# Patient Record
Sex: Female | Born: 1960
Health system: Southern US, Community
[De-identification: ages and names within clinical notes are randomized; demographics above are authoritative.]

## PROBLEM LIST (undated history)

## (undated) DIAGNOSIS — I4891 Unspecified atrial fibrillation: Secondary | ICD-10-CM

## (undated) DIAGNOSIS — I428 Other cardiomyopathies: Secondary | ICD-10-CM

## (undated) DIAGNOSIS — R0600 Dyspnea, unspecified: Secondary | ICD-10-CM

## (undated) DIAGNOSIS — I10 Essential (primary) hypertension: Secondary | ICD-10-CM

## (undated) DIAGNOSIS — E049 Nontoxic goiter, unspecified: Secondary | ICD-10-CM

## (undated) DIAGNOSIS — I5042 Chronic combined systolic (congestive) and diastolic (congestive) heart failure: Secondary | ICD-10-CM

## (undated) HISTORY — PX: BACK SURGERY: SHX140

## (undated) HISTORY — PX: APPENDECTOMY: SHX54

---

## 1997-05-08 ENCOUNTER — Emergency Department (HOSPITAL_COMMUNITY): Admission: EM | Admit: 1997-05-08 | Discharge: 1997-05-08 | Payer: Self-pay | Admitting: Emergency Medicine

## 1997-05-25 ENCOUNTER — Other Ambulatory Visit: Admission: RE | Admit: 1997-05-25 | Discharge: 1997-05-25 | Payer: Self-pay | Admitting: Family Medicine

## 1997-06-21 ENCOUNTER — Encounter: Admission: RE | Admit: 1997-06-21 | Discharge: 1997-06-21 | Payer: Self-pay | Admitting: Family Medicine

## 1997-06-23 ENCOUNTER — Encounter: Admission: RE | Admit: 1997-06-23 | Discharge: 1997-06-23 | Payer: Self-pay | Admitting: Family Medicine

## 1997-06-30 ENCOUNTER — Encounter: Admission: RE | Admit: 1997-06-30 | Discharge: 1997-06-30 | Payer: Self-pay | Admitting: Family Medicine

## 1997-07-19 ENCOUNTER — Encounter: Admission: RE | Admit: 1997-07-19 | Discharge: 1997-07-19 | Payer: Self-pay | Admitting: Family Medicine

## 1997-08-11 ENCOUNTER — Encounter: Admission: RE | Admit: 1997-08-11 | Discharge: 1997-08-11 | Payer: Self-pay | Admitting: Family Medicine

## 1997-10-01 ENCOUNTER — Encounter: Admission: RE | Admit: 1997-10-01 | Discharge: 1997-10-01 | Payer: Self-pay | Admitting: Family Medicine

## 1998-02-21 ENCOUNTER — Ambulatory Visit (HOSPITAL_BASED_OUTPATIENT_CLINIC_OR_DEPARTMENT_OTHER): Admission: RE | Admit: 1998-02-21 | Discharge: 1998-02-21 | Payer: Self-pay | Admitting: Otolaryngology

## 1999-04-08 ENCOUNTER — Emergency Department (HOSPITAL_COMMUNITY): Admission: EM | Admit: 1999-04-08 | Discharge: 1999-04-08 | Payer: Self-pay | Admitting: Emergency Medicine

## 1999-08-23 ENCOUNTER — Emergency Department (HOSPITAL_COMMUNITY): Admission: EM | Admit: 1999-08-23 | Discharge: 1999-08-23 | Payer: Self-pay | Admitting: Emergency Medicine

## 1999-09-13 ENCOUNTER — Ambulatory Visit (HOSPITAL_BASED_OUTPATIENT_CLINIC_OR_DEPARTMENT_OTHER): Admission: RE | Admit: 1999-09-13 | Discharge: 1999-09-13 | Payer: Self-pay

## 2000-08-26 ENCOUNTER — Emergency Department (HOSPITAL_COMMUNITY): Admission: EM | Admit: 2000-08-26 | Discharge: 2000-08-26 | Payer: Self-pay | Admitting: Emergency Medicine

## 2001-03-13 ENCOUNTER — Emergency Department (HOSPITAL_COMMUNITY): Admission: EM | Admit: 2001-03-13 | Discharge: 2001-03-13 | Payer: Self-pay | Admitting: Emergency Medicine

## 2001-03-13 ENCOUNTER — Encounter: Payer: Self-pay | Admitting: Emergency Medicine

## 2002-02-11 ENCOUNTER — Emergency Department (HOSPITAL_COMMUNITY): Admission: EM | Admit: 2002-02-11 | Discharge: 2002-02-11 | Payer: Self-pay | Admitting: Emergency Medicine

## 2003-04-04 ENCOUNTER — Emergency Department (HOSPITAL_COMMUNITY): Admission: EM | Admit: 2003-04-04 | Discharge: 2003-04-04 | Payer: Self-pay | Admitting: Emergency Medicine

## 2005-08-29 ENCOUNTER — Ambulatory Visit: Payer: Self-pay | Admitting: Internal Medicine

## 2005-08-29 ENCOUNTER — Inpatient Hospital Stay (HOSPITAL_COMMUNITY): Admission: EM | Admit: 2005-08-29 | Discharge: 2005-09-01 | Payer: Self-pay | Admitting: Emergency Medicine

## 2005-08-29 ENCOUNTER — Ambulatory Visit: Payer: Self-pay | Admitting: Pulmonary Disease

## 2005-08-29 ENCOUNTER — Encounter: Payer: Self-pay | Admitting: Cardiovascular Disease

## 2005-08-31 ENCOUNTER — Encounter: Payer: Self-pay | Admitting: Pulmonary Disease

## 2005-09-12 ENCOUNTER — Ambulatory Visit: Payer: Self-pay | Admitting: Cardiology

## 2005-10-04 ENCOUNTER — Ambulatory Visit: Payer: Self-pay | Admitting: Internal Medicine

## 2005-10-29 ENCOUNTER — Ambulatory Visit: Payer: Self-pay | Admitting: Internal Medicine

## 2005-12-28 ENCOUNTER — Ambulatory Visit: Payer: Self-pay | Admitting: Internal Medicine

## 2006-02-27 ENCOUNTER — Ambulatory Visit: Payer: Self-pay | Admitting: Internal Medicine

## 2007-06-06 ENCOUNTER — Ambulatory Visit: Payer: Self-pay | Admitting: Internal Medicine

## 2007-09-15 ENCOUNTER — Ambulatory Visit: Payer: Self-pay | Admitting: Internal Medicine

## 2007-09-15 ENCOUNTER — Ambulatory Visit (HOSPITAL_COMMUNITY): Admission: RE | Admit: 2007-09-15 | Discharge: 2007-09-15 | Payer: Self-pay | Admitting: Internal Medicine

## 2007-09-15 ENCOUNTER — Ambulatory Visit: Payer: Self-pay

## 2007-09-15 ENCOUNTER — Encounter: Payer: Self-pay | Admitting: Internal Medicine

## 2007-09-15 LAB — CONVERTED CEMR LAB
BUN: 11 mg/dL (ref 6–23)
Bilirubin, Direct: 0.1 mg/dL (ref 0.0–0.3)
CO2: 29 meq/L (ref 19–32)
Calcium: 9.2 mg/dL (ref 8.4–10.5)
Chloride: 112 meq/L (ref 96–112)
Free T4: 0.7 ng/dL (ref 0.6–1.6)
Glucose, Bld: 107 mg/dL — ABNORMAL HIGH (ref 70–99)
HDL: 41.6 mg/dL (ref >39.0)
LDL Cholesterol: 112 mg/dL — ABNORMAL HIGH (ref 0–99)
Pro B Natriuretic peptide (BNP): 76 pg/mL (ref 0.0–100.0)
Sodium: 144 meq/L (ref 135–145)
TSH: 0.62 microintl units/mL (ref 0.35–5.50)
Total CHOL/HDL Ratio: 4
VLDL: 12 mg/dL (ref 0–40)

## 2008-02-17 ENCOUNTER — Ambulatory Visit: Payer: Self-pay | Admitting: Internal Medicine

## 2008-07-22 ENCOUNTER — Encounter (INDEPENDENT_AMBULATORY_CARE_PROVIDER_SITE_OTHER): Payer: Self-pay | Admitting: *Deleted

## 2008-10-15 ENCOUNTER — Encounter (INDEPENDENT_AMBULATORY_CARE_PROVIDER_SITE_OTHER): Payer: Self-pay | Admitting: *Deleted

## 2010-06-20 NOTE — Assessment & Plan Note (Signed)
Joanna [name]                            CARDIOLOGY OFFICE NOTE   Joanna [name], Joanna [name]                      MRN:          [id number]  DATE:06/06/2007                            DOB:          12-25-1960    PRIMARY CARE PHYSICIAN:  None.   INTERVAL HISTORY:  Joanna [name] is a 50 year old woman with a history of  nonischemic cardiomyopathy with an EF of 30-35% with normal coronaries  on catheterization.  She also has a history of obesity, hypertension and  ongoing tobacco use.   She presents to the clinic today after not being seen here for over a  year-and-a-half due to multiple missed appointments.  We have previously  also scheduled her for an echocardiogram which, unfortunately, she  cancelled that appointment.   She is working as a Engineer, site taking some CNA classes.  She  denies any chest pain.  She says she can walk around at work with no  significant difficulty but if she tries to go too fast or up any hills  she does get short of breath.  She has not any orthopnea or PND.  She  has been having recently a little bit of lower extremity edema because  she ran out of her Lasix.  No chest pain or palpitations.  She is  smoking half a pack of cigarettes a day.   CURRENT MEDICATIONS:  1. Bisoprolol/hydrochlorothiazide 10/6.25.  2. Potassium 20 b.i.d.  3. Lisinopril 40 a day.  4. Lasix 40 a day - been out for 2 days.  5. Multivitamin.   PHYSICAL EXAMINATION:  She is in no acute distress, ambulates around the  clinic without any respiratory difficulty.  Blood pressure is 142/96,  heart rate is 82, weight is 264 which is up 27 pounds from when we last  saw her.  HEENT:  Normal.  NECK:  Supple.  There is no JVD.  Carotids are 2+ bilaterally without  any bruits.  There is no lymphadenopathy.  She appears to have bilateral  thyroid fullness consistent with a goiter.  CARDIAC:  PMI is nondisplaced.  Regular rate and rhythm.  No murmurs,  rubs or  gallops.  LUNGS:  Clear.  ABDOMEN:  Obese, nontender, nondistended.  No hepatosplenomegaly, no  bruits, no mass.  EXTREMITIES:  Warm with no cyanosis or clubbing, 1+ edema.  She does  have healed excoriations from scratching.  No open sores.  Good distal  pulses.  NEURO:  Alert and x3.  Cranial nerves II-XII are intact.  Moves all four  extremities without difficulty.  Affect is pleasant.   EKG shows sinus rhythm at a rate 82 with occasional PVCs.  No ST-T wave  abnormalities.   ASSESSMENT:  1. History of congestive heart failure secondary to nonischemic      cardiomyopathy.  Last assessment of ejection fraction was a couple      of years ago.  I suggested that we repeat her echocardiogram but      she is unsure of her insurance status and does not want to proceed      with this as of  yet.  Clinically, she is NYHA class II with decent      volume status.  We will switch her bisoprolol over to Coreg 12.5      b.i.d. to see if this helps with her blood pressure, continue her      ACE inhibitor and diuretic.  2. Hypertension.  This is suboptimally controlled.  We are      \switching her bisoprolol over to Coreg.  3. Goiter.  I have suggested we get a thyroid ultrasound and a thyroid      panel, but once again she wants to confirm her insurance status.  4. Tobacco use.  I warned her of the problems of smoking and suggested      she quit.   DISPOSITION:  I am quite concerned about [name].  She is not seeking  any primary care follow-up and she has not been consistent with her  follow-up here.  We have suggested that she have some blood work and get  an ultrasound of both at her heart and her thyroid.  She will get back  to [place], she said, when she is ready to do this.     Joanna [name]. Bensimhon, MD  Electronically Signed    DRB/MedQ  DD: 06/06/2007  DT: 06/06/2007  Job #: [id number]

## 2010-06-20 NOTE — Assessment & Plan Note (Signed)
[facility]                            CARDIOLOGY OFFICE NOTE   Joanna [name], Joanna [name]                      MRN:          [id number]  DATE:02/17/2008                            DOB:          May 21, 1960    INTERVAL HISTORY:  Joanna [name] is a 50 year old woman with history of  congestive heart failure secondary to nonischemic cardiomyopathy.  Initial ejection fraction was 30-35% with normal coronaries on  catheterization.  Repeat echocardiogram done in August 2009 showed an EF  of 45% with mildly dilated ventricle.  She also has history of obesity,  hypertension, and ongoing tobacco use.  She has missed multiple  appointments.   The last time we saw her was in May 2009.  She returns today for routine  followup.   Overall, she is doing okay.  She is able to ambulate without significant  difficulty.  She does get short of breath if she does goes too fast.  She has not had any chest pain.  She has lost some weight.  No  orthopnea.  No PND.  No lower extremity edema.  She is smoking half a  pack of cigarettes a day.   On review of systems, she denies any palpitations.  No syncope or  presyncope.  No focal neurologic problems.  Remainder of review of  systems is negative except for HPI and problem list.   CURRENT MEDICATIONS:  1. Lisinopril 40 b.i.d.  2. Coreg 12.55 b.i.d.  3. Multivitamin.   PHYSICAL EXAMINATION:  GENERAL:  She is no acute distress.  She  ambulates around the clinic without any respiratory difficulty.  VITAL SIGNS:  Blood pressure is 160/100, heart rate 75, weight is 244  which is down 20 pounds.  HEENT:  Normal.  NECK:  Supple.  No JVD.  Carotids are 2+ bilaterally without any bruits.  There is no lymphadenopathy.  Question of mild thyroid fullness.  CARDIAC:  PMI is nondisplaced.  Regular rate and rhythm.  No murmurs,  rubs, or gallops.  LUNGS:  Clear.  ABDOMEN:  Obese, nontender, nondistended.  No hepatosplenomegaly.  No  bruits.   No mass.  EXTREMITIES:  Warm with no cyanosis, clubbing, or edema.  Good distal  pulses.  NEUROLOGIC:  Alert and oriented x3.  Cranial nerves II through XII are  intact.  Moves all 4 extremities without difficulty.  Affect is  pleasant.  EKG shows sinus rhythm at a rate of 75 with occasional PVCs.   ASSESSMENT:  1. Congestive heart failure secondary to nonischemic cardiomyopathy.      She is New York Heart Association class II with good volume status.      We will increase her Coreg to 25 b.i.d.  2. Hypertension.  This is suboptimally controlled with doubling her      Coreg.  I think she will probably need spironolactone in the      future.  3. Tobacco use.  I counseled her on the need to stop smoking.  4. Possible goiter.  I discussed this with her previously that she      should get a primary care  physician and consider a thyroid workup.   DISPOSITION:  Return to clinic in 3 months.     [name], MD  Electronically Signed    DRB/MedQ  DD: 02/17/2008  DT: 02/17/2008  Job #: [id number]

## 2010-06-23 NOTE — Assessment & Plan Note (Signed)
[facility]                            CARDIOLOGY OFFICE NOTE   Joanna [name], Joanna [name]                      MRN:          [id number]  DATE:02/27/2006                            DOB:          10/25/1960    PATIENT IDENTIFICATION:  Joanna [name] is a very pleasant 50 year old  woman with non-ischemic cardiomyopathy, an EF of 30-35%, with normal  coronaries, who presents for routine followup.  She also has a history  of hypertension.   Joanna [name] returns today for routine followup.  She says she is doing quite  well.  She is ambulating without an difficulty, denies any chest pain,  shortness of breath.  No orthopnea, no PND, no lower extremity edema.  She says she has been taking all her medications as scheduled, except  every once in a while, she misses her afternoon Lasix dose.   PAST MEDICAL HISTORY:   MEDICATIONS:  1. Lasix 40 b.i.d.  2. Multivitamin.  3. Bisoprolol/HCTZ 10/6.25.  4. Potassium 20 b.i.d.  5. Lisinopril 40 once a day.   PHYSICAL EXAMINATION:  She is well-appearing, ambulates around the  clinic without any respiratory difficulty.  Blood pressure is 116/79,  heart rate is 64, weight is 237.  HEENT:  Sclerae are anicteric.  EOMI.  There are no xanthelasmas.  Mucous membranes are moist.  NECK:  Supple, no JVD.  Carotids are 2+ bilaterally, without any bruits.  There is no lymphadenopathy.  Question of mild thyroid fullness.  CARDIAC:  Regular rate and rhythm, no murmurs, rubs or gallops.  LUNGS:  Clear.  ABDOMEN:  Obese, nontender, nondistended.  No obvious  hepatosplenomegaly, no bruits, no masses.  EXTREMITIES:  Warm with no cyanosis, clubbing or edema.  Good distal  pulses, no rashes.  NEUROLOGIC:  She is alert and oriented times three.  Cranial nerves II  through XII are intact.  Moves all four extremities without difficulty.   The EKG shows normal sinus rhythm with nonspecific STT-waves inferiorly,  which is nonspecific, but is  new from previous.   ASSESSMENT AND PLAN:  Non-ischemic cardiomyopathy.  She is euvolemic,  despite the fact that her weight is up.  She is on good medicines and  her functional capacity is excellent.  I suspect her EF may have  recovered.  I have asked her to decrease her Lasix to 40 mg once a day  and also decrease her potassium.  She can take an extra dose as needed.  We will repeat her echocardiogram to look for LV function recovery.   DISPOSITION:  We will see her back in several months for routine  followup.     Joanna [name]. Bensimhon, MD  Electronically Signed    DRB/MedQ  DD: 02/27/2006  DT: 02/27/2006  Job #: [id number]

## 2010-06-23 NOTE — Discharge Summary (Signed)
NAME:  Joanna [name], Joanna [name] NO.:  [id number]   MEDICAL RECORD NO.:  [id number]          PATIENT TYPE:  INP   LOCATION:  3315                         FACILITY:  [facility]   PHYSICIAN:  Willa Rough, M.D.     DATE OF BIRTH:  09-09-1960   DATE OF ADMISSION:  08/30/2005  DATE OF DISCHARGE:  09/01/2005                                 DISCHARGE SUMMARY   PRIMARY CARDIOLOGIST:  Dr. Arvilla Meres.   PRIMARY CARE PHYSICIAN:  The patient does not have a primary care physician.   PRINCIPAL DIAGNOSIS:  Acute systolic congestive heart failure.   SECONDARY DIAGNOSES:  1.  Nonischemic cardiomyopathy.  2.  Hypertension.  3.  Obesity.   ALLERGIES:  No known drug allergies.   PROCEDURE:  Right and left heart cardiac catheterization and 2-D  echocardiogram.   HISTORY OF PRESENT ILLNESS:  A 50 year old African-American female with no  prior history of coronary artery disease who presented to [facility] on August 29, 2005 with complaints of a three-week history of  worsening dyspnea on exertion and orthopnea.  In the [facility] ED, she was  treated with IV Lasix with improvement and admitted to the ICU by the  primary care service.  Cardiology was subsequently asked to consult after 2-  D echocardiogram revealed a new diagnosis of LV systolic dysfunction with an  EF 30-35%.  Decision was made to transfer to [facility] for further  evaluation.   HOSPITAL COURSE:  We took over the care of Joanna [name] following arrival to  [facility] and she continued to diurese with IV Lasix.  Lasix was switched  over to an oral regimen on August 30, 2005 and she underwent left heart  cardiac catheterization on August 31, 2005 revealing normal coronary arteries  with an EF of 25-30%.  It was noted that her LV was dilated.  Right heart  catheterization revealed moderately elevated right heart pressures with a PA  33/16 with a mean of 24 and a wedge of 25.  Cardiac output was 9.4 with an  index  of 4.2.  It was felt that she likely suffered from hypertensive  nonischemic cardiomyopathy in the absence of coronary disease and she has  been medically managed with beta blocker, ACE inhibitor and Lasix therapy.  Through medications, we have been able to control her blood pressure and  improve her symptomatically.  She has been counseled on the importance of  sodium restriction as well as medical adherence and daily weights.  She is  being discharged home today in satisfactory condition.   DISCHARGE LABS:  Hemoglobin 12.1, hematocrit 36.6, WBC 6.8, platelets 158.  Sodium 142, potassium 3.3 (replaced prior to discharge), chloride 108, CO2  26, BUN 15, creatinine 0.8, glucose 115, calcium 9.1, hemoglobin A1c 5.1.  BNP admission was 335, total cholesterol 122, triglycerides 57, HDL 45, LDL  66.   DISPOSITION:  The patient is being discharged home today in good condition.   FOLLOW-UP APPOINTMENTS:  She has a follow-up appointment with Dr.  Prescott Gum P.A. on September 12, 2005 at 2:15 p.m.  She has  a follow-up in  [facility] cardiology CHF clinic on October 04, 2005 at 2:30 p.m.   DISCHARGE MEDICATIONS:  1.  Lasix 40 mg b.i.d.  2.  Lisinopril 40 mg daily.  3.  Potassium chloride 20 mEq b.i.d.  4.  Bisoprolol 2.5 mg daily.   Outstanding labs are none.  Duration of discharge encounter is 40 minutes  including physician time.      Ok Anis, NP    ______________________________  Willa Rough, M.D.    CRB/MEDQ  D:  09/01/2005  T:  09/01/2005  Job:  [id number]

## 2010-06-23 NOTE — Assessment & Plan Note (Signed)
[facility]                              CARDIOLOGY OFFICE NOTE   Joanna, [name]                      MRN:          [id number]  DATE:10/29/2005                            DOB:          1960/05/01    PATIENT IDENTIFICATION:  Joanna [name] is a very pleasant 50 year old woman  who returns for routine followup of her heart failure.   PAST MEDICAL HISTORY:  1. Congestive heart failure secondary to nonischemic cardiomyopathy,      presumed hypertensive.  Cardiac catheterization showed an EF of 30-35%      with normal coronary arteries.  2. Hypertension.  3. Obesity.  4. Tobacco use, four cigarettes per day.  5. Status post back surgery.   CURRENT MEDICATIONS:  1. Lasix 40 b.i.d.  2. Bisoprolol 10/hydrochlorothiazide 6.25 a day.  3. Potassium 20 mEq b.i.d.  4. Lisinopril 40 a day.  5. Benazepril 40 a day.  6. Multivitamin.   ALLERGIES:  No known drug allergies.   INTERVAL HISTORY:  Joanna [name] returns today for routine followup.  She says  she is doing great.  She is back to work.  She denies any chest pain or  shortness of breath.  She has not had any heart failure symptoms at all.  There is no orthopnea, PND, or lower extremity edema.  She denies any  palpitations, syncope, or presyncope.   PHYSICAL EXAMINATION:  GENERAL:  She is well-appearing, ambulates around the  clinic without any difficulty.  VITAL SIGNS:  Blood pressure is 116/84, heart rate 65, weight is 228.  HEENT:  Sclerae anicteric, EOMI.  There is no xanthelasmas.  Mucous  membranes are moist.  NECK:  Supple.  There is no JVD.  Carotids are 2+ bilaterally without any  bruits.  There is no lymphadenopathy or thyromegaly.  CARDIAC:  Regular rate and rhythm, no S3.  LUNGS:  Clear.  ABDOMEN:  Obese, nontender, nondistended.  No obvious hepatosplenomegaly.  No bruits, no masses.  EXTREMITIES:  Warm with no cyanosis, clubbing or edema.  Strong distal  pulses.  NEUROLOGIC:   Alert and oriented x3.  Cranial nerves II-XII are grossly  intact.  Moves all four extremities without difficulty.   ASSESSMENT AND PLAN:  Congestive heart failure secondary to nonischemic  cardiomyopathy.  She is currently NYHA class I.  She is doing very well.  She is euvolemic.  Unfortunately, she is now taking two ACE inhibitors so I  have asked her to stop the benazepril.  I suspect we may be able to come  down on her diuretics a bit in the near future.  Will see her back in 3  weeks for further evaluation.  At that time we will check electrolytes as  well as a BNP.  She will also need a followup echocardiogram in several  months to look for recovery of her LV function.  If is has not recovered we  may need to consider a defibrillator placement.  [name], MD    DRB/MedQ  DD:  10/29/2005  DT:  10/31/2005  Job #:  [id number]

## 2010-06-23 NOTE — Assessment & Plan Note (Signed)
[facility]                   COUMADIN / CHRONIC HEART FAILURE CLINIC NOTE   LORAL, CAMPI                      MRN:          [id number]  DATE:10/04/2005                            DOB:          [birth year]    Ms. Bourn is a 50 year old African-American female new to the heart  failure clinic.  She is here following a recent hospitalization for new  diagnosis of congestive heart failure secondary to non-ischemic  cardiomyopathy in setting of uncontrolled hypertension.  Ms. Luepke  underwent a cardiac catheterization (right and left heart cath) by Dr. Nicholes Mango during that hospitalization, showing normal coronary arteries with  moderate-to-severe left ventricular dysfunction, suspected hypertensive  cardiomyopathy.  Patient also had a 2.D echocardiogram done at that time  showing ejection fraction of 30 to 35% with moderate diffuse left  ventricular hypokinesis.  Ms. Motz was discharged home on 40 of Lasix  b.i.d., 40 of lisinopril, and 2.5 of metoprolol, along with potassium.  She  was Dr. Tereso Newcomer for initial post hospitalization visit on September 12, 2005.  Patient was found to be in stable condition at that time.  There was  concern about her returning to work.  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  She has not been back to work since that time.  She complains of ongoing  generalized fatigue and dyspnea on exertion.  No resting shortness of  breath; however, she continues to smoke approximately she states less than a  pack per day, which has been doing so for many years.  She states she also  has been told in the past that she had high blood pressure, but has never  gotten her prescription filled.  Ms. Yaden states compliance with her  medications since she was discharged home.  Her only complaint at this time  is the generalized fatigue and  weakness.  Denies any orthopnea, paroxysmal  nocturnal dyspnea, peripheral edema, or chest discomfort.   PAST MEDICAL HISTORY:  Includes:  1. Congestive heart failure new onset secondary to non-ischemic      cardiomyopathy with an EF of 30 to 35% in the setting of uncontrolled      hypertension.  2. Normal coronary arteries by catheterization.  3. Hypertension.  4. Obesity.  5. Ongoing tobacco abuse.  6. Appendectomy  7. Status post back surgery.   REVIEW OF SYMPTOMS:  As stated above in history of present illness.  Otherwise negative.   CURRENT MEDICATIONS:  Include:  1. Furosemide 40 mg p.o. b.i.d.  2. Kay Ciel 20 mEq b.i.d.  3. Lisinopril 40 mg daily.  4. Metoprolol 5 mg daily.   PHYSICAL EXAMINATION:  VITAL SIGNS:  Weight 230 pound, blood pressure 164/98  with a pulse of 76.  GENERAL:  Ms, Klas is a pleasant 50 year old African-American female in  no acute distress.  HEENT:  She is normocephalic, atraumatic.  Pupils equal, round, reactive to  light.  Sclerae is clear.  Neck supple without lymphadenopathy.  Negative  bruits.  Jugular venous distention  approximately 5 to 6 centimeters.  CARDIOVASCULAR:  S1, S2, soft S4.  LUNGS:  Distant breath sounds bilateral bases, otherwise clear to  auscultation.  ABDOMEN:  Soft, nontender.  Positive bowel sounds.  LOWER EXTREMITIES:  Without clubbing, cyanosis, or edema.  NEUROLOGICALLY:  Patient alert and oriented times 3.  Cranial nerves II  through XII grossly intact.   IMPRESSION:  Stable class II to early class III heart failure.  Patient  hypertensive today.  I am going to have her increase her metoprolol to 10 mg  daily.  I have also written her a new prescription for this.  She is  concerned about her financial ability to obtain her medicines. I have given  her new prescriptions so that she can get her medications filled at [facility].  Unfortunately the metoprolol only comes with the HCTZ so we will be  adding 6.25 mg of  the hydrochlorothiazide to her metoprolol.  However, I  think patient will tolerate this adjustment.  I am going to have her return  in 4 weeks for further evaluation.  Will not check blood work at this time,  as she just had lab work at the beginning of this month, and blood urea  nitrogen, creatinine, potassium were stable at that time.  I have given  patient a congestive heart failure information packet regarding, and we have  discussed at length diet, fluid restriction, daily weights, the importance  of medication and when to call our office.  Patient verbalizes  understanding, although I am not sure that she mentally is capable of  grasping the importance of all of this information at this time.  Hopefully,  she will review some of the written literature I have given her.                                 Dorian Pod, ACNP    MB/MedQ  DD:  10/04/2005  DT:  10/05/2005  Job #:  [id number]

## 2010-06-23 NOTE — Assessment & Plan Note (Signed)
[facility]                              CARDIOLOGY OFFICE NOTE   MAEBY, VANKLEECK                      MRN:          [id number]  DATE:09/12/2005                            DOB:          1960/05/08    Joanna [name] is a very pleasant 50 year old female patient with no  previously known coronary artery disease who presented to the hospital on  August 30, 2005 with congestive heart failure.  She had an EF of 30-35% by  echocardiogram.  She underwent cardiac catheterization which revealed normal  coronary arteries.  She was diagnosed with nonischemic cardiomyopathy.  She  had her medications adjusted, and she was discharged home on September 01, 2005.  The patient returns to the office today for follow-up.  She is doing well  without shortness of breath with exertion.  She denies any orthopnea or  paroxysmal nocturnal dyspnea.  She denies any syncope, pre syncope, or chest  pain.  She does note that she is quite fatigued.  She is somewhat concerned  about going back to work.   CURRENT MEDICATIONS:  1.  Furosemide 40 mg b.i.d.  2.  Potassium 20 mEq b.i.d.  3.  Lisinopril 40 mg daily.  4.  Bisoprolol 5 mg half tablet daily.  5.  Multivitamin daily.   ALLERGIES:  No known drug allergies.   PHYSICAL EXAMINATION:  GENERAL:  She is a well-nourished, well-developed  female in no acute distress.  VITAL SIGNS:  Blood pressure is 142/79.  Pulse is 70.  Weight is 228 pounds.  HEENT:  Unremarkable.  NECK:  Without JVD.  CARDIAC:  S1 and S2.  Regular rate and rhythm without murmurs, clicks, rubs,  or gallops.  LUNGS:  Clear to auscultation bilaterally without wheezing, rhonchi, or  rales.  ABDOMEN:  Soft and nontender with normoactive bowel sounds.  No  organomegaly.  EXTREMITIES:  Without edema.  Calves are soft and nontender.  Right groin  without hematomas or bruits.   Electrocardiogram reveals sinus rhythm with a heart rate of 70.  Normal  axis.   Nonspecific ST/T-wave changes.  Occasional PVC.   IMPRESSION:  1.  Chronic systolic congestive heart failure.  2.  Nonischemic cardiomyopathy with an ejection fraction of 30-35%.  3.  Normal coronary arteries by catheterization.  4.  Hypertension.  5.  Obesity.  6.  Tobacco abuse.   PLAN:  The patient did well from a cardiovascular standpoint.  She continues  to be euvolemic on exam and is tolerating her medications well.  She has  been walking quite a bit.  She actually walks to a convenience store several  times a week, and this is a 30-minute walk one way.  She does fine with  this, aside from the fact of being fatigued.  She is still somewhat  concerned about going back to work.  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  I think it is reasonable to allow  her to stay out of work until she follows up with the CHF clinic later this  month.  We will continue to try to titrate her medications and see if we can  improve her symptomatology.  If it is improved by the time of her follow-up,  we could certainly release her to go back to work.  I have increased her  bisoprolol today to 5 mg a day.  We will check a BMET today to follow up on  her renal function and potassium.  She will follow up with Dr. Teena Dunk later  this month as scheduled.                                  Tereso Newcomer, PA-C                            Jesse Sans. Daleen Squibb, MD, [facility]   SW/MedQ  DD:  09/12/2005  DT:  09/12/2005  Job #:  [id number]

## 2010-06-23 NOTE — Op Note (Signed)
[place]. [facility]  Patient:    Joanna [name], Joanna [name]                      MRN: [id number] Proc. Date: 09/13/99 Adm. Date:  [id number] Attending:  Meredith Leeds                           Operative Report  PREOPERATIVE DIAGNOSIS:  Anal fistula.  POSTOPERATIVE DIAGNOSIS:  Anal fistula.  OPERATION:  Anal fistulectomy.  SURGEON:  Zigmund Daniel, M.D.  ANESTHESIA:  General.  PROCEDURE:  After the patient was adequately anesthetized, draped, and positioned, I thoroughly examined the anal area.  There was an opening in the external skin about 2 cm from the anal verge.  I placed a probe into that and it went easily into a crypt in the anus.  I completely opened the fistula using the cautery, and cut away overhanging skin edges to provide good open fistula for healing.  I cauterized the chronic granulation tissue which was present, and I probed for other ramifications of the fistula and did not find any.  I examined the distal rectal mucosa, and the remainder of the anus and found no other abnormalities.  I anesthetized the area thoroughly with long-acting local anesthetic and applied a bandage.  She tolerated the operation well. DD:  09/13/99 TD:  09/13/99 Job: [id number]

## 2010-06-23 NOTE — Cardiovascular Report (Signed)
NAME:  BIRTHA, HATLER NO.:  [id number]   MEDICAL RECORD NO.:  [id number]          PATIENT TYPE:  INP   LOCATION:  3315                         FACILITY:  [facility]   PHYSICIAN:  Arvilla Meres, M.D. LHCDATE OF BIRTH:  February 26, 1960   DATE OF PROCEDURE:  08/31/2005  DATE OF DISCHARGE:                              CARDIAC CATHETERIZATION   IDENTIFICATION:  Ms. Petron is a 50 year old woman with a history of  hypertension who was admitted with respiratory distress.  Echocardiogram  revealed an ejection fraction of approximately 30% with moderate mitral  regurgitation.  She is now referred for diagnostic catheterization to assess  her pressures and also rule out underlying coronary disease.   PROCEDURES PERFORMED:  1.  Right heart catheterization.  2.  Left heart catheterization.  3.  Left left ventriculogram.  4.  Selective coronary angiography.  5.  Angio-Seal closure device.   DESCRIPTION OF PROCEDURE:  Risks and benefits of catheterization were  explained.  Consent was signed and placed in the chart.  Using 1% local  lidocaine, the right groin area was anesthetized.  A 6-French arterial  sheath was placed in the right femoral artery using a modified Seldinger  technique.  Standard catheters including preformed Judkins JL-4, JR-4 and  angled pigtail used for catheterization.  All catheter exchanges were made  over wire.  There were no apparent complications.  A 7-French venous sheath  was placed in the right femoral vein using modified Seldinger technique.  Standard Swan-Ganz catheter was used for the procedure.  At the end the  procedure, the patient had her right femoral arteriotomy site closed with  Angio-Seal closure device, and there was good hemostasis.   HEMODYNAMIC RESULTS:  Right atrial pressure mean of 10, RV pressure 46/9, PA  pressure 33/16 with a mean of 24, pulmonary capillary wedge pressure mean of  25, V-wave equals 35, central aortic pressure  140/102 with a mean of 119.  LV pressure is 141/16 with an EDP of 25.  Fick cardiac output was 9.4 liters  per minute.  Fick cardiac index was 4.2 liters per minute per meter squared.  Pulmonary arterial saturations on room air were 63% and 59%.  Aortic  saturation 89% and 86% on room air.  IVC sat was 60%.   Left main was normal.   LAD was a long vessel wrapping the apex, gave off two moderate-sized  diagonals, and was angiographically normal.  Left circumflex was a large  system and had 2 moderate size OMs and a moderate-sized posterolateral.  They were angiographically normal.  Right coronary was a large dominant  vessel that gave off a RV branch and acute marginal, a moderate size PDA and  a moderate-sized posterolateral angiographically normal.   The left ventriculogram done in the RAO position showed a dilated ventricle  with an EF of 25-30%.  There is apparent 1+ mitral regurgitation.   ASSESSMENT:  1.  Normal coronary arteries.  2.  Moderate to severe left ventricular dysfunction, suspect hypertensive      cardiomyopathy.  3.  Resting hypoxemia after a mild amount of sedative.  4.  Elevated cardiac output without any evidence of shunt by oximetry, mild      mitral regurgitation.   PLAN/DISCUSSION:  1.  I suspect this is a hypertensive cardiomyopathy.  Wedge pressure is      still mildly elevated.  We will focus on titrating her ACE inhibitors,      beta blockers and diuretics.  2.  She will need an ambulatory ABG to evaluate for possible need for home      O2.  3.  She will need aggressive control of her blood pressure.  4.  Probable home in the morning, and she will need close follow-up in CHF      clinic.      Arvilla Meres, M.D. [facility]  Electronically Signed     DB/MEDQ  D:  08/31/2005  T:  08/31/2005  Job:  [id number]

## 2010-06-23 NOTE — H&P (Signed)
NAME:  Joanna [name], Joanna [name] NO.:  [id number]   MEDICAL RECORD NO.:  [id number]          PATIENT TYPE:  EMS   LOCATION:  ED                           FACILITY:  [facility]   PHYSICIAN:  Gailen Shelter, MD  DATE OF BIRTH:  27-Aug-1960   DATE OF ADMISSION:  08/29/2005  DATE OF DISCHARGE:                                HISTORY & PHYSICAL   CHIEF COMPLAINT:  Shortness of breath with associated respiratory distress.   HISTORY OF PRESENT ILLNESS:  Ms. Laatsch is a 50 year old African-American  female who presented to [facility] Emergency Room via EMS after complaining  of dyspnea while at work.  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  She apparently is a smoker.  Currently, on my evaluation, the patient is on  BiPAP and is not able to speak clearly due to the BiPAP.  The patient  apparently called EMS today after noticing acute dyspnea, which was noted  during the course of her work today.  EMS was notified and the patient  apparently was in an apprehensive state with oxygen saturations at 100% on  initial evaluation.  Initially, her chest sounded clear per EMS.  On arrival  to the emergency room at [facility], the patient was noted, however, to  have a blood pressure of 140/106.  She was clearly working hard to breathe  and her oxygen saturation was 86%.  At this point, she was initiated on Solu-  Medrol and continuous albuterol nebulizers, none which helped.  Her blood  gases at that time were 7.14, pCO2 of 61 and a pAO2 of 57.  I was notified  by the emergency room physician that the patient was in almost need of  intubation.  After discussion with the ED physician and discussion of the  findings on chest x-ray, I advised that Lasix be given and that BiPAP be  initiated.  I then presented to the patient's bedside.  The patient is a  somewhat vague historian and again, she is wearing BiPAP, quite diaphoretic,  though appears more comfortable on BiPAP, according to the  ED physician.  No  prodrome of cough, sputum production, chest pain or any other symptomatology  could be elicited from the patient.  This was clearly an acute event.   The patient apparently did give to the emergency room personnel a history of  having been to a physician approximately 2 weeks ago; this was in [place], [place] she resides.  She cannot remember the name of the physician.  She also cannot remember the names of the medications that were given to  her.  Apparently, she received prescriptions for 4 different medications.  She was not sure if one of the medications included an inhaler.   PAST MEDICAL HISTORY:  Essentially unremarkable.  The patient has had an  anal fissure in the past which was surgically repaired, but other than that,  an essentially unremarkable history.   ALLERGIES:  None noted.   CURRENT MEDICATIONS:  The patient is unsure of her medications, but these  have been as recent as 2  weeks previously and the patient has not been  compliant.   SOCIAL HISTORY:  The patient works as a Runner, broadcasting/film/video in a Nurse, adult home.  She is reportedly a smoker, though is evasive about quantitation of the  same.  She denies illicit drug use.   FAMILY HISTORY AND REVIEW OF SYSTEMS:  Cannot be obtained nor elucidated  further.   PHYSICAL EXAM:  GENERAL:  This reveals an obese [place] American female who  is quite diaphoretic, though currently with no increased work of breathing  with assistance of BiPAP.  VITAL SIGNS:  Blood pressure is 140/106, pulse is 120, respiratory rate of  20, oxygen saturation is 96% on BiPAP.  Her current FIO2 is at 50%.  HEENT:  Examination is grossly unremarkable.  NECK:  Supple.  T he patient has full neck veins, but no frank JVD.  LUNGS:  The patient has some end-expiratory wheezes in the upper lobes, but  most remarkably, she has crackles bibasilarly at the bases.  CARDIAC:  Tachycardiac.  She had an S4 gallop, no murmurs noted.  ABDOMEN:   Obese.  No hepatosplenomegaly noted.  EXTREMITIES:  The patient has 1 to 2+ pitting edema.  NEUROLOGIC:  Examination is grossly nonfocal.  SKIN:  Cool, clammy and the patient is profusely diaphoretic.   LABORATORY DATA:  Again, reportedly, arterial blood gas was 7.14, pCO2 of  61, pAO2 of 57.   Chest x-ray revealed interstitial edema with cardiomegaly, globular  silhouette, query pericardial effusion.   CBC revealed a white count of 10.4, H&H of 13.9 and 43.8, platelet count of  230,000; the patient does not have a left shift.  Coags are normal.  BMET  reveals a sodium of 144, potassium of 4.5, chloride of 114, carbon dioxide  of 22, glucose of 223, BUN of 14, creatinine of 0.7.  She has mild  transaminase elevations.  Her GOT is 90, SGPT 52.   EKG reveals a sinus tachycardia with left atrial enlargement, nonspecific ST  wave abnormality.   IMPRESSION:  1. Hypercarbic respiratory failure with acute respiratory distress, sudden      in onset in a patient with pulmonary edema, possibly secondary to      diastolic dysfunction.  2. Diastolic hypertension with likely diastolic dysfunction.  3. Hyperglycemia.  4. Tobacco abuse.   PLAN:  1. Plan will be to admit the patient to the intensive care unit.  We will      place the patient on IV nitroglycerin drip.  The patient will be      initiated on aspirin.  She has already received a dose of Lasix, which      clearly helped her along with BiPAP.  2. We will continue BiPAP and try to wean this off as the patient      tolerates.  3. The patient will have other laboratory data obtained to include cardiac      enzymes, urine drug screen, BNP, thyroid panel, magnesium level,      hemoglobin A1c and a sed rate and LDH.  4. A 2-D echo will be obtained to exclude pericardial effusion and      evaluate for potential left ventricular dysfunction.  5.  We will also     consider a CT scan of the chest if above studies are not helpful.  5. For  now we will hold off on the use of anticoagulants until a      pericardial effusion can be excluded.  6. Further plans  pending the patient's response to the above.   CRITICAL CARE TIME:  Please note that a total of 45 minutes of critical care  time were spent in evaluation and stabilization of this patient.      Gailen Shelter, MD  Electronically Signed    CLG/MEDQ  D:  08/29/2005  T:  08/29/2005  Job:  [phone]

## 2010-06-23 NOTE — Consult Note (Signed)
NAME:  Joanna [name], Joanna [name] NO.:  [id number]   MEDICAL RECORD NO.:  [id number]          PATIENT TYPE:  INP   LOCATION:  0165                         [facility]ACILITY:  [facility]   PHYSICIAN:  Arvilla Meres, M.D. [facility] OF BIRTH:  September 19, 1960   DATE OF CONSULTATION:  08/29/2005  DATE OF DISCHARGE:                                   CONSULTATION   CARDIOLOGY CONSULTATION:   PRIMARY CARE PHYSICIAN:  The patient does not have a primary care physician.   SUMMARY OF HISTORY:  Joanna [name] is a 50 year old African-American female  who was transported via EMS to [facility] Emergency Room secondary to  shortness of breath and was admitted by pulmonary.  Joanna [name] describes at  least a 3-week history of shortness of breath and it is particularly worse  with exertion and with lying down.  She denies any edema.  She is not sure  about weight gain and denies chest discomfort.  She stated that she was seen  approximately 2 weeks ago at [facility] ER and remained overnight.  She is  not sure of her discharge diagnoses or the prescription she was given at the  time of discharge.  She does state that she has not been taking all the  medications since she was discharged.  She states for the first 2 days after  that hospitalization she felt okay however then her symptoms returned and  progressively gotten worse.  Last night while working third shift she stated  that her shortness of breath got so bad to the point that she could not  stand or catch her breath her co-worker called EMS.  Upon arrival EMS found  her to be hypertensive and tachycardic and she was placed on oxygen and  transported to [facility] Emergency Room.  In [facility]  her blood gases showed a pH of 7.14, pCO2 of 61, and a pO2 of 57.  BiPAP and  Lasix were administered with gradual improvement.  She was admitted to the  ICU and we were asked to consult secondary to abnormal echocardiogram.   PAST MEDICAL  HISTORY:   ALLERGIES:  NO KNOWN DRUG ALLERGIES.   MEDICATIONS:  Medications prior to admission include occasional BC Powder.  The patient does not know her prescriptions from [facility] as she  did not fill some of them.   SURGICAL HISTORY:  Her surgical history is notable for appendectomy, back  surgery, anal fistula repair in August 2001 and surgery on her left ear.  She states that she was told she had hypertension at [facility].  She denies  any known history of CVA, myocardial infarction, diabetes, COPD, blood  dyscrasias, , thyroid disorder or cholesterol problems, however, she has not  seen a physician in many years.   SOCIAL HISTORY:  She resides in [place] in an apartment, occasionally her  boyfriend or nephew stays with her.  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  She has one son age 31 alive and well and one  grandchild.  She smokes less than a pack  per day and has been doing so for  20 years, drinks one or two beers per week, denies any drugs, herbal  medications, does not follow a diet nor does she exercise.   FAMILY HISTORY:  Family history is notable for the death of her mother at  the age of 18 with cancer, history of hypertension; her father died in his  79s with heart problems, history of hypertension.  She has three sisters,  four brothers, many have hypertension, one brother has probable heart  problems.   REVIEW OF SYSTEMS:  Review of systems notable for denying a recent viral  illness, positive for reading glasses, positive for coughing and wheezing  without production, nocturia, shoulder and back arthralgias, constipation,  last period was end of June/beginning of July.   PHYSICAL EXAMINATION:  GENERAL:  Well-nourished, well-developed obese  African-American female in no apparent distress.  VITAL SIGNS:  Temperature 98.8, blood pressure 152/90, weight 109.9 kg,  pulse 96, respirations 18, 99% on 2 L.  HEENT:  Unremarkable.   NECK:  Supple without adenopathy.  She does have approximately 8-9 cm of JVD  with a positive hepatojugular reflex.  There is also possible goiter.  HEART:  PMI is not displaced.  Rapid rate and rhythm.  Positive S3 and S4.  She has a very faint 1-2/6 systolic murmur at the apex.  LUNGS:  Symmetrical excursion, diminished breath sounds, do not appreciate  any wheezing or rales.  ABDOMEN:  Obese, bowel sounds present without organomegaly, masses, or  tenderness.  EXTREMITIES:  Negative cyanosis, clubbing, or edema.  Peripheral pulses are  symmetrical and intact.  MUSCULOSKELETAL:  Unremarkable.  NEURO:  Intact.  SKIN:  Skin integument is intact.  She does have two names on her left chest  and a tattoo on her upper back.   X-RAYS:  Chest x-ray showed cardiomegaly, moderate edema.   ELECTROCARDIOGRAM:  EKG on admission showed sinus tachycardia, left axis  deviation with a ventricular rate of 142, baseline artifact, left anterior  fascicular block, early R wave, normal intervals.   LABORATORIES:  H&H were 13.9 and 43.8, normal indices, platelets are 230,  WBC is 10.4.  Sodium 144, potassium 4.5, BUN 14, creatinine 0.7, glucose  223.  AST and ALT were elevated at 90 and 52.  BNP was 335.  ESR 2.  Magnesium 1.8.  CK-MBs were negative x2, troponins were 0.12 and 0.10.  PTT  35, PT 14.1, INR 1.0.  UA was positive for wbc's and protein.  TSH was 0.628  with a T4 of 0.78.  Urine drug screen was negative.  Repeat ABG showed a pH  of 7.35, pCO2 40.7, pO2 102, bicarb 21.7, 97.3% 2 liters.   IMPRESSION:  1.  Congestive heart failure with echocardiogram that shows an ejection      fraction of 30-35% with diffuse hypokinesis, moderate mitral      regurgitation.  2.  Hypertension.  3.  Tobacco use.  4.  Obesity.  5.  Hyperglycemia without history of diabetes however, she did receive Solu-      Medrol in the emergency room.  DISPOSITION:  Dr. Gala Romney reviewed the patient's history, spoke with  and  examined the patient.  I have faxed a waiver to [facility] to  receive information in regards to her recent admission.  We will begin her  on a baby aspirin every day, continue her Lovenox, IV nitroglycerin.  We  will begin Captopril 6.25 mg q.8, Lasix 40 mg p.o.  b.i.d. and a beta blocker  in 1-2 days.  We have set up a right and left heart catheterization for  Friday to further  evaluate her probable dilated cardiomyopathy.  We will also do a thyroid  ultrasound for possible goiter.  Dr. Gala Romney spoke with Dr. Jayme Cloud who  will transfer the patient to our service and we will try to get a step-down  bed at [facility] to facilitate her cardiac evaluation.      Joellyn Rued, P.A. [facility]      Arvilla Meres, M.D. [facility]  Electronically Signed    EW/MEDQ  D:  08/29/2005  T:  08/29/2005  Job:  [id number]

## 2011-06-18 ENCOUNTER — Encounter (HOSPITAL_COMMUNITY): Payer: Self-pay | Admitting: Emergency Medicine

## 2011-06-18 ENCOUNTER — Emergency Department (HOSPITAL_COMMUNITY)
Admission: EM | Admit: 2011-06-18 | Discharge: 2011-06-19 | Disposition: A | Discharge status: Home / Self Care | Payer: Self-pay | Attending: Emergency Medicine | Admitting: Emergency Medicine

## 2011-06-18 DIAGNOSIS — L509 Urticaria, unspecified: Secondary | ICD-10-CM | POA: Insufficient documentation

## 2011-06-18 DIAGNOSIS — L2989 Other pruritus: Secondary | ICD-10-CM | POA: Insufficient documentation

## 2011-06-18 DIAGNOSIS — I10 Essential (primary) hypertension: Secondary | ICD-10-CM | POA: Insufficient documentation

## 2011-06-18 DIAGNOSIS — L298 Other pruritus: Secondary | ICD-10-CM | POA: Insufficient documentation

## 2011-06-18 DIAGNOSIS — T7840XA Allergy, unspecified, initial encounter: Secondary | ICD-10-CM

## 2011-06-18 DIAGNOSIS — F17200 Nicotine dependence, unspecified, uncomplicated: Secondary | ICD-10-CM | POA: Insufficient documentation

## 2011-06-18 HISTORY — DX: Essential (primary) hypertension: I10

## 2011-06-18 NOTE — ED Notes (Signed)
PT. REPORTS GENERALIZED ITCHY RASHES/ HIVES ONSET LAST WEEK , RESPIRATIONS UNLABORED .

## 2011-06-19 MED ORDER — DIPHENHYDRAMINE HCL 25 MG PO TABS: 25.0000 mg | ORAL_TABLET | Freq: Four times a day (QID) | ORAL | Status: DC

## 2011-06-19 MED ORDER — DIPHENHYDRAMINE HCL 25 MG PO CAPS
50.0000 mg | ORAL_CAPSULE | Freq: Once | ORAL | Status: AC
  Administered 2011-06-19: 50 mg via ORAL
  Filled 2011-06-19: qty 2

## 2011-06-19 MED ORDER — HYDROCORTISONE 1 % EX CREA: TOPICAL_CREAM | CUTANEOUS | Status: DC

## 2011-06-19 NOTE — ED Notes (Signed)
Patient is AOx4 and comfortable with her discharge instructions. 

## 2011-06-19 NOTE — Discharge Instructions (Signed)

## 2011-06-19 NOTE — ED Provider Notes (Signed)
History     CSN: [id number]  Arrival date & time 06/18/11  2317   First MD Initiated Contact with Patient 06/19/11 0020      Chief Complaint  Patient presents with  . Urticaria     The history is provided by the patient.   patient reports probably one half weeks of "itchy bumps" all over her body but primarily on her upper extremities and her back.  She recently moved into a hotel about a week and half ago when the symptoms started.  She has no pets.  She is not aware of any insect exposures.  She has no history of asthma or eczema.  Her symptoms are moderate to severe this time.  She's not tried any medications.  She has no other complaints.  Past Medical History  Diagnosis Date  . Hypertension     History reviewed. No pertinent past surgical history.  No family history on file.  History  Substance Use Topics  . Smoking status: Current Everyday Smoker  . Smokeless tobacco: Not on file  . Alcohol Use: Yes    OB History    Grav Para Term Preterm Abortions TAB SAB Ect Mult Living                  Review of Systems  All other systems reviewed and are negative.    Allergies  Review of patient's allergies indicates no known allergies.  Home Medications   Current Outpatient Rx  Name Route Sig Dispense Refill  . DIPHENHYDRAMINE HCL 25 MG PO TABS Oral Take 1 tablet (25 mg total) by mouth every 6 (six) hours. 20 tablet 0  . HYDROCORTISONE 1 % EX CREA  Apply to affected area 2 times daily 15 g 0    BP 185/96  Pulse 73  Temp(Src) 98.4 F (36.9 C) (Oral)  Resp 19  SpO2 98%  LMP 06/10/2011  Physical Exam  Constitutional: She is oriented to person, place, and time. She appears well-developed and well-nourished.  HENT:  Head: Normocephalic.  Eyes: EOM are normal.  Neck: Normal range of motion.  Pulmonary/Chest: Effort normal.  Musculoskeletal: Normal range of motion.  Neurological: She is alert and oriented to person, place, and time.  Skin: Skin is warm and  dry.       Several areas of raised erythematous bumps on her bilateral upper extremities and her back.  Has evidence of healing wounds as well as excoriations.  There is no pustules noted.  There is no spreading erythema.  There is no drainage  Psychiatric: She has a normal mood and affect.    ED Course  Procedures (including critical care time)  Labs Reviewed - No data to display No results found.   1. Allergic reaction       MDM  I suspect this is an allergic reaction to some sort of insect/flea/bed bug bites.  I recommended hydrocortisone cream and Benadryl        Lyanne Co, MD 06/19/11 [phone]

## 2011-08-02 ENCOUNTER — Encounter (HOSPITAL_COMMUNITY): Payer: Self-pay | Admitting: Emergency Medicine

## 2011-08-02 ENCOUNTER — Emergency Department (HOSPITAL_COMMUNITY): Payer: Self-pay

## 2011-08-02 ENCOUNTER — Emergency Department (HOSPITAL_COMMUNITY)
Admission: EM | Admit: 2011-08-02 | Discharge: 2011-08-02 | Disposition: A | Discharge status: Home / Self Care | Payer: Self-pay | Attending: Emergency Medicine | Admitting: Emergency Medicine

## 2011-08-02 DIAGNOSIS — I10 Essential (primary) hypertension: Secondary | ICD-10-CM | POA: Insufficient documentation

## 2011-08-02 DIAGNOSIS — I252 Old myocardial infarction: Secondary | ICD-10-CM | POA: Insufficient documentation

## 2011-08-02 DIAGNOSIS — H6120 Impacted cerumen, unspecified ear: Secondary | ICD-10-CM | POA: Insufficient documentation

## 2011-08-02 DIAGNOSIS — F17200 Nicotine dependence, unspecified, uncomplicated: Secondary | ICD-10-CM | POA: Insufficient documentation

## 2011-08-02 DIAGNOSIS — R42 Dizziness and giddiness: Secondary | ICD-10-CM | POA: Insufficient documentation

## 2011-08-02 MED ORDER — LISINOPRIL 20 MG PO TABS: 20.0000 mg | ORAL_TABLET | Freq: Every day | ORAL | Status: DC

## 2011-08-02 MED ORDER — LISINOPRIL 20 MG PO TABS
20.0000 mg | ORAL_TABLET | Freq: Once | ORAL | Status: AC
  Administered 2011-08-02: 20 mg via ORAL
  Filled 2011-08-02: qty 1

## 2011-08-02 MED ORDER — MECLIZINE HCL 50 MG PO TABS: 50.0000 mg | ORAL_TABLET | Freq: Three times a day (TID) | ORAL | Status: AC | PRN

## 2011-08-02 NOTE — Discharge Instructions (Signed)
Arterial Hypertension Arterial hypertension (high blood pressure) is a condition of elevated pressure in your blood vessels. Hypertension over a long period of time is a risk factor for strokes, heart attacks, and heart failure. It is also the leading cause of kidney (renal) failure.  CAUSES   In Adults -- Over 90% of all hypertension has no known cause. This is called essential or primary hypertension. In the other 10% of people with hypertension, the increase in blood pressure is caused by another disorder. This is called secondary hypertension. Important causes of secondary hypertension are:   Heavy alcohol use.   Obstructive sleep apnea.   Hyperaldosterosim (Conn's syndrome).   Steroid use.   Chronic kidney failure.   Hyperparathyroidism.   Medications.   Renal artery stenosis.   Pheochromocytoma.   Cushing's disease.   Coarctation of the aorta.   Scleroderma renal crisis.   Licorice (in excessive amounts).   Drugs (cocaine, methamphetamine).  Your caregiver can explain any items above that apply to you.  In Children -- Secondary hypertension is more common and should always be considered.   Pregnancy -- Few women of childbearing age have high blood pressure. However, up to 10% of them develop hypertension of pregnancy. Generally, this will not harm the woman. It Even be a sign of 3 complications of pregnancy: preeclampsia, HELLP syndrome, and eclampsia. Follow up and control with medication is necessary.  SYMPTOMS   This condition normally does not produce any noticeable symptoms. It is usually found during a routine exam.   Malignant hypertension is a late problem of high blood pressure. It Monger have the following symptoms:   Headaches.   Blurred vision.   End-organ damage (this means your kidneys, heart, lungs, and other organs are being damaged).   Stressful situations can increase the blood pressure. If a person with normal blood pressure has their blood  pressure go up while being seen by their caregiver, this is often termed "white coat hypertension." Its importance is not known. It Alkire be related with eventually developing hypertension or complications of hypertension.   Hypertension is often confused with mental tension, stress, and anxiety.  DIAGNOSIS  The diagnosis is made by 3 separate blood pressure measurements. They are taken at least 1 week apart from each other. If there is organ damage from hypertension, the diagnosis Lemire be made without repeat measurements. Hypertension is usually identified by having blood pressure readings:  Above 140/90 mmHg measured in both arms, at 3 separate times, over a couple weeks.   Over 130/80 mmHg should be considered a risk factor and Grisanti require treatment in patients with diabetes.  Blood pressure readings over 120/80 mmHg are called "pre-hypertension" even in non-diabetic patients. To get a true blood pressure measurement, use the following guidelines. Be aware of the factors that can alter blood pressure readings.  Take measurements at least 1 hour after caffeine.   Take measurements 30 minutes after smoking and without any stress. This is another reason to quit smoking - it raises your blood pressure.   Use a proper cuff size. Ask your caregiver if you are not sure about your cuff size.   Most home blood pressure cuffs are automatic. They will measure systolic and diastolic pressures. The systolic pressure is the pressure reading at the start of sounds. Diastolic pressure is the pressure at which the sounds disappear. If you are elderly, measure pressures in multiple postures. Try sitting, lying or standing.   Sit at rest for a minimum of   5 minutes before taking measurements.   You should not be on any medications like decongestants. These are found in many cold medications.   Record your blood pressure readings and review them with your caregiver.  If you have hypertension:  Your caregiver  may do tests to be sure you do not have secondary hypertension (see "causes" above).   Your caregiver may also look for signs of metabolic syndrome. This is also called Syndrome X or Insulin Resistance Syndrome. You may have this syndrome if you have type 2 diabetes, abdominal obesity, and abnormal blood lipids in addition to hypertension.   Your caregiver will take your medical and family history and perform a physical exam.   Diagnostic tests may include blood tests (for glucose, cholesterol, potassium, and kidney function), a urinalysis, or an EKG. Other tests may also be necessary depending on your condition.  PREVENTION  There are important lifestyle issues that you can adopt to reduce your chance of developing hypertension:  Maintain a normal weight.   Limit the amount of salt (sodium) in your diet.   Exercise often.   Limit alcohol intake.   Get enough potassium in your diet. Discuss specific advice with your caregiver.   Follow a DASH diet (dietary approaches to stop hypertension). This diet is rich in fruits, vegetables, and low-fat dairy products, and avoids certain fats.  PROGNOSIS  Essential hypertension cannot be cured. Lifestyle changes and medical treatment can lower blood pressure and reduce complications. The prognosis of secondary hypertension depends on the underlying cause. Many people whose hypertension is controlled with medicine or lifestyle changes can live a normal, healthy life.  RISKS AND COMPLICATIONS  While high blood pressure alone is not an illness, it often requires treatment due to its short- and long-term effects on many organs. Hypertension increases your risk for:  CVAs or strokes (cerebrovascular accident).   Heart failure due to chronically high blood pressure (hypertensive cardiomyopathy).   Heart attack (myocardial infarction).   Damage to the retina (hypertensive retinopathy).   Kidney failure (hypertensive nephropathy).  Your caregiver can  explain list items above that apply to you. Treatment of hypertension can significantly reduce the risk of complications. TREATMENT   For overweight patients, weight loss and regular exercise are recommended. Physical fitness lowers blood pressure.   Mild hypertension is usually treated with diet and exercise. A diet rich in fruits and vegetables, fat-free dairy products, and foods low in fat and salt (sodium) can help lower blood pressure. Decreasing salt intake decreases blood pressure in a 1/3 of people.   Stop smoking if you are a smoker.  The steps above are highly effective in reducing blood pressure. While these actions are easy to suggest, they are difficult to achieve. Most patients with moderate or severe hypertension end up requiring medications to bring their blood pressure down to a normal level. There are several classes of medications for treatment. Blood pressure pills (antihypertensives) will lower blood pressure by their different actions. Lowering the blood pressure by 10 mmHg may decrease the risk of complications by as much as 25%. The goal of treatment is effective blood pressure control. This will reduce your risk for complications. Your caregiver will help you determine the best treatment for you according to your lifestyle. What is excellent treatment for one person, may not be for you. HOME CARE INSTRUCTIONS   Do not smoke.   Follow the lifestyle changes outlined in the "Prevention" section.   If you are on medications, follow the directions   carefully. Blood pressure medications must be taken as prescribed. Skipping doses reduces their benefit. It also puts you at risk for problems.   Follow up with your caregiver, as directed.   If you are asked to monitor your blood pressure at home, follow the guidelines in the "Diagnosis" section above.  SEEK MEDICAL CARE IF:   You think you are having medication side effects.   You have recurrent headaches or lightheadedness.     You have swelling in your ankles.   You have trouble with your vision.  SEEK IMMEDIATE MEDICAL CARE IF:   You have sudden onset of chest pain or pressure, difficulty breathing, or other symptoms of a heart attack.   You have a severe headache.   You have symptoms of a stroke (such as sudden weakness, difficulty speaking, difficulty walking).  MAKE SURE YOU:   Understand these instructions.   Will watch your condition.   Will get help right away if you are not doing well or get worse.  Document Released: 01/22/2005 Document Revised: 01/11/2011 Document Reviewed: 08/22/2006 [facility] Patient Information 2012 [place], [place].Dizziness Dizziness is a common problem. It is a feeling of unsteadiness or lightheadedness. You may feel like you are about to faint. Dizziness can lead to injury if you stumble or fall. A person of any age group can suffer from dizziness, but dizziness is more common in older adults. CAUSES  Dizziness can be caused by many different things, including:  Middle ear problems.   Standing for too long.   Infections.   An allergic reaction.   Aging.   An emotional response to something, such as the sight of blood.   Side effects of medicines.   Fatigue.   Problems with circulation or blood pressure.   Excess use of alcohol, medicines, or illegal drug use.   Breathing too fast (hyperventilation).   An arrhythmia or problems with your heart rhythm.   Low red blood cell count (anemia).   Pregnancy.   Vomiting, diarrhea, fever, or other illnesses that cause dehydration.   Diseases or conditions such as Parkinson's disease, high blood pressure (hypertension), diabetes, and thyroid problems.   Exposure to extreme heat.  DIAGNOSIS  To find the cause of your dizziness, your caregiver may do a physical exam, lab tests, radiologic imaging scans, or an electrocardiography test (ECG).  TREATMENT  Treatment of dizziness depends on the cause of your  symptoms and can vary greatly. HOME CARE INSTRUCTIONS   Drink enough fluids to keep your urine clear or pale yellow. This is especially important in very hot weather. In the elderly, it is also important in cold weather.   If your dizziness is caused by medicines, take them exactly as directed. When taking blood pressure medicines, it is especially important to get up slowly.   Rise slowly from chairs and steady yourself until you feel okay.   In the morning, first sit up on the side of the bed. When this seems okay, stand slowly while holding onto something until you know your balance is fine.   If you need to stand in one place for a long time, be sure to move your legs often. Tighten and relax the muscles in your legs while standing.   If dizziness continues to be a problem, have someone stay with you for a day or two. Do this until you feel you are well enough to stay alone. Have the person call your caregiver if he or she notices changes in you that  are concerning.   Do not drive or use heavy machinery if you feel dizzy.  SEEK IMMEDIATE MEDICAL CARE IF:   Your dizziness or lightheadedness gets worse.   You feel nauseous or vomit.   You develop problems with talking, walking, weakness, or using your arms, hands, or legs.   You are not thinking clearly or you have difficulty forming sentences. It may take a friend or family member to determine if your thinking is normal.   You develop chest pain, abdominal pain, shortness of breath, or sweating.   Your vision changes.   You notice any bleeding.   You have side effects from medicine that seems to be getting worse rather than better.  MAKE SURE YOU:   Understand these instructions.   Will watch your condition.   Will get help right away if you are not doing well or get worse.  Document Released: 07/18/2000 Document Revised: 01/11/2011 Document Reviewed: 08/11/2010 [facility] Patient Information 2012 [place],  [place].Hypertension Information As your heart beats, it forces blood through your arteries. This force is your blood pressure. If the pressure is too high, it is called hypertension (HTN) or high blood pressure. HTN is dangerous because you may have it and not know it. High blood pressure may mean that your heart has to work harder to pump blood. Your arteries may be narrow or stiff. The extra work puts you at risk for heart disease, stroke, and other problems.  Blood pressure consists of two numbers, a higher number over a lower, 110/72, for example. It is stated as "110 over 72." The ideal is below 120 for the top number (systolic) and under 80 for the bottom (diastolic).  You should pay close attention to your blood pressure if you have certain conditions such as:  Heart failure.   Prior heart attack.   Diabetes   Chronic kidney disease.   Prior stroke.   Multiple risk factors for heart disease.  To see if you have HTN, your blood pressure should be measured while you are seated with your arm held at the level of the heart. It should be measured at least twice. A one-time elevated blood pressure reading (especially in the Emergency Department) does not mean that you need treatment. There may be conditions in which the blood pressure is different between your right and left arms. It is important to see your caregiver soon for a recheck. Most people have essential hypertension which means that there is not a specific cause. This type of high blood pressure may be lowered by changing lifestyle factors such as:  Stress.   Smoking.   Lack of exercise.   Excessive weight.   Drug/tobacco/alcohol use.   Eating less salt.  Most people do not have symptoms from high blood pressure until it has caused damage to the body. Effective treatment can often prevent, delay or reduce that damage. TREATMENT  Treatment for high blood pressure, when a cause has been identified, is directed at the cause.  There are a large number of medications to treat HTN. These fall into several categories, and your caregiver will help you select the medicines that are best for you. Medications may have side effects. You should review side effects with your caregiver. If your blood pressure stays high after you have made lifestyle changes or started on medicines,   Your medication(s) may need to be changed.   Other problems may need to be addressed.   Be certain you understand your prescriptions, and know  how and when to take your medicine.   Be sure to follow up with your caregiver within the time frame advised (usually within two weeks) to have your blood pressure rechecked and to review your medications.   If you are taking more than one medicine to lower your blood pressure, make sure you know how and at what times they should be taken. Taking two medicines at the same time can result in blood pressure that is too low.  Document Released: 03/27/2005 Document Revised: 10/04/2010 Document Reviewed: 04/03/2007 [facility] Patient Information 2012 [place], [place].

## 2011-08-02 NOTE — ED Notes (Addendum)
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

## 2011-08-02 NOTE — ED Provider Notes (Signed)
Medical screening examination/treatment/procedure(s) were performed by non-physician practitioner and as supervising physician I was immediately available for consultation/collaboration.   Loren Racer, MD 08/02/11 2306

## 2011-08-02 NOTE — ED Provider Notes (Signed)
History     CSN: [id number]  Arrival date & time 08/02/11  1933   First MD Initiated Contact with Patient 08/02/11 2025      Chief Complaint  Patient presents with  . Ear Injury    (Consider location/radiation/quality/duration/timing/severity/associated sxs/prior treatment) HPI Comments: Patient here after being stuck in the forehead with the frame from a bed on this past Sunday - she states that initially she had a large lump on her forehead and some soreness - she reports no LOC but states has been dizzy and feels like her left ear is full since then - states she has an artificial bone in her left ear.  She reports dizziness especially with standing up from leaning over - she states that she has not taken her blood pressure medication for the past 4 years - she states that she had lost some weight and was feeling better and did not thinks that she would need it any more.  Patient is a 51 y.o. female presenting with plugged ear sensation. The history is provided by the patient. No language interpreter was used.  Ear Fullness This is a new problem. The current episode started in the past 7 days. The problem occurs constantly. The problem has been unchanged. Associated symptoms include fatigue and vertigo. Pertinent negatives include no abdominal pain, anorexia, arthralgias, change in bowel habit, chest pain, chills, congestion, coughing, diaphoresis, fever, headaches, joint swelling, myalgias, nausea, neck pain, numbness, rash, sore throat, swollen glands, urinary symptoms, visual change, vomiting or weakness. The symptoms are aggravated by bending. She has tried nothing for the symptoms. The treatment provided no relief.    Past Medical History  Diagnosis Date  . Hypertension   . MI (myocardial infarction)     Past Surgical History  Procedure Date  . Back surgery   . Appendectomy     History reviewed. No pertinent family history.  History  Substance Use Topics  . Smoking  status: Current Everyday Smoker -- 1.0 packs/day  . Smokeless tobacco: Not on file  . Alcohol Use: No     quit drinking year ago    OB History    Grav Para Term Preterm Abortions TAB SAB Ect Mult Living                  Review of Systems  Constitutional: Positive for fatigue. Negative for fever, chills and diaphoresis.  HENT: Negative for hearing loss, ear pain, congestion, sore throat, facial swelling and neck pain.   Respiratory: Negative for cough.   Cardiovascular: Negative for chest pain.  Gastrointestinal: Negative for nausea, vomiting, abdominal pain, anorexia and change in bowel habit.  Musculoskeletal: Negative for myalgias, joint swelling and arthralgias.  Skin: Negative for rash.  Neurological: Positive for dizziness and vertigo. Negative for syncope, weakness, numbness and headaches.  All other systems reviewed and are negative.    Allergies  Review of patient's allergies indicates no known allergies.  Home Medications  No current outpatient prescriptions on file.  BP 184/103  Pulse 100  Temp 98.3 F (36.8 C) (Oral)  Resp 18  SpO2 100%  Physical Exam  Nursing note and vitals reviewed. Constitutional: She is oriented to person, place, and time. She appears well-developed and well-nourished. No distress.  HENT:  Head: Normocephalic and atraumatic.  Right Ear: External ear normal.  Nose: Nose normal.  Mouth/Throat: Oropharynx is clear and moist. No oropharyngeal exudate.       No hematoma or ttp - cerumen impaction left ear  Eyes: Conjunctivae are normal. Pupils are equal, round, and reactive to light. No scleral icterus.  Neck: Normal range of motion. Neck supple.  Cardiovascular: Normal rate, regular rhythm and normal heart sounds.  Exam reveals no gallop and no friction rub.   No murmur heard. Pulmonary/Chest: Effort normal and breath sounds normal. No respiratory distress. She has no wheezes. She has no rales. She exhibits no tenderness.  Abdominal:  Soft. Bowel sounds are normal. She exhibits no distension. There is no tenderness.  Musculoskeletal: Normal range of motion. She exhibits no edema and no tenderness.  Lymphadenopathy:    She has no cervical adenopathy.  Neurological: She is alert and oriented to person, place, and time. No cranial nerve deficit. She exhibits normal muscle tone. Coordination normal.  Skin: Skin is warm and dry. No rash noted. No erythema. No pallor.  Psychiatric: She has a normal mood and affect. Her behavior is normal. Judgment and thought content normal.    ED Course  Procedures (including critical care time)  Labs Reviewed - No data to display Ct Head Wo Contrast  08/02/2011  *RADIOLOGY REPORT*  Clinical Data:  Fall, dizziness, struck face  CT HEAD WITHOUT CONTRAST CT MAXILLOFACIAL WITHOUT CONTRAST  Technique:  Multidetector CT imaging of the head and maxillofacial structures were performed using the standard protocol without intravenous contrast. Multiplanar CT image reconstructions of the maxillofacial structures were also generated.  Comparison:   None.  CT HEAD  Findings: No acute intracranial hemorrhage.  No focal mass lesion. No CT evidence of acute infarction.   No midline shift or mass effect.  No hydrocephalus.  Basilar cisterns are patent. There is mild mildly dilated.  There is periventricular hypodensities.  No skull base fracture.  Mastoid air cells are clear.  No fluid the paranasal sinuses.  Small amount of frothy material in the left anterior ethmoid air cells.  Orbits are normal  IMPRESSION: 1.  No intracranial trauma.  2.  Mild microvascular disease 3.  Mild ventriculomegaly.  CT MAXILLOFACIAL  Findings:   No orbital wall fracture.  Intraconal contents are normal.  No maxilla fracture or pterygoid plate fracture.  No fluid in the paranasal sinuses to suggest hemorrhage.  Nasal bones intact.  No evidence of mandibular fracture.  IMPRESSION: No evidence of facial bone fracture.  Original Report  Authenticated By: Genevive Bi, M.D.   Ct Maxillofacial Wo Cm  08/02/2011  *RADIOLOGY REPORT*  Clinical Data:  Fall, dizziness, struck face  CT HEAD WITHOUT CONTRAST CT MAXILLOFACIAL WITHOUT CONTRAST  Technique:  Multidetector CT imaging of the head and maxillofacial structures were performed using the standard protocol without intravenous contrast. Multiplanar CT image reconstructions of the maxillofacial structures were also generated.  Comparison:   None.  CT HEAD  Findings: No acute intracranial hemorrhage.  No focal mass lesion. No CT evidence of acute infarction.   No midline shift or mass effect.  No hydrocephalus.  Basilar cisterns are patent. There is mild mildly dilated.  There is periventricular hypodensities.  No skull base fracture.  Mastoid air cells are clear.  No fluid the paranasal sinuses.  Small amount of frothy material in the left anterior ethmoid air cells.  Orbits are normal  IMPRESSION: 1.  No intracranial trauma.  2.  Mild microvascular disease 3.  Mild ventriculomegaly.  CT MAXILLOFACIAL  Findings:   No orbital wall fracture.  Intraconal contents are normal.  No maxilla fracture or pterygoid plate fracture.  No fluid in the paranasal sinuses to suggest hemorrhage.  Nasal bones intact.  No evidence of mandibular fracture.  IMPRESSION: No evidence of facial bone fracture.  Original Report Authenticated By: Genevive Bi, M.D.     Dizziness Hypertension   MDM  Patient here with acute onset of dizziness after striking her head - this appears to be more related to her untreated HTN than anything - I will place her back on blood pressure medication and she will follow up with PCP        Scarlette Calico C. Ernest, [place] 08/02/11 2230

## 2015-04-01 ENCOUNTER — Emergency Department (HOSPITAL_COMMUNITY): Payer: Self-pay

## 2015-04-01 ENCOUNTER — Inpatient Hospital Stay (HOSPITAL_COMMUNITY)
Admission: EM | Admit: 2015-04-01 | Discharge: 2015-04-04 | DRG: 871 | Disposition: A | Discharge status: Home / Self Care | Payer: Self-pay | Attending: Internal Medicine | Admitting: Internal Medicine

## 2015-04-01 ENCOUNTER — Encounter (HOSPITAL_COMMUNITY): Payer: Self-pay | Admitting: Emergency Medicine

## 2015-04-01 DIAGNOSIS — E860 Dehydration: Secondary | ICD-10-CM | POA: Diagnosis present

## 2015-04-01 DIAGNOSIS — R0902 Hypoxemia: Secondary | ICD-10-CM

## 2015-04-01 DIAGNOSIS — I252 Old myocardial infarction: Secondary | ICD-10-CM

## 2015-04-01 DIAGNOSIS — R000 Tachycardia, unspecified: Secondary | ICD-10-CM

## 2015-04-01 DIAGNOSIS — N179 Acute kidney failure, unspecified: Secondary | ICD-10-CM | POA: Diagnosis present

## 2015-04-01 DIAGNOSIS — R112 Nausea with vomiting, unspecified: Secondary | ICD-10-CM

## 2015-04-01 DIAGNOSIS — D72829 Elevated white blood cell count, unspecified: Secondary | ICD-10-CM

## 2015-04-01 DIAGNOSIS — I110 Hypertensive heart disease with heart failure: Secondary | ICD-10-CM | POA: Diagnosis present

## 2015-04-01 DIAGNOSIS — J189 Pneumonia, unspecified organism: Secondary | ICD-10-CM

## 2015-04-01 DIAGNOSIS — J441 Chronic obstructive pulmonary disease with (acute) exacerbation: Secondary | ICD-10-CM

## 2015-04-01 DIAGNOSIS — I4581 Long QT syndrome: Secondary | ICD-10-CM | POA: Diagnosis present

## 2015-04-01 DIAGNOSIS — J449 Chronic obstructive pulmonary disease, unspecified: Secondary | ICD-10-CM | POA: Diagnosis present

## 2015-04-01 DIAGNOSIS — R197 Diarrhea, unspecified: Secondary | ICD-10-CM

## 2015-04-01 DIAGNOSIS — I5022 Chronic systolic (congestive) heart failure: Secondary | ICD-10-CM | POA: Diagnosis present

## 2015-04-01 DIAGNOSIS — R0602 Shortness of breath: Secondary | ICD-10-CM

## 2015-04-01 DIAGNOSIS — J9601 Acute respiratory failure with hypoxia: Secondary | ICD-10-CM

## 2015-04-01 DIAGNOSIS — R509 Fever, unspecified: Secondary | ICD-10-CM

## 2015-04-01 DIAGNOSIS — R059 Cough, unspecified: Secondary | ICD-10-CM

## 2015-04-01 DIAGNOSIS — F17200 Nicotine dependence, unspecified, uncomplicated: Secondary | ICD-10-CM | POA: Diagnosis present

## 2015-04-01 DIAGNOSIS — J440 Chronic obstructive pulmonary disease with acute lower respiratory infection: Secondary | ICD-10-CM | POA: Diagnosis present

## 2015-04-01 DIAGNOSIS — J3489 Other specified disorders of nose and nasal sinuses: Secondary | ICD-10-CM

## 2015-04-01 DIAGNOSIS — R05 Cough: Secondary | ICD-10-CM

## 2015-04-01 DIAGNOSIS — I429 Cardiomyopathy, unspecified: Secondary | ICD-10-CM | POA: Diagnosis present

## 2015-04-01 DIAGNOSIS — A419 Sepsis, unspecified organism: Principal | ICD-10-CM

## 2015-04-01 DIAGNOSIS — Z720 Tobacco use: Secondary | ICD-10-CM

## 2015-04-01 LAB — CBC
HCT: 41.7 % (ref 36.0–46.0)
Hemoglobin: 13.7 g/dL (ref 12.0–15.0)
MCH: 28.5 pg (ref 26.0–34.0)
MCHC: 32.9 g/dL (ref 30.0–36.0)
MCV: 86.9 fL (ref 78.0–100.0)
PLATELETS: 356 10*3/uL (ref 150–400)
RBC: 4.8 MIL/uL (ref 3.87–5.11)
RDW: 14 % (ref 11.5–15.5)
WBC: 27.7 10*3/uL — ABNORMAL HIGH (ref 4.0–10.5)

## 2015-04-01 LAB — DIFFERENTIAL
Band Neutrophils: 0 %
Basophils Relative: 0 %
Blasts: 0 %
EOS PCT: 0 %
LYMPHS PCT: 10 %
Lymphs Abs: 2 10*3/uL (ref 0.7–4.0)
METAMYELOCYTES PCT: 0 %
MONO ABS: 0.8 10*3/uL (ref 0.1–1.0)
MYELOCYTES: 0 %
Monocytes Relative: 4 %
NEUTROS ABS: 17.6 10*3/uL — AB (ref 1.7–7.7)
NEUTROS PCT: 86 %
Other: 0 %
PROMYELOCYTES ABS: 0 %
nRBC: 0 /100 WBC

## 2015-04-01 LAB — INFLUENZA PANEL BY PCR (TYPE A & B)
H1N1FLUPCR: NOT DETECTED
INFLBPCR: NEGATIVE
Influenza A By PCR: NEGATIVE

## 2015-04-01 LAB — COMPREHENSIVE METABOLIC PANEL
ALBUMIN: 3.4 g/dL — AB (ref 3.5–5.0)
ALT: 12 U/L — ABNORMAL LOW (ref 14–54)
ANION GAP: 15 (ref 5–15)
AST: 18 U/L (ref 15–41)
Alkaline Phosphatase: 82 U/L (ref 38–126)
BUN: 41 mg/dL — ABNORMAL HIGH (ref 6–20)
CALCIUM: 9.7 mg/dL (ref 8.9–10.3)
CHLORIDE: 106 mmol/L (ref 101–111)
CO2: 17 mmol/L — AB (ref 22–32)
Creatinine, Ser: 1.63 mg/dL — ABNORMAL HIGH (ref 0.44–1.00)
GFR calc non Af Amer: 34 mL/min — ABNORMAL LOW (ref >60)
GFR, EST AFRICAN AMERICAN: 40 mL/min — AB (ref >60)
GLUCOSE: 149 mg/dL — AB (ref 65–99)
POTASSIUM: 3.5 mmol/L (ref 3.5–5.1)
SODIUM: 138 mmol/L (ref 135–145)
Total Bilirubin: 0.5 mg/dL (ref 0.3–1.2)
Total Protein: 9.4 g/dL — ABNORMAL HIGH (ref 6.5–8.1)

## 2015-04-01 LAB — URINALYSIS, ROUTINE W REFLEX MICROSCOPIC
BILIRUBIN URINE: NEGATIVE
GLUCOSE, UA: NEGATIVE mg/dL
Ketones, ur: NEGATIVE mg/dL
NITRITE: POSITIVE — AB
PH: 6 (ref 5.0–8.0)
Protein, ur: 30 mg/dL — AB
SPECIFIC GRAVITY, URINE: 1.015 (ref 1.005–1.030)

## 2015-04-01 LAB — I-STAT ARTERIAL BLOOD GAS, ED
Acid-base deficit: 6 mmol/L — ABNORMAL HIGH (ref 0.0–2.0)
BICARBONATE: 18.2 meq/L — AB (ref 20.0–24.0)
O2 SAT: 90 %
TCO2: 19 mmol/L (ref 0–100)
pCO2 arterial: 31.3 mmHg — ABNORMAL LOW (ref 35.0–45.0)
pH, Arterial: 7.373 (ref 7.350–7.450)
pO2, Arterial: 60 mmHg — ABNORMAL LOW (ref 80.0–100.0)

## 2015-04-01 LAB — URINE MICROSCOPIC-ADD ON

## 2015-04-01 LAB — I-STAT CG4 LACTIC ACID, ED
Lactic Acid, Venous: 2.35 mmol/L (ref 0.5–2.0)
Lactic Acid, Venous: 1.46 mmol/L (ref 0.5–2.0)

## 2015-04-01 LAB — I-STAT TROPONIN, ED: TROPONIN I, POC: 0.03 ng/mL (ref 0.00–0.08)

## 2015-04-01 LAB — BRAIN NATRIURETIC PEPTIDE: B Natriuretic Peptide: 79.2 pg/mL (ref 0.0–100.0)

## 2015-04-01 MED ORDER — DEXTROSE 5 % IV SOLN
500.0000 mg | INTRAVENOUS | Status: DC
  Administered 2015-04-02: 500 mg via INTRAVENOUS
  Filled 2015-04-01 (×2): qty 500

## 2015-04-01 MED ORDER — SODIUM CHLORIDE 0.9 % IV SOLN
1000.0000 mL | INTRAVENOUS | Status: DC
  Administered 2015-04-01 – 2015-04-03 (×3): 1000 mL via INTRAVENOUS

## 2015-04-01 MED ORDER — ALBUTEROL SULFATE (2.5 MG/3ML) 0.083% IN NEBU
5.0000 mg | INHALATION_SOLUTION | Freq: Once | RESPIRATORY_TRACT | Status: AC
  Administered 2015-04-01: 5 mg via RESPIRATORY_TRACT
  Filled 2015-04-01: qty 6

## 2015-04-01 MED ORDER — ACETAMINOPHEN 325 MG PO TABS
650.0000 mg | ORAL_TABLET | Freq: Once | ORAL | Status: AC
  Administered 2015-04-01: 650 mg via ORAL
  Filled 2015-04-01: qty 2

## 2015-04-01 MED ORDER — PROMETHAZINE HCL 25 MG/ML IJ SOLN
25.0000 mg | Freq: Once | INTRAMUSCULAR | Status: AC
  Administered 2015-04-01: 25 mg via INTRAVENOUS
  Filled 2015-04-01: qty 1

## 2015-04-01 MED ORDER — IPRATROPIUM BROMIDE 0.02 % IN SOLN
0.5000 mg | Freq: Once | RESPIRATORY_TRACT | Status: AC
  Administered 2015-04-01: 0.5 mg via RESPIRATORY_TRACT
  Filled 2015-04-01: qty 2.5

## 2015-04-01 MED ORDER — DEXTROSE 5 % IV SOLN
1.0000 g | INTRAVENOUS | Status: DC
  Administered 2015-04-02 – 2015-04-03 (×2): 1 g via INTRAVENOUS
  Filled 2015-04-01 (×3): qty 10

## 2015-04-01 MED ORDER — DEXTROSE 5 % IV SOLN
500.0000 mg | Freq: Once | INTRAVENOUS | Status: AC
  Administered 2015-04-01: 500 mg via INTRAVENOUS
  Filled 2015-04-01: qty 500

## 2015-04-01 MED ORDER — SODIUM CHLORIDE 0.9 % IV BOLUS (SEPSIS)
1000.0000 mL | INTRAVENOUS | Status: AC
  Administered 2015-04-01 (×3): 1000 mL via INTRAVENOUS

## 2015-04-01 MED ORDER — DEXTROSE 5 % IV SOLN
1.0000 g | Freq: Once | INTRAVENOUS | Status: AC
  Administered 2015-04-01: 1 g via INTRAVENOUS
  Filled 2015-04-01: qty 10

## 2015-04-01 NOTE — Progress Notes (Signed)
Pharmacy Antibiotic Note Eliany Hempfling is a 55 y.o. female admitted on 04/01/2015 with pneumonia/CAP. Pharmacy has been consulted for ceftriaxone and azithromycin dosing.  Plan: 1. Ceftriaxone 1 gram IV q 24 hours 2. Azithromycin 500 mg IV q 24 hours  Weight: 216 lb 6.4 oz (98.158 kg)  Temp (24hrs), Avg:100.8 F (38.2 C), Min:99.1 F (37.3 C), Max:102.4 F (39.1 C)   Recent Labs Lab 04/01/15 1348 04/01/15 1400  WBC 27.7*  --   CREATININE 1.63*  --   LATICACIDVEN  --  2.35*    CrCl cannot be calculated (Unknown ideal weight.).    No Known Allergies  Antimicrobials this admission: 2/24 Ceftriaxone >>  2/24 Azithromycin >>   Dose adjustments this admission: n/a  Microbiology results: Px  Thank you for allowing pharmacy to be a part of this patient's care.  Sheron Nightingale 04/01/2015 2:44 PM

## 2015-04-01 NOTE — ED Notes (Signed)
Ordered pt's dinner 

## 2015-04-01 NOTE — ED Provider Notes (Signed)
CSN: [id number]     Arrival date & time 04/01/15  1323 History   First MD Initiated Contact with Patient 04/01/15 1409     Chief Complaint  Patient presents with  . Shortness of Breath     (Consider location/radiation/quality/duration/timing/severity/associated sxs/prior Treatment) HPI Comments: Joanna [name] is a 55 y.o. female with a PMHx of CHF, HTN, and ?remote MI, who presents to the ED with complaints of 2 weeks of gradually worsening shortness of breath and cough. Level V caveat due to patient condition history is somewhat limited. Patient states that she developed a cough approximately 2 weeks ago, with green sputum production, denies any frank hemoptysis. States that she gradually became more short of breath, worsening to the point that today she felt like she needed to come to the ER. She is not on home oxygen normally, has no formal diagnosis of COPD but is a smoker. Upon arrival she was hypoxic at 89% on room air, improved with 2 L via nasal cannula up to 92%. She reports she's been having subjective fevers/chills, rhinorrhea, and wheezing. She also reports that she has been diaphoretic and been "getting sweaty" recently. Additionally she reports nausea and vomiting this morning, she cannot recall how many times, as well as a approximately 3-4 episodes of diarrhea this morning. She cannot further describe her emesis or diarrhea. She has not tried anything for her symptoms prior to arrival. Positive sick contacts at home.  She denies any chest pain, leg swelling, recent travel/surgery/immobilization, family or personal history of DVT/PE, history of cancer, estrogen use, abdominal pain, constipation, melena, hematochezia, hematemesis, dysuria, hematuria, numbness, tingling, weakness, or lightheadedness. She denies any other symptoms at this time.  She also mentions that she has not followed up with cardiology (Dr. Gala Romney) in many years, chart review reveals she saw him in the hospital in  2007 when she was here for acute CHF exacerbation. She denies having a PCP, states "I'm not usually sick so I don't go to doctors."  Patient is a 55 y.o. female presenting with shortness of breath. The history is provided by the patient and medical records. The history is limited by the condition of the patient. No language interpreter was used.  Shortness of Breath Severity:  Severe Onset quality:  Gradual Duration:  2 weeks Timing:  Constant Progression:  Worsening Chronicity:  New Context: URI   Relieved by:  None tried Worsened by:  Nothing tried Ineffective treatments:  None tried Associated symptoms: cough, diaphoresis, fever (subjective), sputum production, vomiting and wheezing   Associated symptoms: no abdominal pain, no chest pain and no hemoptysis   Risk factors: tobacco use   Risk factors: no family hx of DVT, no hx of cancer, no hx of PE/DVT, no oral contraceptive use, no prolonged immobilization and no recent surgery     Past Medical History  Diagnosis Date  . Hypertension   . MI (myocardial infarction) [facility])    Past Surgical History  Procedure Laterality Date  . Back surgery    . Appendectomy     No family history on file. Social History  Substance Use Topics  . Smoking status: Current Every Day Smoker -- 1.00 packs/day  . Smokeless tobacco: None  . Alcohol Use: No     Comment: quit drinking year ago   OB History    No data available      Review of Systems  Unable to perform ROS: Acuity of condition  Constitutional: Positive for fever (subjective), chills and diaphoresis.  HENT: Positive for rhinorrhea.   Respiratory: Positive for cough, sputum production, shortness of breath and wheezing. Negative for hemoptysis.   Cardiovascular: Negative for chest pain and leg swelling.  Gastrointestinal: Positive for nausea, vomiting and diarrhea. Negative for abdominal pain, constipation and blood in stool.  Genitourinary: Negative for dysuria and hematuria.   Musculoskeletal: Negative for myalgias and arthralgias.  Skin: Negative for color change.  Allergic/Immunologic: Negative for immunocompromised state.  Neurological: Negative for weakness, light-headedness and numbness.  Psychiatric/Behavioral: Negative for confusion.   10 Systems reviewed and are negative for acute change except as noted in the HPI.    Allergies  Review of patient's allergies indicates no known allergies.  Home Medications   Prior to Admission medications   Medication Sig Start Date End Date Taking? Authorizing Provider  lisinopril (PRINIVIL,ZESTRIL) 20 MG tablet Take 1 tablet (20 mg total) by mouth daily. 08/02/11 08/01/12  Cherrie Distance, PA-C   Initial VS: BP 116/60 mmHg  Pulse 122  Temp(Src) 99.1 F (37.3 C) (Oral)  Resp 25  SpO2 92% Re-check VS: BP 116/60 mmHg  Pulse 122  Temp(Src) 102.4 F (39.1 C) (Rectal)  Resp 25  Wt 98.158 kg  SpO2 92%  Physical Exam  Constitutional: She is oriented to person, place, and time. She appears well-developed and well-nourished. She appears distressed (increased work of breathing).  Low-grade temp 99.1 initially which was revealed to be 102.4 on rectal temp, distressed appearing due to being diaphoretic/sweaty and working hard to breath  HENT:  Head: Normocephalic and atraumatic.  Nose: Rhinorrhea present.  Mouth/Throat: Oropharynx is clear and moist. Mucous membranes are dry.  Mild rhinorrhea. Exam limited due to pt condition. Very dry mucous membranes  Eyes: Conjunctivae and EOM are normal. Right eye exhibits no discharge. Left eye exhibits no discharge.  Neck: Normal range of motion. Neck supple.  Cardiovascular: Regular rhythm, normal heart sounds and intact distal pulses.  Tachycardia present.  Exam reveals no gallop and no friction rub.   No murmur heard. Tachycardic in the 120s, reg rhythm, nl s1/s2, no m/r/g audible although exam limited due to respiratory status, no pedal edema, distal pulses intact   Pulmonary/Chest: She is in respiratory distress (increased WOB). She has rhonchi. She has no rales.  Increased WOB, speaking in fragmented sentences, slightly tachypneic, with rhonchi in lower lung fields without audible wheezing but rhonchi obscures exam slightly. No rales heard. Hypoxic to 89% on RA, which improves to 92-94% on 2L via [place]  Abdominal: Soft. Normal appearance and bowel sounds are normal. She exhibits no distension. There is no tenderness. There is no rigidity, no rebound, no guarding, no CVA tenderness, no tenderness at McBurney's point and negative Murphy's sign.  Musculoskeletal: Normal range of motion.  MAE x4 Strength and sensation grossly intact Distal pulses intact No pedal edema, neg homan's bilaterally   Neurological: She is alert and oriented to person, place, and time. She has normal strength. No sensory deficit.  Skin: Skin is warm and intact. No rash noted. She is diaphoretic.  Diaphoretic/sweaty, skin feels hot  Psychiatric: She has a normal mood and affect.  Nursing note and vitals reviewed.   ED Course  Procedures (including critical care time)  CRITICAL CARE-sepsis with abnormal vitals, requiring fluids/etc Performed by: Ramond Marrow   Total critical care time: 60 minutes  Critical care time was exclusive of separately billable procedures and treating other patients.  Critical care was necessary to treat or prevent imminent or life-threatening deterioration.  Critical care was  time spent personally by me on the following activities: development of treatment plan with patient and/or surrogate as well as nursing, discussions with consultants, evaluation of patient's response to treatment, examination of patient, obtaining history from patient or surrogate, ordering and performing treatments and interventions, ordering and review of laboratory studies, ordering and review of radiographic studies, pulse oximetry and re-evaluation of  patient's condition.   Labs Review Labs Reviewed  CBC - Abnormal; Notable for the following:    WBC 27.7 (*)    All other components within normal limits  COMPREHENSIVE METABOLIC PANEL - Abnormal; Notable for the following:    CO2 17 (*)    Glucose, Bld 149 (*)    BUN 41 (*)    Creatinine, Ser 1.63 (*)    Total Protein 9.4 (*)    Albumin 3.4 (*)    ALT 12 (*)    GFR calc non Af Amer 34 (*)    GFR calc Af Amer 40 (*)    All other components within normal limits  I-STAT CG4 LACTIC ACID, ED - Abnormal; Notable for the following:    Lactic Acid, Venous 2.35 (*)    All other components within normal limits  I-STAT ARTERIAL BLOOD GAS, ED - Abnormal; Notable for the following:    pCO2 arterial 31.3 (*)    pO2, Arterial 60.0 (*)    Bicarbonate 18.2 (*)    Acid-base deficit 6.0 (*)    All other components within normal limits  CULTURE, BLOOD (ROUTINE X 2)  CULTURE, BLOOD (ROUTINE X 2)  URINE CULTURE  DIFFERENTIAL  URINALYSIS, ROUTINE W REFLEX MICROSCOPIC (NOT AT [facility])  BRAIN NATRIURETIC PEPTIDE  INFLUENZA PANEL BY PCR (TYPE A & B, H1N1)  I-STAT TROPOININ, ED  I-STAT CG4 LACTIC ACID, ED    Imaging Review Dg Chest 2 View  04/01/2015  CLINICAL DATA:  Diaphoresis, shortness of breath, cough, mid sternal chest tightness, vomiting and weakness for 2 weeks. EXAM: CHEST  2 VIEW COMPARISON:  09/01/2005 FINDINGS: Heart is mildly enlarged. There are bibasilar airspace opacities, most confluent at the right lung base. No effusions. No acute bony abnormality. Mild peribronchial thickening and interstitial prominence. IMPRESSION: Mild bronchitic changes and cardiomegaly. Bibasilar airspace opacities, right greater than left, atelectasis versus pneumonia. Electronically Signed   By: Charlett Nose M.D.   On: 04/01/2015 15:23     Echo 09/2007: SUMMARY - There is some hypokinesis of the posterior and lateral walls. The    left ventricle was mildly dilated. Left ventricular ejection     fraction was estimated to be 45 %. - There was trivial aortic valvular regurgitation. - There was mild to moderate mitral valvular regurgitation. The    effective orifice of mitral regurgitation by proximal    isovelocity surface area was 0.08 cm^2. The volume of mitral    regurgitation by proximal isovelocity surface area was 19 cc. I have personally reviewed and evaluated these images and lab results as part of my medical decision-making.   EKG Interpretation   Date/Time:  Friday April 01 2015 13:36:57 EST Ventricular Rate:  124 PR Interval:    QRS Duration: 124 QT Interval:  354 QTC Calculation: 508 R Axis:   56 Text Interpretation:  Sinus tachycardia Anterior infarct , age  undetermined Marked ST abnormality, possible inferolateral subendocardial  injury Abnormal ECG Interpretation limited secondary to artifact Confirmed  by GOLDSTON  MD, SCOTT ([id number]) on 04/01/2015 2:10:41 PM      MDM   Final diagnoses:  SOB (shortness  of breath)  Other specified fever  Sepsis, due to unspecified organism (HCC)  Cough  Tachycardia  Leukocytosis  Hypoxia  AKI (acute kidney injury) (HCC)  Community acquired pneumonia  Nausea vomiting and diarrhea  Rhinorrhea    55 y.o. female here with 2wks of cough and SOB, subjective fevers. Here she is requiring oxygen, SpO2 89% on RA, speaking in fragmented sentences. Very rhonchorous sounds in lower lung fields, no wheezing audible but rhonchi obscures ability to hear wheezing. Temp 99.1, will obtain rectal temp. CBC with leukocytosis of 27.7, lactic 2.35, tachycardic in the 120s, therefore meeting Sepsis criteria. Code sepsis activated, fluids ordered per weight-based calculations. Trop neg. EKG with sinus tachy, some ST changes but very poor quality EKG therefore difficult to assess. Pt denies CP. CMP pending. Will add on differential, U/A with culture, BCx, ABG, BNP, and give neb tx and empiric abx. Doubt PE as an etiology,  although with hypoxia and tachycardia this is a possible concern, but with the rhonchorous breath sounds and current presenting hx of illness, it seems more likely that this is sepsis due to PNA or pulmonary infect. CXR not yet obtained, will get this done ASAP. She has hx of CHF but hasn't followed up with Dr. Gala Romney in many years (saw him while in the hospital in 2007). +Smoker. Will monitor closely and admit once more labs/imaging are back.   2:39 PM Rectal temp 102.4. CMP with Cr 1.63, BUN 41, CO2 17 with normal anion gap which is a change from baseline BUN/Cr (at least based on labs from 71yrs ago which is the last lab listed in chart). Will give tylenol and phenergan (for nausea) and await CXR and ABG, then proceed with admission.   2:55 PM ABG with pH 7.37 with low CO2 and O2 and bicarb, which indicates mixed/compensated result. Having nursing staff call radiology to get pt's CXR done ASAP, so we can proceed with admission. Will also add on flu swab, since pt will be admitted and this will be helpful additional information. Of note, I discussed this pt with my attending Dr. Criss Alvine who agrees with current plan.   3:26 PM CXR with bibasilar opacities R>L which could represent PNA vs atelectasis. Given clinical picture, likely PNA. Will proceed with admission. Differential still not processed, BNP and U/A and UCx not yet obtained but nurse is getting these done now. Flu swab not yet done either, nurse currently getting this performed. Her tachycardia is improving with fluids, now down to 93-100.   3:57 PM Still haven't heard back from admission team, will have them repaged. Will await call back.  4:17 PM Called flow manager who states the page went out approx 20 minutes ago, should be to Dr. Waymon Amato. I paged him myself, and he called me back shortly after, states he will review chart then call me back.   4:24 PM Dr. Waymon Amato returning page, will admit to step down. Will come see pt  shortly. Holding orders placed. Please see his notes for further documentation of care. Pt stable at this time, tachycardia improving.  BP 110/86 mmHg  Pulse 102  Temp(Src) 102.4 F (39.1 C) (Rectal)  Resp 21  Wt 98.158 kg  SpO2 96%  Meds ordered this encounter  Medications  . 0.9 %  sodium chloride infusion    Sig:   . cefTRIAXone (ROCEPHIN) 1 g in dextrose 5 % 50 mL IVPB    Sig:     Order Specific Question:  Antibiotic Indication:  Answer:  CAP  . azithromycin (ZITHROMAX) 500 mg in dextrose 5 % 250 mL IVPB    Sig:     Order Specific Question:  Antibiotic Indication:    Answer:  CAP  . albuterol (PROVENTIL) (2.5 MG/3ML) 0.083% nebulizer solution 5 mg    Sig:   . ipratropium (ATROVENT) nebulizer solution 0.5 mg    Sig:   . sodium chloride 0.9 % bolus 1,000 mL    Sig:     Order Specific Question:  Weight basis for 30 mL/kg NS bolus    Answer:  98.158 kg  . acetaminophen (TYLENOL) tablet 650 mg    Sig:   . cefTRIAXone (ROCEPHIN) 1 g in dextrose 5 % 50 mL IVPB    Sig:     Order Specific Question:  Antibiotic Indication:    Answer:  CAP  . azithromycin (ZITHROMAX) 500 mg in dextrose 5 % 250 mL IVPB    Sig:     Order Specific Question:  Antibiotic Indication:    Answer:  CAP  . promethazine (PHENERGAN) injection 25 mg    Sig:      Ezme Duch Camprubi-Soms, PA-C 04/01/15 1626  Pricilla Loveless, MD 04/02/15 [phone]

## 2015-04-01 NOTE — ED Notes (Signed)
While pt was maintaining stable O2 levels, it appeared she was working hard to breath.  Placed pt on 2L of O2 [place].  Pt expressed relief.

## 2015-04-01 NOTE — ED Notes (Signed)
Pt placed on 2L [place]

## 2015-04-01 NOTE — ED Notes (Signed)
Will collect lactic when fluids finish.

## 2015-04-01 NOTE — ED Notes (Signed)
Pt states shes felt sweaty and SOB for the last week with a cough. Pt states shes been coughing a lot, as well as vomiting. Pt also feels weak. Pt saturations 89% on Room air, lung sounds rhochi in lower lobes.

## 2015-04-01 NOTE — ED Notes (Signed)
EDP at bedside  

## 2015-04-01 NOTE — ED Notes (Signed)
Pt. Put on 3L oxygen via [place] at this time to get her oxygen saturation up to 91%.

## 2015-04-01 NOTE — ED Notes (Signed)
Antibiotics started at this time due to time and lack of second IV access/blood draw at this time.

## 2015-04-01 NOTE — H&P (Signed)
History and Physical  Joanna [name] HAL:[id number] DOB: 1960-11-04 DOA: 04/01/2015  Referring physician: Coralee Rud, ED PA-C PCP: Pcp Not In System  Outpatient Specialists:  1. None  Chief Complaint: generalized weakness, productive cough, dyspnea, wheezing, nausea, vomiting, diarrhea and fevers.  HPI: Joanna [name] is a 55 y.o. female , single, lives with her brother, independent of activities of daily living, denies knowledge of any past medical history and does not see a physician on a regular basis, uses when necessary BC powder for aches and pains but not on any prescription medications, presented to [facility] ED on 04/01/15 with complaints of generalized weakness, productive cough, dyspnea, wheezing, nausea, vomiting, diarrhea and fevers. Upon chart review, cardiology discharge summary 7/35/3299: Acute systolic Congestive heart failure, hypertensive nonischemic cardiomyopathy, LVEF 30-35 percent with diffuse hypokinesis and moderate mitral regurgitation, HTN, tobacco abuse, obesity, hyperglycemia without history of diabetes. LHC 08/31/2005: Normal coronaries with EF 25-30 percent. As stated above, patient has not followed with any physicians. History obtained from patient and her family (brother and sister at bedside). She apparently was in her usual state of health until 2 weeks ago. 2 weeks ago, she brought a friend who was sick with complaints of cough and vomiting to the ED and states that since then she caught the illness. She gives 2 weeks history of generalized weakness, cough productive of intermittent yellow sputum, dyspnea, wheezing, fever of unknown severity, nausea, nonbloody emesis and diarrhea, decreased oral intake. She denies headache, earache, sore throat, chest pain, dysuria, urinary frequency or open skin wounds. She had been progressively worsening and despite insistence by her brother, refused to come to the hospital for evaluation and to today.  In the ED, patient  was hypoxic at 89% on room air, met sepsis criteria, chest x-ray suggestive of pneumonia. Lactate elevated. Acute kidney injury. Sepsis protocol was initiated and patient was aggressively hydrated with IV fluids and started on IV Rocephin and azithromycin. Patient starting to feel better. Reports no dyspnea at this time. Asking for ice cream to eat .Hospitalist admission was requested.   Review of Systems: All systems reviewed and apart from history of presenting illness, are negative.  Past Medical History  Diagnosis Date  . Hypertension   . MI (myocardial infarction) [facility])    Past Surgical History  Procedure Laterality Date  . Back surgery    . Appendectomy     Social History:  reports that she has been smoking.  She does not have any smokeless tobacco history on file. She reports that she does not drink alcohol or use illicit drugs. Rest as per history of presenting illness.  No Known Allergies  History reviewed. No pertinent family history. asked patient and unable to provide any significant family history.  Prior to Admission medications   Medication Sig Start Date End Date Taking? Authorizing Provider  lisinopril (PRINIVIL,ZESTRIL) 20 MG tablet Take 1 tablet (20 mg total) by mouth daily. 08/02/11 08/01/12  Ignacia Felling, PA-C   Physical Exam: Filed Vitals:   04/01/15 1425 04/01/15 1436 04/01/15 1500 04/01/15 1645  BP:   110/86 119/69  Pulse:   102 93  Temp:  102.4 F (39.1 C)    TempSrc:  Rectal    Resp:   21 21  Weight: 98.158 kg (216 lb 6.4 oz)     SpO2:   96% 94%     General exam: Moderately built and poorly nourished pleasant middle-aged female patient, lying comfortably supine on the gurney in no obvious distress. Appears  ill looking.  Head, eyes and ENT: Nontraumatic and normocephalic. Pupils equally reacting to light and accommodation. Oral mucosa dry. Lips coated white.  Neck: Supple. No JVD, carotid bruit or thyromegaly.  Lymphatics: No  lymphadenopathy.  Respiratory system: Reduced breath sounds bilaterally, especially at the bases with scattered few medium pitched expiratory rhonchi. No increased work of breathing.  Cardiovascular system: S1 and S2 heard, RRR. No JVD, murmurs, gallops, clicks or pedal edema. Telemetry: Sinus rhythm.  Gastrointestinal system: Abdomen is nondistended, soft and nontender. Normal bowel sounds heard. No organomegaly or masses appreciated.  Central nervous system: Alert and oriented. No focal neurological deficits.  Extremities: Symmetric 5 x 5 power. Peripheral pulses symmetrically felt.   Skin: No rashes or acute findings. Patient has multiple scars all over her limbs and states that these are from soccer injuries as a child.  Musculoskeletal system: Negative exam.  Psychiatry: Pleasant and cooperative.   Labs on Admission:  Basic Metabolic Panel:  Recent Labs Lab 04/01/15 1348  NA 138  K 3.5  CL 106  CO2 17*  GLUCOSE 149*  BUN 41*  CREATININE 1.63*  CALCIUM 9.7   Liver Function Tests:  Recent Labs Lab 04/01/15 1348  AST 18  ALT 12*  ALKPHOS 82  BILITOT 0.5  PROT 9.4*  ALBUMIN 3.4*   No results for input(s): LIPASE, AMYLASE in the last 168 hours. No results for input(s): AMMONIA in the last 168 hours. CBC:  Recent Labs Lab 04/01/15 1348 04/01/15 1546  WBC 27.7*  --   NEUTROABS  --  17.6*  HGB 13.7  --   HCT 41.7  --   MCV 86.9  --   PLT 356  --    Cardiac Enzymes: No results for input(s): CKTOTAL, CKMB, CKMBINDEX, TROPONINI in the last 168 hours.  BNP (last 3 results) No results for input(s): PROBNP in the last 8760 hours. CBG: No results for input(s): GLUCAP in the last 168 hours.  Radiological Exams on Admission: Dg Chest 2 View  04/01/2015  CLINICAL DATA:  Diaphoresis, shortness of breath, cough, mid sternal chest tightness, vomiting and weakness for 2 weeks. EXAM: CHEST  2 VIEW COMPARISON:  09/01/2005 FINDINGS: Heart is mildly enlarged. There  are bibasilar airspace opacities, most confluent at the right lung base. No effusions. No acute bony abnormality. Mild peribronchial thickening and interstitial prominence. IMPRESSION: Mild bronchitic changes and cardiomegaly. Bibasilar airspace opacities, right greater than left, atelectasis versus pneumonia. Electronically Signed   By: Rolm Baptise M.D.   On: 04/01/2015 15:23    EKG: Independently reviewed. Poor quality EKG. Sinus tachycardia at 124 bpm, no obvious acute changes and QTC 508 ms.  Assessment/Plan Principal Problem:   CAP (community acquired pneumonia) - Empirically started on IV Rocephin and azithromycin, continue - Follow flu panel PCR and continue droplet isolation for now. - Will need follow-up chest x-ray in 3-4 weeks to ensure resolution of pneumonia findings.  Active Problems:   Sepsis ([place]) -  Secondary to community-acquired pneumonia. Other differential diagnosis: Influenza, gastroenteritis. - Treated with aggressive IV fluids, IV Rocephin and azithromycin. - Trend lactate. Continue fluids and antibiotics.    Dehydration - Secondary to GI losses, poor oral intake and sepsis picture. IV fluids.    AKI (acute kidney injury) ([place]) - Secondary to dehydration +/- BC powder use.  - IV fluids and follow BMP     Acute respiratory failure with hypoxia (HCC) - Secondary to COPD exacerbation and a minute acquired pneumonia. - Treat underlying cause. Oxygen  supplementation and bronchodilators.    Tobacco abuse - Cessation counseled. Continue nicotine patch.     Nausea vomiting and diarrhea - DD: Constitutional symptoms from flulike illness, acute viral GE, rule out C. difficile.  - Check C. difficile panel PCR and GI pathogen panel PCR.  - Treat supportively. IV PPI for now.     COPD exacerbation ([place]) - Secondary to pneumonia and ongoing tobacco abuse. Continue oxygen, bronchodilators and antibiotics. No significant bronchospasm currently and hence will hold off on  steroids.  History of chronic systolic CHF and nonischemic cardiomyopathy - clinically dehydrated. Continue IV fluids while monitoring closely for fluid overload. Check repeat echo (last echo 2009 showed EF 45%)  Essential hypertension - Not on antihypertensives at home. Controlled. Monitor.  Prolonged QTC - Repeat EKG to confirm prior to further workup. Monitor on telemetry.     DVT Prophylaxis: Lovenox Code Status: Full  Family Communication: Discussed extensively with patient's family (brother and sister at bedside). Updated care and answered questions.  Disposition Plan:  DC home when medically stable.   Time spent: 70 minutes.  Vernell Leep, MD, FACP, FHM. [facility] Pager [phone]  If 7PM-7AM, please contact night-coverage [email or web address] Password [email or web address] 04/01/2015, 5:19 PM

## 2015-04-02 ENCOUNTER — Inpatient Hospital Stay (HOSPITAL_COMMUNITY): Payer: Self-pay

## 2015-04-02 ENCOUNTER — Other Ambulatory Visit (HOSPITAL_COMMUNITY): Payer: Self-pay

## 2015-04-02 DIAGNOSIS — I340 Nonrheumatic mitral (valve) insufficiency: Secondary | ICD-10-CM

## 2015-04-02 LAB — BASIC METABOLIC PANEL
ANION GAP: 9 (ref 5–15)
BUN: 28 mg/dL — ABNORMAL HIGH (ref 6–20)
CALCIUM: 9 mg/dL (ref 8.9–10.3)
CO2: 19 mmol/L — ABNORMAL LOW (ref 22–32)
CREATININE: 0.8 mg/dL (ref 0.44–1.00)
Chloride: 112 mmol/L — ABNORMAL HIGH (ref 101–111)
Glucose, Bld: 126 mg/dL — ABNORMAL HIGH (ref 65–99)
Potassium: 3.2 mmol/L — ABNORMAL LOW (ref 3.5–5.1)
SODIUM: 140 mmol/L (ref 135–145)

## 2015-04-02 LAB — CBC
HEMATOCRIT: 36.9 % (ref 36.0–46.0)
Hemoglobin: 11.6 g/dL — ABNORMAL LOW (ref 12.0–15.0)
MCH: 27.4 pg (ref 26.0–34.0)
MCHC: 31.4 g/dL (ref 30.0–36.0)
MCV: 87.2 fL (ref 78.0–100.0)
PLATELETS: 328 10*3/uL (ref 150–400)
RBC: 4.23 MIL/uL (ref 3.87–5.11)
RDW: 14.3 % (ref 11.5–15.5)
WBC: 16.4 10*3/uL — AB (ref 4.0–10.5)

## 2015-04-02 MED ORDER — GUAIFENESIN-DM 100-10 MG/5ML PO SYRP: 5.0000 mL | ORAL_SOLUTION | ORAL | Status: DC | PRN

## 2015-04-02 MED ORDER — ALUM & MAG HYDROXIDE-SIMETH 200-200-20 MG/5ML PO SUSP: 30.0000 mL | Freq: Four times a day (QID) | ORAL | Status: DC | PRN

## 2015-04-02 MED ORDER — ACETAMINOPHEN 325 MG PO TABS
650.0000 mg | ORAL_TABLET | Freq: Four times a day (QID) | ORAL | Status: DC | PRN
  Administered 2015-04-03: 650 mg via ORAL
  Filled 2015-04-02: qty 2

## 2015-04-02 MED ORDER — IPRATROPIUM-ALBUTEROL 0.5-2.5 (3) MG/3ML IN SOLN
3.0000 mL | Freq: Four times a day (QID) | RESPIRATORY_TRACT | Status: DC
  Administered 2015-04-02 – 2015-04-03 (×4): 3 mL via RESPIRATORY_TRACT
  Filled 2015-04-02 (×4): qty 3

## 2015-04-02 MED ORDER — NICOTINE 21 MG/24HR TD PT24
21.0000 mg | MEDICATED_PATCH | Freq: Every day | TRANSDERMAL | Status: DC
  Administered 2015-04-03 – 2015-04-04 (×2): 21 mg via TRANSDERMAL
  Filled 2015-04-02 (×3): qty 1

## 2015-04-02 MED ORDER — ENOXAPARIN SODIUM 40 MG/0.4ML ~~LOC~~ SOLN
40.0000 mg | SUBCUTANEOUS | Status: DC
  Administered 2015-04-02 – 2015-04-04 (×3): 40 mg via SUBCUTANEOUS
  Filled 2015-04-02 (×4): qty 0.4

## 2015-04-02 MED ORDER — PANTOPRAZOLE SODIUM 40 MG IV SOLR
40.0000 mg | INTRAVENOUS | Status: DC
  Administered 2015-04-02 – 2015-04-03 (×2): 40 mg via INTRAVENOUS
  Filled 2015-04-02 (×2): qty 40

## 2015-04-02 MED ORDER — ONDANSETRON HCL 4 MG/2ML IJ SOLN: 4.0000 mg | Freq: Four times a day (QID) | INTRAMUSCULAR | Status: DC | PRN

## 2015-04-02 MED ORDER — ACETAMINOPHEN 650 MG RE SUPP: 650.0000 mg | Freq: Four times a day (QID) | RECTAL | Status: DC | PRN

## 2015-04-02 MED ORDER — ONDANSETRON HCL 4 MG PO TABS: 4.0000 mg | ORAL_TABLET | Freq: Four times a day (QID) | ORAL | Status: DC | PRN

## 2015-04-02 MED ORDER — GUAIFENESIN ER 600 MG PO TB12
600.0000 mg | ORAL_TABLET | Freq: Two times a day (BID) | ORAL | Status: DC
  Administered 2015-04-02 – 2015-04-04 (×5): 600 mg via ORAL
  Filled 2015-04-02 (×6): qty 1

## 2015-04-02 MED ORDER — ALBUTEROL SULFATE (2.5 MG/3ML) 0.083% IN NEBU: 2.5000 mg | INHALATION_SOLUTION | RESPIRATORY_TRACT | Status: DC | PRN

## 2015-04-02 MED ORDER — SODIUM CHLORIDE 0.9% FLUSH
3.0000 mL | Freq: Two times a day (BID) | INTRAVENOUS | Status: DC
  Administered 2015-04-02 – 2015-04-04 (×4): 3 mL via INTRAVENOUS

## 2015-04-02 NOTE — Progress Notes (Signed)
  Echocardiogram 2D Echocardiogram has been performed.  Joanna [name] 04/02/2015, 4:11 PM

## 2015-04-02 NOTE — Progress Notes (Signed)
[facility]                                                                              Patient Demographics  Joanna [name], is a 55 y.o. female, DOB - 1960-05-28, NAT:[id number]  Admit date - 04/01/2015   Admitting Physician Modena Jansky, MD  Outpatient Primary MD for the patient is Pcp Not In System  LOS - 1   Chief Complaint  Patient presents with  . Shortness of Breath      HPI on 04/01/2015 by Dr. Verner Mould Joanna [name] is a 55 y.o. female , single, lives with her brother, independent of activities of daily living, denies knowledge of any past medical history and does not see a physician on a regular basis, uses when necessary BC powder for aches and pains but not on any prescription medications, presented to [facility] ED on 04/01/15 with complaints of generalized weakness, productive cough, dyspnea, wheezing, nausea, vomiting, diarrhea and fevers. Upon chart review, cardiology discharge summary 06/01/621: Acute systolic Congestive heart failure, hypertensive nonischemic cardiomyopathy, LVEF 30-35 percent with diffuse hypokinesis and moderate mitral regurgitation, HTN, tobacco abuse, obesity, hyperglycemia without history of diabetes. LHC 08/31/2005: Normal coronaries with EF 25-30 percent. As stated above, patient has not followed with any physicians. History obtained from patient and her family (brother and sister at bedside). She apparently was in her usual state of health until 2 weeks ago. 2 weeks ago, she brought a friend who was sick with complaints of cough and vomiting to the ED and states that since then she caught the illness. She gives 2 weeks history of generalized weakness, cough productive of intermittent yellow sputum, dyspnea, wheezing, fever of unknown severity, nausea, nonbloody emesis and diarrhea, decreased oral intake. She denies headache, earache, sore throat, chest pain, dysuria, urinary frequency or open skin wounds. She had been progressively worsening  and despite insistence by her brother, refused to come to the hospital for evaluation and to today.  In the ED, patient was hypoxic at 89% on room air, met sepsis criteria, chest x-ray suggestive of pneumonia. Lactate elevated. Acute kidney injury. Sepsis protocol was initiated and patient was aggressively hydrated with IV fluids and started on IV Rocephin and azithromycin. Patient starting to feel better. Reports no dyspnea at this time. Asking for ice cream to eat .Hospitalist admission was requested.  Assessment & Plan   Secondary to community-acquired pneumonia -Upon admission, tachycardic, febrile with leukocytosis -Chest x-ray: Mild bronchitic changes and cardiomegaly. Bibasilar airspace opacities, right greater than left -Continue azithromycin and Rocephin -Will need chest x-ray in 3-4 weeks to ensure resolution of pneumonia findings -Blood cultures pending -Influenza PCR negative  Acute respiratory failure with hypoxia/COPD exacerbation -Likely secondary to COPD exacerbation in the setting of pneumonia -Continue supplement oxygen to maintain saturations above 92% -Continue bronchodilators  Acute kidney injury  -Secondary to sepsis and dehydration along with NSAID use -Upon admission, creatinine was 1.63 -Creatinine currently 0.8 -Continue to monitor BMP  Tobacco abuse -Discussed smoking cessation -Continue nicotine patch  Nausea, vomiting, diarrhea -Appear to have resolved -C. difficile PCR and GI pathogen panel pending -Continue supportive treatment  Chronic systolic heart failure/nonischemic cardiomyopathy -Clinically dehydrated -Will continue to  monitor her fluid overload given her sepsis and IV fluid resuscitation -Continue to monitor intake and output, daily weight -Echocardiogram in 2009 showed EF of 45%  Essential hypertension -Currently on home medications, continue to monitor closely  Prolonged QTC -Mildly improved, continue to monitor  Code Status:  Full  Family Communication: None at bedside  Disposition Plan: Admitted  Time Spent in minutes   30 minutes  Procedures  None  Consults   none  DVT Prophylaxis  lovenox  Lab Results  Component Value Date   PLT 328 04/02/2015    Medications  Scheduled Meds: . azithromycin  500 mg Intravenous Q24H  . cefTRIAXone (ROCEPHIN)  IV  1 g Intravenous Q24H  . enoxaparin (LOVENOX) injection  40 mg Subcutaneous Q24H  . guaiFENesin  600 mg Oral BID  . ipratropium-albuterol  3 mL Nebulization Q6H  . nicotine  21 mg Transdermal Daily  . pantoprazole (PROTONIX) IV  40 mg Intravenous Q24H  . sodium chloride flush  3 mL Intravenous Q12H   Continuous Infusions: . sodium chloride Stopped (04/01/15 1757)   PRN Meds:.acetaminophen **OR** acetaminophen, albuterol, alum & mag hydroxide-simeth, guaiFENesin-dextromethorphan, ondansetron **OR** ondansetron (ZOFRAN) IV  Antibiotics    Anti-infectives    Start     Dose/Rate Route Frequency Ordered Stop   04/02/15 1500  cefTRIAXone (ROCEPHIN) 1 g in dextrose 5 % 50 mL IVPB     1 g 100 mL/hr over 30 Minutes Intravenous Every 24 hours 04/01/15 1443     04/02/15 1500  azithromycin (ZITHROMAX) 500 mg in dextrose 5 % 250 mL IVPB     500 mg 250 mL/hr over 60 Minutes Intravenous Every 24 hours 04/01/15 1443     04/01/15 1430  cefTRIAXone (ROCEPHIN) 1 g in dextrose 5 % 50 mL IVPB     1 g 100 mL/hr over 30 Minutes Intravenous  Once 04/01/15 1422 04/01/15 1533   04/01/15 1430  azithromycin (ZITHROMAX) 500 mg in dextrose 5 % 250 mL IVPB     500 mg 250 mL/hr over 60 Minutes Intravenous  Once 04/01/15 1422 04/01/15 1631      Subjective:   Joanna [name] seen and examined today.  Patient states she's feeling much better today. Denies any further nausea vomiting or diarrhea. States that her diarrhea started when she was coughing very hard.  Currently denies any chest pain, abdominal pain, dizziness or headache.  Objective:   Filed Vitals:    04/02/15 1130 04/02/15 1145 04/02/15 1304 04/02/15 1305  BP: 138/90 142/89 92/71   Pulse: 72 80 89   Temp:    98 F (36.7 C)  TempSrc:    Oral  Resp: 19 25 25    Height:    6' (1.829 m)  Weight:    103.874 kg (229 lb)  SpO2: 97% 93% 91% 95%    Wt Readings from Last 3 Encounters:  04/02/15 103.874 kg (229 lb)    No intake or output data in the 24 hours ending 04/02/15 1344  Exam  General: Well developed, well nourished, NAD, appears stated age  HEENT: NCAT,  mucous membranes dry   Cardiovascular: S1 S2 auscultated, no rubs, murmurs or gallops. Regular rate and rhythm.  Respiratory: Diminished breath sounds, expiratory wheezing  Abdomen: Soft, nontender, nondistended, + bowel sounds  Extremities: warm dry without cyanosis clubbing or edema  Neuro: AAOx3, nonfocal  Psych: Normal affect and demeanor   Data Review   Micro Results Recent Results (from the past 240 hour(s))  Blood Culture (routine x  2)     Status: None (Preliminary result)   Collection Time: 04/01/15  3:47 PM  Result Value Ref Range Status   Specimen Description BLOOD RIGHT ANTECUBITAL  Final   Special Requests BOTTLES DRAWN AEROBIC AND ANAEROBIC 5CSS  Final   Culture NO GROWTH < 24 HOURS  Final   Report Status PENDING  Incomplete    Radiology Reports Dg Chest 2 View  04/01/2015  CLINICAL DATA:  Diaphoresis, shortness of breath, cough, mid sternal chest tightness, vomiting and weakness for 2 weeks. EXAM: CHEST  2 VIEW COMPARISON:  09/01/2005 FINDINGS: Heart is mildly enlarged. There are bibasilar airspace opacities, most confluent at the right lung base. No effusions. No acute bony abnormality. Mild peribronchial thickening and interstitial prominence. IMPRESSION: Mild bronchitic changes and cardiomegaly. Bibasilar airspace opacities, right greater than left, atelectasis versus pneumonia. Electronically Signed   By: Rolm Baptise M.D.   On: 04/01/2015 15:23    CBC  Recent Labs Lab 04/01/15 1348  04/01/15 1546 04/02/15 0847  WBC 27.7*  --  16.4*  HGB 13.7  --  11.6*  HCT 41.7  --  36.9  PLT 356  --  328  MCV 86.9  --  87.2  MCH 28.5  --  27.4  MCHC 32.9  --  31.4  RDW 14.0  --  14.3  LYMPHSABS  --  2.0  --   MONOABS  --  0.8  --     Chemistries   Recent Labs Lab 04/01/15 1348 04/02/15 0847  NA 138 140  K 3.5 3.2*  CL 106 112*  CO2 17* 19*  GLUCOSE 149* 126*  BUN 41* 28*  CREATININE 1.63* 0.80  CALCIUM 9.7 9.0  AST 18  --   ALT 12*  --   ALKPHOS 82  --   BILITOT 0.5  --    ------------------------------------------------------------------------------------------------------------------ estimated creatinine clearance is 107.1 mL/min (by C-G formula based on Cr of 0.8). ------------------------------------------------------------------------------------------------------------------ No results for input(s): HGBA1C in the last 72 hours. ------------------------------------------------------------------------------------------------------------------ No results for input(s): CHOL, HDL, LDLCALC, TRIG, CHOLHDL, LDLDIRECT in the last 72 hours. ------------------------------------------------------------------------------------------------------------------ No results for input(s): TSH, T4TOTAL, T3FREE, THYROIDAB in the last 72 hours.  Invalid input(s): FREET3 ------------------------------------------------------------------------------------------------------------------ No results for input(s): VITAMINB12, FOLATE, FERRITIN, TIBC, IRON, RETICCTPCT in the last 72 hours.  Coagulation profile No results for input(s): INR, PROTIME in the last 168 hours.  No results for input(s): DDIMER in the last 72 hours.  Cardiac Enzymes No results for input(s): CKMB, TROPONINI, MYOGLOBIN in the last 168 hours.  Invalid input(s): CK ------------------------------------------------------------------------------------------------------------------ Invalid input(s):  POCBNP    Joanna [name] D.O. on 04/02/2015 at 1:44 PM  Between 7am to 7pm - Pager - [phone]  After 7pm go to [email or web address] - password [email or web address]  And look for the night coverage person covering for me after hours  [facility] Office  [phone]

## 2015-04-03 LAB — URINE CULTURE

## 2015-04-03 LAB — BASIC METABOLIC PANEL
ANION GAP: 9 (ref 5–15)
BUN: 17 mg/dL (ref 6–20)
CALCIUM: 8.6 mg/dL — AB (ref 8.9–10.3)
CO2: 20 mmol/L — ABNORMAL LOW (ref 22–32)
CREATININE: 0.6 mg/dL (ref 0.44–1.00)
Chloride: 115 mmol/L — ABNORMAL HIGH (ref 101–111)
GLUCOSE: 110 mg/dL — AB (ref 65–99)
Potassium: 3.2 mmol/L — ABNORMAL LOW (ref 3.5–5.1)
Sodium: 144 mmol/L (ref 135–145)

## 2015-04-03 LAB — MRSA PCR SCREENING: MRSA BY PCR: NEGATIVE

## 2015-04-03 LAB — CBC
HCT: 33.4 % — ABNORMAL LOW (ref 36.0–46.0)
HEMOGLOBIN: 10.7 g/dL — AB (ref 12.0–15.0)
MCH: 28.1 pg (ref 26.0–34.0)
MCHC: 32 g/dL (ref 30.0–36.0)
MCV: 87.7 fL (ref 78.0–100.0)
Platelets: 340 10*3/uL (ref 150–400)
RBC: 3.81 MIL/uL — ABNORMAL LOW (ref 3.87–5.11)
RDW: 14.3 % (ref 11.5–15.5)
WBC: 12 10*3/uL — ABNORMAL HIGH (ref 4.0–10.5)

## 2015-04-03 MED ORDER — PANTOPRAZOLE SODIUM 40 MG PO TBEC
40.0000 mg | DELAYED_RELEASE_TABLET | Freq: Every day | ORAL | Status: DC
  Administered 2015-04-04: 40 mg via ORAL
  Filled 2015-04-03: qty 1

## 2015-04-03 MED ORDER — AZITHROMYCIN 250 MG PO TABS
500.0000 mg | ORAL_TABLET | Freq: Every day | ORAL | Status: DC
  Administered 2015-04-03 – 2015-04-04 (×2): 500 mg via ORAL
  Filled 2015-04-03: qty 1
  Filled 2015-04-03: qty 2

## 2015-04-03 MED ORDER — LISINOPRIL 10 MG PO TABS
10.0000 mg | ORAL_TABLET | Freq: Every day | ORAL | Status: DC
  Administered 2015-04-03 – 2015-04-04 (×2): 10 mg via ORAL
  Filled 2015-04-03 (×2): qty 1

## 2015-04-03 MED ORDER — WHITE PETROLATUM GEL
Status: AC
  Administered 2015-04-03: 17:00:00
  Filled 2015-04-03: qty 1

## 2015-04-03 MED ORDER — IPRATROPIUM-ALBUTEROL 0.5-2.5 (3) MG/3ML IN SOLN
3.0000 mL | Freq: Three times a day (TID) | RESPIRATORY_TRACT | Status: DC
  Administered 2015-04-03 – 2015-04-04 (×2): 3 mL via RESPIRATORY_TRACT
  Filled 2015-04-03 (×3): qty 3

## 2015-04-03 MED ORDER — POTASSIUM CHLORIDE CRYS ER 20 MEQ PO TBCR
40.0000 meq | EXTENDED_RELEASE_TABLET | Freq: Once | ORAL | Status: AC
  Administered 2015-04-03: 40 meq via ORAL
  Filled 2015-04-03: qty 2

## 2015-04-03 NOTE — Evaluation (Signed)
Physical Therapy Evaluation Patient Details Name: Joanna [name] MRN: [id number] DOB: 1960-12-18 Today's Date: 04/03/2015   History of Present Illness  Pt is a 55 y/o F admitted w/ generalized weakness, productive cough, dyspnea, wheezing, fever, nausea, emesis, and diarrhea.  Pt meets sepsis criteria secondary to community acquired pneumonia.  She has acute kidney injury due to sepsis and dehydration along w/ NSAID use and is experiencing COPD exacerbation/acute respiratory failure w/ hypoxia.  Pt's PMH includes chronic systolic heart failure, MI.   Clinical Impression  Pt admitted with above diagnosis. Pt currently with functional limitations due to the deficits listed below (see PT Problem List). [name] received alert and oriented but somewhat confused.  She requires min guard assist for safe ambulation w/ SpO2 down as low as 87% on RA. She will have 24/7 assist available from her sisters at d/c if needed.  Pt will benefit from skilled PT to increase their independence and safety with mobility to allow discharge to the venue listed below.      Follow Up Recommendations No PT follow up;Supervision for mobility/OOB    Equipment Recommendations  None recommended by PT    Recommendations for Other Services       Precautions / Restrictions Precautions Precautions: Fall Restrictions Weight Bearing Restrictions: No      Mobility  Bed Mobility Overal bed mobility: Independent             General bed mobility comments: no cues or physical assist needed  Transfers Overall transfer level: Needs assistance Equipment used: None Transfers: Sit to/from Stand Sit to Stand: Supervision         General transfer comment: Very min instability noted, supervision provided for safety.  Ambulation/Gait Ambulation/Gait assistance: Min guard Ambulation Distance (Feet): 70 Feet Assistive device: None Gait Pattern/deviations: Step-through pattern;Decreased stride length   Gait  velocity interpretation: Below normal speed for age/gender General Gait Details: Dec stride length and pt begins taking smaller steps to avoid obstacles in hallway.  She requries 4 standing rest breaks which she says is so she can look around.  SpO2 as low as 87% on RA but quickly bounces up to 90% within 10 seconds of pursed lip breathing (demonstration provided).  HR up to 126.  Stairs            Wheelchair Mobility    Modified Rankin (Stroke Patients Only)       Balance Overall balance assessment: Needs assistance (denies h/o falls in the past 6 months) Sitting-balance support: No upper extremity supported;Feet supported Sitting balance-Leahy Scale: Good     Standing balance support: No upper extremity supported;During functional activity Standing balance-Leahy Scale: Good                               Pertinent Vitals/Pain Pain Assessment: No/denies pain    Home Living Family/patient expects to be discharged to:: Private residence Living Arrangements: Other relatives (brother) Available Help at Discharge: Family;Available 24 hours/day Type of Home: House Home Access: Level entry     Home Layout: One level Home Equipment: None Additional Comments: Lives w/ her brother who also works but her sisters do not work and are availalbe to assist pt 24/7 if necessary.    Prior Function Level of Independence: Independent         Comments: Pt reports that she is working but unclear her job title and how many days each week.  Initially says 7 days/wk but  then 3x/wk and sometimes the weekend.  Pt is a poor historian.     Hand Dominance        Extremity/Trunk Assessment   Upper Extremity Assessment: Overall WFL for tasks assessed           Lower Extremity Assessment: Overall WFL for tasks assessed         Communication   Communication: HOH  Cognition Arousal/Alertness: Awake/alert Behavior During Therapy: WFL for tasks assessed/performed (very  pleasant) Overall Cognitive Status: No family/caregiver present to determine baseline cognitive functioning Area of Impairment: Problem solving             Problem Solving: Slow processing General Comments: Pt requires increased time to report the date and often does not answer the question asked (may partially be contributed to her apparent [facility]).  Pt able to direct therapist back to her room w/o cues.    General Comments      Exercises        Assessment/Plan    PT Assessment Patient needs continued PT services  PT Diagnosis Difficulty walking   PT Problem List Decreased activity tolerance;Decreased balance  PT Treatment Interventions DME instruction;Gait training;Functional mobility training;Therapeutic activities;Therapeutic exercise;Balance training;Patient/family education;Cognitive remediation   PT Goals (Current goals can be found in the Care Plan section) Acute Rehab PT Goals Patient Stated Goal: to go home PT Goal Formulation: With patient Time For Goal Achievement: 04/17/15 Potential to Achieve Goals: Good    Frequency Min 3X/week   Barriers to discharge        Co-evaluation               End of Session Equipment Utilized During Treatment: Gait belt Activity Tolerance: Patient tolerated treatment well;Patient limited by fatigue Patient left: in chair;with chair alarm set;with call bell/phone within reach;Other (comment) (w/ respiratory therapist in room w/ pt) Nurse Communication: Mobility status;Other (comment) (SpO2, HR)         Time: 1610-9604 PT Time Calculation (min) (ACUTE ONLY): 25 min   Charges:   PT Evaluation $PT Eval Low Complexity: 1 Procedure PT Treatments $Gait Training: 8-22 mins   PT G Codes:       Michail Jewels PT, DPT [phone] Pager: [phone] 04/03/2015, 2:35 PM

## 2015-04-03 NOTE — Progress Notes (Signed)
Called report to [employer], will transfer pt in wheelchair. Pt on room air and off telemetry. Marisue Ivan RN

## 2015-04-03 NOTE — Progress Notes (Signed)
[facility]                                                                              Patient Demographics  Joanna [name], is a 55 y.o. female, DOB - [birth year], BUL:[id number]  Admit date - 04/01/2015   Admitting Physician Modena Jansky, MD  Outpatient Primary MD for the patient is Pcp Not In System  LOS - 2   Chief Complaint  Patient presents with  . Shortness of Breath      HPI on 04/01/2015 by Dr. Verner Mould Joanna [name] is a 55 y.o. female , single, lives with her brother, independent of activities of daily living, denies knowledge of any past medical history and does not see a physician on a regular basis, uses when necessary BC powder for aches and pains but not on any prescription medications, presented to [facility] ED on 04/01/15 with complaints of generalized weakness, productive cough, dyspnea, wheezing, nausea, vomiting, diarrhea and fevers. Upon chart review, cardiology discharge summary 04/26/2246: Acute systolic Congestive heart failure, hypertensive nonischemic cardiomyopathy, LVEF 30-35 percent with diffuse hypokinesis and moderate mitral regurgitation, HTN, tobacco abuse, obesity, hyperglycemia without history of diabetes. LHC 08/31/2005: Normal coronaries with EF 25-30 percent. As stated above, patient has not followed with any physicians. History obtained from patient and her family (brother and sister at bedside). She apparently was in her usual state of health until 2 weeks ago. 2 weeks ago, she brought a friend who was sick with complaints of cough and vomiting to the ED and states that since then she caught the illness. She gives 2 weeks history of generalized weakness, cough productive of intermittent yellow sputum, dyspnea, wheezing, fever of unknown severity, nausea, nonbloody emesis and diarrhea, decreased oral intake. She denies headache, earache, sore throat, chest pain, dysuria, urinary frequency or open skin wounds. She had been progressively worsening  and despite insistence by her brother, refused to come to the hospital for evaluation and to today.  In the ED, patient was hypoxic at 89% on room air, met sepsis criteria, chest x-ray suggestive of pneumonia. Lactate elevated. Acute kidney injury. Sepsis protocol was initiated and patient was aggressively hydrated with IV fluids and started on IV Rocephin and azithromycin. Patient starting to feel better. Reports no dyspnea at this time. Asking for ice cream to eat .Hospitalist admission was requested.  Assessment & Plan   Secondary to community-acquired pneumonia -Upon admission, tachycardic, febrile with leukocytosis -Leukocytosis improving, currently afebrile -Chest x-ray: Mild bronchitic changes and cardiomegaly. Bibasilar airspace opacities, right greater than left -Continue azithromycin and Rocephin -Will need chest x-ray in 3-4 weeks to ensure resolution of pneumonia findings -Blood cultures pending -Influenza PCR negative  Acute respiratory failure with hypoxia/COPD exacerbation -Likely secondary to COPD exacerbation in the setting of pneumonia -Continue supplement oxygen to maintain saturations above 92% -Continue bronchodilators  Acute kidney injury  -Secondary to sepsis and dehydration along with NSAID use -Upon admission, creatinine was 1.63 -Creatinine currently 0.6 -Continue to monitor BMP  Tobacco abuse -Discussed smoking cessation -Continue nicotine patch  Nausea, vomiting, diarrhea -Appear to have resolved -C. difficile PCR and GI pathogen panel pending -Continue supportive treatment  Chronic systolic heart failure/nonischemic cardiomyopathy -Clinically  dehydrated -Will continue to monitor her fluid overload given her sepsis and IV fluid resuscitation -Continue to monitor intake and output, daily weight -Echocardiogram in 2009 showed EF of 45% -Echocardiogram: EF 27-74%, grade 1 diastolic dysfunction  Essential hypertension -Currently on home  medications -Will start patient on lisinopril   Prolonged QTC -Mildly improved, continue to monitor  Code Status: Full  Family Communication: None at bedside  Disposition Plan: Admitted. Will transfer out of step down.    Time Spent in minutes   30 minutes  Procedures  Echocardiogram  Consults   none  DVT Prophylaxis  lovenox  Lab Results  Component Value Date   PLT 340 04/03/2015    Medications  Scheduled Meds: . azithromycin  500 mg Intravenous Q24H  . cefTRIAXone (ROCEPHIN)  IV  1 g Intravenous Q24H  . enoxaparin (LOVENOX) injection  40 mg Subcutaneous Q24H  . guaiFENesin  600 mg Oral BID  . ipratropium-albuterol  3 mL Nebulization Q6H  . nicotine  21 mg Transdermal Daily  . pantoprazole (PROTONIX) IV  40 mg Intravenous Q24H  . sodium chloride flush  3 mL Intravenous Q12H   Continuous Infusions: . sodium chloride 1,000 mL (04/03/15 0945)   PRN Meds:.acetaminophen **OR** acetaminophen, albuterol, alum & mag hydroxide-simeth, guaiFENesin-dextromethorphan, ondansetron **OR** ondansetron (ZOFRAN) IV  Antibiotics    Anti-infectives    Start     Dose/Rate Route Frequency Ordered Stop   04/02/15 1500  cefTRIAXone (ROCEPHIN) 1 g in dextrose 5 % 50 mL IVPB     1 g 100 mL/hr over 30 Minutes Intravenous Every 24 hours 04/01/15 1443     04/02/15 1500  azithromycin (ZITHROMAX) 500 mg in dextrose 5 % 250 mL IVPB     500 mg 250 mL/hr over 60 Minutes Intravenous Every 24 hours 04/01/15 1443     04/01/15 1430  cefTRIAXone (ROCEPHIN) 1 g in dextrose 5 % 50 mL IVPB     1 g 100 mL/hr over 30 Minutes Intravenous  Once 04/01/15 1422 04/01/15 1533   04/01/15 1430  azithromycin (ZITHROMAX) 500 mg in dextrose 5 % 250 mL IVPB     500 mg 250 mL/hr over 60 Minutes Intravenous  Once 04/01/15 1422 04/01/15 1631      Subjective:   Nishi Neiswonger seen and examined today.  Patient is feeling better today. Continues to have cough.  Denies nausea or vomiting. Has some stool  incontinence with cough.   Currently denies chest pain, abdominal pain, dizziness or headache.  Objective:   Filed Vitals:   04/03/15 0221 04/03/15 0327 04/03/15 0751 04/03/15 0910  BP:  132/84 144/92   Pulse: 88 81 85   Temp:  98.2 F (36.8 C) 97.5 F (36.4 C)   TempSrc:  Oral Oral   Resp: 13 20 18    Height:      Weight:  103.012 kg (227 lb 1.6 oz)    SpO2: 99% 96% 94% 100%    Wt Readings from Last 3 Encounters:  04/03/15 103.012 kg (227 lb 1.6 oz)     Intake/Output Summary (Last 24 hours) at 04/03/15 1119 Last data filed at 04/03/15 0945  Gross per 24 hour  Intake 3022.08 ml  Output      0 ml  Net 3022.08 ml    Exam  General: Well developed, well nourished, NAD  HEENT: NCAT,  mucous membranes dry   Cardiovascular: S1 S2 auscultated, RRR, no murmurs  Respiratory: Diminished breath sounds, expiratory wheezing  Abdomen: Soft, nontender, nondistended, + bowel  sounds  Extremities: warm dry without cyanosis clubbing or edema  Neuro: AAOx3, nonfocal  Psych: Normal affect and demeanor, pleasant  Data Review   Micro Results Recent Results (from the past 240 hour(s))  Blood Culture (routine x 2)     Status: None (Preliminary result)   Collection Time: 04/01/15  3:47 PM  Result Value Ref Range Status   Specimen Description BLOOD RIGHT ANTECUBITAL  Final   Special Requests BOTTLES DRAWN AEROBIC AND ANAEROBIC 5CSS  Final   Culture NO GROWTH 1 DAY  Final   Report Status PENDING  Incomplete  MRSA PCR Screening     Status: None   Collection Time: 04/03/15  7:23 AM  Result Value Ref Range Status   MRSA by PCR NEGATIVE NEGATIVE Final    Comment:        The GeneXpert MRSA Assay (FDA approved for NASAL specimens only), is one component of a comprehensive MRSA colonization surveillance program. It is not intended to diagnose MRSA infection nor to guide or monitor treatment for MRSA infections.     Radiology Reports Dg Chest 2 View  04/01/2015  CLINICAL  DATA:  Diaphoresis, shortness of breath, cough, mid sternal chest tightness, vomiting and weakness for 2 weeks. EXAM: CHEST  2 VIEW COMPARISON:  09/01/2005 FINDINGS: Heart is mildly enlarged. There are bibasilar airspace opacities, most confluent at the right lung base. No effusions. No acute bony abnormality. Mild peribronchial thickening and interstitial prominence. IMPRESSION: Mild bronchitic changes and cardiomegaly. Bibasilar airspace opacities, right greater than left, atelectasis versus pneumonia. Electronically Signed   By: Rolm Baptise M.D.   On: 04/01/2015 15:23    CBC  Recent Labs Lab 04/01/15 1348 04/01/15 1546 04/02/15 0847 04/03/15 0500  WBC 27.7*  --  16.4* 12.0*  HGB 13.7  --  11.6* 10.7*  HCT 41.7  --  36.9 33.4*  PLT 356  --  328 340  MCV 86.9  --  87.2 87.7  MCH 28.5  --  27.4 28.1  MCHC 32.9  --  31.4 32.0  RDW 14.0  --  14.3 14.3  LYMPHSABS  --  2.0  --   --   MONOABS  --  0.8  --   --     Chemistries   Recent Labs Lab 04/01/15 1348 04/02/15 0847 04/03/15 0500  NA 138 140 144  K 3.5 3.2* 3.2*  CL 106 112* 115*  CO2 17* 19* 20*  GLUCOSE 149* 126* 110*  BUN 41* 28* 17  CREATININE 1.63* 0.80 0.60  CALCIUM 9.7 9.0 8.6*  AST 18  --   --   ALT 12*  --   --   ALKPHOS 82  --   --   BILITOT 0.5  --   --    ------------------------------------------------------------------------------------------------------------------ estimated creatinine clearance is 106.7 mL/min (by C-G formula based on Cr of 0.6). ------------------------------------------------------------------------------------------------------------------ No results for input(s): HGBA1C in the last 72 hours. ------------------------------------------------------------------------------------------------------------------ No results for input(s): CHOL, HDL, LDLCALC, TRIG, CHOLHDL, LDLDIRECT in the last 72  hours. ------------------------------------------------------------------------------------------------------------------ No results for input(s): TSH, T4TOTAL, T3FREE, THYROIDAB in the last 72 hours.  Invalid input(s): FREET3 ------------------------------------------------------------------------------------------------------------------ No results for input(s): VITAMINB12, FOLATE, FERRITIN, TIBC, IRON, RETICCTPCT in the last 72 hours.  Coagulation profile No results for input(s): INR, PROTIME in the last 168 hours.  No results for input(s): DDIMER in the last 72 hours.  Cardiac Enzymes No results for input(s): CKMB, TROPONINI, MYOGLOBIN in the last 168 hours.  Invalid input(s): CK ------------------------------------------------------------------------------------------------------------------ Invalid input(s):  POCBNP    Alyannah Sanks D.O. on 04/03/2015 at 11:19 AM  Between 7am to 7pm - Pager - [phone]  After 7pm go to [email or web address] - password [email or web address]  And look for the night coverage person covering for me after hours  [facility] Office  [phone]

## 2015-04-04 LAB — C DIFFICILE QUICK SCREEN W PCR REFLEX
C Diff antigen: NEGATIVE
C Diff interpretation: NEGATIVE
C Diff toxin: NEGATIVE

## 2015-04-04 LAB — CBC
HCT: 34.6 % — ABNORMAL LOW (ref 36.0–46.0)
Hemoglobin: 10.6 g/dL — ABNORMAL LOW (ref 12.0–15.0)
MCH: 26.8 pg (ref 26.0–34.0)
MCHC: 30.6 g/dL (ref 30.0–36.0)
MCV: 87.4 fL (ref 78.0–100.0)
PLATELETS: 502 10*3/uL — AB (ref 150–400)
RBC: 3.96 MIL/uL (ref 3.87–5.11)
RDW: 14.2 % (ref 11.5–15.5)
WBC: 10.9 10*3/uL — AB (ref 4.0–10.5)

## 2015-04-04 LAB — GASTROINTESTINAL PANEL BY PCR, STOOL (REPLACES STOOL CULTURE)

## 2015-04-04 LAB — BASIC METABOLIC PANEL
ANION GAP: 10 (ref 5–15)
BUN: 6 mg/dL (ref 6–20)
CALCIUM: 8.8 mg/dL — AB (ref 8.9–10.3)
CO2: 23 mmol/L (ref 22–32)
CREATININE: 0.55 mg/dL (ref 0.44–1.00)
Chloride: 114 mmol/L — ABNORMAL HIGH (ref 101–111)
GFR calc Af Amer: >60 mL/min (ref >60)
GLUCOSE: 120 mg/dL — AB (ref 65–99)
Potassium: 3.4 mmol/L — ABNORMAL LOW (ref 3.5–5.1)
Sodium: 147 mmol/L — ABNORMAL HIGH (ref 135–145)

## 2015-04-04 MED ORDER — AZITHROMYCIN 250 MG PO TABS: 250.0000 mg | ORAL_TABLET | Freq: Every day | ORAL | Status: DC

## 2015-04-04 MED ORDER — NICOTINE 21 MG/24HR TD PT24: 21.0000 mg | MEDICATED_PATCH | Freq: Every day | TRANSDERMAL | Status: DC

## 2015-04-04 MED ORDER — CEFUROXIME AXETIL 500 MG PO TABS: 500.0000 mg | ORAL_TABLET | Freq: Two times a day (BID) | ORAL | Status: DC

## 2015-04-04 MED ORDER — LISINOPRIL 10 MG PO TABS: 10.0000 mg | ORAL_TABLET | Freq: Every day | ORAL | Status: DC

## 2015-04-04 MED ORDER — GUAIFENESIN-DM 100-10 MG/5ML PO SYRP: 5.0000 mL | ORAL_SOLUTION | ORAL | Status: DC | PRN

## 2015-04-04 MED ORDER — GUAIFENESIN ER 600 MG PO TB12: 600.0000 mg | ORAL_TABLET | Freq: Two times a day (BID) | ORAL | Status: DC

## 2015-04-04 MED ORDER — ALBUTEROL SULFATE HFA 108 (90 BASE) MCG/ACT IN AERS: 2.0000 | INHALATION_SPRAY | Freq: Four times a day (QID) | RESPIRATORY_TRACT | Status: DC | PRN

## 2015-04-04 NOTE — Progress Notes (Signed)
IV removed. AVS given to patient. Understanding demonstrated.  Belongings packed. Transportation arranged by patient.  

## 2015-04-04 NOTE — Discharge Instructions (Signed)
Acute Respiratory Distress Syndrome Acute respiratory distress syndrome is a life-threatening condition in which fluid collects in the lungs. This condition can cause severe shortness of breath and low oxygen levels in the blood. It can also cause the lungs and other vital organs to fail. The condition usually develops within 24 to 48 hours of an infection, illness, surgery, or injury. CAUSES This condition may be caused by:  An infection, such as sepsis or pneumonia.  A serious injury to the head or chest.  A major surgery.  A drug overdose.  Breathing in harmful chemicals or smoke.  Blood transfusions.  A blood clot in the lungs. SYMPTOMS Symptoms of this condition include:  Fever.  Difficulty breathing.  Bluish skin (cyanosis).  A fast or irregular heartbeat.  Low blood pressure (hypotension).  Agitation.  Confusion.  Lack of energy (lethargy).  Sweating. DIAGNOSIS This condition may be diagnosed by using tests to rule out other diseases and conditions that cause similar symptoms. You may have:  A chest X-ray or CT scan.  Blood tests.  A sputum culture. In this test, you will be asked to spit so that a sample of lung fluid can be taken for testing.  Bronchoscopy. During this test a thin, flexible tool is passed into the mouth or nose, down the windpipe, and into the lungs. TREATMENT This condition may be treated with:  Oxygen. A breathing machine (ventilator) is often used to provide oxygen and help with breathing.  Medicine to help you relax (sedative).  Fluids and nutrients given through an IV tube.  Blood pressure medicine.  Steroid medicine to help decrease inflammation in the lungs.  Diuretic medicine to get rid of extra fluid in the body. Additional treatment may be needed, depending on the cause of the condition. It can take up to 12 months to recover from this condition. Some people recover fully, but others may continue to  have:  Weakness.  Shortness of breath.  Memory problems.  Depression. HOME CARE INSTRUCTIONS Until you recover from this condition:  Do not smoke.  Limit alcohol intake to no more than 1 drink per day for nonpregnant women and 2 drinks per day for men. One drink equals 12 oz of beer, 5 oz of wine, or 1 oz of hard liquor.  Ask friends and family to help you if daily activities make you tired. SEEK MEDICAL CARE IF:  You become short of breath with activity or while resting.  You develop a cough that does not go away.  You have a fever. SEEK IMMEDIATE MEDICAL CARE IF:  You have sudden shortness of breath.  You develop chest pain that does not go away.  You develop swelling or pain in one of your legs.  You cough up blood.   This information is not intended to replace advice given to you by your health care provider. Make sure you discuss any questions you have with your health care provider.   Document Released: 01/22/2005 Document Revised: 06/08/2014 Document Reviewed: 01/18/2014 Elsevier Interactive Patient Education 2016 [employer] Community-Acquired Pneumonia, Adult Pneumonia is an infection of the lungs. There are different types of pneumonia. One type can develop while a person is in a hospital. A different type, called community-acquired pneumonia, develops in people who are not, or have not recently been, in the hospital or other health care facility.  CAUSES Pneumonia may be caused by bacteria, viruses, or funguses. Community-acquired pneumonia is often caused by Streptococcus pneumonia bacteria. These bacteria are often passed  from one person to another by breathing in droplets from the cough or sneeze of an infected person. RISK FACTORS The condition is more likely to develop in:  People who havechronic diseases, such as chronic obstructive pulmonary disease (COPD), asthma, congestive heart failure, cystic fibrosis, diabetes, or kidney disease.  People who  haveearly-stage or late-stage HIV.  People who havesickle cell disease.  People who havehad their spleen removed (splenectomy).  People who havepoor Administrator.  People who havemedical conditions that increase the risk of breathing in (aspirating) secretions their own mouth and nose.   People who havea weakened immune system (immunocompromised).  People who smoke.  People whotravel to areas where pneumonia-causing germs commonly exist.  People whoare around animal habitats or animals that have pneumonia-causing germs, including birds, bats, rabbits, cats, and farm animals. SYMPTOMS Symptoms of this condition include:  Adry cough.  A wet (productive) cough.  Fever.  Sweating.  Chest pain, especially when breathing deeply or coughing.  Rapid breathing or difficulty breathing.  Shortness of breath.  Shaking chills.  Fatigue.  Muscle aches. DIAGNOSIS Your health care provider will take a medical history and perform a physical exam. You may also have other tests, including:  Imaging studies of your chest, including X-rays.  Tests to check your blood oxygen level and other blood gases.  Other tests on blood, mucus (sputum), fluid around your lungs (pleural fluid), and urine. If your pneumonia is severe, other tests may be done to identify the specific cause of your illness. TREATMENT The type of treatment that you receive depends on many factors, such as the cause of your pneumonia, the medicines you take, and other medical conditions that you have. For most adults, treatment and recovery from pneumonia may occur at home. In some cases, treatment must happen in a hospital. Treatment may include:  Antibiotic medicines, if the pneumonia was caused by bacteria.  Antiviral medicines, if the pneumonia was caused by a virus.  Medicines that are given by mouth or through an IV tube.  Oxygen.  Respiratory therapy. Although rare, treating severe pneumonia  may include:  Mechanical ventilation. This is done if you are not breathing well on your own and you cannot maintain a safe blood oxygen level.  Thoracentesis. This procedureremoves fluid around one lung or both lungs to help you breathe better. HOME CARE INSTRUCTIONS  Take over-the-counter and prescription medicines only as told by your health care provider.  Only takecough medicine if you are losing sleep. Understand that cough medicine can prevent your body's natural ability to remove mucus from your lungs.  If you were prescribed an antibiotic medicine, take it as told by your health care provider. Do not stop taking the antibiotic even if you start to feel better.  Sleep in a semi-upright position at night. Try sleeping in a reclining chair, or place a few pillows under your head.  Do not use tobacco products, including cigarettes, chewing tobacco, and e-cigarettes. If you need help quitting, ask your health care provider.  Drink enough water to keep your urine clear or pale yellow. This will help to thin out mucus secretions in your lungs. PREVENTION There are ways that you can decrease your risk of developing community-acquired pneumonia. Consider getting a pneumococcal vaccine if:  You are older than 56 years of age.  You are older than 56 years of age and are undergoing cancer treatment, have chronic lung disease, or have other medical conditions that affect your immune system. Ask your health care  provider if this applies to you. There are different types and schedules of pneumococcal vaccines. Ask your health care provider which vaccination option is best for you. You may also prevent community-acquired pneumonia if you take these actions:  Get an influenza vaccine every year. Ask your health care provider which type of influenza vaccine is best for you.  Go to the dentist on a regular basis.  Wash your hands often. Use hand sanitizer if soap and water are not  available. SEEK MEDICAL CARE IF:  You have a fever.  You are losing sleep because you cannot control your cough with cough medicine. SEEK IMMEDIATE MEDICAL CARE IF:  You have worsening shortness of breath.  You have increased chest pain.  Your sickness becomes worse, especially if you are an older adult or have a weakened immune system.  You cough up blood.   This information is not intended to replace advice given to you by your health care provider. Make sure you discuss any questions you have with your health care provider.   Document Released: 01/22/2005 Document Revised: 10/13/2014 Document Reviewed: 05/19/2014 Elsevier Interactive Patient Education [employer]

## 2015-04-04 NOTE — Discharge Summary (Signed)
Physician Discharge Summary  Joanna [name] AUQ:[id number] DOB: 1960-03-21 DOA: 04/01/2015  PCP: Pcp Not In System  Admit date: 04/01/2015 Discharge date: 04/04/2015  Time spent: 45 minutes  Recommendations for Outpatient Follow-up:  Patient will be discharged to home.  Patient will need to follow up with primary care provider within one week of discharge.  Patient should continue medications as prescribed.  Patient should follow a heart healthy diet.   Discharge Diagnoses:  Sepsis secondary to community acquired pneumonia Acute respiratory failure with hypoxia/COPD exacerbation Acute kidney injury  tobacco abuse Nausea, vomiting, diarrhea Chronic systolic heart failure/nonischemic cardiomyopathy Essential hypertension  prolonged QTC  Discharge Condition: Stable  Diet recommendation: Heat healthy  Filed Weights   04/01/15 1425 04/02/15 1305 04/03/15 0327  Weight: 98.158 kg (216 lb 6.4 oz) 103.874 kg (229 lb) 103.012 kg (227 lb 1.6 oz)    History of present illness:  on 04/01/2015 by Dr. Verner Mould Joanna [name] is a 55 y.o. female , single, lives with her brother, independent of activities of daily living, denies knowledge of any past medical history and does not see a physician on a regular basis, uses when necessary BC powder for aches and pains but not on any prescription medications, presented to [facility] ED on 04/01/15 with complaints of generalized weakness, productive cough, dyspnea, wheezing, nausea, vomiting, diarrhea and fevers. Upon chart review, cardiology discharge summary 6/38/9373: Acute systolic Congestive heart failure, hypertensive nonischemic cardiomyopathy, LVEF 30-35 percent with diffuse hypokinesis and moderate mitral regurgitation, HTN, tobacco abuse, obesity, hyperglycemia without history of diabetes. LHC 08/31/2005: Normal coronaries with EF 25-30 percent. As stated above, patient has not followed with any physicians. History obtained from patient and her family  (brother and sister at bedside). She apparently was in her usual state of health until 2 weeks ago. 2 weeks ago, she brought a friend who was sick with complaints of cough and vomiting to the ED and states that since then she caught the illness. She gives 2 weeks history of generalized weakness, cough productive of intermittent yellow sputum, dyspnea, wheezing, fever of unknown severity, nausea, nonbloody emesis and diarrhea, decreased oral intake. She denies headache, earache, sore throat, chest pain, dysuria, urinary frequency or open skin wounds. She had been progressively worsening and despite insistence by her brother, refused to come to the hospital for evaluation and to today.  In the ED, patient was hypoxic at 89% on room air, met sepsis criteria, chest x-ray suggestive of pneumonia. Lactate elevated. Acute kidney injury. Sepsis protocol was initiated and patient was aggressively hydrated with IV fluids and started on IV Rocephin and azithromycin. Patient starting to feel better. Reports no dyspnea at this time. Asking for ice cream to eat .Hospitalist admission was requested.  Hospital Course:  Secondary to community-acquired pneumonia -Upon admission, tachycardic, febrile with leukocytosis -Leukocytosis improving, currently afebrile -Chest x-ray: Mild bronchitic changes and cardiomegaly. Bibasilar airspace opacities, right greater than left -Initially placed onazithromycin and Rocephin, will discharge with azithromycin and ceftin -Will need chest x-ray in 3-4 weeks to ensure resolution of pneumonia findings -Blood cultures show no growth to date -Influenza PCR negative  Acute respiratory failure with hypoxia/COPD exacerbation -Likely secondary to COPD exacerbation in the setting of pneumonia -Continue supplement oxygen to maintain saturations above 92% -Continue bronchodilators  Acute kidney injury  -Secondary to sepsis and dehydration along with NSAID use -Upon admission, creatinine  was 1.63 -Creatinine currently 0.5 -Continue to monitor BMP  Tobacco abuse -Discussed smoking cessation -Continue nicotine patch  Nausea, vomiting, diarrhea -  Appear to have resolved -C. difficile PCR nonreactive and GI pathogen panel pending -Continue supportive treatment -Patient finally admitted today that she had taken castor oil before coming the hospital  Chronic systolic heart failure/nonischemic cardiomyopathy -Clinically dehydrated -Will continue to monitor her fluid overload given her sepsis and IV fluid resuscitation -Continue to monitor intake and output, daily weight -Echocardiogram in 2009 showed EF of 45% -Echocardiogram: EF 69-67%, grade 1 diastolic dysfunction  Essential hypertension -Currently on home medications -Continue lisinopril   Prolonged QTC -Mildly improved, continue to monitor  Procedures  Echocardiogram  Consults  none  Discharge Exam: Filed Vitals:   04/03/15 2003 04/04/15 0450  BP: 160/91 164/89  Pulse: 76 79  Temp: 98.6 F (37 C) 99 F (37.2 C)  Resp: 19 19   Exam  General: Well developed, well nourished, NAD  HEENT: NCAT, mucous membranes moist   Cardiovascular: S1 S2 auscultated, RRR, no murmurs  Respiratory: Diminished but clear  Abdomen: Soft, nontender, nondistended, + bowel sounds  Extremities: warm dry without cyanosis clubbing or edema  Neuro: AAOx3, nonfocal  Psych: Normal affect and demeanor, pleasant  Discharge Instructions      Discharge Instructions    Discharge instructions    Complete by:  As directed   Patient will be discharged to home.  Patient will need to follow up with primary care provider within one week of discharge.  Patient should continue medications as prescribed.  Patient should follow a heart healthy diet.            Medication List    TAKE these medications        albuterol 108 (90 Base) MCG/ACT inhaler  Commonly known as:  PROVENTIL HFA;VENTOLIN HFA  Inhale 2 puffs into  the lungs every 6 (six) hours as needed for wheezing or shortness of breath.     azithromycin 250 MG tablet  Commonly known as:  ZITHROMAX  Take 1 tablet (250 mg total) by mouth daily.     BC FAST PAIN RELIEF 650-195-33.3 MG Pack  Generic drug:  Aspirin-Salicylamide-Caffeine  Take 1 Package by mouth daily as needed (for headaches).     cefUROXime 500 MG tablet  Commonly known as:  CEFTIN  Take 1 tablet (500 mg total) by mouth 2 (two) times daily with a meal.     guaiFENesin 600 MG 12 hr tablet  Commonly known as:  MUCINEX  Take 1 tablet (600 mg total) by mouth 2 (two) times daily.     guaiFENesin-dextromethorphan 100-10 MG/5ML syrup  Commonly known as:  ROBITUSSIN DM  Take 5 mLs by mouth every 4 (four) hours as needed for cough.     lisinopril 10 MG tablet  Commonly known as:  PRINIVIL,ZESTRIL  Take 1 tablet (10 mg total) by mouth daily.     nicotine 21 mg/24hr patch  Commonly known as:  NICODERM CQ - dosed in mg/24 hours  Place 1 patch (21 mg total) onto the skin daily.       No Known Allergies    The results of significant diagnostics from this hospitalization (including imaging, microbiology, ancillary and laboratory) are listed below for reference.    Significant Diagnostic Studies: Dg Chest 2 View  04/01/2015  CLINICAL DATA:  Diaphoresis, shortness of breath, cough, mid sternal chest tightness, vomiting and weakness for 2 weeks. EXAM: CHEST  2 VIEW COMPARISON:  09/01/2005 FINDINGS: Heart is mildly enlarged. There are bibasilar airspace opacities, most confluent at the right lung base. No effusions. No acute bony abnormality.  Mild peribronchial thickening and interstitial prominence. IMPRESSION: Mild bronchitic changes and cardiomegaly. Bibasilar airspace opacities, right greater than left, atelectasis versus pneumonia. Electronically Signed   By: Rolm Baptise M.D.   On: 04/01/2015 15:23    Microbiology: Recent Results (from the past 240 hour(s))  Blood Culture  (routine x 2)     Status: None (Preliminary result)   Collection Time: 04/01/15  2:27 PM  Result Value Ref Range Status   Specimen Description BLOOD RIGHT ANTECUBITAL  Final   Special Requests BOTTLES DRAWN AEROBIC AND ANAEROBIC 5CC  Final   Culture NO GROWTH 1 DAY  Final   Report Status PENDING  Incomplete  Blood Culture (routine x 2)     Status: None (Preliminary result)   Collection Time: 04/01/15  3:47 PM  Result Value Ref Range Status   Specimen Description BLOOD RIGHT ANTECUBITAL  Final   Special Requests BOTTLES DRAWN AEROBIC AND ANAEROBIC 5CSS  Final   Culture NO GROWTH 2 DAYS  Final   Report Status PENDING  Incomplete  Urine culture     Status: None   Collection Time: 04/01/15  9:18 PM  Result Value Ref Range Status   Specimen Description URINE, CLEAN CATCH  Final   Special Requests NONE  Final   Culture MULTIPLE SPECIES PRESENT, SUGGEST RECOLLECTION  Final   Report Status 04/03/2015 FINAL  Final  MRSA PCR Screening     Status: None   Collection Time: 04/03/15  7:23 AM  Result Value Ref Range Status   MRSA by PCR NEGATIVE NEGATIVE Final    Comment:        The GeneXpert MRSA Assay (FDA approved for NASAL specimens only), is one component of a comprehensive MRSA colonization surveillance program. It is not intended to diagnose MRSA infection nor to guide or monitor treatment for MRSA infections.   C difficile quick scan w PCR reflex     Status: None   Collection Time: 04/04/15  5:28 AM  Result Value Ref Range Status   C Diff antigen NEGATIVE NEGATIVE Final   C Diff toxin NEGATIVE NEGATIVE Final   C Diff interpretation Negative for toxigenic C. difficile  Final     Labs: Basic Metabolic Panel:  Recent Labs Lab 04/01/15 1348 04/02/15 0847 04/03/15 0500 04/04/15 0547  NA 138 140 144 147*  K 3.5 3.2* 3.2* 3.4*  CL 106 112* 115* 114*  CO2 17* 19* 20* 23  GLUCOSE 149* 126* 110* 120*  BUN 41* 28* 17 6  CREATININE 1.63* 0.80 0.60 0.55  CALCIUM 9.7 9.0 8.6*  8.8*   Liver Function Tests:  Recent Labs Lab 04/01/15 1348  AST 18  ALT 12*  ALKPHOS 82  BILITOT 0.5  PROT 9.4*  ALBUMIN 3.4*   No results for input(s): LIPASE, AMYLASE in the last 168 hours. No results for input(s): AMMONIA in the last 168 hours. CBC:  Recent Labs Lab 04/01/15 1348 04/01/15 1546 04/02/15 0847 04/03/15 0500 04/04/15 0547  WBC 27.7*  --  16.4* 12.0* 10.9*  NEUTROABS  --  17.6*  --   --   --   HGB 13.7  --  11.6* 10.7* 10.6*  HCT 41.7  --  36.9 33.4* 34.6*  MCV 86.9  --  87.2 87.7 87.4  PLT 356  --  328 340 502*   Cardiac Enzymes: No results for input(s): CKTOTAL, CKMB, CKMBINDEX, TROPONINI in the last 168 hours. BNP: BNP (last 3 results)  Recent Labs  04/01/15 1546  BNP 79.2  ProBNP (last 3 results) No results for input(s): PROBNP in the last 8760 hours.  CBG: No results for input(s): GLUCAP in the last 168 hours.     SignedCristal Ford  [facility] 04/04/2015, 11:43 AM

## 2015-04-06 LAB — CULTURE, BLOOD (ROUTINE X 2): CULTURE: NO GROWTH

## 2015-04-07 LAB — CULTURE, BLOOD (ROUTINE X 2): CULTURE: NO GROWTH

## 2015-04-11 ENCOUNTER — Encounter: Payer: Self-pay | Admitting: Internal Medicine

## 2015-04-11 ENCOUNTER — Ambulatory Visit: Payer: Medicaid Other | Attending: Internal Medicine | Admitting: Internal Medicine

## 2015-04-11 VITALS — BP 181/115 | HR 91 | Temp 97.8°F | Resp 15 | Ht 72.0 in | Wt 229.6 lb

## 2015-04-11 DIAGNOSIS — I160 Hypertensive urgency: Secondary | ICD-10-CM | POA: Diagnosis not present

## 2015-04-11 DIAGNOSIS — Z720 Tobacco use: Secondary | ICD-10-CM

## 2015-04-11 DIAGNOSIS — I252 Old myocardial infarction: Secondary | ICD-10-CM | POA: Diagnosis not present

## 2015-04-11 DIAGNOSIS — F17210 Nicotine dependence, cigarettes, uncomplicated: Secondary | ICD-10-CM | POA: Diagnosis not present

## 2015-04-11 DIAGNOSIS — I10 Essential (primary) hypertension: Secondary | ICD-10-CM | POA: Diagnosis not present

## 2015-04-11 DIAGNOSIS — J189 Pneumonia, unspecified organism: Secondary | ICD-10-CM

## 2015-04-11 DIAGNOSIS — J9601 Acute respiratory failure with hypoxia: Secondary | ICD-10-CM | POA: Diagnosis not present

## 2015-04-11 DIAGNOSIS — Z0000 Encounter for general adult medical examination without abnormal findings: Secondary | ICD-10-CM

## 2015-04-11 DIAGNOSIS — Z79899 Other long term (current) drug therapy: Secondary | ICD-10-CM | POA: Diagnosis not present

## 2015-04-11 DIAGNOSIS — J441 Chronic obstructive pulmonary disease with (acute) exacerbation: Secondary | ICD-10-CM

## 2015-04-11 DIAGNOSIS — IMO0001 Reserved for inherently not codable concepts without codable children: Secondary | ICD-10-CM

## 2015-04-11 DIAGNOSIS — Z7982 Long term (current) use of aspirin: Secondary | ICD-10-CM | POA: Diagnosis not present

## 2015-04-11 DIAGNOSIS — R030 Elevated blood-pressure reading, without diagnosis of hypertension: Secondary | ICD-10-CM

## 2015-04-11 LAB — POCT GLYCOSYLATED HEMOGLOBIN (HGB A1C): Hemoglobin A1C: 5.2

## 2015-04-11 MED ORDER — ASPIRIN 81 MG PO TABS: 81.0000 mg | ORAL_TABLET | Freq: Every day | ORAL | Status: DC

## 2015-04-11 MED ORDER — CLONIDINE HCL 0.2 MG PO TABS
0.2000 mg | ORAL_TABLET | Freq: Once | ORAL | Status: AC
  Administered 2015-04-11: 0.2 mg via ORAL

## 2015-04-11 MED ORDER — LISINOPRIL-HYDROCHLOROTHIAZIDE 10-12.5 MG PO TABS: 1.0000 | ORAL_TABLET | Freq: Every day | ORAL | Status: DC

## 2015-04-11 MED ORDER — METOPROLOL TARTRATE 50 MG PO TABS: 50.0000 mg | ORAL_TABLET | Freq: Two times a day (BID) | ORAL | Status: DC

## 2015-04-11 MED ORDER — ALBUTEROL SULFATE HFA 108 (90 BASE) MCG/ACT IN AERS: 2.0000 | INHALATION_SPRAY | Freq: Four times a day (QID) | RESPIRATORY_TRACT | Status: DC | PRN

## 2015-04-11 MED ORDER — METOPROLOL TARTRATE 12.5 MG HALF TABLET
50.0000 mg | ORAL_TABLET | ORAL | Status: AC
  Administered 2015-04-11: 50 mg via ORAL

## 2015-04-11 MED FILL — LISINOPRIL-HCTZ 10-12.5 MG: 10-12.5 | 30 days supply | Qty: 30 | Fill #0

## 2015-04-11 NOTE — Patient Instructions (Addendum)
- fu w/ Stacey - pharm clinitian for bp chk up, 1 wk - fasting lipid panel next wk as well  - fu w/ me 1-2 months. - cxr end on March at [facility] - ordered - pick up meds at [facility].  Hypertension Hypertension, commonly called high blood pressure, is when the force of blood pumping through your arteries is too strong. Your arteries are the blood vessels that carry blood from your heart throughout your body. A blood pressure reading consists of a higher number over a lower number, such as 110/72. The higher number (systolic) is the pressure inside your arteries when your heart pumps. The lower number (diastolic) is the pressure inside your arteries when your heart relaxes. Ideally you want your blood pressure below 120/80. Hypertension forces your heart to work harder to pump blood. Your arteries may become narrow or stiff. Having untreated or uncontrolled hypertension can cause heart attack, stroke, kidney disease, and other problems. RISK FACTORS Some risk factors for high blood pressure are controllable. Others are not.  Risk factors you cannot control include:   Race. You may be at higher risk if you are African American.  Age. Risk increases with age.  Gender. Men are at higher risk than women before age 50 years. After age 109, women are at higher risk than men. Risk factors you can control include:  Not getting enough exercise or physical activity.  Being overweight.  Getting too much fat, sugar, calories, or salt in your diet.  Drinking too much alcohol. SIGNS AND SYMPTOMS Hypertension does not usually cause signs or symptoms. Extremely high blood pressure (hypertensive crisis) may cause headache, anxiety, shortness of breath, and nosebleed. DIAGNOSIS To check if you have hypertension, your health care provider will measure your blood pressure while you are seated, with your arm held at the level of your heart. It should be measured at least twice using the same arm. Certain  conditions can cause a difference in blood pressure between your right and left arms. A blood pressure reading that is higher than normal on one occasion does not mean that you need treatment. If it is not clear whether you have high blood pressure, you may be asked to return on a different day to have your blood pressure checked again. Or, you may be asked to monitor your blood pressure at home for 1 or more weeks. TREATMENT Treating high blood pressure includes making lifestyle changes and possibly taking medicine. Living a healthy lifestyle can help lower high blood pressure. You may need to change some of your habits. Lifestyle changes may include:  Following the DASH diet. This diet is high in fruits, vegetables, and whole grains. It is low in salt, red meat, and added sugars.  Keep your sodium intake below 2,300 mg per day.  Getting at least 30-45 minutes of aerobic exercise at least 4 times per week.  Losing weight if necessary.  Not smoking.  Limiting alcoholic beverages.  Learning ways to reduce stress. Your health care provider may prescribe medicine if lifestyle changes are not enough to get your blood pressure under control, and if one of the following is true:  You are 60-5 years of age and your systolic blood pressure is above 140.  You are 71 years of age or older, and your systolic blood pressure is above 150.  Your diastolic blood pressure is above 90.  You have diabetes, and your systolic blood pressure is over 140 or your diastolic blood pressure is over  90.  You have kidney disease and your blood pressure is above 140/90.  You have heart disease and your blood pressure is above 140/90. Your personal target blood pressure may vary depending on your medical conditions, your age, and other factors. HOME CARE INSTRUCTIONS  Have your blood pressure rechecked as directed by your health care provider.   Take medicines only as directed by your health care provider.  Follow the directions carefully. Blood pressure medicines must be taken as prescribed. The medicine does not work as well when you skip doses. Skipping doses also puts you at risk for problems.  Do not smoke.   Monitor your blood pressure at home as directed by your health care provider. SEEK MEDICAL CARE IF:   You think you are having a reaction to medicines taken.  You have recurrent headaches or feel dizzy.  You have swelling in your ankles.  You have trouble with your vision. SEEK IMMEDIATE MEDICAL CARE IF:  You develop a severe headache or confusion.  You have unusual weakness, numbness, or feel faint.  You have severe chest or abdominal pain.  You vomit repeatedly.  You have trouble breathing. MAKE SURE YOU:   Understand these instructions.  Will watch your condition.  Will get help right away if you are not doing well or get worse.   This information is not intended to replace advice given to you by your health care provider. Make sure you discuss any questions you have with your health care provider.   Document Released: 01/22/2005 Document Revised: 06/08/2014 Document Reviewed: 11/14/2012 Elsevier Interactive Patient Education [employer].

## 2015-04-11 NOTE — Progress Notes (Signed)
Patient here to establish care after recent admission for PNA She finished abx yesterday and is currently only taking Lisinopril She states she could not afford her inhaler so does not have it

## 2015-04-11 NOTE — Progress Notes (Signed)
Joanna [name], is a 55 y.o. female  ZOX:[id number]  [id number]:[id number]  DOB - [birth year]  CC: fu for hospitalization 2/24-27/2017 for community acquired PNA     HPI: Joanna [name] is a 55 y.o. female here today to establish medical care. Recently hospitalization 2/24-27 for CAP.  Feels better overall. No complaints today. Says her bp labile at times. Stopped many meds in past due to cost. Has quit smoking since 2/23, use to smoke 2ppd x 30 years.  Denies feeling nervous. Taking mucinex, but not robitussin dm.  Patient has No headache, No chest pain, No abdominal pain - No Nausea, No new weakness tingling or numbness, No Cough - SOB.  No Known Allergies Past Medical History  Diagnosis Date  . Hypertension   . MI (myocardial infarction) [facility])    Current Outpatient Prescriptions on File Prior to Visit  Medication Sig Dispense Refill  . albuterol (PROVENTIL HFA;VENTOLIN HFA) 108 (90 Base) MCG/ACT inhaler Inhale 2 puffs into the lungs every 6 (six) hours as needed for wheezing or shortness of breath. 1 Inhaler 2  . Aspirin-Salicylamide-Caffeine (BC FAST PAIN RELIEF) 650-195-33.3 MG PACK Take 1 Package by mouth daily as needed (for headaches).    Marland Kitchen azithromycin (ZITHROMAX) 250 MG tablet Take 1 tablet (250 mg total) by mouth daily. 6 each 0  . cefUROXime (CEFTIN) 500 MG tablet Take 1 tablet (500 mg total) by mouth 2 (two) times daily with a meal. 6 tablet 0  . guaiFENesin (MUCINEX) 600 MG 12 hr tablet Take 1 tablet (600 mg total) by mouth 2 (two) times daily.    Marland Kitchen guaiFENesin-dextromethorphan (ROBITUSSIN DM) 100-10 MG/5ML syrup Take 5 mLs by mouth every 4 (four) hours as needed for cough. 118 mL 0  . lisinopril (PRINIVIL,ZESTRIL) 10 MG tablet Take 1 tablet (10 mg total) by mouth daily. 30 tablet 0  . nicotine (NICODERM CQ - DOSED IN MG/24 HOURS) 21 mg/24hr patch Place 1 patch (21 mg total) onto the skin daily. 28 patch 0   No current facility-administered medications on file prior to visit.     No family history on file. Social History   Social History  . Marital Status: Single    Spouse Name: N/A  . Number of Children: N/A  . Years of Education: N/A   Occupational History  . Not on file.   Social History Main Topics  . Smoking status: Current Every Day Smoker -- 2.00 packs/day  . Smokeless tobacco: Not on file  . Alcohol Use: No     Comment: quit drinking year ago  . Drug Use: No  . Sexual Activity: Not on file   Other Topics Concern  . Not on file   Social History Narrative    Review of Systems: Constitutional: Negative for fever, chills, diaphoresis, activity change, appetite change and fatigue. HENT: Negative for ear pain, nosebleeds, congestion, facial swelling, rhinorrhea, neck pain, neck stiffness and ear discharge.  Eyes: Negative for pain, discharge, redness, itching and visual disturbance. Respiratory: Negative for cough, choking, chest tightness, shortness of breath, wheezing and stridor.  Cardiovascular: Negative for chest pain, palpitations and leg swelling. Gastrointestinal: Negative for abdominal distention. Genitourinary: Negative for dysuria, urgency, frequency, hematuria, flank pain, decreased urine volume, difficulty urinating and dyspareunia.  Musculoskeletal: Negative for back pain, joint swelling, arthralgia and gait problem. Neurological: Negative for dizziness, tremors, seizures, syncope, facial asymmetry, speech difficulty, weakness, light-headedness, numbness and headaches.  Hematological: Negative for adenopathy. Does not bruise/bleed easily. Psychiatric/Behavioral: Negative for hallucinations, behavioral problems, confusion, dysphoric  mood, decreased concentration and agitation.    Objective:  181/115, repeat 172/110 p 91 rr 15 97.8 95% ra  Physical Exam: Constitutional: Patient appears well-developed and well-nourished. No distress. Aa0x3,. Hard of hearing. HENT: Normocephalic, atraumatic, External right and left ear normal.  Oropharynx is clear and moist.  Eyes: Conjunctivae and EOM are normal. PERRL, no scleral icterus. Neck: Normal ROM. Neck supple. No JVD. No tracheal deviation. No thyromegaly. CVS: RRR, S1/S2 +, no murmurs, no gallops, no carotid bruit.  Pulmonary: Effort and breath sounds normal, no stridor, rhonchi, wheezes, rales.  Abdominal: Soft. BS +, no distension, tenderness, rebound or guarding.  Musculoskeletal: Normal range of motion. No edema and no tenderness.  Lymphadenopathy: No lymphadenopathy noted, cervical, inguinal or axillary Neuro: Alert. Normal reflexes, muscle tone coordination. No cranial nerve deficit. Skin: Skin is warm and dry. No rash noted. Not diaphoretic. No erythema. No pallor. Psychiatric: Normal mood and affect. Behavior, judgment, thought content normal.  Lab Results  Component Value Date   WBC 10.9* 04/04/2015   HGB 10.6* 04/04/2015   HCT 34.6* 04/04/2015   MCV 87.4 04/04/2015   PLT 502* 04/04/2015   Lab Results  Component Value Date   CREATININE 0.55 04/04/2015   BUN 6 04/04/2015   NA 147* 04/04/2015   K 3.4* 04/04/2015   CL 114* 04/04/2015   CO2 23 04/04/2015    Lab Results  Component Value Date   HGBA1C 5.9 09/15/2007   Lipid Panel     Component Value Date/Time   CHOL 166 09/15/2007 1147   TRIG 62 09/15/2007 1147   HDL 41.6 09/15/2007 1147   CHOLHDL 4.0 CALC 09/15/2007 1147   VLDL 12 09/15/2007 1147   LDLCALC 112* 09/15/2007 1147    cxr 2/24 bibasilar opacities, r>L,   Echo/ ef 40-45%  06/20/10 echo acute systolic chf, htn, nonischemic cm ef 30-35%   Assessment and plan:   1. Hypertensive urgency, not taking albuterol or robitussin DM recently to account for it.  Suspect due to tobacco cessation/withdrawal. Use to smoke 2ppd x 30 years at least, quit on 2/23.  - repeat bp 181/115 manually per rn.  Given clonidine 0.2 mg x1, metoprolol tartrate 50 x 1 given after rn reck bp, my recheck 172/110.  - recommend aggressive bp control. Was only  taking lisinopril 10 daily.  Was on Coreg 25bid years ago per notes.  - added metoprolol tartrate 50bid,   - changed lisinopril 10qd to lisinopril 10/hctz 12.5 qday.  - asa 81 added daily  -fu for bp chk w/ Misty Stanley /clininal pharm 1 wk.   -have fasting lipid panel done than as well.  2.  Htn, uncontrolled, w/ hx of nonischemic cardiomyopathy (ef 30-35% 06/20/10)  - recent echo repeat 2/17 40-45%  - chk lipid panel asap 3. CAP (community acquired pneumonia) - was discharged w/ azithromycin 250 x 6 days and ceftin - repeat cxr for end of March placed in orders  4. COPD exacerbation (HCC) - appears resolved - represcribed albuterol mdi prn to our pharmacy, $10/each here.  5. Tobacco abuse - per pt, quit since 2/23  6. Acute respiratory failure with hypoxia (HCC) - resolved, albuterol prn prescribed.  7. Health care maintenance - HgB A1c 5.2 - fasting lipid panel next week.  ldh 112 (2009), chk fasting next week   Fu w/ me 1-2 months.  The patient was given clear instructions to go to ER or return to medical center if symptoms don't improve, worsen or new problems develop. The  patient verbalized understanding. The patient was told to call to get lab results if they haven't heard anything in the next week.       Pete Glatter, MD, MBA/MHA [facility] [place], [place] [phone]   04/11/2015, 10:45 AM

## 2015-04-18 ENCOUNTER — Ambulatory Visit: Payer: Self-pay | Admitting: Pharmacist

## 2015-04-26 ENCOUNTER — Encounter: Payer: Self-pay | Admitting: Pharmacist

## 2015-07-14 MED FILL — LISINOPRIL-HCTZ 10-12.5 MG: 10-12.5 | 30 days supply | Qty: 30 | Fill #1

## 2018-02-07 DIAGNOSIS — R0602 Shortness of breath: Secondary | ICD-10-CM | POA: Diagnosis not present

## 2018-02-07 DIAGNOSIS — R05 Cough: Secondary | ICD-10-CM | POA: Diagnosis not present

## 2018-02-07 DIAGNOSIS — J9601 Acute respiratory failure with hypoxia: Secondary | ICD-10-CM | POA: Diagnosis not present

## 2018-02-07 DIAGNOSIS — R000 Tachycardia, unspecified: Secondary | ICD-10-CM | POA: Diagnosis not present

## 2018-02-08 ENCOUNTER — Inpatient Hospital Stay (HOSPITAL_COMMUNITY)
Admission: EM | Admit: 2018-02-08 | Discharge: 2018-02-10 | DRG: 291 | Disposition: A | Discharge status: Home / Self Care | Payer: Medicare Other | Attending: Family Medicine | Admitting: Family Medicine

## 2018-02-08 ENCOUNTER — Emergency Department (HOSPITAL_COMMUNITY): Payer: Medicare Other

## 2018-02-08 ENCOUNTER — Encounter (HOSPITAL_COMMUNITY): Payer: Self-pay | Admitting: Emergency Medicine

## 2018-02-08 DIAGNOSIS — F17200 Nicotine dependence, unspecified, uncomplicated: Secondary | ICD-10-CM | POA: Diagnosis present

## 2018-02-08 DIAGNOSIS — I252 Old myocardial infarction: Secondary | ICD-10-CM | POA: Diagnosis not present

## 2018-02-08 DIAGNOSIS — J9602 Acute respiratory failure with hypercapnia: Secondary | ICD-10-CM

## 2018-02-08 DIAGNOSIS — I428 Other cardiomyopathies: Secondary | ICD-10-CM | POA: Diagnosis present

## 2018-02-08 DIAGNOSIS — R402363 Coma scale, best motor response, obeys commands, at hospital admission: Secondary | ICD-10-CM | POA: Diagnosis present

## 2018-02-08 DIAGNOSIS — Z8679 Personal history of other diseases of the circulatory system: Secondary | ICD-10-CM | POA: Diagnosis not present

## 2018-02-08 DIAGNOSIS — R402253 Coma scale, best verbal response, oriented, at hospital admission: Secondary | ICD-10-CM | POA: Diagnosis present

## 2018-02-08 DIAGNOSIS — R402143 Coma scale, eyes open, spontaneous, at hospital admission: Secondary | ICD-10-CM | POA: Diagnosis present

## 2018-02-08 DIAGNOSIS — Z23 Encounter for immunization: Secondary | ICD-10-CM

## 2018-02-08 DIAGNOSIS — Z7982 Long term (current) use of aspirin: Secondary | ICD-10-CM | POA: Diagnosis not present

## 2018-02-08 DIAGNOSIS — I110 Hypertensive heart disease with heart failure: Secondary | ICD-10-CM | POA: Diagnosis present

## 2018-02-08 DIAGNOSIS — D72829 Elevated white blood cell count, unspecified: Secondary | ICD-10-CM | POA: Diagnosis present

## 2018-02-08 DIAGNOSIS — I160 Hypertensive urgency: Secondary | ICD-10-CM | POA: Diagnosis not present

## 2018-02-08 DIAGNOSIS — Z91018 Allergy to other foods: Secondary | ICD-10-CM | POA: Diagnosis not present

## 2018-02-08 DIAGNOSIS — J9601 Acute respiratory failure with hypoxia: Secondary | ICD-10-CM | POA: Diagnosis not present

## 2018-02-08 DIAGNOSIS — E876 Hypokalemia: Secondary | ICD-10-CM | POA: Diagnosis present

## 2018-02-08 DIAGNOSIS — I161 Hypertensive emergency: Secondary | ICD-10-CM | POA: Diagnosis present

## 2018-02-08 DIAGNOSIS — Z79899 Other long term (current) drug therapy: Secondary | ICD-10-CM | POA: Diagnosis not present

## 2018-02-08 DIAGNOSIS — R0602 Shortness of breath: Secondary | ICD-10-CM | POA: Diagnosis not present

## 2018-02-08 DIAGNOSIS — R918 Other nonspecific abnormal finding of lung field: Secondary | ICD-10-CM | POA: Diagnosis not present

## 2018-02-08 DIAGNOSIS — R0689 Other abnormalities of breathing: Secondary | ICD-10-CM | POA: Diagnosis not present

## 2018-02-08 DIAGNOSIS — I5023 Acute on chronic systolic (congestive) heart failure: Secondary | ICD-10-CM | POA: Diagnosis present

## 2018-02-08 DIAGNOSIS — R0902 Hypoxemia: Secondary | ICD-10-CM | POA: Diagnosis not present

## 2018-02-08 DIAGNOSIS — J441 Chronic obstructive pulmonary disease with (acute) exacerbation: Secondary | ICD-10-CM | POA: Diagnosis present

## 2018-02-08 DIAGNOSIS — J810 Acute pulmonary edema: Secondary | ICD-10-CM | POA: Diagnosis not present

## 2018-02-08 DIAGNOSIS — I5021 Acute systolic (congestive) heart failure: Secondary | ICD-10-CM | POA: Diagnosis not present

## 2018-02-08 DIAGNOSIS — J80 Acute respiratory distress syndrome: Secondary | ICD-10-CM | POA: Diagnosis not present

## 2018-02-08 DIAGNOSIS — R05 Cough: Secondary | ICD-10-CM | POA: Diagnosis not present

## 2018-02-08 DIAGNOSIS — R000 Tachycardia, unspecified: Secondary | ICD-10-CM | POA: Diagnosis not present

## 2018-02-08 DIAGNOSIS — Z4682 Encounter for fitting and adjustment of non-vascular catheter: Secondary | ICD-10-CM | POA: Diagnosis not present

## 2018-02-08 LAB — CBC WITH DIFFERENTIAL/PLATELET
Abs Immature Granulocytes: 0.06 10*3/uL (ref 0.00–0.07)
BASOS PCT: 1 %
Basophils Absolute: 0.1 10*3/uL (ref 0.0–0.1)
EOS PCT: 4 %
Eosinophils Absolute: 0.5 10*3/uL (ref 0.0–0.5)
HCT: 46.6 % — ABNORMAL HIGH (ref 36.0–46.0)
HEMOGLOBIN: 13 g/dL (ref 12.0–15.0)
Immature Granulocytes: 0 %
LYMPHS ABS: 7.5 10*3/uL — AB (ref 0.7–4.0)
Lymphocytes Relative: 55 %
MCH: 26.6 pg (ref 26.0–34.0)
MCHC: 27.9 g/dL — AB (ref 30.0–36.0)
MCV: 95.5 fL (ref 80.0–100.0)
Monocytes Absolute: 0.7 10*3/uL (ref 0.1–1.0)
Monocytes Relative: 5 %
NEUTROS PCT: 35 %
Neutro Abs: 4.8 10*3/uL (ref 1.7–7.7)
PLATELETS: 282 10*3/uL (ref 150–400)
RBC: 4.88 MIL/uL (ref 3.87–5.11)
RDW: 14.3 % (ref 11.5–15.5)
WBC: 13.6 10*3/uL — ABNORMAL HIGH (ref 4.0–10.5)
nRBC: 0 % (ref 0.0–0.2)

## 2018-02-08 LAB — I-STAT CHEM 8, ED
BUN: 23 mg/dL — ABNORMAL HIGH (ref 6–20)
Calcium, Ion: 1.09 mmol/L — ABNORMAL LOW (ref 1.15–1.40)
Chloride: 110 mmol/L (ref 98–111)
Creatinine, Ser: 0.7 mg/dL (ref 0.44–1.00)
GLUCOSE: 235 mg/dL — AB (ref 70–99)
HCT: 47 % — ABNORMAL HIGH (ref 36.0–46.0)
Hemoglobin: 16 g/dL — ABNORMAL HIGH (ref 12.0–15.0)
Potassium: 3.6 mmol/L (ref 3.5–5.1)
Sodium: 145 mmol/L (ref 135–145)
TCO2: 25 mmol/L (ref 22–32)

## 2018-02-08 LAB — COMPREHENSIVE METABOLIC PANEL
ALK PHOS: 93 U/L (ref 38–126)
ALT: 82 U/L — ABNORMAL HIGH (ref 0–44)
AST: 139 U/L — AB (ref 15–41)
Albumin: 3.6 g/dL (ref 3.5–5.0)
Anion gap: 12 (ref 5–15)
BUN: 18 mg/dL (ref 6–20)
CO2: 20 mmol/L — ABNORMAL LOW (ref 22–32)
Calcium: 8.8 mg/dL — ABNORMAL LOW (ref 8.9–10.3)
Chloride: 110 mmol/L (ref 98–111)
Creatinine, Ser: 0.84 mg/dL (ref 0.44–1.00)
Glucose, Bld: 239 mg/dL — ABNORMAL HIGH (ref 70–99)
Potassium: 3.6 mmol/L (ref 3.5–5.1)
SODIUM: 142 mmol/L (ref 135–145)
Total Bilirubin: 0.9 mg/dL (ref 0.3–1.2)
Total Protein: 8.3 g/dL — ABNORMAL HIGH (ref 6.5–8.1)

## 2018-02-08 LAB — I-STAT CG4 LACTIC ACID, ED
LACTIC ACID, VENOUS: 4.9 mmol/L — AB (ref 0.5–1.9)
Lactic Acid, Venous: 2.39 mmol/L (ref 0.5–1.9)

## 2018-02-08 LAB — RAPID URINE DRUG SCREEN, HOSP PERFORMED
Amphetamines: NOT DETECTED
Barbiturates: NOT DETECTED
Benzodiazepines: NOT DETECTED
Cocaine: NOT DETECTED
Opiates: NOT DETECTED
TETRAHYDROCANNABINOL: NOT DETECTED

## 2018-02-08 LAB — BASIC METABOLIC PANEL
Anion gap: 11 (ref 5–15)
BUN: 16 mg/dL (ref 6–20)
CO2: 21 mmol/L — ABNORMAL LOW (ref 22–32)
Calcium: 9 mg/dL (ref 8.9–10.3)
Chloride: 110 mmol/L (ref 98–111)
Creatinine, Ser: 0.56 mg/dL (ref 0.44–1.00)
GFR calc non Af Amer: >60 mL/min (ref >60)
GLUCOSE: 135 mg/dL — AB (ref 70–99)
Potassium: 3.5 mmol/L (ref 3.5–5.1)
Sodium: 142 mmol/L (ref 135–145)

## 2018-02-08 LAB — RESPIRATORY PANEL BY PCR
Adenovirus: NOT DETECTED
Bordetella pertussis: NOT DETECTED
CORONAVIRUS OC43-RVPPCR: NOT DETECTED
Chlamydophila pneumoniae: NOT DETECTED
Coronavirus 229E: NOT DETECTED
Coronavirus HKU1: NOT DETECTED
Coronavirus NL63: NOT DETECTED
INFLUENZA A-RVPPCR: NOT DETECTED
Influenza B: NOT DETECTED
Metapneumovirus: NOT DETECTED
Mycoplasma pneumoniae: NOT DETECTED
Parainfluenza Virus 1: NOT DETECTED
Parainfluenza Virus 2: NOT DETECTED
Parainfluenza Virus 3: NOT DETECTED
Parainfluenza Virus 4: NOT DETECTED
Respiratory Syncytial Virus: NOT DETECTED
Rhinovirus / Enterovirus: NOT DETECTED

## 2018-02-08 LAB — I-STAT ARTERIAL BLOOD GAS, ED
ACID-BASE DEFICIT: 4 mmol/L — AB (ref 0.0–2.0)
BICARBONATE: 25.1 mmol/L (ref 20.0–28.0)
O2 Saturation: 99 %
PCO2 ART: 66 mmHg — AB (ref 32.0–48.0)
PH ART: 7.187 — AB (ref 7.350–7.450)
TCO2: 27 mmol/L (ref 22–32)
pO2, Arterial: 185 mmHg — ABNORMAL HIGH (ref 83.0–108.0)

## 2018-02-08 LAB — TRIGLYCERIDES: Triglycerides: 88 mg/dL (ref <150)

## 2018-02-08 LAB — TROPONIN I: Troponin I: 0.17 ng/mL (ref <0.03)

## 2018-02-08 LAB — I-STAT TROPONIN, ED: Troponin i, poc: 0.05 ng/mL (ref 0.00–0.08)

## 2018-02-08 LAB — INFLUENZA PANEL BY PCR (TYPE A & B)
Influenza A By PCR: NEGATIVE
Influenza B By PCR: NEGATIVE

## 2018-02-08 LAB — MRSA PCR SCREENING: MRSA by PCR: POSITIVE — AB

## 2018-02-08 LAB — HIV ANTIBODY (ROUTINE TESTING W REFLEX): HIV Screen 4th Generation wRfx: NONREACTIVE

## 2018-02-08 LAB — BRAIN NATRIURETIC PEPTIDE: B Natriuretic Peptide: 532.9 pg/mL — ABNORMAL HIGH (ref 0.0–100.0)

## 2018-02-08 MED ORDER — PROPOFOL 1000 MG/100ML IV EMUL
INTRAVENOUS | Status: AC
  Filled 2018-02-08: qty 100

## 2018-02-08 MED ORDER — FENTANYL CITRATE (PF) 100 MCG/2ML IJ SOLN
INTRAMUSCULAR | Status: AC
  Filled 2018-02-08: qty 2

## 2018-02-08 MED ORDER — IPRATROPIUM-ALBUTEROL 0.5-2.5 (3) MG/3ML IN SOLN
3.0000 mL | Freq: Four times a day (QID) | RESPIRATORY_TRACT | Status: DC
  Administered 2018-02-08 (×3): 3 mL via RESPIRATORY_TRACT
  Filled 2018-02-08 (×2): qty 3

## 2018-02-08 MED ORDER — FENTANYL CITRATE (PF) 100 MCG/2ML IJ SOLN: 100.0000 ug | INTRAMUSCULAR | Status: DC | PRN

## 2018-02-08 MED ORDER — NITROGLYCERIN IN D5W 200-5 MCG/ML-% IV SOLN
INTRAVENOUS | Status: AC
  Administered 2018-02-08: 5 ug/min
  Filled 2018-02-08: qty 250

## 2018-02-08 MED ORDER — ENOXAPARIN SODIUM 40 MG/0.4ML ~~LOC~~ SOLN
40.0000 mg | Freq: Every day | SUBCUTANEOUS | Status: DC
  Administered 2018-02-08 – 2018-02-10 (×3): 40 mg via SUBCUTANEOUS
  Filled 2018-02-08 (×4): qty 0.4

## 2018-02-08 MED ORDER — CHLORHEXIDINE GLUCONATE 0.12% ORAL RINSE (MEDLINE KIT): 15.0000 mL | Freq: Two times a day (BID) | OROMUCOSAL | Status: DC

## 2018-02-08 MED ORDER — ETOMIDATE 2 MG/ML IV SOLN
INTRAVENOUS | Status: AC | PRN
  Administered 2018-02-08: 20 mg via INTRAVENOUS

## 2018-02-08 MED ORDER — FAMOTIDINE 20 MG PO TABS
20.0000 mg | ORAL_TABLET | Freq: Two times a day (BID) | ORAL | Status: DC
  Administered 2018-02-08 – 2018-02-10 (×4): 20 mg via ORAL
  Filled 2018-02-08 (×4): qty 1

## 2018-02-08 MED ORDER — PROPOFOL 1000 MG/100ML IV EMUL
0.0000 ug/kg/min | INTRAVENOUS | Status: DC
  Administered 2018-02-08: 20 ug/kg/min via INTRAVENOUS

## 2018-02-08 MED ORDER — ORAL CARE MOUTH RINSE
15.0000 mL | OROMUCOSAL | Status: DC
  Administered 2018-02-08: 15 mL via OROMUCOSAL

## 2018-02-08 MED ORDER — FENTANYL CITRATE (PF) 100 MCG/2ML IJ SOLN
100.0000 ug | INTRAMUSCULAR | Status: DC | PRN
  Administered 2018-02-08: 100 ug via INTRAVENOUS
  Filled 2018-02-08: qty 2

## 2018-02-08 MED ORDER — LISINOPRIL 10 MG PO TABS
10.0000 mg | ORAL_TABLET | Freq: Every day | ORAL | Status: DC
  Administered 2018-02-08 – 2018-02-09 (×2): 10 mg via ORAL
  Filled 2018-02-08 (×2): qty 1

## 2018-02-08 MED ORDER — FAMOTIDINE IN NACL 20-0.9 MG/50ML-% IV SOLN
20.0000 mg | Freq: Two times a day (BID) | INTRAVENOUS | Status: DC
  Administered 2018-02-08: 20 mg via INTRAVENOUS
  Filled 2018-02-08: qty 50

## 2018-02-08 MED ORDER — SODIUM CHLORIDE 0.9 % IV SOLN
500.0000 mg | Freq: Once | INTRAVENOUS | Status: AC
  Administered 2018-02-08: 500 mg via INTRAVENOUS
  Filled 2018-02-08: qty 500

## 2018-02-08 MED ORDER — FENTANYL 2500MCG IN NS 250ML (10MCG/ML) PREMIX INFUSION
0.0000 ug/h | INTRAVENOUS | Status: DC
  Administered 2018-02-08: 20 ug/h via INTRAVENOUS
  Filled 2018-02-08: qty 250

## 2018-02-08 MED ORDER — SODIUM CHLORIDE 0.9 % IV SOLN
INTRAVENOUS | Status: DC | PRN
  Administered 2018-02-09: 250 mL via INTRAVENOUS

## 2018-02-08 MED ORDER — ROCURONIUM BROMIDE 50 MG/5ML IV SOLN
INTRAVENOUS | Status: AC | PRN
  Administered 2018-02-08: 80 mg via INTRAVENOUS

## 2018-02-08 MED ORDER — METOPROLOL TARTRATE 25 MG/10 ML ORAL SUSPENSION: 50.0000 mg | Freq: Two times a day (BID) | ORAL | Status: DC

## 2018-02-08 MED ORDER — PROPOFOL 1000 MG/100ML IV EMUL
0.0000 ug/kg/min | INTRAVENOUS | Status: DC
  Filled 2018-02-08: qty 100

## 2018-02-08 MED ORDER — FUROSEMIDE 10 MG/ML IJ SOLN
40.0000 mg | Freq: Two times a day (BID) | INTRAMUSCULAR | Status: DC
  Administered 2018-02-08 – 2018-02-09 (×3): 40 mg via INTRAVENOUS
  Filled 2018-02-08 (×3): qty 4

## 2018-02-08 MED ORDER — METOPROLOL TARTRATE 50 MG PO TABS
50.0000 mg | ORAL_TABLET | Freq: Two times a day (BID) | ORAL | Status: DC
  Administered 2018-02-08 – 2018-02-10 (×4): 50 mg via ORAL
  Filled 2018-02-08 (×4): qty 1

## 2018-02-08 MED ORDER — SODIUM CHLORIDE 0.9 % IV SOLN
2.0000 g | Freq: Once | INTRAVENOUS | Status: AC
  Administered 2018-02-08: 2 g via INTRAVENOUS
  Filled 2018-02-08: qty 20

## 2018-02-08 MED ORDER — PROPOFOL 1000 MG/100ML IV EMUL: 0.0000 ug/kg/min | INTRAVENOUS | Status: DC

## 2018-02-08 MED ORDER — FENTANYL CITRATE (PF) 100 MCG/2ML IJ SOLN
100.0000 ug | INTRAMUSCULAR | Status: AC | PRN
  Administered 2018-02-08 (×3): 100 ug via INTRAVENOUS

## 2018-02-08 MED ORDER — HYDRALAZINE HCL 20 MG/ML IJ SOLN
10.0000 mg | INTRAMUSCULAR | Status: DC | PRN
  Administered 2018-02-08 – 2018-02-09 (×2): 10 mg via INTRAVENOUS
  Filled 2018-02-08 (×2): qty 1

## 2018-02-08 MED ORDER — FUROSEMIDE 10 MG/ML IJ SOLN
40.0000 mg | Freq: Once | INTRAMUSCULAR | Status: AC
  Administered 2018-02-08: 40 mg via INTRAVENOUS
  Filled 2018-02-08: qty 4

## 2018-02-08 MED ORDER — ORAL CARE MOUTH RINSE: 15.0000 mL | Freq: Two times a day (BID) | OROMUCOSAL | Status: DC

## 2018-02-08 MED ORDER — FENTANYL CITRATE (PF) 100 MCG/2ML IJ SOLN
INTRAMUSCULAR | Status: AC
  Administered 2018-02-08: 100 ug via INTRAVENOUS
  Filled 2018-02-08: qty 2

## 2018-02-08 NOTE — ED Triage Notes (Signed)
Pt transported from home by EMS, pts husband called for shob, frothy emesis, pt tripoding on stretcher, pt placed on cpap by EMS, Mag 2gm, solumedrol 125mg  given. Nitro x 2 SL #20 L AC.

## 2018-02-08 NOTE — Procedures (Signed)
Extubation Procedure Note  Patient Details:   Name: Joanna [name] DOB: 08/25/1960 MRN: [id number]   Airway Documentation:    Vent end date: 02/08/18 Vent end time: 1652   Evaluation  O2 sats: stable throughout Complications: No apparent complications Patient did tolerate procedure well. Bilateral Breath Sounds: Rhonchi, Diminished   Yes: patient was able to breathe around the cuff prior to extubation and speak post extubation.   Nell Range 02/08/2018, 4:52 PM

## 2018-02-08 NOTE — Progress Notes (Signed)
eLink Physician-Brief Progress Note Patient Name: Jacquiline Purinton DOB: September 02, 1960 MRN: [id number]   Date of Service  02/08/2018  HPI/Events of Note  Hypertension - BP 180/110. Creatinine = 0.81 and K+ = 3.6.   eICU Interventions  Will order: 1. Lisinopril 10 mg PO now and Q day.      Intervention Category Major Interventions: Hypertension - evaluation and management  Sohana Austell Eugene 02/08/2018, 10:50 PM

## 2018-02-08 NOTE — Progress Notes (Signed)
eLink Physician-Brief Progress Note Patient Name: Joanna [name] DOB: 06-02-1960 MRN: [id number]   Date of Service  02/08/2018  HPI/Events of Note  Hypertension - BP = 145/107 with MAP = 118.   eICU Interventions  Will order: 1. Hydralazine 10 mg IV Q 4 hours PRN SBP > 170 or DBP > 100.      Intervention Category Major Interventions: Hypertension - evaluation and management  Sommer,Steven Eugene 02/08/2018, 8:54 PM

## 2018-02-08 NOTE — ED Provider Notes (Signed)
[place] EMERGENCY DEPARTMENT Provider Note   CSN: [id number] Arrival date & time: 02/08/18  0453     History   Chief Complaint Chief Complaint  Patient presents with  . Respiratory Distress    HPI Joanna [name] is a 58 y.o. female.  HPI 58 year old female with history of MI, hypertension, COPD, reported CHF, here with severe shortness of breath.  History limited on arrival due to extreme dyspnea.  Patient reportedly called EMS due to shortness of breath.  On EMS arrival, the patient was markedly hypertensive and dyspneic.  She was diaphoretic.  Denied chest pain.  She was placed on CPAP and reportedly was satting in the low 70s on their arrival.  Since then, she has had persistently increased work of breathing and seems to be worsening.  She did vomit frothy material just prior to arrival in the ED.  Remainder of history limited due to patient being in extremis on arrival.  Level 5 caveat invoked as remainder of history, ROS, and physical exam limited due to patient's dyspnea.   Past Medical History:  Diagnosis Date  . Hypertension   . MI (myocardial infarction) [facility])     Patient Active Problem List   Diagnosis Date Noted  . Hypertensive urgency 04/11/2015  . Sepsis ([place]) 04/01/2015  . CAP (community acquired pneumonia) 04/01/2015  . Dehydration 04/01/2015  . AKI (acute kidney injury) ([place]) 04/01/2015  . Acute respiratory failure with hypoxia ([place]) 04/01/2015  . Tobacco abuse 04/01/2015  . Nausea vomiting and diarrhea 04/01/2015  . COPD exacerbation ([place]) 04/01/2015    Past Surgical History:  Procedure Laterality Date  . APPENDECTOMY    . BACK SURGERY       OB History   No obstetric history on file.      Home Medications    Prior to Admission medications   Medication Sig Start Date End Date Taking? Authorizing Provider  albuterol (PROVENTIL HFA;VENTOLIN HFA) 108 (90 Base) MCG/ACT inhaler Inhale 2 puffs into the lungs every 6 (six)  hours as needed for wheezing or shortness of breath. Patient not taking: Reported on 04/11/2015 04/11/15   Maren Reamer, MD  aspirin 81 MG tablet Take 1 tablet (81 mg total) by mouth daily. 04/11/15   Maren Reamer, MD  Aspirin-Salicylamide-Caffeine (BC FAST PAIN RELIEF) [phone] MG PACK Take 1 Package by mouth daily as needed (for headaches). Reported on 04/11/2015    [provider]  guaiFENesin (MUCINEX) 600 MG 12 hr tablet Take 1 tablet (600 mg total) by mouth 2 (two) times daily. Patient not taking: Reported on 04/11/2015 04/04/15   Cristal Ford, DO  guaiFENesin-dextromethorphan (ROBITUSSIN DM) 100-10 MG/5ML syrup Take 5 mLs by mouth every 4 (four) hours as needed for cough. Patient not taking: Reported on 04/11/2015 04/04/15   Cristal Ford, DO  lisinopril-hydrochlorothiazide (PRINZIDE,ZESTORETIC) 10-12.5 MG tablet Take 1 tablet by mouth daily. 04/11/15   Maren Reamer, MD  metoprolol (LOPRESSOR) 50 MG tablet Take 1 tablet (50 mg total) by mouth 2 (two) times daily. 04/11/15   Langeland, Dawn T, MD  nicotine (NICODERM CQ - DOSED IN MG/24 HOURS) 21 mg/24hr patch Place 1 patch (21 mg total) onto the skin daily. Patient not taking: Reported on 04/11/2015 04/04/15   Cristal Ford, DO    Family History No family history on file.  Social History Social History   Tobacco Use  . Smoking status: Former Smoker    Packs/day: 2.00    Last attempt to quit: 03/31/2015  Years since quitting: 2.8  . Smokeless tobacco: Never Used  Substance Use Topics  . Alcohol use: No    Comment: quit drinking year ago  . Drug use: No     Allergies   Patient has no known allergies.   Review of Systems Review of Systems  Unable to perform ROS: Acuity of condition  Constitutional: Positive for fatigue.  Respiratory: Positive for cough and shortness of breath.      Physical Exam Updated Vital Signs BP 112/68   Pulse 72   Temp (!) 97.2 F (36.2 C) (Temporal)   Resp (!) 22   Ht  5' 11"  (1.803 m)   Wt 100 kg   SpO2 99%   BMI 30.75 kg/m   Physical Exam Vitals signs and nursing note reviewed.  Constitutional:      General: She is not in acute distress.    Appearance: She is well-developed. She is ill-appearing and diaphoretic.  HENT:     Head: Normocephalic and atraumatic.  Eyes:     Conjunctiva/sclera: Conjunctivae normal.  Neck:     Musculoskeletal: Neck supple.  Cardiovascular:     Rate and Rhythm: Regular rhythm. Tachycardia present.     Heart sounds: Normal heart sounds. No murmur. No friction rub.  Pulmonary:     Effort: Tachypnea and respiratory distress present.     Breath sounds: Decreased air movement present. Examination of the right-upper field reveals rales. Examination of the left-upper field reveals rales. Examination of the right-middle field reveals rales. Examination of the left-middle field reveals rales. Examination of the right-lower field reveals rales. Examination of the left-lower field reveals rales. Wheezing and rales present.  Abdominal:     General: There is no distension.     Palpations: Abdomen is soft.     Tenderness: There is no abdominal tenderness.  Skin:    General: Skin is warm.     Capillary Refill: Capillary refill takes less than 2 seconds.  Neurological:     Mental Status: She is alert and oriented to person, place, and time.     Motor: No abnormal muscle tone.      ED Treatments / Results  Labs (all labs ordered are listed, but only abnormal results are displayed) Labs Reviewed  COMPREHENSIVE METABOLIC PANEL - Abnormal; Notable for the following components:      Result Value   CO2 20 (*)    Glucose, Bld 239 (*)    Calcium 8.8 (*)    Total Protein 8.3 (*)    AST 139 (*)    ALT 82 (*)    All other components within normal limits  CBC WITH DIFFERENTIAL/PLATELET - Abnormal; Notable for the following components:   WBC 13.6 (*)    HCT 46.6 (*)    MCHC 27.9 (*)    Lymphs Abs 7.5 (*)    All other components  within normal limits  BRAIN NATRIURETIC PEPTIDE - Abnormal; Notable for the following components:   B Natriuretic Peptide 532.9 (*)    All other components within normal limits  TROPONIN I - Abnormal; Notable for the following components:   Troponin I 0.17 (*)    All other components within normal limits  I-STAT ARTERIAL BLOOD GAS, ED - Abnormal; Notable for the following components:   pH, Arterial 7.187 (*)    pCO2 arterial 66.0 (*)    pO2, Arterial 185.0 (*)    Acid-base deficit 4.0 (*)    All other components within normal limits  I-STAT CG4  LACTIC ACID, ED - Abnormal; Notable for the following components:   Lactic Acid, Venous 4.90 (*)    All other components within normal limits  I-STAT CHEM 8, ED - Abnormal; Notable for the following components:   BUN 23 (*)    Glucose, Bld 235 (*)    Calcium, Ion 1.09 (*)    Hemoglobin 16.0 (*)    HCT 47.0 (*)    All other components within normal limits  CULTURE, BLOOD (ROUTINE X 2)  CULTURE, BLOOD (ROUTINE X 2)  TRIGLYCERIDES  RAPID URINE DRUG SCREEN, HOSP PERFORMED  INFLUENZA PANEL BY PCR (TYPE A & B)  I-STAT TROPONIN, ED  I-STAT CG4 LACTIC ACID, ED    EKG EKG Interpretation  Date/Time:  Saturday February 08 2018 05:01:52 EST Ventricular Rate:  111 PR Interval:    QRS Duration: 106 QT Interval:  337 QTC Calculation: 458 R Axis:   64 Text Interpretation:  Sinus tachycardia Left atrial enlargement LVH with secondary repolarization abnormality Since last EKG, rate is increased LVh is more evident Confirmed by Duffy Bruce [phone]) on 02/08/2018 6:22:27 AM   Radiology Dg Chest Port 1 View  Result Date: 02/08/2018 CLINICAL DATA:  Endotracheal tube placement. Respiratory distress. EXAM: PORTABLE CHEST 1 VIEW COMPARISON:  Chest radiograph performed 04/01/2015 FINDINGS: The patient's endotracheal tube is seen ending 6 cm above the carina. An enteric tube is noted extending below the diaphragm. Vascular congestion is noted. Bilateral  midlung airspace opacification may reflect pneumonia or pulmonary edema. No significant pleural effusion or pneumothorax is seen. The cardiomediastinal silhouette is mildly enlarged. No acute osseous abnormalities are identified. IMPRESSION: 1. Endotracheal tube seen ending 6 cm above the carina. 2. Vascular congestion and mild cardiomegaly. Bilateral midlung airspace opacification may reflect pneumonia or pulmonary edema. Electronically Signed   By: Garald Balding M.D.   On: 02/08/2018 05:23    Procedures .Critical Care Performed by: Duffy Bruce, MD Authorized by: Duffy Bruce, MD   Critical care provider statement:    Critical care time (minutes):  35   Critical care time was exclusive of:  Separately billable procedures and treating other patients and teaching time   Critical care was necessary to treat or prevent imminent or life-threatening deterioration of the following conditions:  Cardiac failure, circulatory failure and respiratory failure   Critical care was time spent personally by me on the following activities:  Development of treatment plan with patient or surrogate, discussions with consultants, evaluation of patient's response to treatment, examination of patient, obtaining history from patient or surrogate, ordering and performing treatments and interventions, ordering and review of laboratory studies, ordering and review of radiographic studies, pulse oximetry, re-evaluation of patient's condition and review of old charts   I assumed direction of critical care for this patient from another provider in my specialty: no   Procedure Name: Intubation Date/Time: 02/08/2018 6:37 AM Performed by: Duffy Bruce, MD Pre-anesthesia Checklist: Patient identified, Patient being monitored, Emergency Drugs available, Timeout performed and Suction available Oxygen Delivery Method: Non-rebreather mask Preoxygenation: Pre-oxygenation with 100% oxygen Induction Type: Rapid  sequence Ventilation: Mask ventilation without difficulty Laryngoscope Size: Mac and 3 Grade View: Grade I Tube size: 7.5 mm Number of attempts: 1 Airway Equipment and Method: Rigid stylet and Video-laryngoscopy Placement Confirmation: ETT inserted through vocal cords under direct vision,  CO2 detector and Breath sounds checked- equal and bilateral Secured at: 24 cm Tube secured with: ETT holder Dental Injury: Teeth and Oropharynx as per pre-operative assessment  Difficulty Due To: Difficulty was  unanticipated Future Recommendations: Recommend- induction with short-acting agent, and alternative techniques readily available      (including critical care time)  Medications Ordered in ED Medications  fentaNYL (SUBLIMAZE) injection 100 mcg (has no administration in time range)  fentaNYL (SUBLIMAZE) injection 100 mcg (100 mcg Intravenous Given 02/08/18 0545)  cefTRIAXone (ROCEPHIN) 2 g in sodium chloride 0.9 % 100 mL IVPB (has no administration in time range)  azithromycin (ZITHROMAX) 500 mg in sodium chloride 0.9 % 250 mL IVPB (has no administration in time range)  propofol (DIPRIVAN) 1000 MG/100ML infusion (has no administration in time range)  nitroGLYCERIN 0.2 mg/mL in dextrose 5 % infusion (0 mcg/kg/min  Paused 02/08/18 0608)  furosemide (LASIX) injection 40 mg (40 mg Intravenous Given 02/08/18 0545)  etomidate (AMIDATE) injection (20 mg Intravenous Given 02/08/18 0456)  rocuronium (ZEMURON) injection (80 mg Intravenous Given 02/08/18 0458)     Initial Impression / Assessment and Plan / ED Course  I have reviewed the triage vital signs and the nursing notes.  Pertinent labs & imaging results that were available during my care of the patient were reviewed by me and considered in my medical decision making (see chart for details).     58 year old female here with acute respiratory distress, which clinically I suspect is due to CHF versus hypertensive emergency.  Patient trialed very  briefly on CPAP on arrival, and due to vomiting of frothy sputum which I suspect is pulmonary edema, decision made to intubate.  Patient preoxygenated on 100% FiO2 via CPAP, then intubated as above uneventfully.  Patient tolerated well.  Lasix given and propofol given, with subsequent improvement in her blood pressure and oxygenation.  Chest x-ray does show possible pneumonia and she has a lactic acidosis.  I suspect this is due to her increased work of breathing, will cover empirically with broad-spectrum community-acquired pneumonia antibiotics.  She also has some mild hypercapnia, for which her rate has been adjusted, and breathing treatments given.  Family updated and aware.  EKG shows no signs of ST elevation MI.  Admit to ICU.  Final Clinical Impressions(s) / ED Diagnoses   Final diagnoses:  Acute respiratory failure with hypoxia [facility])    ED Discharge Orders    None       Duffy Bruce, MD 02/08/18 [phone]

## 2018-02-08 NOTE — ED Notes (Signed)
Nephew would like an update when possible at [phone]

## 2018-02-08 NOTE — ED Notes (Signed)
MD at bedside,  I will collect 2nd set of blood cultures after MD leave. (He may add orders)

## 2018-02-08 NOTE — Progress Notes (Signed)
McQuaid MD ordered a full respiratory panel. ED sent only influenza screen which was negative. Called microbiology, they will add the full respiratory panel onto the already collected specimen.

## 2018-02-08 NOTE — Progress Notes (Signed)
Patient extubated to 4L [place] per MD order. 175 mL of fentanyl wasted with Georgiann Cocker RN as a witness.

## 2018-02-08 NOTE — H&P (Signed)
PULMONARY / CRITICAL CARE MEDICINE   NAME:  Joanna [name], MRN:  [id number], DOB:  05/05/1960, LOS: 0 ADMISSION DATE:  02/08/2018, CONSULTATION DATE: 02/08/2018 REFERRING MD: Marcello Moores, CHIEF COMPLAINT: Shortness of breath  BRIEF HISTORY:    58 year old lady with history of congestive heart failure coming in with acute pulmonary edema hypertensive emergency and intubated.   HISTORY OF PRESENT ILLNESS   Joanna [name] is a 58 year old lady with history of nonischemic cardiomyopathy and congestive heart failure coming in as per brother with sudden onset of shortness of breath with frothy sputum.  Patient had some mild chest pressure no fever no chills no rigors.  Patient has no sputum and has been doing well until this past evening.  As per brother this is a very typical of her CHF exacerbation.  Patient continues to smoke and according to him she does not do any drugs.  No abdominal pain no diarrhea no focal weakness.  Patient was found to be hypertensive with systolics in the 180s chest x-ray has shown pulmonary edema patient was intubated for acute hypoxemic hypercapnic respiratory failure. When I came to assess patient patient was sedated intubated obviously not able to give any history due to sedation.   SIGNIFICANT PAST MEDICAL HISTORY    -COPD -Nonischemic cardiomyopathy ejection fraction 40% -Hypertension -History of prolonged QTc interval  SIGNIFICANT EVENTS:   STUDIES:    CULTURES:    ANTIBIOTICS:  Received azithromycin and ceftriaxone in the emergency room  LINES/TUBES:   ET tube 02/08/2018 CONSULTANTS:   SUBJECTIVE:    CONSTITUTIONAL: BP 112/68   Pulse 72   Temp (!) 97.2 F (36.2 C) (Temporal)   Resp (!) 22   Ht 5\' 11"  (1.803 m)   Wt 100 kg   SpO2 99%   BMI 30.75 kg/m   No intake/output data recorded.     Vent Mode: PRVC FiO2 (%):  [100 %] 100 % Set Rate:  [16 bmp] 16 bmp Vt Set:  [570 mL] 570 mL PEEP:  [5 cmH20] 5 cmH20 Plateau Pressure:  [20 cmH20] 20  cmH20  PHYSICAL EXAM: General: Acutely ill sedated on mechanical ventilation Neuro: Sedated not following any commands HEENT: Endotracheal tube in position Cardiovascular: Normal heart sound no added sounds or murmurs Lungs: Bilateral coarse crackles and wheezing no dullness Abdomen: Soft no tenderness no organomegaly Musculoskeletal:  No lower edema Skin: Patient has multiple scarring over her legs  RESOLVED PROBLEM LIST   ASSESSMENT AND PLAN   Assessment: -Acute hypoxemic hypercapnic respiratory failure requiring mechanical ventilation -Acute pulmonary edema -Acute systolic congestive heart failure -Acute COPD exacerbation -Hypertensive emergency   Plan: -Admit to the intensive care unit for further management -Start IV Lasix 40 mg every 12 hours -Propofol and fentanyl drips for sedation -DuoNeb scheduled -Patient did receive antibiotics in the emergency room but as per discussion with the brother it sounds more like acute pulmonary edema so we will hold off on continuing antibiotics -Blood pressure control -DVT prophylaxis -GI prophylaxis   I have spent 45 minutes of critical care time this time was spent bedside or in the unit this time is exclusive of any billable procedures patient is needing to scan due to acute respiratory failure requiring mechanical ventilation SUMMARY OF TODAY'S PLAN:    Best Practice / Goals of Care / Disposition.   DVT PROPHYLAXIS: Lovenox SUP: PPI NUTRITION: N.p.o. MOBILITY: Bedrest GOALS OF CARE: Full code FAMILY DISCUSSIONS: Discussed with brother at bedside DISPOSITION ICU admission  LABS  Glucose No results for input(s):  GLUCAP in the last 168 hours.  BMET Recent Labs  Lab 02/08/18 0501 02/08/18 0507  NA 145 142  K 3.6 3.6  CL 110 110  CO2  --  20*  BUN 23* 18  CREATININE 0.70 0.84  GLUCOSE 235* 239*    Liver Enzymes Recent Labs  Lab 02/08/18 0507  AST 139*  ALT 82*  ALKPHOS 93  BILITOT 0.9  ALBUMIN 3.6     Electrolytes Recent Labs  Lab 02/08/18 0507  CALCIUM 8.8*    CBC Recent Labs  Lab 02/08/18 0501 02/08/18 0507  WBC  --  13.6*  HGB 16.0* 13.0  HCT 47.0* 46.6*  PLT  --  282    ABG Recent Labs  Lab 02/08/18 0555  PHART 7.187*  PCO2ART 66.0*  PO2ART 185.0*    Coag's No results for input(s): APTT, INR in the last 168 hours.  Sepsis Markers Recent Labs  Lab 02/08/18 0502  LATICACIDVEN 4.90*    Cardiac Enzymes Recent Labs  Lab 02/08/18 0507  TROPONINI 0.17*    PAST MEDICAL HISTORY :   She  has a past medical history of Hypertension and MI (myocardial infarction) (HCC).  PAST SURGICAL HISTORY:  She  has a past surgical history that includes Back surgery and Appendectomy.  No Known Allergies  No current facility-administered medications on file prior to encounter.    Current Outpatient Medications on File Prior to Encounter  Medication Sig  . albuterol (PROVENTIL HFA;VENTOLIN HFA) 108 (90 Base) MCG/ACT inhaler Inhale 2 puffs into the lungs every 6 (six) hours as needed for wheezing or shortness of breath. (Patient not taking: Reported on 04/11/2015)  . aspirin 81 MG tablet Take 1 tablet (81 mg total) by mouth daily.  . Aspirin-Salicylamide-Caffeine (BC FAST PAIN RELIEF) 650-195-33.3 MG PACK Take 1 Package by mouth daily as needed (for headaches). Reported on 04/11/2015  . guaiFENesin (MUCINEX) 600 MG 12 hr tablet Take 1 tablet (600 mg total) by mouth 2 (two) times daily. (Patient not taking: Reported on 04/11/2015)  . guaiFENesin-dextromethorphan (ROBITUSSIN DM) 100-10 MG/5ML syrup Take 5 mLs by mouth every 4 (four) hours as needed for cough. (Patient not taking: Reported on 04/11/2015)  . lisinopril-hydrochlorothiazide (PRINZIDE,ZESTORETIC) 10-12.5 MG tablet Take 1 tablet by mouth daily.  . metoprolol (LOPRESSOR) 50 MG tablet Take 1 tablet (50 mg total) by mouth 2 (two) times daily.  . nicotine (NICODERM CQ - DOSED IN MG/24 HOURS) 21 mg/24hr patch Place 1 patch  (21 mg total) onto the skin daily. (Patient not taking: Reported on 04/11/2015)    FAMILY HISTORY:   Her family history is not on file.  SOCIAL HISTORY:  Could not be obtained due to patient altered mental status but as per brother patient continues to smoke but no alcohol or drug abuse.  REVIEW OF SYSTEMS:    Could not be obtained due to patient altered mental status

## 2018-02-08 NOTE — Progress Notes (Signed)
CRITICAL VALUE ALERT  Critical Value: MRSA PCR Positive (+)  Date & Time Notied: 02/08/2018 2122  Provider Notified: Not Applicable.  Orders Received/Actions taken: Hospital policy/Protocol followed.

## 2018-02-08 NOTE — ED Notes (Signed)
Nephew would like an update when possible at [phone] Niece Eunice Blase would like an update when possible at [phone]

## 2018-02-08 NOTE — ED Notes (Signed)
Pt nitro stopped and propofol decreased due to blood pressure; pt now gagging on tube, increased propofol per orders

## 2018-02-08 NOTE — Progress Notes (Signed)
[facility] Attending f/u rounds  S: admitted overnight/early this morning for respiratory failure with hypoxemia   O: Vitals:   02/08/18 1530 02/08/18 1545 02/08/18 1600 02/08/18 1615  BP: (!) 160/96 (!) 151/88 (!) 144/88 (!) 149/95  Pulse: 86 69 79 71  Resp: (!) 23 (!) 22 (!) 22 (!) 22  Temp:      TempSrc:      SpO2: 100% 100% 100% 100%  Weight:      Height:       Vent Mode: PRVC FiO2 (%):  [40 %-100 %] 40 % Set Rate:  [16 bmp-22 bmp] 22 bmp Vt Set:  [570 mL] 570 mL PEEP:  [5 cmH20] 5 cmH20 Plateau Pressure:  [16 cmH20-20 cmH20] 16 cmH20   Intake/Output Summary (Last 24 hours) at 02/08/2018 1635 Last data filed at 02/08/2018 1600 Gross per 24 hour  Intake 205.84 ml  Output 400 ml  Net -194.16 ml   Gen: well appearing HENT: OP clear, TM's clear, neck supple PULM: CTA B, normal percussion CV: RRR, no mgr, trace edema GI: BS+, soft, nontender Derm: no cyanosis or rash Psyche: normal mood and affect   CBC    Component Value Date/Time   WBC 13.6 (H) 02/08/2018 0507   RBC 4.88 02/08/2018 0507   HGB 13.0 02/08/2018 0507   HCT 46.6 (H) 02/08/2018 0507   PLT 282 02/08/2018 0507   MCV 95.5 02/08/2018 0507   MCH 26.6 02/08/2018 0507   MCHC 27.9 (L) 02/08/2018 0507   RDW 14.3 02/08/2018 0507   LYMPHSABS 7.5 (H) 02/08/2018 0507   MONOABS 0.7 02/08/2018 0507   EOSABS 0.5 02/08/2018 0507   BASOSABS 0.1 02/08/2018 0507    BMET    Component Value Date/Time   NA 142 02/08/2018 0507   K 3.6 02/08/2018 0507   CL 110 02/08/2018 0507   CO2 20 (L) 02/08/2018 0507   GLUCOSE 239 (H) 02/08/2018 0507   BUN 18 02/08/2018 0507   CREATININE 0.84 02/08/2018 0507   CALCIUM 8.8 (L) 02/08/2018 0507   GFRNONAA >60 02/08/2018 0507   GFRAA >60 02/08/2018 0507    CXR images reviewed showing cephalization of her vasculature bilaterally, some interstitial edema  Impression/Plan  Acute respiratory failure with hypoxemia: doing well on pressure support now, wean to extubate, check RSV,  BIPAP prn  Acute decompensated systolic heart failure: restart home metoprolol, continue lasix bid, repeat echo, check UDS, holding home HCTZ for now  My cc time 40 minutes  Heber [place], MD [facility] Pager: [phone] Cell: [phone] If no response, call [phone]

## 2018-02-08 NOTE — ED Notes (Signed)
Spoke with critical care about pt remaining awake after 1st push of fentanyl with Propofol infusing. Propofol @ 30 mcg with BPs 90s systolic. CCM states we can transition to a fentanyl drip.

## 2018-02-08 NOTE — Progress Notes (Signed)
The patient received from Kal, RT.

## 2018-02-09 ENCOUNTER — Other Ambulatory Visit (HOSPITAL_COMMUNITY): Payer: Medicare Other

## 2018-02-09 ENCOUNTER — Inpatient Hospital Stay (HOSPITAL_COMMUNITY): Payer: Medicare Other

## 2018-02-09 LAB — BASIC METABOLIC PANEL
ANION GAP: 7 (ref 5–15)
Anion gap: 11 (ref 5–15)
BUN: 13 mg/dL (ref 6–20)
BUN: 14 mg/dL (ref 6–20)
CO2: 26 mmol/L (ref 22–32)
CO2: 24 mmol/L (ref 22–32)
Calcium: 8.9 mg/dL (ref 8.9–10.3)
Calcium: 9.6 mg/dL (ref 8.9–10.3)
Chloride: 108 mmol/L (ref 98–111)
Chloride: 107 mmol/L (ref 98–111)
Creatinine, Ser: 0.55 mg/dL (ref 0.44–1.00)
Creatinine, Ser: 0.76 mg/dL (ref 0.44–1.00)
GFR calc Af Amer: >60 mL/min (ref >60)
GFR calc non Af Amer: >60 mL/min (ref >60)
GFR calc non Af Amer: >60 mL/min (ref >60)
Glucose, Bld: 108 mg/dL — ABNORMAL HIGH (ref 70–99)
Glucose, Bld: 108 mg/dL — ABNORMAL HIGH (ref 70–99)
Potassium: 3.2 mmol/L — ABNORMAL LOW (ref 3.5–5.1)
Potassium: 3.5 mmol/L (ref 3.5–5.1)
Sodium: 141 mmol/L (ref 135–145)
Sodium: 142 mmol/L (ref 135–145)

## 2018-02-09 LAB — CBC
HCT: 39.6 % (ref 36.0–46.0)
Hemoglobin: 12 g/dL (ref 12.0–15.0)
MCH: 26.8 pg (ref 26.0–34.0)
MCHC: 30.3 g/dL (ref 30.0–36.0)
MCV: 88.4 fL (ref 80.0–100.0)
Platelets: 232 10*3/uL (ref 150–400)
RBC: 4.48 MIL/uL (ref 3.87–5.11)
RDW: 14.1 % (ref 11.5–15.5)
WBC: 6.9 10*3/uL (ref 4.0–10.5)
nRBC: 0 % (ref 0.0–0.2)

## 2018-02-09 MED ORDER — ALBUTEROL SULFATE (2.5 MG/3ML) 0.083% IN NEBU: 2.5000 mg | INHALATION_SOLUTION | RESPIRATORY_TRACT | Status: DC | PRN

## 2018-02-09 MED ORDER — MUPIROCIN 2 % EX OINT
1.0000 "application " | TOPICAL_OINTMENT | Freq: Two times a day (BID) | CUTANEOUS | Status: DC
  Administered 2018-02-10: 1 via NASAL
  Filled 2018-02-09: qty 22

## 2018-02-09 MED ORDER — INFLUENZA VAC SPLIT QUAD 0.5 ML IM SUSY
0.5000 mL | PREFILLED_SYRINGE | INTRAMUSCULAR | Status: AC
  Administered 2018-02-10: 0.5 mL via INTRAMUSCULAR
  Filled 2018-02-09: qty 0.5

## 2018-02-09 MED ORDER — POTASSIUM CHLORIDE 10 MEQ/100ML IV SOLN
10.0000 meq | INTRAVENOUS | Status: AC
  Administered 2018-02-09 (×4): 10 meq via INTRAVENOUS
  Filled 2018-02-09 (×5): qty 100

## 2018-02-09 MED ORDER — PNEUMOCOCCAL VAC POLYVALENT 25 MCG/0.5ML IJ INJ
0.5000 mL | INJECTION | INTRAMUSCULAR | Status: AC
  Administered 2018-02-10: 0.5 mL via INTRAMUSCULAR
  Filled 2018-02-09: qty 0.5

## 2018-02-09 MED ORDER — IPRATROPIUM-ALBUTEROL 0.5-2.5 (3) MG/3ML IN SOLN
3.0000 mL | Freq: Three times a day (TID) | RESPIRATORY_TRACT | Status: DC
  Administered 2018-02-09: 3 mL via RESPIRATORY_TRACT
  Filled 2018-02-09: qty 3

## 2018-02-09 MED ORDER — CHLORHEXIDINE GLUCONATE CLOTH 2 % EX PADS
6.0000 | MEDICATED_PAD | Freq: Every day | CUTANEOUS | Status: DC
  Administered 2018-02-10: 6 via TOPICAL

## 2018-02-09 MED ORDER — LISINOPRIL 20 MG PO TABS
20.0000 mg | ORAL_TABLET | Freq: Every day | ORAL | Status: DC
  Administered 2018-02-10: 20 mg via ORAL
  Filled 2018-02-09: qty 1

## 2018-02-09 NOTE — Progress Notes (Addendum)
PULMONARY / CRITICAL CARE MEDICINE   NAME:  Joanna [name], MRN:  [id number], DOB:  Sep 21, 1960, LOS: 1 ADMISSION DATE:  02/08/2018, CONSULTATION DATE: 02/08/2018 REFERRING MD: Marcello Moores, CHIEF COMPLAINT: Shortness of breath  BRIEF HISTORY:    58 year old lady with history of congestive heart failure coming in with acute pulmonary edema hypertensive emergency and intubated.   HISTORY OF PRESENT ILLNESS   Joanna [name] is a 58 year old lady with history of nonischemic cardiomyopathy and congestive heart failure coming in as per brother with sudden onset of shortness of breath with frothy sputum.  Patient had some mild chest pressure no fever no chills no rigors.  Patient has no sputum and has been doing well until this past evening.  As per brother this is a very typical of her CHF exacerbation.  Patient continues to smoke and according to him she does not do any drugs.  No abdominal pain no diarrhea no focal weakness.  Patient was found to be hypertensive with systolics in the 180s chest x-ray has shown pulmonary edema patient was intubated for acute hypoxemic hypercapnic respiratory failure. When I came to assess patient patient was sedated intubated obviously not able to give any history due to sedation.   SIGNIFICANT PAST MEDICAL HISTORY    -COPD -Nonischemic cardiomyopathy ejection fraction 40% -Hypertension -History of prolonged QTc interval  SIGNIFICANT EVENTS:   STUDIES:    CULTURES:    ANTIBIOTICS:  Received azithromycin and ceftriaxone in the emergency room  LINES/TUBES:   ET tube 02/08/2018 >> extubated 02/08/2018 CONSULTANTS:   Interim history/subjective:  Overnight patient was found to be hypertensive with a map of 118, she started hydralazine 10 mg as needed.  She was again noted to be hypertensive and lisinopril 10 mg daily was added.  She was also noted to have hypokalemia at 3.2 this was repleted.  She was extubated last night.  Today patient reports that she is doing well,  her breathing is good and she has been walking around with no issues.  She denies any chest pain or palpitations today.  She has been urinating well.  SUBJECTIVE:    CONSTITUTIONAL: BP (!) 150/106   Pulse (!) 48   Temp 98 F (36.7 C) (Oral)   Resp (!) 27   Ht 5\' 11"  (1.803 m)   Wt 97.5 kg   SpO2 99%   BMI 29.98 kg/m   I/O last 3 completed shifts: In: 657.7 [P.O.:240; I.V.:165.2; IV Piggyback:252.6] Out: 1000 [Urine:1000]     FiO2 (%):  [40 %] 40 %  PHYSICAL EXAM: General: Well-appearing, no acute distress Neuro: Oriented x3, moves all extremities HEENT: Normocephalic, atraumatic Cardiovascular: Normal heart sound no added sounds or murmurs, no JVD Lungs: Clear to auscultation, no wheezing rhonchi or rails Abdomen: Soft no tenderness no organomegaly Musculoskeletal:  No lower extremity edema Skin: Patient has multiple scarring over her legs  RESOLVED PROBLEM LIST   ASSESSMENT AND PLAN    Acute hypoxemic hypercapnic respiratory failure requiring mechanical ventilation Acute pulmonary edema Acute systolic congestive heart failure Acute COPD exacerbation Patient is doing well today, extubated last night. She denies any new complaints. Leukocytosis has improved, remains afebrile. Appears euvolemic on exam today.  Plan: -Hold lasix today, restart as needed -Discontinue Propofol and fentanyl drips -DuoNeb PRN -Patient did receive antibiotics in the emergency room, will hold off on AB -Blood pressure control -DVT prophylaxis -GI prophylaxis -Stable for transfer out of unit -Patient will need PCP follow up on discharge, she can follow with [facility]  if she would like, included information in follow up providers  Hypertensive emergency: Patient reported that she was not taking any of her home medicaitons, she is on lisinopril-HCTZ 10-12.5 mg daily, and metoprolol 50 mg BID. This is likely what contributed to her elevated BP.  -Increase lisnopril to 20 mg  daily -Continue hydralazine 10 mg PRN  Hypokalemia: K was 3.2 this morning, repleted.  -BMP in afternoon  SUMMARY OF TODAY'S PLAN:    Best Practice / Goals of Care / Disposition.   DVT PROPHYLAXIS: Lovenox SUP: PPI NUTRITION: Heart healthy MOBILITY: Bedrest GOALS OF CARE: Full code FAMILY DISCUSSIONS: Discussed with patient DISPOSITION ICU   LABS  Glucose No results for input(s): GLUCAP in the last 168 hours.  BMET Recent Labs  Lab 02/08/18 0507 02/08/18 2232 02/09/18 0225  NA 142 142 141  K 3.6 3.5 3.2*  CL 110 110 108  CO2 20* 21* 26  BUN 18 16 13   CREATININE 0.84 0.56 0.55  GLUCOSE 239* 135* 108*    Liver Enzymes Recent Labs  Lab 02/08/18 0507  AST 139*  ALT 82*  ALKPHOS 93  BILITOT 0.9  ALBUMIN 3.6    Electrolytes Recent Labs  Lab 02/08/18 0507 02/08/18 2232 02/09/18 0225  CALCIUM 8.8* 9.0 8.9    CBC Recent Labs  Lab 02/08/18 0501 02/08/18 0507 02/09/18 0225  WBC  --  13.6* 6.9  HGB 16.0* 13.0 12.0  HCT 47.0* 46.6* 39.6  PLT  --  282 232    ABG Recent Labs  Lab 02/08/18 0555  PHART 7.187*  PCO2ART 66.0*  PO2ART 185.0*    Coag's No results for input(s): APTT, INR in the last 168 hours.  Sepsis Markers Recent Labs  Lab 02/08/18 0502 02/08/18 0657  LATICACIDVEN 4.90* 2.39*    Cardiac Enzymes Recent Labs  Lab 02/08/18 0507  TROPONINI 0.17*    PAST MEDICAL HISTORY :   She  has a past medical history of Hypertension and MI (myocardial infarction) (HCC).  PAST SURGICAL HISTORY:  She  has a past surgical history that includes Back surgery and Appendectomy.  Allergies  Allergen Reactions  . Other Other (See Comments)    Spicy foods- Cause a lot of heartburn    No current facility-administered medications on file prior to encounter.    Current Outpatient Medications on File Prior to Encounter  Medication Sig  . Aspirin-Salicylamide-Caffeine (BC HEADACHE POWDER PO) Take 1 packet by mouth See admin instructions.  Take 1 packet by mouth one to six times a day, depending on severity of headache  . albuterol (PROVENTIL HFA;VENTOLIN HFA) 108 (90 Base) MCG/ACT inhaler Inhale 2 puffs into the lungs every 6 (six) hours as needed for wheezing or shortness of breath. (Patient not taking: Reported on 02/08/2018)  . aspirin 81 MG tablet Take 1 tablet (81 mg total) by mouth daily. (Patient not taking: Reported on 02/08/2018)  . guaiFENesin (MUCINEX) 600 MG 12 hr tablet Take 1 tablet (600 mg total) by mouth 2 (two) times daily.  Marland Kitchen guaiFENesin-dextromethorphan (ROBITUSSIN DM) 100-10 MG/5ML syrup Take 5 mLs by mouth every 4 (four) hours as needed for cough. (Patient not taking: Reported on 02/08/2018)  . lisinopril-hydrochlorothiazide (PRINZIDE,ZESTORETIC) 10-12.5 MG tablet Take 1 tablet by mouth daily. (Patient not taking: Reported on 02/08/2018)  . metoprolol (LOPRESSOR) 50 MG tablet Take 1 tablet (50 mg total) by mouth 2 (two) times daily. (Patient not taking: Reported on 02/08/2018)  . nicotine (NICODERM CQ - DOSED IN MG/24 HOURS) 21 mg/24hr patch  Place 1 patch (21 mg total) onto the skin daily. (Patient not taking: Reported on 02/08/2018)    FAMILY HISTORY:   Her family history is not on file.  SOCIAL HISTORY:  Could not be obtained due to patient altered mental status but as per brother patient continues to smoke but no alcohol or drug abuse.  REVIEW OF SYSTEMS:    Could not be obtained due to patient altered mental status

## 2018-02-09 NOTE — Plan of Care (Signed)
  Problem: Education: Goal: Knowledge of General Education information will improve Description Including pain rating scale, medication(s)/side effects and non-pharmacologic comfort measures Outcome: Progressing   Problem: Health Behavior/Discharge Planning: Goal: Ability to manage health-related needs will improve Outcome: Progressing   Problem: Clinical Measurements: Goal: Will remain free from infection Outcome: Progressing Goal: Diagnostic test results will improve Outcome: Progressing Goal: Respiratory complications will improve Outcome: Progressing Goal: Cardiovascular complication will be avoided Outcome: Progressing   Problem: Activity: Goal: Risk for activity intolerance will decrease Outcome: Progressing   Problem: Nutrition: Goal: Adequate nutrition will be maintained Outcome: Progressing   Problem: Coping: Goal: Level of anxiety will decrease Outcome: Progressing   Problem: Elimination: Goal: Will not experience complications related to bowel motility Outcome: Progressing Goal: Will not experience complications related to urinary retention Outcome: Progressing   Problem: Pain Managment: Goal: General experience of comfort will improve Outcome: Progressing   Problem: Safety: Goal: Ability to remain free from injury will improve Outcome: Progressing   Problem: Skin Integrity: Goal: Risk for impaired skin integrity will decrease Outcome: Progressing   Problem: Activity: Goal: Capacity to carry out activities will improve Outcome: Progressing   Problem: Cardiac: Goal: Ability to achieve and maintain adequate cardiopulmonary perfusion will improve Outcome: Progressing   Problem: Clinical Measurements: Goal: Ability to maintain clinical measurements within normal limits will improve Outcome: Not Progressing   Patients BP elevated with SBP 150-180 and DBP greater than 100.

## 2018-02-09 NOTE — Progress Notes (Signed)
eLink Physician-Brief Progress Note Patient Name: Chritina Osier DOB: September 07, 1960 MRN: [id number]   Date of Service  02/09/2018  HPI/Events of Note  K+ = 3.2 and Creatinine = 0.55.   eICU Interventions  Will replace K+.      Intervention Category Major Interventions: Electrolyte abnormality - evaluation and management  Ramondo Dietze Eugene 02/09/2018, 5:05 AM

## 2018-02-09 NOTE — Progress Notes (Signed)
eLink Physician-Brief Progress Note Patient Name: Joanna [name] DOB: 21-Mar-1960 MRN: [id number]   Date of Service  02/09/2018  HPI/Events of Note  Thrombocytopenia - Platelet count = 28K. No evidence of bleeding.   eICU Interventions  Continue to monitor platelets.      Intervention Category Major Interventions: Electrolyte abnormality - evaluation and management Intermediate Interventions: Thrombocytopenia - evaluation and management  Roselee Tayloe Eugene 02/09/2018, 5:08 AM

## 2018-02-10 ENCOUNTER — Other Ambulatory Visit: Payer: Self-pay

## 2018-02-10 ENCOUNTER — Other Ambulatory Visit (HOSPITAL_COMMUNITY): Payer: Medicare Other

## 2018-02-10 DIAGNOSIS — I160 Hypertensive urgency: Secondary | ICD-10-CM

## 2018-02-10 MED ORDER — METOPROLOL TARTRATE 50 MG PO TABS: 50.0000 mg | ORAL_TABLET | Freq: Two times a day (BID) | ORAL | 0 refills | Status: DC

## 2018-02-10 MED ORDER — LISINOPRIL-HYDROCHLOROTHIAZIDE 10-12.5 MG PO TABS: 1.0000 | ORAL_TABLET | Freq: Every day | ORAL | 0 refills | Status: DC

## 2018-02-10 NOTE — Progress Notes (Signed)
Patient transferred from [facility] CVICU to 6E Room 2. Patient ambulated from CVICU room to 6E02 room. Patient tolerated ambulation well. No complaints of pain this morning. Patient reports feeling ready to go home today. Report provided to 6E night shift RN.

## 2018-02-10 NOTE — Discharge Summary (Signed)
Physician Discharge Summary  Joanna [name] XHB:[id number] DOB: Nov 17, 1960 DOA: 02/08/2018  PCP: Patient, No Pcp Per  Admit date: 02/08/2018 Discharge date: 02/10/2018  Admitted From: Home Disposition: Home   Recommendations for Outpatient Follow-up:  1. Patient to establish care with PCP as soon as possible following discharge. Was seen by Dr. Gwyneth Revels while in the ICU. 2. Please obtain BMP at follow up  Home Health: None Equipment/Devices: None Discharge Condition: Stable CODE STATUS: Full Diet recommendation: Heart healthy  Brief/Interim Summary: Joanna [name] is a 58 y.o. female with a history of HTN who presented with hypertensive emergency with pulmonary edema requiring intubation 1/4. With blood pressure control and diuresis she was successfully extubated 1/5, transferred to the floor and remained stable 1/6 for discharge. She reports not having taken her medications for a while and new prescriptions were provided at discharge. She plans on following up at the [facility].  Discharge Diagnoses:  Active Problems:   Acute respiratory failure with hypoxia (HCC)   Acute pulmonary edema (HCC)  Acute hypoxic, hypercapneic respiratory failure s/p intubation: Resolved  HTN with hypertensive emergency, acute pulmonary edema: Resolved.  - Titrated BP medications and prescribed at discharge.   Acute on chronic systolic CHF: Improved with lasix.   Acute COPD exacerbation: Documented by CCM. No evidence of this at discharge.   Hypokalemia:  - Consider hyperaldo work up. - Replace as indicated.   Discharge Instructions Discharge Instructions    Diet - low sodium heart healthy   Complete by:  As directed    Discharge instructions   Complete by:  As directed    You are stable for discharge with the following recommendations:  - Start taking lisinopril-HCTZ daily and metoprolol 50mg  twice daily. these will help control blood pressure which is the reason you  were admitted to the hospital. New prescriptions were printed for you to take to any pharmacy you prefer. - Follow up with a PCP. One of the doctors who saw you in the hospital also has clinic hours and it is recommended that you call the number listed to schedule a follow up appointment as soon as possible.  - If your symptoms return, seek medical attention right away.   Increase activity slowly   Complete by:  As directed      Allergies as of 02/10/2018      Reactions   Other Other (See Comments)   Spicy foods- Cause a lot of heartburn      Medication List    STOP taking these medications   albuterol 108 (90 Base) MCG/ACT inhaler Commonly known as:  PROVENTIL HFA;VENTOLIN HFA   aspirin 81 MG tablet   BC HEADACHE POWDER PO   guaiFENesin 600 MG 12 hr tablet Commonly known as:  MUCINEX   guaiFENesin-dextromethorphan 100-10 MG/5ML syrup Commonly known as:  ROBITUSSIN DM   nicotine 21 mg/24hr patch Commonly known as:  NICODERM CQ - dosed in mg/24 hours     TAKE these medications   lisinopril-hydrochlorothiazide 10-12.5 MG tablet Commonly known as:  PRINZIDE,ZESTORETIC Take 1 tablet by mouth daily.   metoprolol tartrate 50 MG tablet Commonly known as:  LOPRESSOR Take 1 tablet (50 mg total) by mouth 2 (two) times daily.      Follow-up Information    Claudean Severance, MD. Schedule an appointment as soon as possible for a visit in 1 week(s).   Specialty:  Internal Medicine Why:  Either here or with another Primary care provider Contact information: [address] [place] [address] [phone]          Allergies  Allergen Reactions  . Other Other (See Comments)    Spicy foods- Cause a lot of heartburn    Consultations:  CCM primary initially  Procedures/Studies: Dg Chest Port 1 View  Result Date: 02/09/2018 CLINICAL DATA:  Acute respiratory failure and hypoxia. Extubated. EXAM: PORTABLE CHEST 1 VIEW COMPARISON:  02/08/2018 FINDINGS: Endotracheal  tube and nasogastric tube have been removed. Cardiomegaly again demonstrated. Aortic atherosclerosis. Edema/infiltrate pattern has improved but not completely resolved. No dense consolidation, lobar collapse or effusion. IMPRESSION: Endotracheal tube and nasogastric tube removed. Improvement in edema/infiltrate pattern. Electronically Signed   By: Paulina Fusi M.D.   On: 02/09/2018 06:55   Dg Chest Port 1 View  Result Date: 02/08/2018 CLINICAL DATA:  Endotracheal tube placement. Respiratory distress. EXAM: PORTABLE CHEST 1 VIEW COMPARISON:  Chest radiograph performed 04/01/2015 FINDINGS: The patient's endotracheal tube is seen ending 6 cm above the carina. An enteric tube is noted extending below the diaphragm. Vascular congestion is noted. Bilateral midlung airspace opacification may reflect pneumonia or pulmonary edema. No significant pleural effusion or pneumothorax is seen. The cardiomediastinal silhouette is mildly enlarged. No acute osseous abnormalities are identified. IMPRESSION: 1. Endotracheal tube seen ending 6 cm above the carina. 2. Vascular congestion and mild cardiomegaly. Bilateral midlung airspace opacification may reflect pneumonia or pulmonary edema. Electronically Signed   By: Roanna Raider M.D.   On: 02/08/2018 05:23      Subjective: Walked from the 2nd floor to 6 east. No shortness of breath, no leg swelling, no orthopnea. Wants to go home ASAP.  Discharge Exam: Vitals:   02/10/18 0823 02/10/18 1036  BP:  (!) 152/104  Pulse: 76   Resp:    Temp:    SpO2:     General: Pt is alert, awake, not in acute distress Cardiovascular: RRR, S1/S2 +, no rubs, no gallops Respiratory: CTA bilaterally, no wheezing, no rhonchi Abdominal: Soft, NT, ND, bowel sounds + Extremities: No edema, no cyanosis  Labs: BNP (last 3 results) Recent Labs    02/08/18 0507  BNP 532.9*   Basic Metabolic Panel: Recent Labs  Lab 02/08/18 0501 02/08/18 0507 02/08/18 2232 02/09/18 0225  02/09/18 1925  NA 145 142 142 141 142  K 3.6 3.6 3.5 3.2* 3.5  CL 110 110 110 108 107  CO2  --  20* 21* 26 24  GLUCOSE 235* 239* 135* 108* 108*  BUN 23* 18 16 13 14   CREATININE 0.70 0.84 0.56 0.55 0.76  CALCIUM  --  8.8* 9.0 8.9 9.6   Liver Function Tests: Recent Labs  Lab 02/08/18 0507  AST 139*  ALT 82*  ALKPHOS 93  BILITOT 0.9  PROT 8.3*  ALBUMIN 3.6   No results for input(s): LIPASE, AMYLASE in the last 168 hours. No results for input(s): AMMONIA in the last 168 hours. CBC: Recent Labs  Lab 02/08/18 0501 02/08/18 0507 02/09/18 0225  WBC  --  13.6* 6.9  NEUTROABS  --  4.8  --   HGB 16.0* 13.0 12.0  HCT 47.0* 46.6* 39.6  MCV  --  95.5 88.4  PLT  --  282 232   Cardiac Enzymes: Recent Labs  Lab 02/08/18 0507  TROPONINI 0.17*   BNP: Invalid input(s): POCBNP CBG: No results for input(s): GLUCAP in the last 168 hours. D-Dimer No results for input(s): DDIMER in the last 72 hours. Hgb A1c No results for input(s): HGBA1C in the last  72 hours. Lipid Profile Recent Labs    02/08/18 0511  TRIG 88   Thyroid function studies No results for input(s): TSH, T4TOTAL, T3FREE, THYROIDAB in the last 72 hours.  Invalid input(s): FREET3 Anemia work up No results for input(s): VITAMINB12, FOLATE, FERRITIN, TIBC, IRON, RETICCTPCT in the last 72 hours. Urinalysis    Component Value Date/Time   COLORURINE YELLOW 04/01/2015 2118   APPEARANCEUR CLOUDY (A) 04/01/2015 2118   LABSPEC 1.015 04/01/2015 2118   PHURINE 6.0 04/01/2015 2118   GLUCOSEU NEGATIVE 04/01/2015 2118   HGBUR TRACE (A) 04/01/2015 2118   BILIRUBINUR NEGATIVE 04/01/2015 2118   KETONESUR NEGATIVE 04/01/2015 2118   PROTEINUR 30 (A) 04/01/2015 2118   NITRITE POSITIVE (A) 04/01/2015 2118   LEUKOCYTESUR MODERATE (A) 04/01/2015 2118    Microbiology Recent Results (from the past 240 hour(s))  Blood culture (routine x 2)     Status: None (Preliminary result)   Collection Time: 02/08/18  4:50 AM  Result  Value Ref Range Status   Specimen Description BLOOD RIGHT FOREARM  Final   Special Requests   Final    BOTTLES DRAWN AEROBIC AND ANAEROBIC Blood Culture adequate volume Performed at [facility], [address] [place], [place] [address]    Culture NO GROWTH 1 DAY  Final   Report Status PENDING  Incomplete  Blood culture (routine x 2)     Status: None (Preliminary result)   Collection Time: 02/08/18  6:17 AM  Result Value Ref Range Status   Specimen Description BLOOD BLOOD LEFT HAND  Final   Special Requests   Final    BOTTLES DRAWN AEROBIC AND ANAEROBIC Blood Culture results may not be optimal due to an inadequate volume of blood received in culture bottles   Culture   Final    NO GROWTH 1 DAY Performed at [facility], [address] [place], [place] [address]    Report Status PENDING  Incomplete  Respiratory Panel by PCR     Status: None   Collection Time: 02/08/18  6:39 AM  Result Value Ref Range Status   Adenovirus NOT DETECTED NOT DETECTED Final   Coronavirus 229E NOT DETECTED NOT DETECTED Final   Coronavirus HKU1 NOT DETECTED NOT DETECTED Final   Coronavirus NL63 NOT DETECTED NOT DETECTED Final   Coronavirus OC43 NOT DETECTED NOT DETECTED Final   Metapneumovirus NOT DETECTED NOT DETECTED Final   Rhinovirus / Enterovirus NOT DETECTED NOT DETECTED Final   Influenza A NOT DETECTED NOT DETECTED Final   Influenza B NOT DETECTED NOT DETECTED Final   Parainfluenza Virus 1 NOT DETECTED NOT DETECTED Final   Parainfluenza Virus 2 NOT DETECTED NOT DETECTED Final   Parainfluenza Virus 3 NOT DETECTED NOT DETECTED Final   Parainfluenza Virus 4 NOT DETECTED NOT DETECTED Final   Respiratory Syncytial Virus NOT DETECTED NOT DETECTED Final   Bordetella pertussis NOT DETECTED NOT DETECTED Final   Chlamydophila pneumoniae NOT DETECTED NOT DETECTED Final   Mycoplasma pneumoniae NOT DETECTED NOT DETECTED Final  MRSA PCR Screening     Status: Abnormal   Collection Time: 02/08/18   3:34 PM  Result Value Ref Range Status   MRSA by PCR POSITIVE (A) NEGATIVE Final    Comment:        The GeneXpert MRSA Assay (FDA approved for NASAL specimens only), is one component of a comprehensive MRSA colonization surveillance program. It is not intended to diagnose MRSA infection nor to guide or monitor treatment for MRSA infections. RESULT  CALLED TO, READ BACK BY AND VERIFIED WITHDarlys Gales RN [id number] BY GF Performed at [facility], [address] [place], [place] [address]     Time coordinating discharge: Approximately 40 minutes  Tyrone Nine, MD  [facility] 02/10/2018, 11:31 AM Pager [phone]

## 2018-02-13 LAB — CULTURE, BLOOD (ROUTINE X 2)
Culture: NO GROWTH
Culture: NO GROWTH
Special Requests: ADEQUATE

## 2018-03-08 ENCOUNTER — Encounter (HOSPITAL_COMMUNITY): Payer: Self-pay | Admitting: Emergency Medicine

## 2018-03-08 ENCOUNTER — Other Ambulatory Visit: Payer: Self-pay

## 2018-03-08 ENCOUNTER — Emergency Department (HOSPITAL_COMMUNITY): Payer: Medicare Other

## 2018-03-08 ENCOUNTER — Observation Stay (HOSPITAL_BASED_OUTPATIENT_CLINIC_OR_DEPARTMENT_OTHER): Payer: Medicare Other

## 2018-03-08 ENCOUNTER — Inpatient Hospital Stay (HOSPITAL_COMMUNITY)
Admission: EM | Admit: 2018-03-08 | Discharge: 2018-03-15 | DRG: 308 | Disposition: A | Discharge status: Home / Self Care | Payer: Medicare Other | Attending: Internal Medicine | Admitting: Internal Medicine

## 2018-03-08 DIAGNOSIS — I499 Cardiac arrhythmia, unspecified: Secondary | ICD-10-CM | POA: Diagnosis not present

## 2018-03-08 DIAGNOSIS — I471 Supraventricular tachycardia: Secondary | ICD-10-CM | POA: Diagnosis not present

## 2018-03-08 DIAGNOSIS — I10 Essential (primary) hypertension: Secondary | ICD-10-CM | POA: Diagnosis not present

## 2018-03-08 DIAGNOSIS — Z91018 Allergy to other foods: Secondary | ICD-10-CM

## 2018-03-08 DIAGNOSIS — R079 Chest pain, unspecified: Secondary | ICD-10-CM | POA: Diagnosis not present

## 2018-03-08 DIAGNOSIS — I370 Nonrheumatic pulmonary valve stenosis: Secondary | ICD-10-CM

## 2018-03-08 DIAGNOSIS — I340 Nonrheumatic mitral (valve) insufficiency: Secondary | ICD-10-CM | POA: Diagnosis present

## 2018-03-08 DIAGNOSIS — I5021 Acute systolic (congestive) heart failure: Secondary | ICD-10-CM | POA: Diagnosis present

## 2018-03-08 DIAGNOSIS — I272 Pulmonary hypertension, unspecified: Secondary | ICD-10-CM | POA: Diagnosis present

## 2018-03-08 DIAGNOSIS — Z720 Tobacco use: Secondary | ICD-10-CM | POA: Diagnosis present

## 2018-03-08 DIAGNOSIS — I428 Other cardiomyopathies: Secondary | ICD-10-CM | POA: Diagnosis present

## 2018-03-08 DIAGNOSIS — I4891 Unspecified atrial fibrillation: Secondary | ICD-10-CM | POA: Diagnosis not present

## 2018-03-08 DIAGNOSIS — I5023 Acute on chronic systolic (congestive) heart failure: Secondary | ICD-10-CM | POA: Diagnosis not present

## 2018-03-08 DIAGNOSIS — Z87891 Personal history of nicotine dependence: Secondary | ICD-10-CM

## 2018-03-08 DIAGNOSIS — T502X5A Adverse effect of carbonic-anhydrase inhibitors, benzothiadiazides and other diuretics, initial encounter: Secondary | ICD-10-CM | POA: Diagnosis not present

## 2018-03-08 DIAGNOSIS — Z9119 Patient's noncompliance with other medical treatment and regimen: Secondary | ICD-10-CM

## 2018-03-08 DIAGNOSIS — I480 Paroxysmal atrial fibrillation: Principal | ICD-10-CM | POA: Diagnosis present

## 2018-03-08 DIAGNOSIS — X58XXXA Exposure to other specified factors, initial encounter: Secondary | ICD-10-CM | POA: Diagnosis not present

## 2018-03-08 DIAGNOSIS — E876 Hypokalemia: Secondary | ICD-10-CM | POA: Diagnosis present

## 2018-03-08 DIAGNOSIS — I517 Cardiomegaly: Secondary | ICD-10-CM | POA: Diagnosis not present

## 2018-03-08 DIAGNOSIS — Z8249 Family history of ischemic heart disease and other diseases of the circulatory system: Secondary | ICD-10-CM

## 2018-03-08 DIAGNOSIS — I160 Hypertensive urgency: Secondary | ICD-10-CM | POA: Diagnosis not present

## 2018-03-08 DIAGNOSIS — I5043 Acute on chronic combined systolic (congestive) and diastolic (congestive) heart failure: Secondary | ICD-10-CM | POA: Diagnosis not present

## 2018-03-08 DIAGNOSIS — I472 Ventricular tachycardia: Secondary | ICD-10-CM | POA: Diagnosis present

## 2018-03-08 DIAGNOSIS — R000 Tachycardia, unspecified: Secondary | ICD-10-CM | POA: Diagnosis not present

## 2018-03-08 DIAGNOSIS — R739 Hyperglycemia, unspecified: Secondary | ICD-10-CM | POA: Diagnosis present

## 2018-03-08 DIAGNOSIS — Z7982 Long term (current) use of aspirin: Secondary | ICD-10-CM

## 2018-03-08 DIAGNOSIS — Z79899 Other long term (current) drug therapy: Secondary | ICD-10-CM

## 2018-03-08 DIAGNOSIS — R0602 Shortness of breath: Secondary | ICD-10-CM | POA: Diagnosis not present

## 2018-03-08 DIAGNOSIS — I252 Old myocardial infarction: Secondary | ICD-10-CM

## 2018-03-08 DIAGNOSIS — J449 Chronic obstructive pulmonary disease, unspecified: Secondary | ICD-10-CM | POA: Diagnosis present

## 2018-03-08 DIAGNOSIS — I110 Hypertensive heart disease with heart failure: Secondary | ICD-10-CM | POA: Diagnosis present

## 2018-03-08 HISTORY — DX: Unspecified atrial fibrillation: I48.91

## 2018-03-08 HISTORY — DX: Chronic combined systolic (congestive) and diastolic (congestive) heart failure: I50.42

## 2018-03-08 HISTORY — DX: Nontoxic goiter, unspecified: E04.9

## 2018-03-08 HISTORY — DX: Dyspnea, unspecified: R06.00

## 2018-03-08 HISTORY — DX: Other cardiomyopathies: I42.8

## 2018-03-08 LAB — CBC WITH DIFFERENTIAL/PLATELET
Abs Immature Granulocytes: 0.02 10*3/uL (ref 0.00–0.07)
Basophils Absolute: 0 10*3/uL (ref 0.0–0.1)
Basophils Relative: 1 %
Eosinophils Absolute: 0.2 10*3/uL (ref 0.0–0.5)
Eosinophils Relative: 2 %
HCT: 43.8 % (ref 36.0–46.0)
Hemoglobin: 12.9 g/dL (ref 12.0–15.0)
Immature Granulocytes: 0 %
Lymphocytes Relative: 32 %
Lymphs Abs: 2.1 10*3/uL (ref 0.7–4.0)
MCH: 27 pg (ref 26.0–34.0)
MCHC: 29.5 g/dL — ABNORMAL LOW (ref 30.0–36.0)
MCV: 91.6 fL (ref 80.0–100.0)
Monocytes Absolute: 0.4 10*3/uL (ref 0.1–1.0)
Monocytes Relative: 7 %
Neutro Abs: 3.8 10*3/uL (ref 1.7–7.7)
Neutrophils Relative %: 58 %
Platelets: 185 10*3/uL (ref 150–400)
RBC: 4.78 MIL/uL (ref 3.87–5.11)
RDW: 15 % (ref 11.5–15.5)
WBC: 6.5 10*3/uL (ref 4.0–10.5)
nRBC: 0.3 % — ABNORMAL HIGH (ref 0.0–0.2)

## 2018-03-08 LAB — BASIC METABOLIC PANEL
Anion gap: 11 (ref 5–15)
BUN: 21 mg/dL — ABNORMAL HIGH (ref 6–20)
CO2: 22 mmol/L (ref 22–32)
Calcium: 9.2 mg/dL (ref 8.9–10.3)
Chloride: 111 mmol/L (ref 98–111)
Creatinine, Ser: 0.85 mg/dL (ref 0.44–1.00)
GFR calc Af Amer: >60 mL/min (ref >60)
GFR calc non Af Amer: >60 mL/min (ref >60)
Glucose, Bld: 130 mg/dL — ABNORMAL HIGH (ref 70–99)
Potassium: 3.4 mmol/L — ABNORMAL LOW (ref 3.5–5.1)
Sodium: 144 mmol/L (ref 135–145)

## 2018-03-08 LAB — ECHOCARDIOGRAM COMPLETE
Height: 70 in
Weight: 3472 oz

## 2018-03-08 LAB — RAPID URINE DRUG SCREEN, HOSP PERFORMED
Amphetamines: NOT DETECTED
Barbiturates: NOT DETECTED
Benzodiazepines: NOT DETECTED
Cocaine: NOT DETECTED
OPIATES: NOT DETECTED
Tetrahydrocannabinol: NOT DETECTED

## 2018-03-08 LAB — HEMOGLOBIN A1C
Hgb A1c MFr Bld: 5.4 % (ref 4.8–5.6)
Mean Plasma Glucose: 108.28 mg/dL

## 2018-03-08 LAB — I-STAT TROPONIN, ED: Troponin i, poc: 0.02 ng/mL (ref 0.00–0.08)

## 2018-03-08 LAB — TROPONIN I
Troponin I: 0.06 ng/mL (ref <0.03)
Troponin I: 0.06 ng/mL (ref <0.03)

## 2018-03-08 LAB — TSH: TSH: 1.988 u[IU]/mL (ref 0.350–4.500)

## 2018-03-08 LAB — BRAIN NATRIURETIC PEPTIDE: B Natriuretic Peptide: 270.4 pg/mL — ABNORMAL HIGH (ref 0.0–100.0)

## 2018-03-08 LAB — HEPARIN LEVEL (UNFRACTIONATED): Heparin Unfractionated: 0.68 IU/mL (ref 0.30–0.70)

## 2018-03-08 LAB — T4, FREE: Free T4: 0.9 ng/dL (ref 0.82–1.77)

## 2018-03-08 MED ORDER — DIPHENHYDRAMINE HCL 25 MG PO CAPS
25.0000 mg | ORAL_CAPSULE | Freq: Once | ORAL | Status: AC
  Administered 2018-03-08: 25 mg via ORAL
  Filled 2018-03-08: qty 1

## 2018-03-08 MED ORDER — METOPROLOL TARTRATE 50 MG PO TABS: 50.0000 mg | ORAL_TABLET | Freq: Two times a day (BID) | ORAL | Status: DC

## 2018-03-08 MED ORDER — CARVEDILOL 6.25 MG PO TABS
6.2500 mg | ORAL_TABLET | Freq: Two times a day (BID) | ORAL | Status: DC
  Administered 2018-03-08 – 2018-03-09 (×2): 6.25 mg via ORAL
  Filled 2018-03-08 (×2): qty 1

## 2018-03-08 MED ORDER — DILTIAZEM HCL-DEXTROSE 100-5 MG/100ML-% IV SOLN (PREMIX)
5.0000 mg/h | INTRAVENOUS | Status: DC
  Administered 2018-03-08: 5 mg/h via INTRAVENOUS
  Filled 2018-03-08: qty 100

## 2018-03-08 MED ORDER — DILTIAZEM LOAD VIA INFUSION
10.0000 mg | Freq: Once | INTRAVENOUS | Status: AC
  Administered 2018-03-08: 10 mg via INTRAVENOUS
  Filled 2018-03-08: qty 10

## 2018-03-08 MED ORDER — HEPARIN (PORCINE) 25000 UT/250ML-% IV SOLN
1250.0000 [IU]/h | INTRAVENOUS | Status: DC
  Administered 2018-03-08 – 2018-03-10 (×3): 1250 [IU]/h via INTRAVENOUS
  Filled 2018-03-08 (×3): qty 250

## 2018-03-08 MED ORDER — FUROSEMIDE 10 MG/ML IJ SOLN
20.0000 mg | Freq: Once | INTRAMUSCULAR | Status: AC
  Administered 2018-03-08: 20 mg via INTRAVENOUS
  Filled 2018-03-08: qty 2

## 2018-03-08 MED ORDER — ACETAMINOPHEN 325 MG PO TABS
650.0000 mg | ORAL_TABLET | ORAL | Status: DC | PRN
  Administered 2018-03-08: 650 mg via ORAL
  Filled 2018-03-08: qty 2

## 2018-03-08 MED ORDER — ONDANSETRON HCL 4 MG/2ML IJ SOLN: 4.0000 mg | Freq: Four times a day (QID) | INTRAMUSCULAR | Status: DC | PRN

## 2018-03-08 MED ORDER — DILTIAZEM HCL-DEXTROSE 100-5 MG/100ML-% IV SOLN (PREMIX)
5.0000 mg/h | INTRAVENOUS | Status: DC
  Administered 2018-03-08 – 2018-03-09 (×3): 10 mg/h via INTRAVENOUS
  Administered 2018-03-10 (×2): 5 mg/h via INTRAVENOUS
  Filled 2018-03-08 (×5): qty 100

## 2018-03-08 MED ORDER — METOPROLOL TARTRATE 5 MG/5ML IV SOLN
5.0000 mg | INTRAVENOUS | Status: DC | PRN
  Administered 2018-03-08 (×2): 5 mg via INTRAVENOUS
  Filled 2018-03-08 (×2): qty 5

## 2018-03-08 MED ORDER — HEPARIN BOLUS VIA INFUSION
4450.0000 [IU] | Freq: Once | INTRAVENOUS | Status: AC
  Administered 2018-03-08: 4450 [IU] via INTRAVENOUS
  Filled 2018-03-08: qty 4450

## 2018-03-08 NOTE — ED Notes (Signed)
EKG provided to EDP

## 2018-03-08 NOTE — Progress Notes (Addendum)
ANTICOAGULATION CONSULT NOTE - Initial Consult  Pharmacy Consult for heparin Indication: atrial fibrillation  Allergies  Allergen Reactions  . Other Other (See Comments)    Spicy foods- Cause a lot of heartburn    Patient Measurements: Height: 5\' 10"  (177.8 cm) Weight: 217 lb (98.4 kg) IBW/kg (Calculated) : 68.5 Heparin Dosing Weight: 89.1 kg  Vital Signs: Temp: 97.7 F (36.5 C) (02/01 1224) Temp Source: Oral (02/01 1224) BP: 156/104 (02/01 1224) Pulse Rate: 116 (02/01 1224)  Labs: Recent Labs    03/08/18 0942  HGB 12.9  HCT 43.8  PLT 185  CREATININE 0.85    Estimated Creatinine Clearance: 91.7 mL/min (by C-G formula based on SCr of 0.85 mg/dL).   Medical History: Past Medical History:  Diagnosis Date  . Hypertension   . MI (myocardial infarction) (HCC) 2005  . Pulmonary edema     Medications:  Scheduled:  . metoprolol tartrate  50 mg Oral BID    Assessment: 57 yof presenting with SOB on exertion, found to be in Afib RVR. No anticoag PTA.   Hgb 12.9, plt 185. No s/sx of bleeding. No infusion issues. ECHO pending.   Goal of Therapy:  Heparin level 0.3-0.7 units/ml Monitor platelets by anticoagulation protocol: Yes  Plan:  Give 4450 units bolus x 1 Start heparin infusion at 1250 units/hr Check anti-Xa level in 6 hours and daily while on heparin Continue to monitor H&H and platelets  Sherron Monday, PharmD, BCCCP Clinical Pharmacist  Pager: [phone] Phone: [phone] 03/08/2018,12:43 PM  ADDENDUM Heparin level came back therapeutic at 0.68, on 1250 units/hr. Hgb 12.9, plt 185. No s/sx of bleeding. No infusion issues. Will continue at same rate and get level with AM labs.   Sherron Monday, PharmD, BCCCP Clinical Pharmacist

## 2018-03-08 NOTE — ED Provider Notes (Signed)
[facility] EMERGENCY DEPARTMENT Provider Note   CSN: [id number] Arrival date & time: 03/08/18  4492     History   Chief Complaint Chief Complaint  Patient presents with  . Shortness of Breath    HPI Joanna [name] is a 58 y.o. female with history of CHF, MI, hypertension, tobacco abuse, COPD presents for evaluation of acute onset, progressively worsening shortness of breath for approximately 1 week.  She reports dyspnea on exertion, some orthopnea.  Denies any chest pain currently but has been having intermittent chest pain to the midline radiating to the back.  No fevers, chills, or cough.  She quit smoking after her recent admission for acute respiratory failure with pulmonary edema on 02/08/2018 discharged on 02/10/2018.  Per EMS, she was somewhat hypoxic and tachycardic to 190 bpm on arrival.  She received 6 mg of adenosine with minimal improvement of her heart rate.  Presently she denies any lightheadedness, nausea, or chest pain. She denies recreational drug use.    The history is provided by the patient.    Past Medical History:  Diagnosis Date  . Chronic combined systolic (congestive) and diastolic (congestive) heart failure (HCC)   . Goiter   . Hypertension   . NICM (nonischemic cardiomyopathy) (HCC)    a. 08/2005 Echo: EF 30-35%, mod diff HK. Mild to Mod MR. Mildly dil LA; b. 08/2005 Cath: Nl Cors. Elevated CO w/o shunt; c. 09/2007 Echo: EF 45%. Mild to mod MR; c. 03/2015 Echo: EF 45%, global HK. Gr1 DD. Mild MR. Mildly dil LA.    Patient Active Problem List   Diagnosis Date Noted  . Atrial fibrillation with RVR (HCC) 03/08/2018  . Accelerated hypertension 03/08/2018  . Hyperglycemia 03/08/2018  . Acute pulmonary edema (HCC) 02/08/2018  . Acute systolic (congestive) heart failure (HCC)   . Hypertensive urgency 04/11/2015  . Sepsis (HCC) 04/01/2015  . CAP (community acquired pneumonia) 04/01/2015  . Dehydration 04/01/2015  . AKI (acute kidney injury)  (HCC) 04/01/2015  . Acute respiratory failure with hypoxia (HCC) 04/01/2015  . Tobacco abuse 04/01/2015  . Nausea vomiting and diarrhea 04/01/2015  . COPD exacerbation (HCC) 04/01/2015    Past Surgical History:  Procedure Laterality Date  . APPENDECTOMY    . BACK SURGERY       OB History   No obstetric history on file.      Home Medications    Prior to Admission medications   Medication Sig Start Date End Date Taking? Authorizing Provider  Aspirin-Salicylamide-Caffeine (BC HEADACHE POWDER PO) Take 1 packet by mouth daily as needed. pain   Yes [provider]  lisinopril-hydrochlorothiazide (PRINZIDE,ZESTORETIC) 10-12.5 MG tablet Take 1 tablet by mouth daily. 02/10/18  Yes Tyrone Nine, MD  metoprolol tartrate (LOPRESSOR) 50 MG tablet Take 1 tablet (50 mg total) by mouth 2 (two) times daily. 02/10/18  Yes Tyrone Nine, MD    Family History Family History  Problem Relation Age of Onset  . Cancer Mother   . Heart disease Father     Social History Social History   Tobacco Use  . Smoking status: Former Smoker    Packs/day: 2.00    Years: 30.00    Pack years: 60.00    Last attempt to quit: 02/08/2018    Years since quitting: 0.0  . Smokeless tobacco: Never Used  Substance Use Topics  . Alcohol use: No    Comment: quit drinking year ago  . Drug use: No     Allergies  Other   Review of Systems Review of Systems  Constitutional: Negative for chills and fever.  Respiratory: Positive for shortness of breath.   Cardiovascular: Positive for chest pain.  Gastrointestinal: Negative for nausea and vomiting.  Musculoskeletal: Positive for back pain.  All other systems reviewed and are negative.    Physical Exam Updated Vital Signs BP (!) 156/104 (BP Location: Left Arm)   Pulse (!) 116   Temp 97.7 F (36.5 C) (Oral)   Resp 16   Ht 5\' 10"  (1.778 m)   Wt 98.4 kg   SpO2 100%   BMI 31.14 kg/m   Physical Exam Vitals signs and nursing note reviewed.    Constitutional:      General: She is not in acute distress.    Appearance: She is well-developed.  HENT:     Head: Normocephalic and atraumatic.  Eyes:     General:        Right eye: No discharge.        Left eye: No discharge.     Conjunctiva/sclera: Conjunctivae normal.  Neck:     Vascular: No JVD.     Trachea: No tracheal deviation.  Cardiovascular:     Rate and Rhythm: Tachycardia present.     Pulses: Normal pulses.     Comments: Tachycardic to 180 bpm, 2+ radial and DP/PT pulses bilaterally, Homans sign absent bilaterally, no lower extremity edema, no palpable cords, compartments are soft  Pulmonary:     Effort: Pulmonary effort is normal. Tachypnea present.     Comments: Scattered rhonchi, globally diminished breath sounds.  Abdominal:     General: There is no distension.     Palpations: Abdomen is soft.     Tenderness: There is no abdominal tenderness. There is no guarding or rebound.  Musculoskeletal:     Right lower leg: She exhibits no tenderness. No edema.     Left lower leg: She exhibits no tenderness. No edema.  Skin:    General: Skin is warm and dry.     Findings: No erythema.  Neurological:     Mental Status: She is alert.  Psychiatric:        Behavior: Behavior normal.      ED Treatments / Results  Labs (all labs ordered are listed, but only abnormal results are displayed) Labs Reviewed  CBC WITH DIFFERENTIAL/PLATELET - Abnormal; Notable for the following components:      Result Value   MCHC 29.5 (*)    nRBC 0.3 (*)    All other components within normal limits  BASIC METABOLIC PANEL - Abnormal; Notable for the following components:   Potassium 3.4 (*)    Glucose, Bld 130 (*)    BUN 21 (*)    All other components within normal limits  BRAIN NATRIURETIC PEPTIDE - Abnormal; Notable for the following components:   B Natriuretic Peptide 270.4 (*)    All other components within normal limits  HEMOGLOBIN A1C  TSH  T4, FREE  HEPARIN LEVEL  (UNFRACTIONATED)  TROPONIN I  TROPONIN I  RAPID URINE DRUG SCREEN, HOSP PERFORMED  I-STAT TROPONIN, ED    EKG EKG Interpretation  Date/Time:  Saturday March 08 2018 09:52:03 EST Ventricular Rate:  111 PR Interval:    QRS Duration: 106 QT Interval:  319 QTC Calculation: 434 R Axis:   -14 Text Interpretation:  Atrial fibrillation LVH with secondary repolarization abnormality Baseline wander in lead(s) II aVR aVF Confirmed by Virgina Norfolk [id number]) on 03/08/2018 10:34:47 AM   Radiology  Dg Chest Portable 1 View  Result Date: 03/08/2018 CLINICAL DATA:  Chest pain and shortness of breath today. EXAM: PORTABLE CHEST 1 VIEW COMPARISON:  Single-view of the chest 02/09/2018, 02/08/2018 and 08/29/2005. CT chest 08/29/2005. FINDINGS: There is marked enlargement of the cardiopericardial silhouette. No pulmonary edema. Lungs are clear. No pneumothorax or pleural effusion. IMPRESSION: Markedly enlarged cardiopericardial silhouette consistent with cardiomegaly and/or pericardial effusion. Lungs are clear. Electronically Signed   By: Drusilla Kannerhomas  Dalessio M.D.   On: 03/08/2018 09:43    Procedures .Critical Care Performed by: Jeanie SewerFawze, Caelin Rosen A, PA-C Authorized by: Jeanie SewerFawze, Leitha Hyppolite A, PA-C   Critical care provider statement:    Critical care time (minutes):  40   Critical care was necessary to treat or prevent imminent or life-threatening deterioration of the following conditions:  Cardiac failure (a fib with RVR)   Critical care was time spent personally by me on the following activities:  Discussions with consultants, evaluation of patient's response to treatment, examination of patient, ordering and performing treatments and interventions, ordering and review of laboratory studies, ordering and review of radiographic studies, pulse oximetry, re-evaluation of patient's condition, obtaining history from patient or surrogate and review of old charts   (including critical care time)  Medications Ordered in  ED Medications  metoprolol tartrate (LOPRESSOR) tablet 50 mg (has no administration in time range)  acetaminophen (TYLENOL) tablet 650 mg (has no administration in time range)  ondansetron (ZOFRAN) injection 4 mg (has no administration in time range)  diltiazem (CARDIZEM) 100 mg in dextrose 5% 100mL (1 mg/mL) infusion (10 mg/hr Intravenous New Bag/Given 03/08/18 1346)  heparin ADULT infusion 100 units/mL (25000 units/24350mL sodium chloride 0.45%) (1,250 Units/hr Intravenous New Bag/Given 03/08/18 1347)  diltiazem (CARDIZEM) 1 mg/mL load via infusion 10 mg (10 mg Intravenous Bolus from Bag 03/08/18 1107)  heparin bolus via infusion 4,450 Units (4,450 Units Intravenous Bolus from Bag 03/08/18 1351)     Initial Impression / Assessment and Plan / ED Course  I have reviewed the triage vital signs and the nursing notes.  Pertinent labs & imaging results that were available during my care of the patient were reviewed by me and considered in my medical decision making (see chart for details).     Patient presenting for evaluation of shortness of breath, dyspnea on exertion for 1 week.  Recent admission for respiratory failure with pulmonary edema.  She is afebrile, persistently tachycardic in the ED.  EMS felt her initial rhythm strip in the field was indicative of supraventricular rhythm so she was given adenosine without improvement.  She was given 2 doses of IV metoprolol 5 mg in the ED with improvement in her rate down to the 110s, and repeat EKG consistent with new onset A. fib with RVR.  Shortly thereafter, her heart rate increased to the 150s and so she was placed on a Cardizem drip with improvement.  Chest x-ray shows marketed cardiomegaly versus pericardial effusion.  Lab work reviewed by me shows no leukocytosis, no anemia, no metabolic derangements.  Her BNP is improved from her recent admission.  Initial troponin is negative.  Low suspicion of MI, PE, dissection, or pneumonia.  Spoke with Dr. Ophelia CharterYates  with [facility] who agrees to assume care of patient and bring her into the hospital for further evaluation and management.  Pending cardiology consultation for further recommendations of patient's new onset A. fib and possible pericardial effusion.  Patient seen and evaluated by Dr. Lockie Molauratolo who agrees with assessment and plan at this  time. Final Clinical Impressions(s) / ED Diagnoses   Final diagnoses:  Atrial fibrillation with RVR [facility])  Cardiomegaly    ED Discharge Orders    None       Jeanie Sewer, PA-C 03/08/18 1430    Virgina Norfolk, DO 03/08/18 1624

## 2018-03-08 NOTE — H&P (Signed)
History and Physical    Joanna [name] MKL:[id number] DOB: 1960-08-18 DOA: 03/08/2018  PCP: Patient, No Pcp Per - has appointment at [facility] on 1/12 Consultants:  None Patient coming from:  Home - lives with brother; [name]: Brother, [phone]  Chief Complaint: SOB  HPI: Joanna [name] is a 58 y.o. female with medical history significant of HTN and CAD presenting with SOB.  This was "the same things that happened when I was in here.  It sort be like when I get up and walking I be short of breath."  She had aching in her back.  Maybe CP 1-2 times.  She could rest good at times but other times she had orthopnea.  Symptoms this time started last week.  She was admitted through 1/6 with HTN emergency requiring intubation for pulmonary edema.  She does not appear to have had outpatient f/u.  ED Course:  New afib with RVR.  Has been feeling bad for a week with SOB, DOE, orthopnea.  Intermittent CP, none now.  EMS with HR 180-190, given adenosine without improvement.  Was given metoprolol x 2 with transient improvement.  Now on Cardizem drip.  ?pericardial effusion.  Cards consult pending.  Review of Systems: As per HPI; otherwise review of systems reviewed and negative.   Ambulatory Status:  Ambulates without assistance  Past Medical History:  Diagnosis Date  . Hypertension   . MI (myocardial infarction) (HCC) 2005  . Pulmonary edema     Past Surgical History:  Procedure Laterality Date  . APPENDECTOMY    . BACK SURGERY      Social History   Socioeconomic History  . Marital status: Single    Spouse name: Not on file  . Number of children: Not on file  . Years of education: Not on file  . Highest education level: Not on file  Occupational History  . Occupation: unemployed  Social Needs  . Financial resource strain: Not on file  . Food insecurity:    Worry: Not on file    Inability: Not on file  . Transportation needs:    Medical: Not on file   Non-medical: Not on file  Tobacco Use  . Smoking status: Former Smoker    Packs/day: 2.00    Years: 30.00    Pack years: 60.00    Last attempt to quit: 02/08/2018    Years since quitting: 0.0  . Smokeless tobacco: Never Used  Substance and Sexual Activity  . Alcohol use: No    Comment: quit drinking year ago  . Drug use: No  . Sexual activity: Not on file  Lifestyle  . Physical activity:    Days per week: Not on file    Minutes per session: Not on file  . Stress: Not on file  Relationships  . Social connections:    Talks on phone: Not on file    Gets together: Not on file    Attends religious service: Not on file    Active member of club or organization: Not on file    Attends meetings of clubs or organizations: Not on file    Relationship status: Not on file  . Intimate partner violence:    Fear of current or ex partner: Not on file    Emotionally abused: Not on file    Physically abused: Not on file    Forced sexual activity: Not on file  Other Topics Concern  . Not on file  Social History Narrative  .  Not on file    Allergies  Allergen Reactions  . Other Other (See Comments)    Spicy foods- Cause a lot of heartburn    Family History  Problem Relation Age of Onset  . Cancer Mother   . Heart disease Father     Prior to Admission medications   Medication Sig Start Date End Date Taking? Authorizing Provider  Aspirin-Salicylamide-Caffeine (BC HEADACHE POWDER PO) Take 1 packet by mouth daily as needed. pain   Yes [provider]  lisinopril-hydrochlorothiazide (PRINZIDE,ZESTORETIC) 10-12.5 MG tablet Take 1 tablet by mouth daily. 02/10/18  Yes Tyrone NineGrunz, Ryan B, MD  metoprolol tartrate (LOPRESSOR) 50 MG tablet Take 1 tablet (50 mg total) by mouth 2 (two) times daily. 02/10/18  Yes Tyrone NineGrunz, Ryan B, MD    Physical Exam: Vitals:   03/08/18 1146 03/08/18 1151 03/08/18 1200 03/08/18 1224  BP: (!) 132/104  (!) 130/109 (!) 156/104  Pulse:    (!) 116  Resp: 18 13 16      Temp:    97.7 F (36.5 C)  TempSrc:    Oral  SpO2:    100%  Weight:    98.4 kg  Height:         . General:  Appears calm and comfortable and is NAD . Eyes:  PERRL, EOMI, normal lids, iris . ENT:  grossly normal hearing, lips & tongue, mmm; poor dentition . Neck:  no LAD, masses or thyromegaly; no carotid bruits . Cardiovascular:  Irregularly irregular with suboptimal rate control, no m/r/g. No LE edema.  Marland Kitchen. Respiratory:   CTA bilaterally with no wheezes/rales/rhonchi.  Normal respiratory effort. . Abdomen:  soft, NT, ND, NABS . Back:   normal alignment, no CVAT . Skin:  no rash or induration seen on limited exam . Musculoskeletal:  grossly normal tone BUE/BLE, good ROM, no bony abnormality . Psychiatric:  grossly normal mood and affect, speech fluent and appropriate, AOx3, very pleasant, suspect underlying chronic cognitive impairment . Neurologic:  CN 2-12 grossly intact, moves all extremities in coordinated fashion, sensation intact    Radiological Exams on Admission: Dg Chest Portable 1 View  Result Date: 03/08/2018 CLINICAL DATA:  Chest pain and shortness of breath today. EXAM: PORTABLE CHEST 1 VIEW COMPARISON:  Single-view of the chest 02/09/2018, 02/08/2018 and 08/29/2005. CT chest 08/29/2005. FINDINGS: There is marked enlargement of the cardiopericardial silhouette. No pulmonary edema. Lungs are clear. No pneumothorax or pleural effusion. IMPRESSION: Markedly enlarged cardiopericardial silhouette consistent with cardiomegaly and/or pericardial effusion. Lungs are clear. Electronically Signed   By: Drusilla Kannerhomas  Dalessio M.D.   On: 03/08/2018 09:43    EKG: Independently reviewed.  Afib with rate 111; LVH; nonspecific ST changes with no evidence of acute ischemia   Labs on Admission: I have personally reviewed the available labs and imaging studies at the time of the admission.  Pertinent labs:   Glucose 130 BNP 270.4 Troponin 0.02 Unremarkable  CBC   Assessment/Plan Principal Problem:   Atrial fibrillation with RVR (HCC) Active Problems:   Tobacco abuse   Acute systolic (congestive) heart failure (HCC)   Accelerated hypertension   Hyperglycemia   Atrial Fibrillation:   -Patient presenting with new-onset afib.  -She was recently hospitalized with flash pulmonary edema and what was thought to be hypertensive emergency, and so it is possible PAF led to prior hospitalization.  -Since the afib onset is unknown, will focus on rate control and searching for the underlying cause at this time. -Will admit to SDU for Diltiazem drip as  per protocol with plan to transition to PO Diltiazem once heart rate is controlled.   -Troponin q6h x 3 total -Will request Echocardiogram for further evaluation  -Will risk stratify with FLP and HgbA1c; will also order TSH/free T4 and UDS.  -Repeat EKG in AM  -ER consulted cardiology; note pending. -CHA2DS2-VASc Score is >2 and so patient will need oral anticoagulation.  -Will use treatment-dose Lovenox for now  Recent hospitalization for CHF -She had not been seeing a doctor or taking medications prior -She was titrated on medications and discharged on lisinopril-HCTZ and Lopressor -She required intubation and mechanical ventilation -Echo was not performed  HTN -Continue Lopressor -Hold Zestoretic  Hyperglycemia -May be stress response -Will follow with fasting AM labs and check A1c  Tobacco dependence -patient reports no further smoking since last hospitalization, which required intubation. -Praise provided.   DVT prophylaxis: Treatment-dose Lovenox Code Status:  Full - confirmed with patient Family Communication: None present Disposition Plan:  Home once clinically improved Consults called: Cardiology  Admission status: It is my clinical opinion that referral for OBSERVATION is reasonable and necessary in this patient based on the above information provided. The aforementioned taken  together are felt to place the patient at high risk for further clinical deterioration. However it is anticipated that the patient may be medically stable for discharge from the hospital within 24 to 48 hours.    Jonah BlueJennifer [place] MD [facility]   How to contact the [facility] Attending or Consulting provider 7A - 7P or covering provider during after hours 7P -7A, for this patient?  1. Check the care team in [facility] and look for a) attending/consulting [facility] provider listed and b) the [facility] team listed 2. Log into [email or web address] and use 's universal password to access. If you do not have the password, please contact the hospital operator. 3. Locate the [facility] provider you are looking for under [facility] and page to a number that you can be directly reached. 4. If you still have difficulty reaching the provider, please page the [facility] (Director on Call) for the Hospitalists listed on amion for assistance.   03/08/2018, 1:09 PM

## 2018-03-08 NOTE — ED Notes (Signed)
Yates at bedside 

## 2018-03-08 NOTE — Progress Notes (Signed)
  Echocardiogram 2D Echocardiogram has been performed.  Joanna [name] 03/08/2018, 3:22 PM

## 2018-03-08 NOTE — ED Notes (Signed)
ED Provider, Cur[place] at bedside.

## 2018-03-08 NOTE — Consult Note (Addendum)
Cardiology Consultation:   Patient ID: [name]; [id number]; July 21, 1960   Admit date: 03/08/2018 Date of Consult: 03/08/2018  Primary Care Provider: Patient, No Pcp Per Primary Cardiologist: No primary care provider on file.  Saw cardiology in 2007, Dr Gala Romney Primary Electrophysiologist:  None   Patient Profile:   Joanna [name] is a 58 y.o. female with a hx of NICM, nl cors 2007, S-D-CHF, HTN, who is being seen today for the evaluation of atrial fib RVR at the request of Dr. Ophelia Charter.  History of Present Illness:   Ms. Kastle was hospitalized 1/4-02/10/2018 for hypertensive emergency and pulmonary edema requiring intubation.  She was intubated for approximately 24 hours.  She had been off her medications for a while.  She was treated for systolic CHF but no echo was performed.  She was encouraged to follow-up as an outpatient, but this had not happened yet.  For approximately a week, she states she has been having problems with increasing dyspnea on exertion and chest pressure.  She describes orthopnea but denies PND.  The chest pain would occur with exertion or when she tried to lay down.  When she tried to lay down, she would feel her heart going very fast.  She feels that the chest pain was a 7/10 at its worst, but would resolve without intervention.  She would sometimes feel her heart go very fast and would get lightheaded.  She was feeling weak with this as well.  Finally, because her symptoms are worsening and the episode of chest pain concerned her, she came to the emergency room.  In the emergency room, she was in rapid atrial fibrillation (new diagnosis) with a heart rate initially 192.  Her blood pressure was elevated at 147/116.  She was started on IV diltiazem and admitted.  She is also on heparin.  She currently feels better than she did, but is still tachycardic and still has orthopnea as she has trouble breathing when she tries to lie back.   Past Medical History:    Diagnosis Date  . Chronic combined systolic (congestive) and diastolic (congestive) heart failure (HCC)   . Goiter   . Hypertension   . NICM (nonischemic cardiomyopathy) (HCC)    a. 08/2005 Echo: EF 30-35%, mod diff HK. Mild to Mod MR. Mildly dil LA; b. 08/2005 Cath: Nl Cors. Elevated CO w/o shunt; c. 09/2007 Echo: EF 45%. Mild to mod MR; c. 03/2015 Echo: EF 45%, global HK. Gr1 DD. Mild MR. Mildly dil LA.    Past Surgical History:  Procedure Laterality Date  . APPENDECTOMY    . BACK SURGERY       Prior to Admission medications   Medication Sig Start Date End Date Taking? Authorizing Provider  Aspirin-Salicylamide-Caffeine (BC HEADACHE POWDER PO) Take 1 packet by mouth daily as needed. pain   Yes [provider]  lisinopril-hydrochlorothiazide (PRINZIDE,ZESTORETIC) 10-12.5 MG tablet Take 1 tablet by mouth daily. 02/10/18  Yes Tyrone Nine, MD  metoprolol tartrate (LOPRESSOR) 50 MG tablet Take 1 tablet (50 mg total) by mouth 2 (two) times daily. 02/10/18  Yes Tyrone Nine, MD    Inpatient Medications: Scheduled Meds: . metoprolol tartrate  50 mg Oral BID   Continuous Infusions: . diltiazem (CARDIZEM) infusion 10 mg/hr (03/08/18 1346)  . heparin 1,250 Units/hr (03/08/18 1347)   PRN Meds: acetaminophen, ondansetron (ZOFRAN) IV  Allergies:    Allergies  Allergen Reactions  . Other Other (See Comments)    Spicy foods- Cause a lot of heartburn  Social History:   Social History   Socioeconomic History  . Marital status: Single    Spouse name: Not on file  . Number of children: Not on file  . Years of education: Not on file  . Highest education level: Not on file  Occupational History  . Occupation: unemployed  Social Needs  . Financial resource strain: Not on file  . Food insecurity:    Worry: Not on file    Inability: Not on file  . Transportation needs:    Medical: Not on file    Non-medical: Not on file  Tobacco Use  . Smoking status: Former Smoker     Packs/day: 2.00    Years: 30.00    Pack years: 60.00    Last attempt to quit: 02/08/2018    Years since quitting: 0.0  . Smokeless tobacco: Never Used  Substance and Sexual Activity  . Alcohol use: No    Comment: quit drinking year ago  . Drug use: No  . Sexual activity: Not on file  Lifestyle  . Physical activity:    Days per week: Not on file    Minutes per session: Not on file  . Stress: Not on file  Relationships  . Social connections:    Talks on phone: Not on file    Gets together: Not on file    Attends religious service: Not on file    Active member of club or organization: Not on file    Attends meetings of clubs or organizations: Not on file    Relationship status: Not on file  . Intimate partner violence:    Fear of current or ex partner: Not on file    Emotionally abused: Not on file    Physically abused: Not on file    Forced sexual activity: Not on file  Other Topics Concern  . Not on file  Social History Narrative  . Not on file    Family History:   Family History  Problem Relation Age of Onset  . Cancer Mother   . Heart disease Father    Family Status:  Family Status  Relation Name Status  . Mother  Deceased  . Father  Deceased    ROS:  Please see the history of present illness.  All other ROS reviewed and negative.     Physical Exam/Data:   Vitals:   03/08/18 1146 03/08/18 1151 03/08/18 1200 03/08/18 1224  BP: (!) 132/104  (!) 130/109 (!) 156/104  Pulse:    (!) 116  Resp: 18 13 16    Temp:    97.7 F (36.5 C)  TempSrc:    Oral  SpO2:    100%  Weight:    98.4 kg  Height:       No intake or output data in the 24 hours ending 03/08/18 1404 Filed Weights   03/08/18 0922 03/08/18 1224  Weight: 97.1 kg 98.4 kg   Body mass index is 31.14 kg/m.  General:  Well nourished, well developed, in no acute distress HEENT: normal Lymph: no adenopathy Neck: JVD 10 CM Endocrine:  No thryomegaly Vascular: No carotid bruits; 4/4 extremity pulses  2+, without bruits  Cardiac:  normal S1, S2; rapid and irregular; no murmur  Lungs: Decreased breath sounds bases bilaterally, no wheezing, rhonchi or rales  Abd: soft, nontender, no hepatomegaly  Ext: no edema Musculoskeletal:  No deformities, BUE and BLE strength normal and equal Skin: warm and dry  Neuro:  CNs 2-12 intact, no focal  abnormalities noted Psych:  Normal affect   EKG:  The EKG was personally reviewed and demonstrates:  03/08/2018, Atrial fib, RVR, HR 111, no acute ischemic changes Telemetry:  Telemetry was personally reviewed and demonstrates:  Atrial fib, RVR  Relevant CV Studies:  ECHO: 04/02/2015 - Left ventricle: The cavity size was normal. Wall thickness was   increased in a pattern of mild LVH. Systolic function was mildly   reduced. The estimated ejection fraction was approximately 45%.   Mild global hypokinesis. Doppler parameters are consistent with   abnormal left ventricular relaxation (grade 1 diastolic   dysfunction). - Mitral valve: There was mild regurgitation. - Left atrium: The atrium was mildly dilated.   CATH: 08/31/2005  ASSESSMENT:  1.  Normal coronary arteries.  2.  Moderate to severe left ventricular dysfunction, suspect hypertensive      cardiomyopathy.  EF 25 to 30%  3.  Resting hypoxemia after a mild amount of sedative.  4.  Elevated cardiac output without any evidence of shunt by oximetry, mild      mitral regurgitation.   PLAN/DISCUSSION:  1.  I suspect this is a hypertensive cardiomyopathy.  Wedge pressure is      still mildly elevated.  We Michalina Calbert focus on titrating her ACE inhibitors,      beta blockers and diuretics.  2.  She Marice Guidone need an ambulatory ABG to evaluate for possible need for home      O2.  3.  She Katrell Milhorn need aggressive control of her blood pressure.  4.  Probable home in the morning, and she Hinata Diener need close follow-up in CHF      clinic.  Laboratory Data:  Chemistry Recent Labs  Lab 03/08/18 0942  NA 144  K 3.4*    CL 111  CO2 22  GLUCOSE 130*  BUN 21*  CREATININE 0.85  CALCIUM 9.2  GFRNONAA >60  GFRAA >60  ANIONGAP 11    Lab Results  Component Value Date   ALT 82 (H) 02/08/2018   AST 139 (H) 02/08/2018   ALKPHOS 93 02/08/2018   BILITOT 0.9 02/08/2018   Hematology Recent Labs  Lab 03/08/18 0942  WBC 6.5  RBC 4.78  HGB 12.9  HCT 43.8  MCV 91.6  MCH 27.0  MCHC 29.5*  RDW 15.0  PLT 185   Cardiac EnzymesNo results for input(s): TROPONINI in the last 168 hours.  Recent Labs  Lab 03/08/18 0934  TROPIPOC 0.02    BNP Recent Labs  Lab 03/08/18 0942  BNP 270.4*    DDimer No results for input(s): DDIMER in the last 168 hours. TSH:  Lab Results  Component Value Date   TSH 0.62 09/15/2007   Lipids: Lab Results  Component Value Date   CHOL 166 09/15/2007   HDL 41.6 09/15/2007   LDLCALC 112 (H) 09/15/2007   TRIG 88 02/08/2018   CHOLHDL 4.0 CALC 09/15/2007   [id number]: Lab Results  Component Value Date   HGBA1C 5.4 03/08/2018   Magnesium: No results found for: MG   Radiology/Studies:  Dg Chest Portable 1 View  Result Date: 03/08/2018 CLINICAL DATA:  Chest pain and shortness of breath today. EXAM: PORTABLE CHEST 1 VIEW COMPARISON:  Single-view of the chest 02/09/2018, 02/08/2018 and 08/29/2005. CT chest 08/29/2005. FINDINGS: There is marked enlargement of the cardiopericardial silhouette. No pulmonary edema. Lungs are clear. No pneumothorax or pleural effusion. IMPRESSION: Markedly enlarged cardiopericardial silhouette consistent with cardiomegaly and/or pericardial effusion. Lungs are clear. Electronically Signed   By: Drusilla Kanner  M.D.   On: 03/08/2018 09:43    Assessment and Plan:   Principal Problem: 1.  Atrial fibrillation with RVR (HCC) - ok to increase Cardizem up to a maximum of 20 mg/h as needed for rate control - TSH was previously normal, recheck -Troponin was initially normal, follow -  CHA2DS2-VASc = 3 (CHF, HTN, female), so anticoagulation is  indicated  2.  Acute on chronic systolic CHF: - Concern for pericardial effusion as her pericardial silhouette is enlarged from previous although has not been normal - She needs targeted heart failure therapy, can go ahead and add carvedilol 6.25 mg twice daily -She has JVD but her chest x-ray is clear and BNP is not very elevated. -Once her echo was reviewed, decide on starting additional therapies. -She was on lisinopril HCTZ, this should be changed to Lasix at appropriate dose and Entresto.  She Sheryle Vice likely need assistance for the [facility]. -If her EF is much lower than previous, she may need repeat ischemic evaluation  Otherwise, per IM Active Problems:   Tobacco abuse   Acute systolic (congestive) heart failure (HCC)   Accelerated hypertension   Hyperglycemia     For questions or updates, please contact [facility] Please consult [email or web address] for contact info under Cardiology/STEMI.   Signed, Theodore Demark, PA-C  03/08/2018 2:04 PM  I have seen and examined this patient with Theodore Demark.  Agree with above, note added to reflect my findings.  On exam, iRRR, no murmurs, lungs clear.  Patient admitted to the hospital with shortness of breath weakness and fatigue.  Found to be in rapid atrial fibrillation with heart rates up to 180 bpm.  Had an echo done that showed an EF of around 20%.  It is certainly possible that this is a tachycardia mediated cardiomyopathy.  She also has severe mitral regurgitation that appears to be functional instead of a primary mitral valve issue.  She is currently on diltiazem drip for rate control.  She is also on heparin.  Would continue these medications.  She may require TEE and cardioversion.  It is unclear how long she is actually been in atrial fibrillation.  Presently her BNP is not super high, but she would likely benefit from diuresis.  Her IVC on her echo is dilated and non-pulsatile.  When she is properly diuresed, would likely plan for her  TEE and cardioversion.  Unfortunately her left atrium is severely dilated.  She Webb Weed likely need some sort of rhythm control as well, likely amiodarone.  Zailyn Rowser M. Williette Loewe MD 03/08/2018 3:27 PM

## 2018-03-08 NOTE — ED Triage Notes (Signed)
Patient arrived via EMS with complaints of shortness of breath with exertion. Pain in right lower back. EMS report SVT at arrival. Patient given 6mg  of Adenosine. Initial heart rate 190- 160 after Adenosine. Patient denies chest pain. Reports feeling SOB for 1 week. Alert and oriented. Heart rate currently 187.

## 2018-03-08 NOTE — Progress Notes (Signed)
Flavia Shipper NP called and updated about troponin level at 0.06 from 0.02. No new orders.

## 2018-03-08 NOTE — ED Provider Notes (Signed)
Medical screening examination/treatment/procedure(s) were conducted as a shared visit with non-physician practitioner(s) and myself.  I personally evaluated the patient during the encounter. Briefly, the patient is a 58 y.o. female is a 58 year old female history of hypertension who presents the ED with shortness of breath.  Patient found to have atrial fibrillation with RVR upon arrival.  Patient was given IV adenosine in the field by EMS that did not affect the heart rate.  EKG upon arrival shows atrial fibrillation with RVR.  Patient was given IV Lopressor with mild improvement but tachycardia returned to over 150.  Patient started on IV diltiazem drip.  She states that she has felt off for the last several days.  Has had some shortness of breath.  Did not realize that her heart rate was fast, she thought her blood pressure is high instead.  Likely has been ongoing for well over 48 hours.  Patient is not anticoagulated.  Has a history of prior heart failure but is not on any fluid medications.  She denies any infectious symptoms.  No chest pain.  Patient has some trace edema on exam.  Some rales on lung exam.  But overall she appears comfortable.  Lab work showed no significant anemia, electrolyte abnormality.  BNP mildly elevated at 270.  Chest x-ray showing signs concerning for volume overload.  Cardiomegaly with possible effusion.  Troponin within normal limits.  Patient has no signs to suggest tamponade.  Heart rate improved while on IV diltiazem drip.  Patient to be admitted to medicine service for further care.  This chart was dictated using voice recognition software.  Despite best efforts to proofread,  errors can occur which can change the documentation meaning.     EKG Interpretation  Date/Time:  Saturday March 08 2018 09:52:03 EST Ventricular Rate:  111 PR Interval:    QRS Duration: 106 QT Interval:  319 QTC Calculation: 434 R Axis:   -14 Text Interpretation:  Atrial fibrillation LVH  with secondary repolarization abnormality Baseline wander in lead(s) II aVR aVF Confirmed by Virgina Norfolk [phone]) on 03/08/2018 10:34:47 AM           Virgina Norfolk, DO 03/08/18 1143

## 2018-03-09 DIAGNOSIS — I5023 Acute on chronic systolic (congestive) heart failure: Secondary | ICD-10-CM | POA: Diagnosis not present

## 2018-03-09 DIAGNOSIS — I4891 Unspecified atrial fibrillation: Secondary | ICD-10-CM | POA: Diagnosis not present

## 2018-03-09 LAB — BASIC METABOLIC PANEL
ANION GAP: 11 (ref 5–15)
BUN: 13 mg/dL (ref 6–20)
CO2: 23 mmol/L (ref 22–32)
Calcium: 9.1 mg/dL (ref 8.9–10.3)
Chloride: 109 mmol/L (ref 98–111)
Creatinine, Ser: 0.81 mg/dL (ref 0.44–1.00)
GFR calc Af Amer: >60 mL/min (ref >60)
GFR calc non Af Amer: >60 mL/min (ref >60)
Glucose, Bld: 98 mg/dL (ref 70–99)
POTASSIUM: 3.1 mmol/L — AB (ref 3.5–5.1)
Sodium: 143 mmol/L (ref 135–145)

## 2018-03-09 LAB — GLUCOSE, CAPILLARY: Glucose-Capillary: 102 mg/dL — ABNORMAL HIGH (ref 70–99)

## 2018-03-09 LAB — LIPID PANEL
Cholesterol: 130 mg/dL (ref 0–200)
HDL: 36 mg/dL — ABNORMAL LOW (ref >40)
LDL Cholesterol: 81 mg/dL (ref 0–99)
TRIGLYCERIDES: 63 mg/dL (ref <150)
Total CHOL/HDL Ratio: 3.6 RATIO
VLDL: 13 mg/dL (ref 0–40)

## 2018-03-09 LAB — CBC
HCT: 41.4 % (ref 36.0–46.0)
HEMOGLOBIN: 13 g/dL (ref 12.0–15.0)
MCH: 27.7 pg (ref 26.0–34.0)
MCHC: 31.4 g/dL (ref 30.0–36.0)
MCV: 88.3 fL (ref 80.0–100.0)
Platelets: 244 10*3/uL (ref 150–400)
RBC: 4.69 MIL/uL (ref 3.87–5.11)
RDW: 14.6 % (ref 11.5–15.5)
WBC: 5.7 10*3/uL (ref 4.0–10.5)
nRBC: 0 % (ref 0.0–0.2)

## 2018-03-09 LAB — HEPARIN LEVEL (UNFRACTIONATED): Heparin Unfractionated: 0.61 IU/mL (ref 0.30–0.70)

## 2018-03-09 LAB — MAGNESIUM: Magnesium: 1.6 mg/dL — ABNORMAL LOW (ref 1.7–2.4)

## 2018-03-09 MED ORDER — LOSARTAN POTASSIUM 25 MG PO TABS
25.0000 mg | ORAL_TABLET | Freq: Every day | ORAL | Status: DC
  Administered 2018-03-09 – 2018-03-14 (×6): 25 mg via ORAL
  Filled 2018-03-09 (×6): qty 1

## 2018-03-09 MED ORDER — METOPROLOL TARTRATE 25 MG PO TABS: 25.0000 mg | ORAL_TABLET | Freq: Four times a day (QID) | ORAL | Status: DC

## 2018-03-09 MED ORDER — FUROSEMIDE 10 MG/ML IJ SOLN
20.0000 mg | Freq: Two times a day (BID) | INTRAMUSCULAR | Status: DC
  Administered 2018-03-09: 20 mg via INTRAVENOUS
  Filled 2018-03-09: qty 2

## 2018-03-09 MED ORDER — FUROSEMIDE 10 MG/ML IJ SOLN
20.0000 mg | Freq: Two times a day (BID) | INTRAMUSCULAR | Status: DC
  Administered 2018-03-09 – 2018-03-11 (×4): 20 mg via INTRAVENOUS
  Filled 2018-03-09 (×4): qty 2

## 2018-03-09 MED ORDER — FUROSEMIDE 40 MG PO TABS: 40.0000 mg | ORAL_TABLET | Freq: Every day | ORAL | Status: DC

## 2018-03-09 MED ORDER — POTASSIUM CHLORIDE CRYS ER 20 MEQ PO TBCR
30.0000 meq | EXTENDED_RELEASE_TABLET | ORAL | Status: AC
  Administered 2018-03-09 (×3): 30 meq via ORAL
  Filled 2018-03-09 (×3): qty 1

## 2018-03-09 MED ORDER — METOPROLOL TARTRATE 25 MG PO TABS
25.0000 mg | ORAL_TABLET | Freq: Four times a day (QID) | ORAL | Status: DC
  Administered 2018-03-09 – 2018-03-10 (×5): 25 mg via ORAL
  Filled 2018-03-09 (×5): qty 1

## 2018-03-09 MED ORDER — MAGNESIUM SULFATE 4 GM/100ML IV SOLN
4.0000 g | Freq: Once | INTRAVENOUS | Status: AC
  Administered 2018-03-09: 4 g via INTRAVENOUS
  Filled 2018-03-09: qty 100

## 2018-03-09 MED ORDER — POTASSIUM CHLORIDE CRYS ER 20 MEQ PO TBCR
40.0000 meq | EXTENDED_RELEASE_TABLET | Freq: Once | ORAL | Status: AC
  Administered 2018-03-09: 40 meq via ORAL
  Filled 2018-03-09: qty 2

## 2018-03-09 NOTE — Progress Notes (Signed)
Text paged [facility], at patient's request, D/T not being able to sleep for a few days and wants a sleeping aid, awaiting either a call back or orders. Patient is on a CArdizem drip at 10mg /10cc per hr to keep HR 100 or less, will continue to monitor.

## 2018-03-09 NOTE — Progress Notes (Signed)
ANTICOAGULATION CONSULT NOTE  Pharmacy Consult for heparin Indication: atrial fibrillation  Allergies  Allergen Reactions  . Other Other (See Comments)    Spicy foods- Cause a lot of heartburn    Patient Measurements: Height: 5\' 10"  (177.8 cm) Weight: 213 lb 1.6 oz (96.7 kg) IBW/kg (Calculated) : 68.5 Heparin Dosing Weight: 89.1 kg  Vital Signs: Temp: 97.9 F (36.6 C) (02/02 0407) Temp Source: Oral (02/02 0407) BP: 127/108 (02/02 0826) Pulse Rate: 109 (02/02 0826)  Labs: Recent Labs    03/08/18 0942 03/08/18 1527 03/08/18 1806 03/08/18 2127 03/09/18 0407  HGB 12.9  --   --   --  13.0  HCT 43.8  --   --   --  41.4  PLT 185  --   --   --  244  HEPARINUNFRC  --   --  0.68  --  0.61  CREATININE 0.85  --   --   --  0.81  TROPONINI  --  0.06*  --  0.06*  --     Estimated Creatinine Clearance: 95.4 mL/min (by C-G formula based on SCr of 0.81 mg/dL).   Medical History: Past Medical History:  Diagnosis Date  . Chronic combined systolic (congestive) and diastolic (congestive) heart failure (HCC)   . Goiter   . Hypertension   . NICM (nonischemic cardiomyopathy) (HCC)    a. 08/2005 Echo: EF 30-35%, mod diff HK. Mild to Mod MR. Mildly dil LA; b. 08/2005 Cath: Nl Cors. Elevated CO w/o shunt; c. 09/2007 Echo: EF 45%. Mild to mod MR; c. 03/2015 Echo: EF 45%, global HK. Gr1 DD. Mild MR. Mildly dil LA.    Medications:  Scheduled:  . furosemide  20 mg Intravenous BID  . losartan  25 mg Oral Daily  . metoprolol tartrate  25 mg Oral Q6H  . potassium chloride  30 mEq Oral Q2H    Assessment: 57 yof presenting with SOB on exertion, found to be in Afib RVR. No anticoag PTA.   Confirmatory heparin level came back therapeutic at 0.61, on 1250 units/hr. Hgb 13, plt 244. No s/sx of bleeding. No infusion issues.  Goal of Therapy:  Heparin level 0.3-0.7 units/ml Monitor platelets by anticoagulation protocol: Yes  Plan:  Continue heparin infusion at 1250 units/hr Check anti-Xa  level daily while on heparin Continue to monitor H&H and platelets  Sherron Monday, PharmD, BCCCP Clinical Pharmacist  Pager: [phone] Phone: [phone] 03/09/2018,11:02 AM

## 2018-03-09 NOTE — Progress Notes (Addendum)
Progress Note  Patient Name: Joanna [name] Date of Encounter: 03/09/2018  Primary Cardiologist: No primary care provider on file.   Subjective   Currently feeling improved.  Did have some palpitations overnight, though no shortness of breath.  Inpatient Medications    Scheduled Meds: . carvedilol  6.25 mg Oral BID WC   Continuous Infusions: . diltiazem (CARDIZEM) infusion 10 mg/hr (03/09/18 0600)  . heparin 1,250 Units/hr (03/09/18 0552)   PRN Meds: acetaminophen, ondansetron (ZOFRAN) IV   Vital Signs    Vitals:   03/09/18 0033 03/09/18 0300 03/09/18 0407 03/09/18 0826  BP: 119/79  128/89 (!) 127/108  Pulse: 67  85 (!) 109  Resp: 19  18   Temp: (!) 97.5 F (36.4 C)  97.9 F (36.6 C)   TempSrc: Oral  Oral   SpO2: 100%     Weight:  96.7 kg    Height:        Intake/Output Summary (Last 24 hours) at 03/09/2018 0846 Last data filed at 03/09/2018 0600 Gross per 24 hour  Intake 846.13 ml  Output 200 ml  Net 646.13 ml   Last 3 Weights 03/09/2018 03/08/2018 03/08/2018  Weight (lbs) 213 lb 1.6 oz 217 lb 214 lb  Weight (kg) 96.662 kg 98.431 kg 97.07 kg      Telemetry    Atrial fibrillation- Personally Reviewed  ECG    None new- Personally Reviewed  Physical Exam   GEN: No acute distress.   Neck: No JVD Cardiac:  Irregular, tachycardic, no murmurs, rubs, or gallops.  Respiratory: Clear to auscultation bilaterally. GI: Soft, nontender, non-distended  MS: No edema; No deformity. Neuro:  Nonfocal  Psych: Normal affect   Labs    Chemistry Recent Labs  Lab 03/08/18 0942 03/09/18 0407  NA 144 143  K 3.4* 3.1*  CL 111 109  CO2 22 23  GLUCOSE 130* 98  BUN 21* 13  CREATININE 0.85 0.81  CALCIUM 9.2 9.1  GFRNONAA >60 >60  GFRAA >60 >60  ANIONGAP 11 11     Hematology Recent Labs  Lab 03/08/18 0942 03/09/18 0407  WBC 6.5 5.7  RBC 4.78 4.69  HGB 12.9 13.0  HCT 43.8 41.4  MCV 91.6 88.3  MCH 27.0 27.7  MCHC 29.5* 31.4  RDW 15.0 14.6  PLT 185 244     Cardiac Enzymes Recent Labs  Lab 03/08/18 1527 03/08/18 2127  TROPONINI 0.06* 0.06*    Recent Labs  Lab 03/08/18 0934  TROPIPOC 0.02     BNP Recent Labs  Lab 03/08/18 0942  BNP 270.4*     DDimer No results for input(s): DDIMER in the last 168 hours.   Radiology    Dg Chest Portable 1 View  Result Date: 03/08/2018 CLINICAL DATA:  Chest pain and shortness of breath today. EXAM: PORTABLE CHEST 1 VIEW COMPARISON:  Single-view of the chest 02/09/2018, 02/08/2018 and 08/29/2005. CT chest 08/29/2005. FINDINGS: There is marked enlargement of the cardiopericardial silhouette. No pulmonary edema. Lungs are clear. No pneumothorax or pleural effusion. IMPRESSION: Markedly enlarged cardiopericardial silhouette consistent with cardiomegaly and/or pericardial effusion. Lungs are clear. Electronically Signed   By: Drusilla Kanner M.D.   On: 03/08/2018 09:43    Cardiac Studies   TTE 03/08/17  1. The left ventricle has severely reduced systolic function of 20-25%. The cavity size is severely increased. There is no left ventricular wall thickness. Echo evidence of pseudonormal diastolic filling patterns.  2. Grade 2 diastolic dysfunction.  3. The right ventricle is in  size. There is normal systolic function. Right ventricular systolic pressure is severely elevated with an estimated pressure of 59.9 mmHg.  4. Massively dilated left atrial size.  5. Normal right atrial size.  6. Trivial pericardial effusion, as described above.  7. The mitral valve normal in structure. Regurgitation is severe by color flow Doppler.  8. Likely severe mitral regurgitation directed posteriorly and towards the lateral wall. Coanda effect noted. Likely functional mitral regurgitation due to dilated left ventricle.  9. Normal tricuspid valve. 10. Tricuspid regurgitation moderate. 11. Pulmonic valve regurgitation is mild by color flow Doppler. 12. The inferior vena cava was dilated in size with <50% respiratory  variablity. 13. No atrial level shunt detected by color flow Doppler.  Patient Profile     58 y.o. female the hospital with palpitations, found to have atrial fibrillation and systolic heart failure  Assessment & Plan    1.  Atrial fibrillation: Is likely persistent, though she is unclear as to when this started.  She is currently on both diltiazem and a heparin drip.  I do think that she Costella Schwarz likely need rhythm control with likely amiodarone.  She Lamin Chandley also likely need a TEE and cardioversion, though I do think that she needs diuresis prior to that.  She is certainly volume overloaded based on her echo.  We Makelle Marrone continue with diuresis which Raniah Karan help her stay in rhythm.  Unfortunately her left atrium is severely dilated which Lacie Landry likely make sinus rhythm difficult.  2.  Chronic systolic heart failure: Ejection fraction has reduced to 20 to 25% from in the 40s.  This is likely due to a tachycardia mediated cardiomyopathy.  We Ilse Billman continue to treat her heart rate and rhythm.  I Page Pucciarelli start her on medications for heart failure today with metoprolol and losartan.    3.  Mitral regurgitation, severe: Likely functional due to dilated left ventricle and left atrium.  Maykayla Highley likely improve with diuresis.  4.  Hypokalemia: Likely due to diuresis.  We Mauriah Mcmillen plan to replete potassium today.  For questions or updates, please contact [facility] Please consult [email or web address] for contact info under        Signed, Berley Gambrell Jorja Loa, MD  03/09/2018, 8:46 AM

## 2018-03-09 NOTE — Progress Notes (Signed)
Text paged [facility] with low potassium level of 3.1 with occasional PVC and a couple 3 beat runs of V-Tach throughout the shift, awaiting further orders, VSS and pt is sleeping with no S/S of distress at this time.

## 2018-03-09 NOTE — Progress Notes (Signed)
PROGRESS NOTE    Conley Poitevint  ERX:[id number] DOB: Mar 30, 1960 DOA: 03/08/2018 PCP: Patient, No Pcp Per   Brief Narrative: Patient is a 58 year old female with past medical history of hypertension, coronary disease who presents with shortness of breath and tachycardia.  She was just discharged from here after being managed for hypertensive emergency/pulmonary edema ,was intubated and successfully extubated.  When she presented this time she was found to have new A. fib with RVR.  She was complaining of shortness of breath for a week, dyspnea on exertion, orthopnea.  Cardiology consulted.  Assessment & Plan:   Principal Problem:   Atrial fibrillation with RVR (HCC) Active Problems:   Tobacco abuse   Acute systolic (congestive) heart failure (HCC)   Accelerated hypertension   Hyperglycemia  A. fib with RVR: Still on A. fib with RVR this morning.  Currently on Cardizem drip.  Started on heparin drip.CHA2DS2-VASc Score is 3.  Cardiology planning for TEE/cardioversion.  Acute on chronic combined CHF: Was not  Following -up with cardiology.  Echocardiogram done here showed ejection fraction of 20 to 25% decreased from 40 to 45% as per the echo in 2017.  Also showed grade 2 diastolic dysfunction.  Slightly tachycardia induced myopathy. Patient does not look volume overloaded. She does not peripheral edema or JVD .  Lungs clear. Started on ARB and beta-blocker.  Severe mitral regurgitation/massively dilated left atrial size: Management as per cardiology.  Continue Lasix  Hypertension: Recently admitted for hypertensive urgency and had to be intubated for severe pulmonary edema.  Continue to monitor blood pressure.  Continue current medications.  Smoker: Counseling provided  Hypokalemia: Supplemented with potassium       DVT prophylaxis: Heparin IV Code Status: Full Family Communication: None present at the bedside Disposition Plan: Home after full work-up   Consultants:  Cardiology  Procedures: Echo  Antimicrobials:  Anti-infectives (From admission, onward)   None      Subjective: Patient seen and examined the bedside this morning.  Still on A. fib with RVR.  Does not complain of any chest pain or shortness of breath while at rest.  Hemodynamically stable.  Objective: Vitals:   03/09/18 0033 03/09/18 0300 03/09/18 0407 03/09/18 0826  BP: 119/79  128/89 (!) 127/108  Pulse: 67  85 (!) 109  Resp: 19  18   Temp: (!) 97.5 F (36.4 C)  97.9 F (36.6 C)   TempSrc: Oral  Oral   SpO2: 100%     Weight:  96.7 kg    Height:        Intake/Output Summary (Last 24 hours) at 03/09/2018 1105 Last data filed at 03/09/2018 0600 Gross per 24 hour  Intake 846.13 ml  Output 200 ml  Net 646.13 ml   Filed Weights   03/08/18 0922 03/08/18 1224 03/09/18 0300  Weight: 97.1 kg 98.4 kg 96.7 kg    Examination:  General exam: Appears calm and comfortable ,Not in distress,obese HEENT:PERRL,Oral mucosa moist, Ear/Nose normal on gross exam Respiratory system: Bilateral equal air entry, normal vesicular breath sounds, no wheezes or crackles  Cardiovascular system: Afib with RVR. No JVD, murmurs, rubs, gallops or clicks. No pedal edema. Gastrointestinal system: Abdomen is nondistended, soft and nontender. No organomegaly or masses felt. Normal bowel sounds heard. Central nervous system: Alert and oriented. No focal neurological deficits. Extremities: No edema, no clubbing ,no cyanosis, distal peripheral pulses palpable. Skin: No rashes, lesions or ulcers,no icterus ,no pallor MSK: Normal muscle bulk,tone ,power Psychiatry: Judgement and insight appear normal. Mood &  affect appropriate.     Data Reviewed: I have personally reviewed following labs and imaging studies  CBC: Recent Labs  Lab 03/08/18 0942 03/09/18 0407  WBC 6.5 5.7  NEUTROABS 3.8  --   HGB 12.9 13.0  HCT 43.8 41.4  MCV 91.6 88.3  PLT 185 244   Basic Metabolic Panel: Recent Labs  Lab  03/08/18 0942 03/09/18 0407  NA 144 143  K 3.4* 3.1*  CL 111 109  CO2 22 23  GLUCOSE 130* 98  BUN 21* 13  CREATININE 0.85 0.81  CALCIUM 9.2 9.1   GFR: Estimated Creatinine Clearance: 95.4 mL/min (by C-G formula based on SCr of 0.81 mg/dL). Liver Function Tests: No results for input(s): AST, ALT, ALKPHOS, BILITOT, PROT, ALBUMIN in the last 168 hours. No results for input(s): LIPASE, AMYLASE in the last 168 hours. No results for input(s): AMMONIA in the last 168 hours. Coagulation Profile: No results for input(s): INR, PROTIME in the last 168 hours. Cardiac Enzymes: Recent Labs  Lab 03/08/18 1527 03/08/18 2127  TROPONINI 0.06* 0.06*   BNP (last 3 results) No results for input(s): PROBNP in the last 8760 hours. HbA1C: Recent Labs    03/08/18 1256  HGBA1C 5.4   CBG: No results for input(s): GLUCAP in the last 168 hours. Lipid Profile: Recent Labs    03/09/18 0407  CHOL 130  HDL 36*  LDLCALC 81  TRIG 63  CHOLHDL 3.6   Thyroid Function Tests: Recent Labs    03/08/18 1256  TSH 1.988  FREET4 0.90   Anemia Panel: No results for input(s): VITAMINB12, FOLATE, FERRITIN, TIBC, IRON, RETICCTPCT in the last 72 hours. Sepsis Labs: No results for input(s): PROCALCITON, LATICACIDVEN in the last 168 hours.  No results found for this or any previous visit (from the past 240 hour(s)).       Radiology Studies: Dg Chest Portable 1 View  Result Date: 03/08/2018 CLINICAL DATA:  Chest pain and shortness of breath today. EXAM: PORTABLE CHEST 1 VIEW COMPARISON:  Single-view of the chest 02/09/2018, 02/08/2018 and 08/29/2005. CT chest 08/29/2005. FINDINGS: There is marked enlargement of the cardiopericardial silhouette. No pulmonary edema. Lungs are clear. No pneumothorax or pleural effusion. IMPRESSION: Markedly enlarged cardiopericardial silhouette consistent with cardiomegaly and/or pericardial effusion. Lungs are clear. Electronically Signed   By: Drusilla Kanner M.D.   On:  03/08/2018 09:43        Scheduled Meds: . [START ON 03/10/2018] furosemide  40 mg Oral Daily  . losartan  25 mg Oral Daily  . metoprolol tartrate  25 mg Oral Q6H  . potassium chloride  30 mEq Oral Q2H   Continuous Infusions: . diltiazem (CARDIZEM) infusion 10 mg/hr (03/09/18 1015)  . heparin 1,250 Units/hr (03/09/18 0552)     LOS: 0 days    Time spent:35 mins. More than 50% of that time was spent in counseling and/or coordination of care.      Burnadette Pop, MD [facility] Pager [phone]  If 7PM-7AM, please contact night-coverage [email or web address] Password [email or web address] 03/09/2018, 11:05 AM

## 2018-03-09 NOTE — Progress Notes (Signed)
Patient's HR dropped into the low 40's for a short period but she is sleeping with B/P WNL and CArdizem drip was turned down at 2PM to 5 mg/5cc per hour, will continue to monitor.

## 2018-03-10 ENCOUNTER — Encounter (HOSPITAL_COMMUNITY): Payer: Self-pay | Admitting: General Practice

## 2018-03-10 ENCOUNTER — Other Ambulatory Visit: Payer: Self-pay

## 2018-03-10 DIAGNOSIS — I340 Nonrheumatic mitral (valve) insufficiency: Secondary | ICD-10-CM | POA: Diagnosis present

## 2018-03-10 DIAGNOSIS — I5043 Acute on chronic combined systolic (congestive) and diastolic (congestive) heart failure: Secondary | ICD-10-CM | POA: Diagnosis present

## 2018-03-10 DIAGNOSIS — I428 Other cardiomyopathies: Secondary | ICD-10-CM | POA: Diagnosis present

## 2018-03-10 DIAGNOSIS — I313 Pericardial effusion (noninflammatory): Secondary | ICD-10-CM | POA: Diagnosis not present

## 2018-03-10 DIAGNOSIS — I5082 Biventricular heart failure: Secondary | ICD-10-CM | POA: Diagnosis not present

## 2018-03-10 DIAGNOSIS — Z7982 Long term (current) use of aspirin: Secondary | ICD-10-CM | POA: Diagnosis not present

## 2018-03-10 DIAGNOSIS — Z8249 Family history of ischemic heart disease and other diseases of the circulatory system: Secondary | ICD-10-CM | POA: Diagnosis not present

## 2018-03-10 DIAGNOSIS — I4891 Unspecified atrial fibrillation: Secondary | ICD-10-CM | POA: Diagnosis not present

## 2018-03-10 DIAGNOSIS — I110 Hypertensive heart disease with heart failure: Secondary | ICD-10-CM | POA: Diagnosis present

## 2018-03-10 DIAGNOSIS — Z91018 Allergy to other foods: Secondary | ICD-10-CM | POA: Diagnosis not present

## 2018-03-10 DIAGNOSIS — I517 Cardiomegaly: Secondary | ICD-10-CM | POA: Diagnosis not present

## 2018-03-10 DIAGNOSIS — I480 Paroxysmal atrial fibrillation: Secondary | ICD-10-CM | POA: Diagnosis present

## 2018-03-10 DIAGNOSIS — R739 Hyperglycemia, unspecified: Secondary | ICD-10-CM | POA: Diagnosis present

## 2018-03-10 DIAGNOSIS — E876 Hypokalemia: Secondary | ICD-10-CM | POA: Diagnosis present

## 2018-03-10 DIAGNOSIS — Z9119 Patient's noncompliance with other medical treatment and regimen: Secondary | ICD-10-CM | POA: Diagnosis not present

## 2018-03-10 DIAGNOSIS — I472 Ventricular tachycardia: Secondary | ICD-10-CM | POA: Diagnosis present

## 2018-03-10 DIAGNOSIS — Z87891 Personal history of nicotine dependence: Secondary | ICD-10-CM | POA: Diagnosis not present

## 2018-03-10 DIAGNOSIS — X58XXXA Exposure to other specified factors, initial encounter: Secondary | ICD-10-CM | POA: Diagnosis not present

## 2018-03-10 DIAGNOSIS — J449 Chronic obstructive pulmonary disease, unspecified: Secondary | ICD-10-CM | POA: Diagnosis present

## 2018-03-10 DIAGNOSIS — Z79899 Other long term (current) drug therapy: Secondary | ICD-10-CM | POA: Diagnosis not present

## 2018-03-10 DIAGNOSIS — T502X5A Adverse effect of carbonic-anhydrase inhibitors, benzothiadiazides and other diuretics, initial encounter: Secondary | ICD-10-CM | POA: Diagnosis not present

## 2018-03-10 DIAGNOSIS — I5021 Acute systolic (congestive) heart failure: Secondary | ICD-10-CM | POA: Diagnosis not present

## 2018-03-10 DIAGNOSIS — I252 Old myocardial infarction: Secondary | ICD-10-CM | POA: Diagnosis not present

## 2018-03-10 DIAGNOSIS — I160 Hypertensive urgency: Secondary | ICD-10-CM | POA: Diagnosis present

## 2018-03-10 DIAGNOSIS — I10 Essential (primary) hypertension: Secondary | ICD-10-CM | POA: Diagnosis not present

## 2018-03-10 DIAGNOSIS — I272 Pulmonary hypertension, unspecified: Secondary | ICD-10-CM | POA: Diagnosis present

## 2018-03-10 LAB — GLUCOSE, CAPILLARY
Glucose-Capillary: 95 mg/dL (ref 70–99)
Glucose-Capillary: 96 mg/dL (ref 70–99)
Glucose-Capillary: 88 mg/dL (ref 70–99)
Glucose-Capillary: 108 mg/dL — ABNORMAL HIGH (ref 70–99)

## 2018-03-10 LAB — CBC
HCT: 44.1 % (ref 36.0–46.0)
Hemoglobin: 13.3 g/dL (ref 12.0–15.0)
MCH: 26.9 pg (ref 26.0–34.0)
MCHC: 30.2 g/dL (ref 30.0–36.0)
MCV: 89.3 fL (ref 80.0–100.0)
NRBC: 0 % (ref 0.0–0.2)
Platelets: 239 10*3/uL (ref 150–400)
RBC: 4.94 MIL/uL (ref 3.87–5.11)
RDW: 14.6 % (ref 11.5–15.5)
WBC: 6.5 10*3/uL (ref 4.0–10.5)

## 2018-03-10 LAB — BASIC METABOLIC PANEL
Anion gap: 10 (ref 5–15)
BUN: 14 mg/dL (ref 6–20)
CO2: 22 mmol/L (ref 22–32)
Calcium: 9 mg/dL (ref 8.9–10.3)
Chloride: 111 mmol/L (ref 98–111)
Creatinine, Ser: 0.74 mg/dL (ref 0.44–1.00)
GFR calc Af Amer: >60 mL/min (ref >60)
GFR calc non Af Amer: >60 mL/min (ref >60)
Glucose, Bld: 102 mg/dL — ABNORMAL HIGH (ref 70–99)
Potassium: 3.9 mmol/L (ref 3.5–5.1)
SODIUM: 143 mmol/L (ref 135–145)

## 2018-03-10 LAB — HEPARIN LEVEL (UNFRACTIONATED): Heparin Unfractionated: 0.55 IU/mL (ref 0.30–0.70)

## 2018-03-10 MED ORDER — PANTOPRAZOLE SODIUM 40 MG PO TBEC
40.0000 mg | DELAYED_RELEASE_TABLET | Freq: Every day | ORAL | Status: DC
  Administered 2018-03-10 – 2018-03-15 (×6): 40 mg via ORAL
  Filled 2018-03-10 (×7): qty 1

## 2018-03-10 MED ORDER — METOPROLOL TARTRATE 50 MG PO TABS
75.0000 mg | ORAL_TABLET | Freq: Two times a day (BID) | ORAL | Status: DC
  Administered 2018-03-10 – 2018-03-11 (×2): 75 mg via ORAL
  Filled 2018-03-10 (×2): qty 1

## 2018-03-10 MED ORDER — GUAIFENESIN ER 600 MG PO TB12
600.0000 mg | ORAL_TABLET | Freq: Two times a day (BID) | ORAL | Status: DC
  Administered 2018-03-10 – 2018-03-15 (×11): 600 mg via ORAL
  Filled 2018-03-10 (×12): qty 1

## 2018-03-10 MED ORDER — APIXABAN 5 MG PO TABS
5.0000 mg | ORAL_TABLET | Freq: Two times a day (BID) | ORAL | Status: DC
  Administered 2018-03-10 – 2018-03-15 (×10): 5 mg via ORAL
  Filled 2018-03-10 (×11): qty 1

## 2018-03-10 MED ORDER — HEPARIN (PORCINE) 25000 UT/250ML-% IV SOLN: 1250.0000 [IU]/h | INTRAVENOUS | Status: DC

## 2018-03-10 NOTE — Progress Notes (Signed)
Benefit check in progress for Entresto 49/51 mg twice daily; B [employer] [phone]

## 2018-03-10 NOTE — Progress Notes (Signed)
ANTICOAGULATION CONSULT NOTE  Pharmacy Consult:  Heparin >> Apixaban Indication: atrial fibrillation  Allergies  Allergen Reactions  . Other Other (See Comments)    Spicy foods- Cause a lot of heartburn    Patient Measurements: Height: 5\' 10"  (177.8 cm) Weight: 210 lb 14.4 oz (95.7 kg) IBW/kg (Calculated) : 68.5 Heparin Dosing Weight: 89 kg  Vital Signs: Temp: 98.7 F (37.1 C) (02/03 1223) Temp Source: Oral (02/03 1223) BP: 114/84 (02/03 1223) Pulse Rate: 111 (02/03 1223)  Labs: Recent Labs    03/08/18 0942 03/08/18 1527 03/08/18 1806 03/08/18 2127 03/09/18 0407 03/10/18 0402  HGB 12.9  --   --   --  13.0 13.3  HCT 43.8  --   --   --  41.4 44.1  PLT 185  --   --   --  244 239  HEPARINUNFRC  --   --  0.68  --  0.61 0.55  CREATININE 0.85  --   --   --  0.81 0.74  TROPONINI  --  0.06*  --  0.06*  --   --     Estimated Creatinine Clearance: 96.1 mL/min (by C-G formula based on SCr of 0.74 mg/dL).   Assessment: 47 YOF presented with SOB on exertion, found to be in Afib RVR and started on IV heparin.  Now plans are to transition to Apixaban this afternoon/evening.   With start 5 mg bid dosing given age<80, wt>60 kg, SCr<1.5.   Goal of Therapy:  Heparin level 0.3-0.7 units/ml Monitor platelets by anticoagulation protocol: Yes  Plan:  - D/c Heparin drip at 1759 - Start Apixaban 5 mg bid starting at 1800 this evening - Will plan to educate prior to discharge - Will sign off of the consult and monitor peripherally for any necessary dose adjustments and/or signs of bleeding  Thank you for allowing pharmacy to be a part of this patient's care.  Georgina Pillion, PharmD, BCPS Clinical Pharmacist Clinical phone for 03/10/2018: [phone] 03/10/2018 3:35 PM   **Pharmacist phone directory can now be found on [email or web address] ([email or web address]).  Listed under [facility].

## 2018-03-10 NOTE — Progress Notes (Signed)
PROGRESS NOTE    Joanna [name]  EZM:[id number] DOB: Jul 31, 1960 DOA: 03/08/2018 PCP: Patient, No Pcp Per   Brief Narrative: Patient is a 58 year old female with past medical history of hypertension, coronary disease who presents with shortness of breath and tachycardia.  She was just discharged from here after being managed for hypertensive emergency/pulmonary edema ,was intubated and successfully extubated.  When she presented this time she was found to have new A. fib with RVR.  She was complaining of shortness of breath for a week, dyspnea on exertion, orthopnea.  Cardiology consulted.  Currently being managed for A. fib with RVR, severe systolic dysfunction.  Assessment & Plan:   Principal Problem:   Atrial fibrillation with RVR (HCC) Active Problems:   Tobacco abuse   Acute systolic (congestive) heart failure (HCC)   Accelerated hypertension   Hyperglycemia  A. fib with RVR: Still on A. fib with RVR this morning.  Currently on Cardizem drip.  Started on heparin drip.CHA2DS2-VASc Score is 3.  Cardiology planning for TEE/cardioversion.  Acute on chronic combined CHF: Was not  Following -up with cardiology.  Echocardiogram done here showed ejection fraction of 20 to 25% decreased from 40 to 45% as per the echo in 2017.  Also showed grade 2 diastolic dysfunction.  Likely  tachycardia induced myopathy. Patient does not look volume overloaded. She does not peripheral edema or JVD .  Lungs clear. Started on ARB and beta-blocker.  Severe mitral regurgitation/massively dilated left atrial size: Management as per cardiology.  Continue Lasix  Hypertension: Recently admitted for hypertensive urgency and had to be intubated for severe pulmonary edema.  Continue to monitor blood pressure.  Continue current medications.  Smoker: Counseling provided  Hypokalemia: Supplemented with potassium       DVT prophylaxis: Heparin IV Code Status: Full Family Communication: None present at the  bedside Disposition Plan: Home after full work-up   Consultants: Cardiology  Procedures: Echo  Antimicrobials:  Anti-infectives (From admission, onward)   None      Subjective: Patient seen and examined the bedside this morning.  Remains comfortable.  Still in A. fib with RVR.  Complains of some throat irritation. Objective: Vitals:   03/09/18 2106 03/10/18 0104 03/10/18 0550 03/10/18 0827  BP: (!) 123/99 (!) 116/92 (!) 128/104 (!) 126/93  Pulse: 99 70 79 96  Resp:      Temp: 98 F (36.7 C)  (!) 97.5 F (36.4 C)   TempSrc: Oral  Oral   SpO2: 98%  94%   Weight:   95.7 kg   Height:        Intake/Output Summary (Last 24 hours) at 03/10/2018 1125 Last data filed at 03/10/2018 0600 Gross per 24 hour  Intake 517.52 ml  Output 300 ml  Net 217.52 ml   Filed Weights   03/08/18 1224 03/09/18 0300 03/10/18 0550  Weight: 98.4 kg 96.7 kg 95.7 kg    Examination:   General exam: Appears calm and comfortable ,Not in distress,average built HEENT:PERRL,Oral mucosa moist, Ear/Nose normal on gross exam Respiratory system: Bilateral equal air entry, normal vesicular breath sounds, no wheezes or crackles  Cardiovascular system: Afib with RVR. No JVD, murmurs, rubs, gallops or clicks. Gastrointestinal system: Abdomen is nondistended, soft and nontender. No organomegaly or masses felt. Normal bowel sounds heard. Central nervous system: Alert and oriented. No focal neurological deficits. Extremities: No edema, no clubbing ,no cyanosis, distal peripheral pulses palpable. Skin: No rashes, lesions or ulcers,no icterus ,no pallor MSK: Normal muscle bulk,tone ,power Psychiatry: Judgement and  insight appear normal. Mood & affect appropriate.     Data Reviewed: I have personally reviewed following labs and imaging studies  CBC: Recent Labs  Lab 03/08/18 0942 03/09/18 0407 03/10/18 0402  WBC 6.5 5.7 6.5  NEUTROABS 3.8  --   --   HGB 12.9 13.0 13.3  HCT 43.8 41.4 44.1  MCV 91.6 88.3  89.3  PLT 185 244 239   Basic Metabolic Panel: Recent Labs  Lab 03/08/18 0942 03/09/18 0407 03/10/18 0402  NA 144 143 143  K 3.4* 3.1* 3.9  CL 111 109 111  CO2 22 23 22   GLUCOSE 130* 98 102*  BUN 21* 13 14  CREATININE 0.85 0.81 0.74  CALCIUM 9.2 9.1 9.0  MG  --  1.6*  --    GFR: Estimated Creatinine Clearance: 96.1 mL/min (by C-G formula based on SCr of 0.74 mg/dL). Liver Function Tests: No results for input(s): AST, ALT, ALKPHOS, BILITOT, PROT, ALBUMIN in the last 168 hours. No results for input(s): LIPASE, AMYLASE in the last 168 hours. No results for input(s): AMMONIA in the last 168 hours. Coagulation Profile: No results for input(s): INR, PROTIME in the last 168 hours. Cardiac Enzymes: Recent Labs  Lab 03/08/18 1527 03/08/18 2127  TROPONINI 0.06* 0.06*   BNP (last 3 results) No results for input(s): PROBNP in the last 8760 hours. HbA1C: Recent Labs    03/08/18 1256  HGBA1C 5.4   CBG: Recent Labs  Lab 03/09/18 2022 03/10/18 0747  GLUCAP 102* 95   Lipid Profile: Recent Labs    03/09/18 0407  CHOL 130  HDL 36*  LDLCALC 81  TRIG 63  CHOLHDL 3.6   Thyroid Function Tests: Recent Labs    03/08/18 1256  TSH 1.988  FREET4 0.90   Anemia Panel: No results for input(s): VITAMINB12, FOLATE, FERRITIN, TIBC, IRON, RETICCTPCT in the last 72 hours. Sepsis Labs: No results for input(s): PROCALCITON, LATICACIDVEN in the last 168 hours.  No results found for this or any previous visit (from the past 240 hour(s)).       Radiology Studies: No results found.      Scheduled Meds: . furosemide  20 mg Intravenous BID  . guaiFENesin  600 mg Oral BID  . losartan  25 mg Oral Daily  . metoprolol tartrate  25 mg Oral Q6H  . pantoprazole  40 mg Oral Daily   Continuous Infusions: . diltiazem (CARDIZEM) infusion 5 mg/hr (03/10/18 0600)  . heparin 1,250 Units/hr (03/10/18 0600)     LOS: 0 days    Time spent:35 mins. More than 50% of that time was  spent in counseling and/or coordination of care.      Burnadette Pop, MD [facility] Pager [phone]  If 7PM-7AM, please contact night-coverage [email or web address] Password [email or web address] 03/10/2018, 11:25 AM

## 2018-03-10 NOTE — Progress Notes (Signed)
Pt ambulated to 500 feet. HR had ranged in the 150's to 184. No c/o of SOB noted from the pt.

## 2018-03-10 NOTE — Care Management (Addendum)
#    8.  PRIMARY INS : MEDICARE  PART A   AND  B          NO PHARMACY  BENEFITS ON FILE          SECONDARY INS : MEDICAID [place] ACCESS         EFF-DATE: 02-05-2018         CO-PAY- $ 3.90 FOR EACH PRESCRIPTION  ENTRESTO  49 / 51 MG  PREFERRED PHARMACY :  YES- [facility]

## 2018-03-10 NOTE — Progress Notes (Addendum)
Progress Note  Patient Name: Joanna [name] Date of Encounter: 03/10/2018  Primary Cardiologist:  Elberta Fortis /Bensimhon    Subjective   Denies chest pain or shortness of breath.  She does have some tightness in her throat when she lays down.  She denies frank orthopnea.  Edema has resolved.  She denies any palpitations or lightheadedness.  Inpatient Medications    Scheduled Meds: . furosemide  20 mg Intravenous BID  . guaiFENesin  600 mg Oral BID  . losartan  25 mg Oral Daily  . metoprolol tartrate  25 mg Oral Q6H  . pantoprazole  40 mg Oral Daily   Continuous Infusions: . diltiazem (CARDIZEM) infusion 5 mg/hr (03/10/18 0600)  . heparin 1,250 Units/hr (03/10/18 0600)   PRN Meds: acetaminophen, ondansetron (ZOFRAN) IV   Vital Signs    Vitals:   03/09/18 2106 03/10/18 0104 03/10/18 0550 03/10/18 0827  BP: (!) 123/99 (!) 116/92 (!) 128/104 (!) 126/93  Pulse: 99 70 79 96  Resp:      Temp: 98 F (36.7 C)  (!) 97.5 F (36.4 C)   TempSrc: Oral  Oral   SpO2: 98%  94%   Weight:   95.7 kg   Height:        Intake/Output Summary (Last 24 hours) at 03/10/2018 1048 Last data filed at 03/10/2018 0600 Gross per 24 hour  Intake 607.52 ml  Output 700 ml  Net -92.48 ml   Last 3 Weights 03/10/2018 03/09/2018 03/08/2018  Weight (lbs) 210 lb 14.4 oz 213 lb 1.6 oz 217 lb  Weight (kg) 95.664 kg 96.662 kg 98.431 kg      Telemetry    Atrial fibrillation in the 80s, up to 120s with activity- Personally Reviewed  ECG    No new tracings- Personally Reviewed  Physical Exam   GEN: No acute distress.   Neck:  Mild JVD Cardiac:  Irregular rhythm, no murmurs, rubs, or gallops.  Respiratory: Clear to auscultation bilaterally. GI: Soft, nontender, non-distended  MS: No edema; No deformity. Neuro:  Nonfocal  Psych: Normal affect   Labs    Chemistry Recent Labs  Lab 03/08/18 0942 03/09/18 0407 03/10/18 0402  NA 144 143 143  K 3.4* 3.1* 3.9  CL 111 109 111  CO2 22 23 22   GLUCOSE  130* 98 102*  BUN 21* 13 14  CREATININE 0.85 0.81 0.74  CALCIUM 9.2 9.1 9.0  GFRNONAA >60 >60 >60  GFRAA >60 >60 >60  ANIONGAP 11 11 10      Hematology Recent Labs  Lab 03/08/18 0942 03/09/18 0407 03/10/18 0402  WBC 6.5 5.7 6.5  RBC 4.78 4.69 4.94  HGB 12.9 13.0 13.3  HCT 43.8 41.4 44.1  MCV 91.6 88.3 89.3  MCH 27.0 27.7 26.9  MCHC 29.5* 31.4 30.2  RDW 15.0 14.6 14.6  PLT 185 244 239    Cardiac Enzymes Recent Labs  Lab 03/08/18 1527 03/08/18 2127  TROPONINI 0.06* 0.06*    Recent Labs  Lab 03/08/18 0934  TROPIPOC 0.02     BNP Recent Labs  Lab 03/08/18 0942  BNP 270.4*     DDimer No results for input(s): DDIMER in the last 168 hours.   Radiology    No results found.  Cardiac Studies   TTE 03/08/17 1. The left ventricle has severely reduced systolic function of 20-25%. The cavity size is severely increased. There is no left ventricular wall thickness. Echo evidence of pseudonormal diastolic filling patterns. 2. Grade 2 diastolic dysfunction. 3. The right  ventricle is in size. There is normal systolic function. Right ventricular systolic pressure is severely elevated with an estimated pressure of 59.9 mmHg. 4. Massively dilated left atrial size. 5. Normal right atrial size. 6. Trivial pericardial effusion, as described above. 7. The mitral valve normal in structure. Regurgitation is severe by color flow Doppler. 8. Likely severe mitral regurgitation directed posteriorly and towards the lateral wall. Coanda effect noted. Likely functional mitral regurgitation due to dilated left ventricle. 9. Normal tricuspid valve. 10. Tricuspid regurgitation moderate. 11. Pulmonic valve regurgitation is mild by color flow Doppler. 12. The inferior vena cava was dilated in size with <50% respiratory variablity. 13. No atrial level shunt detected by color flow Doppler.  Patient Profile     58 y.o. female with history of an ICM, normal coronaries 2007, systolic  and diastolic CHF, hypertension who presented with A. fib RVR.  Assessment & Plan    1.  Atrial fibrillation with RVR -Continues on Cardizem drip with improved rates in the 80s, continues in atrial fibrillation. -CHA2DS2-VASc = 3 (CHF, HTN, female), currently on IV heparin for stroke risk reduction.  Will need transition to DOAC prior to discharge. -Left atrium is severely dilated which will make maintaining sinus rhythm difficult. -Per Dr. Elberta Fortis patient will likely need rhythm control, possibly amiodarone.   -We will consider for TEE/DCCV tomorrow.  2.  Acute on chronic systolic heart failure -EF on TEE 20-25%, down from 40-45% by echo in 2017.  Likely tachycardia mediated. -Diuresing with Lasix 20 mg IV twice daily -Management includes losartan 25 mg daily, carvedilol has been switched to metoprolol for better rate control. -Patient denies shortness of breath or orthopnea but does have some tightness in her throat area when she lies flat. -Continue current diuresis. -Treating heart rate and rhythm.  Could consider Entresto at some point. -Blood pressure is stable, diastolic blood pressure running high. -The patient admits to high salt intake and we reviewed reducing sodium intake/no added salt and reducing salty foods.  3. Mitral regurgitation, severe -Likely functional due to dilated left ventricle and left atrium.  Will hopefully improve with diuresis.  4.  Electrolyte imbalance Hypokalemia and hypomagnesemia, now repleted     For questions or updates, please contact [facility] Please consult [email or web address] for contact info under        Signed, Berton Bon, NP  03/10/2018, 10:48 AM    Attending Note:   The patient was seen and examined.  Agree with assessment and plan as noted above.  Changes made to the above note as needed.  Patient seen and independently examined with [name], NP .   We discussed all aspects of the encounter. I agree with the assessment and  plan as stated above.  1.  Atrial fib :    Is on dilt drip and PO metoprolol  She is on IV heparin. Will start Eliquis this evening  Will gradually increase her dose of metoprolol and try to get her off the Dilt drip      I have spent a total of 40 minutes with patient reviewing hospital  notes , telemetry, EKGs, labs and examining patient as well as establishing an assessment and plan that was discussed with the patient. > 50% of time was spent in direct patient care.    [name], [name]., MD, [facility] 03/10/2018, 3:10 PM [address]  [address] Office [phone] Pager [phone]

## 2018-03-10 NOTE — Progress Notes (Signed)
ANTICOAGULATION CONSULT NOTE  Pharmacy Consult:  Heparin Indication: atrial fibrillation  Allergies  Allergen Reactions  . Other Other (See Comments)    Spicy foods- Cause a lot of heartburn    Patient Measurements: Height: 5\' 10"  (177.8 cm) Weight: 210 lb 14.4 oz (95.7 kg) IBW/kg (Calculated) : 68.5 Heparin Dosing Weight: 89 kg  Vital Signs: Temp: 97.5 F (36.4 C) (02/03 0550) Temp Source: Oral (02/03 0550) BP: 128/104 (02/03 0550) Pulse Rate: 79 (02/03 0550)  Labs: Recent Labs    03/08/18 0942 03/08/18 1527 03/08/18 1806 03/08/18 2127 03/09/18 0407 03/10/18 0402  HGB 12.9  --   --   --  13.0 13.3  HCT 43.8  --   --   --  41.4 44.1  PLT 185  --   --   --  244 239  HEPARINUNFRC  --   --  0.68  --  0.61 0.55  CREATININE 0.85  --   --   --  0.81 0.74  TROPONINI  --  0.06*  --  0.06*  --   --     Estimated Creatinine Clearance: 96.1 mL/min (by C-G formula based on SCr of 0.74 mg/dL).   Assessment: 41 YOF presented with SOB on exertion, found to be in Afib RVR and started on IV heparin.  Heparin level is therapeutic; no bleeding reported.  Goal of Therapy:  Heparin level 0.3-0.7 units/ml Monitor platelets by anticoagulation protocol: Yes  Plan:  Continue heparin infusion at 1250 units/hr Daily heparin level and CBC   Tattiana Fakhouri D. Laney Potash, PharmD, BCPS, BCCCP 03/10/2018, 8:09 AM

## 2018-03-11 LAB — CBC
HCT: 44.2 % (ref 36.0–46.0)
Hemoglobin: 13.6 g/dL (ref 12.0–15.0)
MCH: 27.1 pg (ref 26.0–34.0)
MCHC: 30.8 g/dL (ref 30.0–36.0)
MCV: 88.2 fL (ref 80.0–100.0)
Platelets: 232 10*3/uL (ref 150–400)
RBC: 5.01 MIL/uL (ref 3.87–5.11)
RDW: 14.6 % (ref 11.5–15.5)
WBC: 6.6 10*3/uL (ref 4.0–10.5)
nRBC: 0 % (ref 0.0–0.2)

## 2018-03-11 LAB — GLUCOSE, CAPILLARY
Glucose-Capillary: 108 mg/dL — ABNORMAL HIGH (ref 70–99)
Glucose-Capillary: 93 mg/dL (ref 70–99)

## 2018-03-11 MED ORDER — DIGOXIN 250 MCG PO TABS
0.2500 mg | ORAL_TABLET | Freq: Every day | ORAL | Status: DC
  Administered 2018-03-11 – 2018-03-14 (×4): 0.25 mg via ORAL
  Filled 2018-03-11 (×4): qty 1

## 2018-03-11 MED ORDER — FUROSEMIDE 10 MG/ML IJ SOLN
40.0000 mg | Freq: Two times a day (BID) | INTRAMUSCULAR | Status: DC
  Administered 2018-03-11 – 2018-03-12 (×3): 40 mg via INTRAVENOUS
  Filled 2018-03-11 (×4): qty 4

## 2018-03-11 MED ORDER — METOPROLOL TARTRATE 100 MG PO TABS
100.0000 mg | ORAL_TABLET | Freq: Two times a day (BID) | ORAL | Status: DC
  Administered 2018-03-11 – 2018-03-12 (×2): 100 mg via ORAL
  Filled 2018-03-11 (×2): qty 1

## 2018-03-11 NOTE — Progress Notes (Signed)
PROGRESS NOTE    Joanna [name]  RVI:[id number] DOB: November 26, 1960 DOA: 03/08/2018 PCP: Patient, No Pcp Per   Brief Narrative: Patient is a 58 year old female with past medical history of hypertension, coronary disease who presents with shortness of breath and tachycardia.  She was just discharged from here after being managed for hypertensive emergency/pulmonary edema ,was intubated and successfully extubated.  When she presented this time she was found to have new A. fib with RVR.  She was complaining of shortness of breath for a week, dyspnea on exertion, orthopnea.  Cardiology consulted.  Currently being managed for A. fib with RVR, severe systolic dysfunction.  Plan for TEE/DCCV by cardiology on Friday.  Assessment & Plan:   Principal Problem:   Atrial fibrillation with RVR (HCC) Active Problems:   Tobacco abuse   Acute systolic (congestive) heart failure (HCC)   Accelerated hypertension   Hyperglycemia  A. fib with RVR: Still on A. fib with RVR this morning. Cardizem drip D/Ced.  On metoprolol for rate control.  Also started on Eliquis .CHA2DS2-VASc Score is 3.  Cardiology planning for TEE/cardioversion on Friday.  Acute on chronic combined CHF: Was not  Following -up with cardiology as an outpatient.  Echocardiogram done here showed ejection fraction of 20 to 25% decreased from 40 to 45% as per the echo in 2017.  Also showed grade 2 diastolic dysfunction.  Likely  tachycardia induced cardiomyopathy. Patient does not look significantly  volume overloaded. She does not peripheral edema .Had mild JVD .  Lungs clear. Started on ARB .On IV lasix  Severe mitral regurgitation/massively dilated left atrial size: Management as per cardiology.  Continue Lasix  Hypertension: Recently admitted for hypertensive urgency and had to be intubated for severe pulmonary edema.  Continue to monitor blood pressure.  Continue current medications.  Smoker: Counseling provided  Hypokalemia: Supplemented  with potassium       DVT prophylaxis: Eliquis Code Status: Full Family Communication: None present at the bedside Disposition Plan: Home after full work-up   Consultants: Cardiology  Procedures: Echo  Antimicrobials:  Anti-infectives (From admission, onward)   None      Subjective: Patient seen and examined the bedside this morning.  Remains comfortable.  Still in A. fib with RVR.  Complaints of chest pain or shortness of breath..  Objective: Vitals:   03/11/18 0534 03/11/18 0754 03/11/18 0815 03/11/18 1134  BP: (!) 128/100 (!) 132/106 (!) 132/106 125/84  Pulse: 85 97 (!) 125 93  Resp:  14  16  Temp: 98.1 F (36.7 C) 97.8 F (36.6 C)  97.7 F (36.5 C)  TempSrc: Oral Oral  Oral  SpO2: 97% 97%  99%  Weight: 93.8 kg     Height:        Intake/Output Summary (Last 24 hours) at 03/11/2018 1328 Last data filed at 03/11/2018 1136 Gross per 24 hour  Intake 248.78 ml  Output 1500 ml  Net -1251.22 ml   Filed Weights   03/09/18 0300 03/10/18 0550 03/11/18 0534  Weight: 96.7 kg 95.7 kg 93.8 kg    Examination:   General exam: Appears calm and comfortable ,Not in distress,average built HEENT:PERRL,Oral mucosa moist, Ear/Nose normal on gross exam Respiratory system: Bilateral equal air entry, normal vesicular breath sounds, no wheezes or crackles  Cardiovascular system: Afib with RVR, No JVD, murmurs, rubs, gallops or clicks. Gastrointestinal system: Abdomen is nondistended, soft and nontender. No organomegaly or masses felt. Normal bowel sounds heard. Central nervous system: Alert and oriented. No focal neurological deficits. Extremities:  No edema, no clubbing ,no cyanosis, distal peripheral pulses palpable. Skin: No rashes, lesions or ulcers,no icterus ,no pallor     Data Reviewed: I have personally reviewed following labs and imaging studies  CBC: Recent Labs  Lab 03/08/18 0942 03/09/18 0407 03/10/18 0402 03/11/18 0414  WBC 6.5 5.7 6.5 6.6  NEUTROABS 3.8   --   --   --   HGB 12.9 13.0 13.3 13.6  HCT 43.8 41.4 44.1 44.2  MCV 91.6 88.3 89.3 88.2  PLT 185 244 239 232   Basic Metabolic Panel: Recent Labs  Lab 03/08/18 0942 03/09/18 0407 03/10/18 0402  NA 144 143 143  K 3.4* 3.1* 3.9  CL 111 109 111  CO2 22 23 22   GLUCOSE 130* 98 102*  BUN 21* 13 14  CREATININE 0.85 0.81 0.74  CALCIUM 9.2 9.1 9.0  MG  --  1.6*  --    GFR: Estimated Creatinine Clearance: 95.1 mL/min (by C-G formula based on SCr of 0.74 mg/dL). Liver Function Tests: No results for input(s): AST, ALT, ALKPHOS, BILITOT, PROT, ALBUMIN in the last 168 hours. No results for input(s): LIPASE, AMYLASE in the last 168 hours. No results for input(s): AMMONIA in the last 168 hours. Coagulation Profile: No results for input(s): INR, PROTIME in the last 168 hours. Cardiac Enzymes: Recent Labs  Lab 03/08/18 1527 03/08/18 2127  TROPONINI 0.06* 0.06*   BNP (last 3 results) No results for input(s): PROBNP in the last 8760 hours. HbA1C: No results for input(s): HGBA1C in the last 72 hours. CBG: Recent Labs  Lab 03/10/18 1220 03/10/18 1656 03/10/18 2055 03/11/18 0746 03/11/18 1151  GLUCAP 96 88 108* 108* 93   Lipid Profile: Recent Labs    03/09/18 0407  CHOL 130  HDL 36*  LDLCALC 81  TRIG 63  CHOLHDL 3.6   Thyroid Function Tests: No results for input(s): TSH, T4TOTAL, FREET4, T3FREE, THYROIDAB in the last 72 hours. Anemia Panel: No results for input(s): VITAMINB12, FOLATE, FERRITIN, TIBC, IRON, RETICCTPCT in the last 72 hours. Sepsis Labs: No results for input(s): PROCALCITON, LATICACIDVEN in the last 168 hours.  No results found for this or any previous visit (from the past 240 hour(s)).       Radiology Studies: No results found.      Scheduled Meds: . apixaban  5 mg Oral BID  . furosemide  40 mg Intravenous BID  . guaiFENesin  600 mg Oral BID  . losartan  25 mg Oral Daily  . metoprolol tartrate  75 mg Oral BID  . pantoprazole  40 mg Oral  Daily   Continuous Infusions:    LOS: 1 day    Time spent:35 mins. More than 50% of that time was spent in counseling and/or coordination of care.      Joanna Pop, MD [facility] Pager [phone]  If 7PM-7AM, please contact night-coverage [email or web address] Password [email or web address] 03/11/2018, 1:28 PM

## 2018-03-11 NOTE — Progress Notes (Addendum)
Progress [name]  Patient Name: Joanna [name] Date of Encounter: 03/11/2018  Primary Cardiologist: Joanna MissPhilip Reginae Wolfrey, MD -New  Subjective   No chest pain or shortness of breath.  The tightness in her throat has improved with expectorant.  She is no longer having palpitations.  No lightheadedness.  Inpatient Medications    Scheduled Meds: . apixaban  5 mg Oral BID  . furosemide  20 mg Intravenous BID  . guaiFENesin  600 mg Oral BID  . losartan  25 mg Oral Daily  . metoprolol tartrate  75 mg Oral BID  . pantoprazole  40 mg Oral Daily   Continuous Infusions:  PRN Meds: acetaminophen, ondansetron (ZOFRAN) IV   Vital Signs    Vitals:   03/11/18 0534 03/11/18 0754 03/11/18 0815 03/11/18 1134  BP: (!) 128/100 (!) 132/106 (!) 132/106 125/84  Pulse: 85 97 (!) 125 93  Resp:  14  16  Temp: 98.1 F (36.7 C) 97.8 F (36.6 C)  97.7 F (36.5 C)  TempSrc: Oral Oral  Oral  SpO2: 97% 97%  99%  Weight: 93.8 kg     Height:        Intake/Output Summary (Last 24 hours) at 03/11/2018 1242 Last data filed at 03/11/2018 1136 Gross per 24 hour  Intake 488.78 ml  Output 1500 ml  Net -1011.22 ml   Last 3 Weights 03/11/2018 03/10/2018 03/09/2018  Weight (lbs) 206 lb 12.8 oz 210 lb 14.4 oz 213 lb 1.6 oz  Weight (kg) 93.804 kg 95.664 kg 96.662 kg      Telemetry    Atrial fibrillation in the 80s-90s, up to 120s with activity- Personally Reviewed  ECG    No new tracings- Personally Reviewed  Physical Exam   GEN: No acute distress.   Neck:  Very mild JVD Cardiac:  Irregularly irregular rhythm, no murmurs, rubs, or gallops.  Respiratory: Clear to auscultation bilaterally. GI: Soft, nontender, non-distended  MS: No edema; No deformity. Neuro:  Nonfocal  Psych: Normal affect   Labs    Chemistry Recent Labs  Lab 03/08/18 0942 03/09/18 0407 03/10/18 0402  NA 144 143 143  K 3.4* 3.1* 3.9  CL 111 109 111  CO2 22 23 22   GLUCOSE 130* 98 102*  BUN 21* 13 14  CREATININE 0.85 0.81 0.74    CALCIUM 9.2 9.1 9.0  GFRNONAA >60 >60 >60  GFRAA >60 >60 >60  ANIONGAP 11 11 10      Hematology Recent Labs  Lab 03/09/18 0407 03/10/18 0402 03/11/18 0414  WBC 5.7 6.5 6.6  RBC 4.69 4.94 5.01  HGB 13.0 13.3 13.6  HCT 41.4 44.1 44.2  MCV 88.3 89.3 88.2  MCH 27.7 26.9 27.1  MCHC 31.4 30.2 30.8  RDW 14.6 14.6 14.6  PLT 244 239 232    Cardiac Enzymes Recent Labs  Lab 03/08/18 1527 03/08/18 2127  TROPONINI 0.06* 0.06*    Recent Labs  Lab 03/08/18 0934  TROPIPOC 0.02     BNP Recent Labs  Lab 03/08/18 0942  BNP 270.4*     DDimer No results for input(s): DDIMER in the last 168 hours.   Radiology    No results found.  Cardiac Studies   TTE 03/08/17 1. The left ventricle has severely reduced systolic function of 20-25%. The cavity size is severely increased. There is no left ventricular wall thickness. Echo evidence of pseudonormal diastolic filling patterns. 2. Grade 2 diastolic dysfunction. 3. The right ventricle is in size. There is normal systolic function. Right ventricular  systolic pressure is severely elevated with an estimated pressure of 59.9 mmHg. 4. Massively dilated left atrial size. 5. Normal right atrial size. 6. Trivial pericardial effusion, as described above. 7. The mitral valve normal in structure. Regurgitation is severe by color flow Doppler. 8. Likely severe mitral regurgitation directed posteriorly and towards the lateral wall. Coanda effect noted. Likely functional mitral regurgitation due to dilated left ventricle. 9. Normal tricuspid valve. 10. Tricuspid regurgitation moderate. 11. Pulmonic valve regurgitation is mild by color flow Doppler. 12. The inferior vena cava was dilated in size with <50% respiratory variablity. 13. No atrial level shunt detected by color flow Doppler.  Patient Profile     58 y.o. female with history of ICM, normal coronaries 2007, systolic and diastolic CHF, hypertension who presented with A. fib  RVR.  Assessment & Plan    1.  Atrial fibrillation with RVR -Continues in A. fib with rates 80s-90s. Metoprolol has been titrated up to 75 mg twice daily.  We will stop Cardizem drip (currenlty at 5mg /hr).  Continue to titrate metoprolol for rate control. -CHA2DS2-VASc = 3 (CHF, HTN, female),  she has been started on anticoagulation with apixaban 5 mg p.o. twice daily for stroke risk reduction. -No room on endoscopy schedule for today or tomorrow for TEE/DCCV. Will continue to work on volume management.  Will arrange for TEE/DCCV on Friday.  If patient improves significantly with controlled heart rate and volume status prior to that, we can discharge her and plan for outpatient cardioversion after 3 weeks of uninterrupted anticoagulation if patient continues in A. Fib.  2.  Acute on chronic systolic heart failure -Patient volume overloaded by echo. EF on TEE 20-25%, down from 40-45% by echo in 2017.  Likely tachycardia mediated.   -Diuresing with Lasix 20 mg IV twice daily with only modest urine output documented. Still has slight JVD. Will increase lasix to 40 mg BID -Management includes losartan 25 mg daily, carvedilol has been switched to metoprolol for better rate control.  3.  Hypertension -Patient with elevated diastolic blood pressure.  Increasing her diuretic.  4.  Mitral regurgitation, severe -Likely functional due to dilated left ventricle and left atrium.  Will hopefully improve with diuresis.    For questions or updates, please contact [facility] Please consult [email or web address] for contact info under        Signed, Joanna BonJanine Hammond, NP  03/11/2018, 12:42 PM    Joanna [name]:   The patient was seen and examined.  Agree with assessment and plan as noted above.  Changes made to the above [name] as needed.  Patient seen and independently examined with Joanna LeydenNina Hammond, NP .   We discussed all aspects of the encounter. I agree with the assessment and plan as stated above.  1.   Atrial fibrillation with rapid ventricular response.  Metoprolol has been titrated up.  Her heart rate is gradually becoming better controlled.  We tried to schedule a transesophageal echo guided cardioversion but it is not available till Friday. Going to schedule her for the TEE cardioversion but she may be feeling better and might be able to go home before that. If her heart rate is well controlled on metoprolol then she should be able go home and we can do the cardioversion as an outpatient. She will need 3 weeks of uninterrupted anticoagulation prior to her cardioversion.  Will add digoxin 0.25 mg a day to see if we can slow her HR .  Increase metoprolol to 100 mg PO BID  2.  Acute on chronic systolic congestive heart failure.  Ejection fraction is around 20 to 25%. It is likely tachycardia mediated.  She should improve with control of her heart rate. She needs additional diuresis. Continue  metoprolol and losartan.     I have spent a total of 40 minutes with patient reviewing hospital  notes , telemetry, EKGs, labs and examining patient as well as establishing an assessment and plan that was discussed with the patient. > 50% of time was spent in direct patient care.    [name], [name]., MD, [facility] 03/11/2018, 1:52 PM [address]  [address] Office [phone] Pager [phone]

## 2018-03-11 NOTE — Progress Notes (Signed)
Benefit check for Eliquis in progress; B [name] RN,MHA,BSN [phone] 

## 2018-03-11 NOTE — Care Management (Signed)
#  2.  S/W  NANNETTE  @  [facility]  # [phone]   APIXABAN : NONE FORMULARY  ELIQUIS  5 MG BID COVER-   NOT COVER PRIOR APPROVAL- YES # [id number]  DEDUCTIBLE  : NOT MET  PREFERRED PHARMACY  :  YES  - [facility]  BASIC [employer]  PLAN  PATIENT ALSO HAS MEDICAID   [place]  ACCESS  ALTERNATIVE: XARELTO - NOT COVER

## 2018-03-12 LAB — BASIC METABOLIC PANEL
ANION GAP: 12 (ref 5–15)
BUN: 16 mg/dL (ref 6–20)
CALCIUM: 9.2 mg/dL (ref 8.9–10.3)
CO2: 24 mmol/L (ref 22–32)
Chloride: 106 mmol/L (ref 98–111)
Creatinine, Ser: 0.96 mg/dL (ref 0.44–1.00)
GFR calc non Af Amer: >60 mL/min (ref >60)
Glucose, Bld: 107 mg/dL — ABNORMAL HIGH (ref 70–99)
Potassium: 3.9 mmol/L (ref 3.5–5.1)
Sodium: 142 mmol/L (ref 135–145)

## 2018-03-12 MED ORDER — METOPROLOL TARTRATE 100 MG PO TABS
100.0000 mg | ORAL_TABLET | Freq: Three times a day (TID) | ORAL | Status: DC
  Administered 2018-03-12 – 2018-03-14 (×6): 100 mg via ORAL
  Filled 2018-03-12 (×8): qty 1

## 2018-03-12 NOTE — Progress Notes (Signed)
PROGRESS NOTE    Joanna [name]  PYP:[id number] DOB: 1960-08-21 DOA: 03/08/2018 PCP: Patient, No Pcp Per   Brief Narrative: Patient is a 58 year old female with past medical history of hypertension, coronary disease who presents with shortness of breath and tachycardia.  She was just discharged from here after being managed for hypertensive emergency/pulmonary edema ,was intubated and successfully extubated.  When she presented this time she was found to have new A. fib with RVR.  She was complaining of shortness of breath for a week, dyspnea on exertion, orthopnea.  Cardiology consulted.  Currently being managed for A. fib with RVR, severe systolic dysfunction.  Plan for TEE/DCCV by cardiology on Friday.  Assessment & Plan:   Principal Problem:   Atrial fibrillation with RVR (HCC) Active Problems:   Tobacco abuse   Acute systolic (congestive) heart failure (HCC)   Accelerated hypertension   Hyperglycemia  A. fib with RVR: Still on A. fib with RVR this morning. Cardizem drip D/Ced.  On metoprolol for rate control.  Also started on Eliquis .CHA2DS2-VASc Score is 3.  Cardiology planning for TEE/cardioversion on Friday.  Acute on chronic combined CHF: Was not  Following -up with cardiology as an outpatient.  Echocardiogram done here showed ejection fraction of 20 to 25% decreased from 40 to 45% as per the echo in 2017.  Also showed grade 2 diastolic dysfunction.  Likely  tachycardia induced cardiomyopathy. Patient does not look significantly  volume overloaded. She does not peripheral edema .No JVD .  Lungs clear. Started on ARB .On IV lasix.Plan to switch to PO tomorrow.  Severe mitral regurgitation/massively dilated left atrial size: Management as per cardiology.  Continue Lasix  Hypertension: Recently admitted for hypertensive urgency and had to be intubated for severe pulmonary edema.  Continue to monitor blood pressure.  Continue current medications.  Smoker: Counseling  provided  Hypokalemia: Supplemented and corrected       DVT prophylaxis: Eliquis Code Status: Full Family Communication: None present at the bedside Disposition Plan: Home after full work-up   Consultants: Cardiology  Procedures: Echo  Antimicrobials:  Anti-infectives (From admission, onward)   None      Subjective: Patient seen and examined the bedside this morning.  Remains comfortable.  Still in A. fib with RVR.  No complaints of chest pain or shortness of breath.  Dyspneic on exertion and ambulation only. Objective: Vitals:   03/11/18 1600 03/11/18 2130 03/12/18 0200 03/12/18 0509  BP: 125/76 (!) 136/114 128/89 (!) 121/95  Pulse: 74 (!) 114 95 94  Resp: 16 13    Temp: 98 F (36.7 C) 98 F (36.7 C)  98.6 F (37 C)  TempSrc: Oral Oral  Oral  SpO2: 98% 100%  98%  Weight:    93.2 kg  Height:        Intake/Output Summary (Last 24 hours) at 03/12/2018 1136 Last data filed at 03/12/2018 0600 Gross per 24 hour  Intake 640 ml  Output 1850 ml  Net -1210 ml   Filed Weights   03/10/18 0550 03/11/18 0534 03/12/18 0509  Weight: 95.7 kg 93.8 kg 93.2 kg    Examination:  General exam: Appears calm and comfortable ,Not in distress,average built HEENT:PERRL,Oral mucosa moist, Ear/Nose normal on gross exam Respiratory system: Bilateral equal air entry, normal vesicular breath sounds, no wheezes or crackles  Cardiovascular system: Afib with RVR, No JVD, murmurs, rubs, gallops or clicks. Gastrointestinal system: Abdomen is nondistended, soft and nontender. No organomegaly or masses felt. Normal bowel sounds heard. Central nervous  system: Alert and oriented. No focal neurological deficits. Extremities: No edema, no clubbing ,no cyanosis, distal peripheral pulses palpable. Skin: No rashes, lesions or ulcers,no icterus ,no pallor    Data Reviewed: I have personally reviewed following labs and imaging studies  CBC: Recent Labs  Lab 03/08/18 0942 03/09/18 0407  03/10/18 0402 03/11/18 0414  WBC 6.5 5.7 6.5 6.6  NEUTROABS 3.8  --   --   --   HGB 12.9 13.0 13.3 13.6  HCT 43.8 41.4 44.1 44.2  MCV 91.6 88.3 89.3 88.2  PLT 185 244 239 232   Basic Metabolic Panel: Recent Labs  Lab 03/08/18 0942 03/09/18 0407 03/10/18 0402 03/12/18 0422  NA 144 143 143 142  K 3.4* 3.1* 3.9 3.9  CL 111 109 111 106  CO2 22 23 22 24   GLUCOSE 130* 98 102* 107*  BUN 21* 13 14 16   CREATININE 0.85 0.81 0.74 0.96  CALCIUM 9.2 9.1 9.0 9.2  MG  --  1.6*  --   --    GFR: Estimated Creatinine Clearance: 79.1 mL/min (by C-G formula based on SCr of 0.96 mg/dL). Liver Function Tests: No results for input(s): AST, ALT, ALKPHOS, BILITOT, PROT, ALBUMIN in the last 168 hours. No results for input(s): LIPASE, AMYLASE in the last 168 hours. No results for input(s): AMMONIA in the last 168 hours. Coagulation Profile: No results for input(s): INR, PROTIME in the last 168 hours. Cardiac Enzymes: Recent Labs  Lab 03/08/18 1527 03/08/18 2127  TROPONINI 0.06* 0.06*   BNP (last 3 results) No results for input(s): PROBNP in the last 8760 hours. HbA1C: No results for input(s): HGBA1C in the last 72 hours. CBG: Recent Labs  Lab 03/10/18 1220 03/10/18 1656 03/10/18 2055 03/11/18 0746 03/11/18 1151  GLUCAP 96 88 108* 108* 93   Lipid Profile: No results for input(s): CHOL, HDL, LDLCALC, TRIG, CHOLHDL, LDLDIRECT in the last 72 hours. Thyroid Function Tests: No results for input(s): TSH, T4TOTAL, FREET4, T3FREE, THYROIDAB in the last 72 hours. Anemia Panel: No results for input(s): VITAMINB12, FOLATE, FERRITIN, TIBC, IRON, RETICCTPCT in the last 72 hours. Sepsis Labs: No results for input(s): PROCALCITON, LATICACIDVEN in the last 168 hours.  No results found for this or any previous visit (from the past 240 hour(s)).       Radiology Studies: No results found.      Scheduled Meds: . apixaban  5 mg Oral BID  . digoxin  0.25 mg Oral Daily  . furosemide  40  mg Intravenous BID  . guaiFENesin  600 mg Oral BID  . losartan  25 mg Oral Daily  . metoprolol tartrate  100 mg Oral TID  . pantoprazole  40 mg Oral Daily   Continuous Infusions:    LOS: 2 days    Time spent:35 mins. More than 50% of that time was spent in counseling and/or coordination of care.      Burnadette Pop, MD [facility] Pager [phone]  If 7PM-7AM, please contact night-coverage [email or web address] Password [email or web address] 03/12/2018, 11:36 AM

## 2018-03-12 NOTE — Progress Notes (Signed)
Patient making bed this morning and heart rate into the 150's.  Patient back to bed and heart rate decreased.  Then patient washing herself up in the bathroom and heart rate into the 180's.  Patient returned to bed with RN encouragement and heart rate decreased into 110's.  Both times heart rate increased patient was asymptomatic.

## 2018-03-12 NOTE — Progress Notes (Addendum)
Progress Note  Patient Name: Joanna [name] Date of Encounter: 03/12/2018  Primary Cardiologist: Kristeen Miss, MD   Subjective   No complaints this morning. Elevated HRs with activity.   Inpatient Medications    Scheduled Meds: . apixaban  5 mg Oral BID  . digoxin  0.25 mg Oral Daily  . furosemide  40 mg Intravenous BID  . guaiFENesin  600 mg Oral BID  . losartan  25 mg Oral Daily  . metoprolol tartrate  100 mg Oral BID  . pantoprazole  40 mg Oral Daily   Continuous Infusions:  PRN Meds: acetaminophen, ondansetron (ZOFRAN) IV   Vital Signs    Vitals:   03/11/18 1600 03/11/18 2130 03/12/18 0200 03/12/18 0509  BP: 125/76 (!) 136/114 128/89 (!) 121/95  Pulse: 74 (!) 114 95 94  Resp: 16 13    Temp: 98 F (36.7 C) 98 F (36.7 C)  98.6 F (37 C)  TempSrc: Oral Oral  Oral  SpO2: 98% 100%  98%  Weight:    93.2 kg  Height:        Intake/Output Summary (Last 24 hours) at 03/12/2018 0936 Last data filed at 03/12/2018 0600 Gross per 24 hour  Intake 658.11 ml  Output 2650 ml  Net -1991.89 ml   Last 3 Weights 03/12/2018 03/11/2018 03/10/2018  Weight (lbs) 205 lb 6.4 oz 206 lb 12.8 oz 210 lb 14.4 oz  Weight (kg) 93.169 kg 93.804 kg 95.664 kg      Telemetry    Afib rates elevated in the 150s with activity - Personally Reviewed  ECG    N/a - Personally Reviewed  Physical Exam   GEN: No acute distress.   Neck: No JVD Cardiac: Irreg Irreg, no murmurs, rubs, or gallops.  Respiratory: Clear to auscultation bilaterally. GI: Soft, nontender, non-distended  MS: No edema; No deformity. Neuro:  Nonfocal  Psych: Normal affect   Labs    Chemistry Recent Labs  Lab 03/09/18 0407 03/10/18 0402 03/12/18 0422  NA 143 143 142  K 3.1* 3.9 3.9  CL 109 111 106  CO2 23 22 24   GLUCOSE 98 102* 107*  BUN 13 14 16   CREATININE 0.81 0.74 0.96  CALCIUM 9.1 9.0 9.2  GFRNONAA >60 >60 >60  GFRAA >60 >60 >60  ANIONGAP 11 10 12      Hematology Recent Labs  Lab 03/09/18 0407  03/10/18 0402 03/11/18 0414  WBC 5.7 6.5 6.6  RBC 4.69 4.94 5.01  HGB 13.0 13.3 13.6  HCT 41.4 44.1 44.2  MCV 88.3 89.3 88.2  MCH 27.7 26.9 27.1  MCHC 31.4 30.2 30.8  RDW 14.6 14.6 14.6  PLT 244 239 232    Cardiac Enzymes Recent Labs  Lab 03/08/18 1527 03/08/18 2127  TROPONINI 0.06* 0.06*    Recent Labs  Lab 03/08/18 0934  TROPIPOC 0.02     BNP Recent Labs  Lab 03/08/18 0942  BNP 270.4*     DDimer No results for input(s): DDIMER in the last 168 hours.   Radiology    No results found.  Cardiac Studies   TTE 03/08/17 1. The left ventricle has severely reduced systolic function of 20-25%. The cavity size is severely increased. There is no left ventricular wall thickness. Echo evidence of pseudonormal diastolic filling patterns. 2. Grade 2 diastolic dysfunction. 3. The right ventricle is in size. There is normal systolic function. Right ventricular systolic pressure is severely elevated with an estimated pressure of 59.9 mmHg. 4. Massively dilated left atrial size.  5. Normal right atrial size. 6. Trivial pericardial effusion, as described above. 7. The mitral valve normal in structure. Regurgitation is severe by color flow Doppler. 8. Likely severe mitral regurgitation directed posteriorly and towards the lateral wall. Coanda effect noted. Likely functional mitral regurgitation due to dilated left ventricle. 9. Normal tricuspid valve. 10. Tricuspid regurgitation moderate. 11. Pulmonic valve regurgitation is mild by color flow Doppler. 12. The inferior vena cava was dilated in size with <50% respiratory variablity. 13. No atrial level shunt detected by color flow Doppler.  Patient Profile     58 y.o. female with history of NICM, normal coronaries 2007, systolic and diastolic CHF, hypertension who presented with A. fib RVR.  Assessment & Plan    1.  Atrial fibrillation with RVR -Continues in A. fib with rates elevated in the 150s at times with  activity. Metoprolol has been titrated up to 100 mg twice daily. Rates are currently stable but consider adding back Dilt gtt if rates remain elevated.   -CHA2DS2-VASc = 3 (CHF, HTN, female), she has been started on anticoagulation with apixaban 5 mg p.o. twice daily for stroke risk reduction. - Planned for TEE/DCCV on Friday. Suspect she will need to stay inpatient until then as her rate is uncontrolled with activity.   2. Acute on chronic systolic heart failure -Patient volume overloaded by echo. EF on TEE 20-25%, down from 40-45% by echo in 2017. Likely tachycardia mediated.   -Diuresing well with IV lasix. Will continue, likely switch to PO in the am.  -Management includes losartan 25 mg daily, carvedilol has been switched to metoprolol for better rate control.   3.  Hypertension -Improved today.  4.  Mitral regurgitation, severe -Likely functional due to dilated left ventricle and left atrium.  Will hopefully improve with diuresis.  For questions or updates, please contact [facility] Please consult [email or web address] for contact info under        Signed, Laverda PageLindsay Roberts, NP  03/12/2018, 9:36 AM    Attending Note:   The patient was seen and examined.  Agree with assessment and plan as noted above.  Changes made to the above note as needed.  Patient seen and independently examined with Laverda PageLindsay Roberts, NP .   We discussed all aspects of the encounter. I agree with the assessment and plan as stated above.  1.  Atrial fibrillation: Her heart rates are still going up with exercise.  She is on metoprolol 100 mg twice a day.  We have added digoxin is not helped all that much. Get old for a TEE/cardioversion on Friday. Will increase her metoprolol today to 100 mg PO TID    2.  Acute on chronic systolic congestive heart failure: Continue current medications.  Continue IV Lasix.  She is diuresing fairly well. She is diuresed 1.3 L so far during this admission.    I have spent  a total of 40 minutes with patient reviewing hospital  notes , telemetry, EKGs, labs and examining patient as well as establishing an assessment and plan that was discussed with the patient. > 50% of time was spent in direct patient care.    Vesta MixerPhilip J. Azlin Zilberman, Montez HagemanJr., MD, [facility] 03/12/2018, 10:58 AM [address],  [address] Office [phone]- [phone] Pager [phone]- [phone]

## 2018-03-13 DIAGNOSIS — I5043 Acute on chronic combined systolic (congestive) and diastolic (congestive) heart failure: Secondary | ICD-10-CM

## 2018-03-13 LAB — BASIC METABOLIC PANEL
Anion gap: 12 (ref 5–15)
BUN: 16 mg/dL (ref 6–20)
CO2: 26 mmol/L (ref 22–32)
CREATININE: 0.87 mg/dL (ref 0.44–1.00)
Calcium: 9.4 mg/dL (ref 8.9–10.3)
Chloride: 104 mmol/L (ref 98–111)
GFR calc non Af Amer: >60 mL/min (ref >60)
Glucose, Bld: 114 mg/dL — ABNORMAL HIGH (ref 70–99)
Potassium: 3.8 mmol/L (ref 3.5–5.1)
Sodium: 142 mmol/L (ref 135–145)

## 2018-03-13 MED ORDER — FUROSEMIDE 40 MG PO TABS
40.0000 mg | ORAL_TABLET | Freq: Two times a day (BID) | ORAL | Status: DC
  Administered 2018-03-13 – 2018-03-14 (×3): 40 mg via ORAL
  Filled 2018-03-13 (×3): qty 1

## 2018-03-13 MED ORDER — SODIUM CHLORIDE 0.9 % IV SOLN: 250.0000 mL | INTRAVENOUS | Status: DC

## 2018-03-13 MED ORDER — SODIUM CHLORIDE 0.9% FLUSH
3.0000 mL | Freq: Two times a day (BID) | INTRAVENOUS | Status: DC
  Administered 2018-03-13 – 2018-03-15 (×4): 3 mL via INTRAVENOUS

## 2018-03-13 MED ORDER — SODIUM CHLORIDE 0.9% FLUSH: 3.0000 mL | INTRAVENOUS | Status: DC | PRN

## 2018-03-13 NOTE — Progress Notes (Signed)
PROGRESS NOTE    Joanna [name]  UJW:[id number]:[id number] DOB: 1960/02/11 DOA: 03/08/2018 PCP: Patient, No Pcp Per   Brief Narrative: Patient is a [age] year old female with past medical history of hypertension, coronary disease who presents with shortness of breath and tachycardia.  She was just discharged from here after being managed for hypertensive emergency/pulmonary edema ,was intubated and successfully extubated.  When she presented this time she was found to have new A. fib with RVR.  She was complaining of shortness of breath for a week, dyspnea on exertion, orthopnea.  Cardiology consulted.  Currently being managed for A. fib with RVR, severe systolic dysfunction.  Plan for TEE/DCCV by cardiology on Friday.  Assessment & Plan:   Principal Problem:   Atrial fibrillation with RVR (HCC) Active Problems:   Tobacco abuse   Acute systolic (congestive) heart failure (HCC)   Accelerated hypertension   Hyperglycemia  A. fib with RVR: Still on A. fib with RVR this morning. Cardizem drip D/Ced.  On metoprolol for rate control.  Also started on Eliquis .CHA2DS2-VASc Score is 3.  Cardiology planning for TEE/cardioversion on Friday.  Acute on chronic combined CHF: Was not  Following -up with cardiology as an outpatient.  Echocardiogram done here showed ejection fraction of 20 to 25% decreased from 40 to 45% as per the echo in 2017.  Also showed grade 2 diastolic dysfunction.  Likely  tachycardia induced cardiomyopathy. Patient does not look significantly  volume overloaded. She does not peripheral edema .No JVD .  Lungs clear. Started on ARB .On oral lasix 40 mg BID now.  Severe mitral regurgitation/massively dilated left atrial size: Management as per cardiology.  Continue Lasix  Hypertension: Recently admitted for hypertensive urgency and had to be intubated for severe pulmonary edema.  Continue to monitor blood pressure.  Continue current medications.  Smoker: Counseling provided  Hypokalemia:  Supplemented and corrected       DVT prophylaxis: Eliquis Code Status: Full Family Communication: None present at the bedside Disposition Plan: Home after full work-up   Consultants: Cardiology  Procedures: Echo  Antimicrobials:  Anti-infectives (From admission, onward)   None      Subjective:  Patient seen and examined the bedside this morning.  When she lies on the bed ,her heart rate is stable but as soon as he moves her heart rate jumps up to 130 to 140/min.  Denies any shortness of breath on rest.  Objective:  Vitals:   03/13/18 0619 03/13/18 0621 03/13/18 0745 03/13/18 1138  BP:  (!) 137/102 (!) 135/101   Pulse:  63  96  Resp:      Temp:  98 F (36.7 C) 98.3 F (36.8 C)   TempSrc:  Oral Oral   SpO2:  100% 100%   Weight: 92.3 kg     Height:        Intake/Output Summary (Last 24 hours) at 03/13/2018 1214 Last data filed at 03/13/2018 0745 Gross per 24 hour  Intake 480 ml  Output 1100 ml  Net -620 ml   Filed Weights   03/11/18 0534 03/12/18 0509 03/13/18 0619  Weight: 93.8 kg 93.2 kg 92.3 kg    Examination:  General exam:Not in distress,average built HEENT:PERRL,Oral mucosa moist, Ear/Nose normal on gross exam Respiratory system: Bilateral equal air entry, normal vesicular breath sounds, no wheezes or crackles  Cardiovascular system: Afib with  RRR. No JVD, murmurs, rubs, gallops or clicks. Gastrointestinal system: Abdomen is nondistended, soft and nontender. No organomegaly or masses felt. Normal bowel sounds heard.  Central nervous system: Alert and oriented. No focal neurological deficits. Extremities: No edema, no clubbing ,no cyanosis, distal peripheral pulses palpable. Skin: No rashes, lesions or ulcers,no icterus ,no pallor     Data Reviewed: I have personally reviewed following labs and imaging studies  CBC: Recent Labs  Lab 03/08/18 0942 03/09/18 0407 03/10/18 0402 03/11/18 0414  WBC 6.5 5.7 6.5 6.6  NEUTROABS 3.8  --   --   --     HGB 12.9 13.0 13.3 13.6  HCT 43.8 41.4 44.1 44.2  MCV 91.6 88.3 89.3 88.2  PLT 185 244 239 232   Basic Metabolic Panel: Recent Labs  Lab 03/08/18 0942 03/09/18 0407 03/10/18 0402 03/12/18 0422 03/13/18 0428  NA 144 143 143 142 142  K 3.4* 3.1* 3.9 3.9 3.8  CL 111 109 111 106 104  CO2 22 23 22 24 26   GLUCOSE 130* 98 102* 107* 114*  BUN 21* 13 14 16 16   CREATININE 0.85 0.81 0.74 0.96 0.87  CALCIUM 9.2 9.1 9.0 9.2 9.4  MG  --  1.6*  --   --   --    GFR: Estimated Creatinine Clearance: 86.8 mL/min (by C-G formula based on SCr of 0.87 mg/dL). Liver Function Tests: No results for input(s): AST, ALT, ALKPHOS, BILITOT, PROT, ALBUMIN in the last 168 hours. No results for input(s): LIPASE, AMYLASE in the last 168 hours. No results for input(s): AMMONIA in the last 168 hours. Coagulation Profile: No results for input(s): INR, PROTIME in the last 168 hours. Cardiac Enzymes: Recent Labs  Lab 03/08/18 1527 03/08/18 2127  TROPONINI 0.06* 0.06*   BNP (last 3 results) No results for input(s): PROBNP in the last 8760 hours. HbA1C: No results for input(s): HGBA1C in the last 72 hours. CBG: Recent Labs  Lab 03/10/18 1220 03/10/18 1656 03/10/18 2055 03/11/18 0746 03/11/18 1151  GLUCAP 96 88 108* 108* 93   Lipid Profile: No results for input(s): CHOL, HDL, LDLCALC, TRIG, CHOLHDL, LDLDIRECT in the last 72 hours. Thyroid Function Tests: No results for input(s): TSH, T4TOTAL, FREET4, T3FREE, THYROIDAB in the last 72 hours. Anemia Panel: No results for input(s): VITAMINB12, FOLATE, FERRITIN, TIBC, IRON, RETICCTPCT in the last 72 hours. Sepsis Labs: No results for input(s): PROCALCITON, LATICACIDVEN in the last 168 hours.  No results found for this or any previous visit (from the past 240 hour(s)).       Radiology Studies: No results found.      Scheduled Meds: . apixaban  5 mg Oral BID  . digoxin  0.25 mg Oral Daily  . furosemide  40 mg Oral BID  . guaiFENesin   600 mg Oral BID  . losartan  25 mg Oral Daily  . metoprolol tartrate  100 mg Oral TID  . pantoprazole  40 mg Oral Daily  . sodium chloride flush  3 mL Intravenous Q12H   Continuous Infusions: . sodium chloride       LOS: 3 days    Time spent:35 mins. More than 50% of that time was spent in counseling and/or coordination of care.      Burnadette Pop, MD [facility] Pager [phone]  If 7PM-7AM, please contact night-coverage [email or web address] Password [email or web address] 03/13/2018, 12:14 PM

## 2018-03-13 NOTE — Progress Notes (Signed)
Patient at sink, washing up, brushing teeth, cleaning up the sink area and heart rate into the 160's with that activity.  RN instructed patient to relax in bed.  Patient agreeable with RN instruction and resting heart rate 90's-110's.  Patient stated she was asymptomatic but RN did notice patient not able to speak full sentence when talking to RN without having to stop for a breath.

## 2018-03-13 NOTE — H&P (View-Only) (Signed)
Progress Note  Patient Name: Joanna [name] Date of Encounter: 03/13/2018  Primary Cardiologist: Kristeen Miss, MD   Subjective   No complaints this morning.   Inpatient Medications    Scheduled Meds: . apixaban  5 mg Oral BID  . digoxin  0.25 mg Oral Daily  . furosemide  40 mg Intravenous BID  . guaiFENesin  600 mg Oral BID  . losartan  25 mg Oral Daily  . metoprolol tartrate  100 mg Oral TID  . pantoprazole  40 mg Oral Daily   Continuous Infusions:  PRN Meds: acetaminophen, ondansetron (ZOFRAN) IV   Vital Signs    Vitals:   03/12/18 2033 03/12/18 2217 03/13/18 0619 03/13/18 0621  BP: (!) 131/91   (!) 137/102  Pulse: (!) 51 90  63  Resp: 20     Temp: 98.1 F (36.7 C)   98 F (36.7 C)  TempSrc: Oral   Oral  SpO2: 100%   100%  Weight:   92.3 kg   Height:        Intake/Output Summary (Last 24 hours) at 03/13/2018 0731 Last data filed at 03/12/2018 2220 Gross per 24 hour  Intake 240 ml  Output 1100 ml  Net -860 ml   Last 3 Weights 03/13/2018 03/12/2018 03/11/2018  Weight (lbs) 203 lb 6.4 oz 205 lb 6.4 oz 206 lb 12.8 oz  Weight (kg) 92.262 kg 93.169 kg 93.804 kg      Telemetry    Afib (70-80 at rest), elevated in the 140s with activity - Personally Reviewed  ECG    N/a - Personally Reviewed  Physical Exam   GEN: No acute distress.   Neck: No JVD Cardiac: Irreg Irreg, no murmurs, rubs, or gallops.  Respiratory: Clear to auscultation bilaterally. GI: Soft, nontender, non-distended  MS: No edema; No deformity. Neuro:  Nonfocal  Psych: Normal affect   Labs    Chemistry Recent Labs  Lab 03/10/18 0402 03/12/18 0422 03/13/18 0428  NA 143 142 142  K 3.9 3.9 3.8  CL 111 106 104  CO2 22 24 26   GLUCOSE 102* 107* 114*  BUN 14 16 16   CREATININE 0.74 0.96 0.87  CALCIUM 9.0 9.2 9.4  GFRNONAA >60 >60 >60  GFRAA >60 >60 >60  ANIONGAP 10 12 12      Hematology Recent Labs  Lab 03/09/18 0407 03/10/18 0402 03/11/18 0414  WBC 5.7 6.5 6.6  RBC 4.69  4.94 5.01  HGB 13.0 13.3 13.6  HCT 41.4 44.1 44.2  MCV 88.3 89.3 88.2  MCH 27.7 26.9 27.1  MCHC 31.4 30.2 30.8  RDW 14.6 14.6 14.6  PLT 244 239 232    Cardiac Enzymes Recent Labs  Lab 03/08/18 1527 03/08/18 2127  TROPONINI 0.06* 0.06*    Recent Labs  Lab 03/08/18 0934  TROPIPOC 0.02     BNP Recent Labs  Lab 03/08/18 0942  BNP 270.4*     DDimer No results for input(s): DDIMER in the last 168 hours.   Radiology    No results found.  Cardiac Studies   TTE 03/08/17 1. The left ventricle has severely reduced systolic function of 20-25%. The cavity size is severely increased. There is no left ventricular wall thickness. Echo evidence of pseudonormal diastolic filling patterns. 2. Grade 2 diastolic dysfunction. 3. The right ventricle is in size. There is normal systolic function. Right ventricular systolic pressure is severely elevated with an estimated pressure of 59.9 mmHg. 4. Massively dilated left atrial size. 5. Normal right atrial size.  6. Trivial pericardial effusion, as described above. 7. The mitral valve normal in structure. Regurgitation is severe by color flow Doppler. 8. Likely severe mitral regurgitation directed posteriorly and towards the lateral wall. Coanda effect noted. Likely functional mitral regurgitation due to dilated left ventricle. 9. Normal tricuspid valve. 10. Tricuspid regurgitation moderate. 11. Pulmonic valve regurgitation is mild by color flow Doppler. 12. The inferior vena cava was dilated in size with <50% respiratory variablity. 13. No atrial level shunt detected by color flow Doppler.  Patient Profile     58 y.o. female with history of NICM, normal coronaries 2007, systolic and diastolic CHF, hypertension who presented with A. fib RVR.  Assessment & Plan    1. Atrial fibrillation with RVR -Continues in A. fib with rates that elevate with activity, but in the 70-80s with rest.  Metoprolol has been titrated up to 100 mg  TID. -CHA2DS2-VASc = 3 (CHF, HTN, female),she has been started on anticoagulation with apixaban 5 mg p.o. twice daily for stroke risk reduction. - Planned for TEE/DCCV tomorrow. NPO at midnight.  2. Acute on chronic systolic heart failure -Patient volume overloaded by echo.EF on TEE 20-25%, down from 40-45% by echo in 2017. Likely tachycardia mediated.  -Diuresed well with IV lasix, will switch to PO 40mg  BID today. -Management includes losartan 25 mg daily, carvedilol has been switched to metoprolol for better rate control.  -discussed the need for daily weights and Na+ restricted diet  3. Hypertension -Improved with current regimen.   4. Mitral regurgitation, severe -Likely functional due to dilated left ventricle and left atrium.   For questions or updates, please contact [facility] Please consult [email or web address] for contact info under        Signed, Laverda Page, NP  03/13/2018, 7:31 AM     Attending Note:   The patient was seen and examined.  Agree with assessment and plan as noted above.  Changes made to the above note as needed.  Patient seen and independently examined with Laverda Page, NP .   We discussed all aspects of the encounter. I agree with the assessment and plan as stated above.  1.  Atrial fibrillation with rapid ventricular response. Rates remain very high.  Metoprolol has now been increased to 100 mg 3 times a day.  She is on Eliquis 5 mg twice a day. He is scheduled for transesophageal echocardiogram/cardioversion tomorrow.  2.  Acute on chronic combined systolic and diastolic congestive heart failure:   echocardiogram recently revealed an LVEF of 20 to 25%.  Hopefully this will improve as we get her back into sinus rhythm.   I have spent a total of 40 minutes with patient reviewing hospital  notes , telemetry, EKGs, labs and examining patient as well as establishing an assessment and plan that was discussed with the patient. > 50% of  time was spent in direct patient care.    [name], [name]., MD, [facility] 03/13/2018, 10:07 AM [address]  [address] Office [phone] Pager [phone]

## 2018-03-13 NOTE — Progress Notes (Signed)
Patient's brother, Flora Lipps called for an update on patient.  RN asked patient if it was ok to speak to Ethelene Browns about patient and patient stated yes, I could give him an update.

## 2018-03-13 NOTE — Progress Notes (Addendum)
Progress Note  Patient Name: Joanna [name] Date of Encounter: 03/13/2018  Primary Cardiologist: Kristeen Miss, MD   Subjective   No complaints this morning.   Inpatient Medications    Scheduled Meds: . apixaban  5 mg Oral BID  . digoxin  0.25 mg Oral Daily  . furosemide  40 mg Intravenous BID  . guaiFENesin  600 mg Oral BID  . losartan  25 mg Oral Daily  . metoprolol tartrate  100 mg Oral TID  . pantoprazole  40 mg Oral Daily   Continuous Infusions:  PRN Meds: acetaminophen, ondansetron (ZOFRAN) IV   Vital Signs    Vitals:   03/12/18 2033 03/12/18 2217 03/13/18 0619 03/13/18 0621  BP: (!) 131/91   (!) 137/102  Pulse: (!) 51 90  63  Resp: 20     Temp: 98.1 F (36.7 C)   98 F (36.7 C)  TempSrc: Oral   Oral  SpO2: 100%   100%  Weight:   92.3 kg   Height:        Intake/Output Summary (Last 24 hours) at 03/13/2018 0731 Last data filed at 03/12/2018 2220 Gross per 24 hour  Intake 240 ml  Output 1100 ml  Net -860 ml   Last 3 Weights 03/13/2018 03/12/2018 03/11/2018  Weight (lbs) 203 lb 6.4 oz 205 lb 6.4 oz 206 lb 12.8 oz  Weight (kg) 92.262 kg 93.169 kg 93.804 kg      Telemetry    Afib (70-80 at rest), elevated in the 140s with activity - Personally Reviewed  ECG    N/a - Personally Reviewed  Physical Exam   GEN: No acute distress.   Neck: No JVD Cardiac: Irreg Irreg, no murmurs, rubs, or gallops.  Respiratory: Clear to auscultation bilaterally. GI: Soft, nontender, non-distended  MS: No edema; No deformity. Neuro:  Nonfocal  Psych: Normal affect   Labs    Chemistry Recent Labs  Lab 03/10/18 0402 03/12/18 0422 03/13/18 0428  NA 143 142 142  K 3.9 3.9 3.8  CL 111 106 104  CO2 22 24 26   GLUCOSE 102* 107* 114*  BUN 14 16 16   CREATININE 0.74 0.96 0.87  CALCIUM 9.0 9.2 9.4  GFRNONAA >60 >60 >60  GFRAA >60 >60 >60  ANIONGAP 10 12 12      Hematology Recent Labs  Lab 03/09/18 0407 03/10/18 0402 03/11/18 0414  WBC 5.7 6.5 6.6  RBC 4.69  4.94 5.01  HGB 13.0 13.3 13.6  HCT 41.4 44.1 44.2  MCV 88.3 89.3 88.2  MCH 27.7 26.9 27.1  MCHC 31.4 30.2 30.8  RDW 14.6 14.6 14.6  PLT 244 239 232    Cardiac Enzymes Recent Labs  Lab 03/08/18 1527 03/08/18 2127  TROPONINI 0.06* 0.06*    Recent Labs  Lab 03/08/18 0934  TROPIPOC 0.02     BNP Recent Labs  Lab 03/08/18 0942  BNP 270.4*     DDimer No results for input(s): DDIMER in the last 168 hours.   Radiology    No results found.  Cardiac Studies   TTE 03/08/17 1. The left ventricle has severely reduced systolic function of 20-25%. The cavity size is severely increased. There is no left ventricular wall thickness. Echo evidence of pseudonormal diastolic filling patterns. 2. Grade 2 diastolic dysfunction. 3. The right ventricle is in size. There is normal systolic function. Right ventricular systolic pressure is severely elevated with an estimated pressure of 59.9 mmHg. 4. Massively dilated left atrial size. 5. Normal right atrial size.  6. Trivial pericardial effusion, as described above. 7. The mitral valve normal in structure. Regurgitation is severe by color flow Doppler. 8. Likely severe mitral regurgitation directed posteriorly and towards the lateral wall. Coanda effect noted. Likely functional mitral regurgitation due to dilated left ventricle. 9. Normal tricuspid valve. 10. Tricuspid regurgitation moderate. 11. Pulmonic valve regurgitation is mild by color flow Doppler. 12. The inferior vena cava was dilated in size with <50% respiratory variablity. 13. No atrial level shunt detected by color flow Doppler.  Patient Profile     58 y.o. female with history of NICM, normal coronaries 2007, systolic and diastolic CHF, hypertension who presented with A. fib RVR.  Assessment & Plan    1. Atrial fibrillation with RVR -Continues in A. fib with rates that elevate with activity, but in the 70-80s with rest.  Metoprolol has been titrated up to 100 mg  TID. -CHA2DS2-VASc = 3 (CHF, HTN, female),she has been started on anticoagulation with apixaban 5 mg p.o. twice daily for stroke risk reduction. - Planned for TEE/DCCV tomorrow. NPO at midnight.  2. Acute on chronic systolic heart failure -Patient volume overloaded by echo.EF on TEE 20-25%, down from 40-45% by echo in 2017. Likely tachycardia mediated.  -Diuresed well with IV lasix, will switch to PO 40mg BID today. -Management includes losartan 25 mg daily, carvedilol has been switched to metoprolol for better rate control.  -discussed the need for daily weights and Na+ restricted diet  3. Hypertension -Improved with current regimen.   4. Mitral regurgitation, severe -Likely functional due to dilated left ventricle and left atrium.   For questions or updates, please contact [facility] Please consult [email or web address] for contact info under        Signed, Lindsay Roberts, NP  03/13/2018, 7:31 AM     Attending Note:   The patient was seen and examined.  Agree with assessment and plan as noted above.  Changes made to the above note as needed.  Patient seen and independently examined with Lindsay Roberts, NP .   We discussed all aspects of the encounter. I agree with the assessment and plan as stated above.  1.  Atrial fibrillation with rapid ventricular response. Rates remain very high.  Metoprolol has now been increased to 100 mg 3 times a day.  She is on Eliquis 5 mg twice a day. He is scheduled for transesophageal echocardiogram/cardioversion tomorrow.  2.  Acute on chronic combined systolic and diastolic congestive heart failure:   echocardiogram recently revealed an LVEF of 20 to 25%.  Hopefully this will improve as we get her back into sinus rhythm.   I have spent a total of 40 minutes with patient reviewing hospital  notes , telemetry, EKGs, labs and examining patient as well as establishing an assessment and plan that was discussed with the patient. > 50% of  time was spent in direct patient care.     J. , Jr., MD, FACC 03/13/2018, 10:07 AM [address],  [address] Office - [phone] Pager [phone]     

## 2018-03-14 ENCOUNTER — Inpatient Hospital Stay (HOSPITAL_COMMUNITY): Payer: Medicare Other

## 2018-03-14 ENCOUNTER — Inpatient Hospital Stay (HOSPITAL_COMMUNITY): Payer: Medicare Other | Admitting: Anesthesiology

## 2018-03-14 ENCOUNTER — Encounter (HOSPITAL_COMMUNITY): Payer: Self-pay | Admitting: *Deleted

## 2018-03-14 ENCOUNTER — Encounter (HOSPITAL_COMMUNITY)
Admission: EM | Disposition: A | Discharge status: Home / Self Care | Payer: Self-pay | Source: Home / Self Care | Attending: Internal Medicine

## 2018-03-14 DIAGNOSIS — I313 Pericardial effusion (noninflammatory): Secondary | ICD-10-CM

## 2018-03-14 DIAGNOSIS — I340 Nonrheumatic mitral (valve) insufficiency: Secondary | ICD-10-CM

## 2018-03-14 HISTORY — PX: CARDIOVERSION: SHX1299

## 2018-03-14 HISTORY — PX: TEE WITHOUT CARDIOVERSION: SHX5443

## 2018-03-14 LAB — BASIC METABOLIC PANEL
Anion gap: 13 (ref 5–15)
BUN: 17 mg/dL (ref 6–20)
CO2: 25 mmol/L (ref 22–32)
Calcium: 9.3 mg/dL (ref 8.9–10.3)
Chloride: 105 mmol/L (ref 98–111)
Creatinine, Ser: 0.87 mg/dL (ref 0.44–1.00)
GFR calc non Af Amer: >60 mL/min (ref >60)
Glucose, Bld: 116 mg/dL — ABNORMAL HIGH (ref 70–99)
Potassium: 3.9 mmol/L (ref 3.5–5.1)
Sodium: 143 mmol/L (ref 135–145)

## 2018-03-14 SURGERY — ECHOCARDIOGRAM, TRANSESOPHAGEAL
Anesthesia: General

## 2018-03-14 MED ORDER — SACUBITRIL-VALSARTAN 24-26 MG PO TABS
1.0000 | ORAL_TABLET | Freq: Two times a day (BID) | ORAL | Status: DC
  Administered 2018-03-14 – 2018-03-15 (×2): 1 via ORAL
  Filled 2018-03-14 (×3): qty 1

## 2018-03-14 MED ORDER — BUTAMBEN-TETRACAINE-BENZOCAINE 2-2-14 % EX AERO
INHALATION_SPRAY | CUTANEOUS | Status: DC | PRN
  Administered 2018-03-14: 2 via TOPICAL

## 2018-03-14 MED ORDER — LIDOCAINE HCL (CARDIAC) PF 100 MG/5ML IV SOSY
PREFILLED_SYRINGE | INTRAVENOUS | Status: DC | PRN
  Administered 2018-03-14: 60 mg via INTRAVENOUS

## 2018-03-14 MED ORDER — PROPOFOL 500 MG/50ML IV EMUL
INTRAVENOUS | Status: DC | PRN
  Administered 2018-03-14: 75 ug/kg/min via INTRAVENOUS

## 2018-03-14 MED ORDER — LACTATED RINGERS IV SOLN
INTRAVENOUS | Status: DC | PRN
  Administered 2018-03-14: 14:00:00 via INTRAVENOUS

## 2018-03-14 MED ORDER — LIVING BETTER WITH HEART FAILURE BOOK
Freq: Once | Status: AC
  Administered 2018-03-14: 21:00:00

## 2018-03-14 MED ORDER — PROPOFOL 10 MG/ML IV BOLUS
INTRAVENOUS | Status: DC | PRN
  Administered 2018-03-14: 20 mg via INTRAVENOUS

## 2018-03-14 MED ORDER — METOPROLOL TARTRATE 50 MG PO TABS: 50.0000 mg | ORAL_TABLET | Freq: Two times a day (BID) | ORAL | Status: DC

## 2018-03-14 MED ORDER — FUROSEMIDE 40 MG PO TABS
40.0000 mg | ORAL_TABLET | Freq: Every day | ORAL | Status: DC
  Administered 2018-03-15: 40 mg via ORAL
  Filled 2018-03-14 (×2): qty 1

## 2018-03-14 NOTE — Progress Notes (Signed)
  Echocardiogram Echocardiogram Transesophageal has been performed.  Janalyn Harder 03/14/2018, 2:56 PM

## 2018-03-14 NOTE — Progress Notes (Addendum)
Progress Note  Patient Name: Joanna [name] Date of Encounter: 03/14/2018  Primary Cardiologist: Kristeen Miss, MD  Subjective   Hard for her to tell if she is feeling better. No acute complaints but if she lays down or walks she still gets some chest pressure. TEE DCCV planned for today. Drinks a lot of water at home.  She is now s/p successful cardioversion    Inpatient Medications    Scheduled Meds: . apixaban  5 mg Oral BID  . digoxin  0.25 mg Oral Daily  . furosemide  40 mg Oral BID  . guaiFENesin  600 mg Oral BID  . losartan  25 mg Oral Daily  . metoprolol tartrate  100 mg Oral TID  . pantoprazole  40 mg Oral Daily  . sodium chloride flush  3 mL Intravenous Q12H   Continuous Infusions: . sodium chloride Stopped (03/13/18 0800)   PRN Meds: acetaminophen, ondansetron (ZOFRAN) IV, sodium chloride flush   Vital Signs    Vitals:   03/13/18 1659 03/13/18 1928 03/14/18 0445 03/14/18 0448  BP: (!) 150/94 (!) 139/105 (!) 127/94   Pulse: 89 (!) 113 67   Resp: 18 18 14    Temp: 98 F (36.7 C) 98 F (36.7 C) 98.1 F (36.7 C)   TempSrc: Oral Oral Oral   SpO2: 99% 100% 100%   Weight:    93.1 kg  Height:        Intake/Output Summary (Last 24 hours) at 03/14/2018 0950 Last data filed at 03/14/2018 0400 Gross per 24 hour  Intake 825 ml  Output 1000 ml  Net -175 ml   Last 3 Weights 03/14/2018 03/13/2018 03/12/2018  Weight (lbs) 205 lb 3.2 oz 203 lb 6.4 oz 205 lb 6.4 oz  Weight (kg) 93.078 kg 92.262 kg 93.169 kg     Telemetry    Atrial fib variable response, 80s-150s - Personally Reviewed  Physical Exam   GEN: No acute distress.  HEENT: Normocephalic, atraumatic, sclera non-icteric. Neck: No JVD or bruits. Cardiac: Irregularly irregular, tachycardic at times, no murmurs, rubs, or gallops.  ---->  RR post cardioversion  Radials/DP/PT 1+ and equal bilaterally.  Respiratory: Clear to auscultation bilaterally. Breathing is unlabored. GI: Soft, nontender,  non-distended, BS +x 4. MS: no deformity. Extremities: No clubbing or cyanosis. No edema. Distal pedal pulses are 2+ and equal bilaterally. Neuro:  AAOx3. Follows commands. Psych:  Responds to questions appropriately with a normal affect.  Labs    Chemistry Recent Labs  Lab 03/12/18 0422 03/13/18 0428 03/14/18 0436  NA 142 142 143  K 3.9 3.8 3.9  CL 106 104 105  CO2 24 26 25   GLUCOSE 107* 114* 116*  BUN 16 16 17   CREATININE 0.96 0.87 0.87  CALCIUM 9.2 9.4 9.3  GFRNONAA >60 >60 >60  GFRAA >60 >60 >60  ANIONGAP 12 12 13      Hematology Recent Labs  Lab 03/09/18 0407 03/10/18 0402 03/11/18 0414  WBC 5.7 6.5 6.6  RBC 4.69 4.94 5.01  HGB 13.0 13.3 13.6  HCT 41.4 44.1 44.2  MCV 88.3 89.3 88.2  MCH 27.7 26.9 27.1  MCHC 31.4 30.2 30.8  RDW 14.6 14.6 14.6  PLT 244 239 232    Cardiac Enzymes Recent Labs  Lab 03/08/18 1527 03/08/18 2127  TROPONINI 0.06* 0.06*    Recent Labs  Lab 03/08/18 0934  TROPIPOC 0.02     BNP Recent Labs  Lab 03/08/18 0942  BNP 270.4*     DDimer No results for  input(s): DDIMER in the last 168 hours.   Radiology    No results found.  Cardiac Studies   2d echo 03/08/18 IMPRESSIONS  1. The left ventricle has severely reduced systolic function of 20-25%. The cavity size is severely increased. There is no left ventricular wall thickness. Echo evidence of pseudonormal diastolic filling patterns.  2. Grade 2 diastolic dysfunction.  3. The right ventricle is in size. There is normal systolic function. Right ventricular systolic pressure is severely elevated with an estimated pressure of 59.9 mmHg.  4. Massively dilated left atrial size.  5. Normal right atrial size.  6. Trivial pericardial effusion, as described above.  7. The mitral valve normal in structure. Regurgitation is severe by color flow Doppler.  8. Likely severe mitral regurgitation directed posteriorly and towards the lateral wall. Coanda effect noted. Likely functional  mitral regurgitation due to dilated left ventricle.  9. Normal tricuspid valve. 10. Tricuspid regurgitation moderate. 11. Pulmonic valve regurgitation is mild by color flow Doppler. 12. The inferior vena cava was dilated in size with <50% respiratory variablity. 13. No atrial level shunt detected by color flow Doppler  Patient Profile     58 y.o. female with chronic combined CHF and longstanding NICM, normal coronaries 2007, hypertension. She was admitted 02/2018 with hypertensive emergency/pulm edema requiring intubation after having been off her meds for a while. OP f/u recommended but did not occur. She was admitted with worsening chest pressure and DOE, found to have rapid AF RVR, and drop in EF to 20-25% with severe MR and severely elevated RVSP.  Assessment & Plan    1. New onset AF RVR - HR remains variable despite metoprolol titration and digoxin. Plan TEE DCCV today, procedure discussed with patient who is in agreement to proceed. Note massively dilated LA so may ultimately need antiarrhythmic if she does not either convert or maintain NSR. Continue Eliquis.  2. Acute on chronic combined CHF with severe pulmonary HTN as well - clinically does not appear acutely volume overloaded - was diuresed with IV Lasix earlier this admission, now on oral Lasix. Will benefit from CHF med titration going forward contingent on post-DCCV hemodynamics, IE changing losartan to [facility] or addition of spironolactone. Will need close f/u for med titration (and also eventual consideration of ICD if EF remains low). Reviewed 2g sodium restriction, 2L fluid restriction, daily weights with patient. Will rx CHF book. Her drop in EF is felt to be tachy-mediated but would not be unreasonable to update ischemic testing once back in NSR, particularly if she has continued symptoms despite normal rhythm. Troponins were low/flat this admission. Consider OP sleep study given snoring, AF, ?RVSP.  3. Severe mitral  regurgitation - per echo report, likely functional due to dilated LV and LA. Will need to reassess by echo going forward as well.  4. HTN - controlled.  For questions or updates, please contact [facility] Please consult [email or web address] for contact info under Cardiology/STEMI.  Signed, Laurann Montana, PA-C 03/14/2018, 9:50 AM     Attending Note:   The patient was seen and examined.  Agree with assessment and plan as noted above.  Changes made to the above note as needed.  Patient seen and independently examined with  Ronie Spies, PA .   We discussed all aspects of the encounter. I agree with the assessment and plan as stated above.  1.  Atrial fibrillation: The patient had a successful TEE cardioversion earlier today.  She is feeling quite a bit better.  2.  Acute on chronic combined systolic and diastolic congestive heart failure with severe pulmonary hypertension: She has  diuresed nicely. Agree with changing the Losartan to [facility]  She has improved significanlty  I anticipate DC probably tomorrow .  DC digoxin Reduce lasix to 40 mg a day  Consider spironolactone as OP   Have her see me or an APP in 3 weeks   I have spent a total of 40 minutes with patient reviewing hospital  notes , telemetry, EKGs, labs and examining patient as well as establishing an assessment and plan that was discussed with the patient. > 50% of time was spent in direct patient care.    [name], [name]., MD, [facility] 03/14/2018, 4:42 PM [address],  [address] Office [phone] Pager [phone]

## 2018-03-14 NOTE — Progress Notes (Signed)
PROGRESS NOTE    Joanna [name]  IWO:[id number] DOB: 08/10/1960 DOA: 03/08/2018 PCP: Patient, No Pcp Per   Brief Narrative: Patient is a 58 year old female with past medical history of hypertension, coronary disease who presents with shortness of breath and tachycardia.  She was just discharged from here after being managed for hypertensive emergency/pulmonary edema ,was intubated and successfully extubated.  When she presented this time she was found to have new A. fib with RVR.  She was complaining of shortness of breath for a week, dyspnea on exertion, orthopnea.  Cardiology consulted.  Currently being managed for A. fib with RVR, severe systolic dysfunction.  Plan for TEE/DCCV by cardiology on Friday.  03/14/2018: Patient seen alongside 2 sisters and brother-in-law.  Patient underwent TEE and cardioversion earlier today.  Patient is back to normal sinus rhythm.  Will monitor patient overnight, and likely discharge patient back, if okay with the cardiology team.  Assessment & Plan:   Principal Problem:   Atrial fibrillation with RVR (HCC) Active Problems:   Tobacco abuse   Acute systolic (congestive) heart failure (HCC)   Accelerated hypertension   Hyperglycemia  A. fib with RVR: Still on A. fib with RVR this morning. Cardizem drip D/Ced.  On metoprolol for rate control.  Also started on Eliquis .CHA2DS2-VASc Score is 3.  Cardiology planning for TEE/cardioversion on Friday. 03/14/2018: Patient seen alongside 2 sisters and brother-in-law.  Patient underwent TEE and cardioversion earlier today.  Patient is back to normal sinus rhythm.  Will monitor patient overnight, and likely discharge patient back, if okay with the cardiology team.  Acute on chronic combined CHF: Was not  Following -up with cardiology as an outpatient.  Echocardiogram done here showed ejection fraction of 20 to 25% decreased from 40 to 45% as per the echo in 2017.  Also showed grade 2 diastolic dysfunction.  Likely   tachycardia induced cardiomyopathy. Patient does not look significantly  volume overloaded. She does not peripheral edema .No JVD .  Lungs clear. Started on ARB .On oral lasix 40 mg BID now. 03/14/2018: Stable.  Continue current management.  Severe mitral regurgitation/massively dilated left atrial size: Management as per cardiology.  Continue Lasix  Hypertension: Recently admitted for hypertensive urgency and had to be intubated for severe pulmonary edema.  Continue to monitor blood pressure.  Continue current medications. 03/14/2018: Last blood pressure was 128/73 mmHg.  Smoker: Counseling provided  Hypokalemia: Supplemented and corrected 03/14/2018: Resolved.  Last potassium was 3.9.  DVT prophylaxis: Eliquis Code Status: Full Family Communication: None present at the bedside Disposition Plan: Home after full work-up   Consultants: Cardiology  Procedures: Echo  Antimicrobials:  Anti-infectives (From admission, onward)   None      Subjective: Patient seen. No new complaints. No shortness of breath. No chest pain. No palpitations. Objective:  Vitals:   03/14/18 1458 03/14/18 1505 03/14/18 1515 03/14/18 1712  BP: 103/75 114/75 (!) 116/57 128/73  Pulse: (!) 59 (!) 57 (!) 49   Resp: 14 19 12    Temp: 97.9 F (36.6 C)     TempSrc: Oral     SpO2: 100% 100% 99%   Weight:      Height:        Intake/Output Summary (Last 24 hours) at 03/14/2018 1719 Last data filed at 03/14/2018 1000 Gross per 24 hour  Intake 603 ml  Output 1000 ml  Net -397 ml   Filed Weights   03/12/18 0509 03/13/18 0619 03/14/18 0448  Weight: 93.2 kg 92.3 kg 93.1 kg  Examination:  General exam:Not in distress,average built HEENT:PERRL,Oral mucosa moist, Ear/Nose normal on gross exam Respiratory system: Clear to auscultation. Cardiovascular system: S1-S2, normal sinus rhythm. Gastrointestinal system: Abdomen is nondistended, soft and nontender. No organomegaly or masses felt. Normal bowel sounds  heard. Central nervous system: Alert and oriented. No focal neurological deficits. Extremities: No edema, no clubbing ,no cyanosis, distal peripheral pulses palpable.  Data Reviewed: I have personally reviewed following labs and imaging studies  CBC: Recent Labs  Lab 03/08/18 0942 03/09/18 0407 03/10/18 0402 03/11/18 0414  WBC 6.5 5.7 6.5 6.6  NEUTROABS 3.8  --   --   --   HGB 12.9 13.0 13.3 13.6  HCT 43.8 41.4 44.1 44.2  MCV 91.6 88.3 89.3 88.2  PLT 185 244 239 232   Basic Metabolic Panel: Recent Labs  Lab 03/09/18 0407 03/10/18 0402 03/12/18 0422 03/13/18 0428 03/14/18 0436  NA 143 143 142 142 143  K 3.1* 3.9 3.9 3.8 3.9  CL 109 111 106 104 105  CO2 23 22 24 26 25   GLUCOSE 98 102* 107* 114* 116*  BUN 13 14 16 16 17   CREATININE 0.81 0.74 0.96 0.87 0.87  CALCIUM 9.1 9.0 9.2 9.4 9.3  MG 1.6*  --   --   --   --    GFR: Estimated Creatinine Clearance: 87.1 mL/min (by C-G formula based on SCr of 0.87 mg/dL). Liver Function Tests: No results for input(s): AST, ALT, ALKPHOS, BILITOT, PROT, ALBUMIN in the last 168 hours. No results for input(s): LIPASE, AMYLASE in the last 168 hours. No results for input(s): AMMONIA in the last 168 hours. Coagulation Profile: No results for input(s): INR, PROTIME in the last 168 hours. Cardiac Enzymes: Recent Labs  Lab 03/08/18 1527 03/08/18 2127  TROPONINI 0.06* 0.06*   BNP (last 3 results) No results for input(s): PROBNP in the last 8760 hours. HbA1C: No results for input(s): HGBA1C in the last 72 hours. CBG: Recent Labs  Lab 03/10/18 1220 03/10/18 1656 03/10/18 2055 03/11/18 0746 03/11/18 1151  GLUCAP 96 88 108* 108* 93   Lipid Profile: No results for input(s): CHOL, HDL, LDLCALC, TRIG, CHOLHDL, LDLDIRECT in the last 72 hours. Thyroid Function Tests: No results for input(s): TSH, T4TOTAL, FREET4, T3FREE, THYROIDAB in the last 72 hours. Anemia Panel: No results for input(s): VITAMINB12, FOLATE, FERRITIN, TIBC, IRON,  RETICCTPCT in the last 72 hours. Sepsis Labs: No results for input(s): PROCALCITON, LATICACIDVEN in the last 168 hours.  No results found for this or any previous visit (from the past 240 hour(s)).   Radiology Studies: No results found.  Scheduled Meds: . apixaban  5 mg Oral BID  . [START ON 03/15/2018] furosemide  40 mg Oral Daily  . guaiFENesin  600 mg Oral BID  . Living Better with Heart Failure Book   Does not apply Once  . [START ON 03/15/2018] metoprolol tartrate  50 mg Oral BID  . pantoprazole  40 mg Oral Daily  . sacubitril-valsartan  1 tablet Oral BID  . sodium chloride flush  3 mL Intravenous Q12H   Continuous Infusions: . sodium chloride Stopped (03/13/18 0800)     LOS: 4 days    Time spent:35 mins. More than 50% of that time was spent in counseling and/or coordination of care.   Barnetta Chapel, MD [facility] Pager [phone]. If 7PM-7AM, please contact night-coverage [email or web address] Password [facility] 03/14/2018, 5:19 PM

## 2018-03-14 NOTE — Anesthesia Preprocedure Evaluation (Signed)
Anesthesia Evaluation  Patient identified by MRN, date of birth, ID band Patient awake    Reviewed: Allergy & Precautions, H&P , NPO status , Patient's Chart, lab work & pertinent test results, reviewed documented beta blocker date and time   Airway Mallampati: II  TM Distance: >3 FB Neck ROM: full    Dental no notable dental hx.    Pulmonary neg pulmonary ROS, former smoker,    Pulmonary exam normal breath sounds clear to auscultation       Cardiovascular Exercise Tolerance: Good hypertension, Pt. on medications and Pt. on home beta blockers +CHF  + dysrhythmias Atrial Fibrillation  Rhythm:regular Rate:Normal  a. 08/2005 Echo: EF 30-35%, mod diff HK. Mild to Mod MR. Mildly dil LA; b. 08/2005 Cath: Nl Cors. Elevated CO w/o shunt; c. 09/2007 Echo: EF 45%. Mild to mod MR; c. 03/2015 Echo: EF 45%, global HK. Gr1 DD. Mild MR. Mildly dil LA.   Neuro/Psych negative neurological ROS  negative psych ROS   GI/Hepatic negative GI ROS, Neg liver ROS,   Endo/Other  negative endocrine ROS  Renal/GU Renal disease  negative genitourinary   Musculoskeletal   Abdominal   Peds  Hematology negative hematology ROS (+)   Anesthesia Other Findings   Reproductive/Obstetrics negative OB ROS                             Anesthesia Physical Anesthesia Plan  ASA: IV  Anesthesia Plan: General   Post-op Pain Management:    Induction:   PONV Risk Score and Plan: 3 and Treatment may vary due to age or medical condition  Airway Management Planned: Mask  Additional Equipment:   Intra-op Plan:   Post-operative Plan:   Informed Consent: I have reviewed the patients History and Physical, chart, labs and discussed the procedure including the risks, benefits and alternatives for the proposed anesthesia with the patient or authorized representative who has indicated his/her understanding and acceptance.     Dental  Advisory Given  Plan Discussed with: CRNA, Anesthesiologist and Surgeon  Anesthesia Plan Comments:         Anesthesia Quick Evaluation

## 2018-03-14 NOTE — Interval H&P Note (Signed)
History and Physical Interval Note:  03/14/2018 1:26 PM  Joanna [name]  has presented today for surgery, with the diagnosis of AFIB  The various methods of treatment have been discussed with the patient and family. After consideration of risks, benefits and other options for treatment, the patient has consented to  Procedure(s): TRANSESOPHAGEAL ECHOCARDIOGRAM (TEE) (N/A) CARDIOVERSION (N/A) as a surgical intervention .  The patient's history has been reviewed, patient examined, no change in status, stable for surgery.  I have reviewed the patient's chart and labs.  Questions were answered to the patient's satisfaction.     Dietrich Pates

## 2018-03-14 NOTE — CV Procedure (Signed)
TEE  Patient sedated by anesthesia with IV Propofol  TEE probe advanced to mid esophagus without difficulty  LA, LAA without mass No PFO by color doppler TV normal  Tr TR MV is normal  At least moderate MR directed centrally AV is normal PV is normal LVEF and RVEF are severely depressed Mild fixed plaquing of thoracic aorta  Full report to follow

## 2018-03-14 NOTE — Plan of Care (Signed)

## 2018-03-14 NOTE — Care Management (Signed)
#    3.   S/W TED  @ [facility]  SOLUTION RX # [phone]  APIXABAN : NONE FORMULARY  ELIQUIS  5 MG BID COVER- YES CO-PAY- $ 3.90 TIER- NO PRIOR APPROVAL- NO  PREFERRED PHARMACY : YES [facility]   MEDICAID [place] ACCESS EFF-DATE: 02-05-2018 CO-PAY- $ 3.90 FOR EACH PRESCRIPTION

## 2018-03-14 NOTE — Transfer of Care (Signed)
Immediate Anesthesia Transfer of Care Note  Patient: Joanna [name]  Procedure(s) Performed: TRANSESOPHAGEAL ECHOCARDIOGRAM (TEE) (N/A ) CARDIOVERSION (N/A )  Patient Location: Endoscopy Unit  Anesthesia Type:MAC  Level of Consciousness: awake and alert   Airway & Oxygen Therapy: Patient Spontanous Breathing and Patient connected to nasal cannula oxygen  Post-op Assessment: Report given to RN, Post -op Vital signs reviewed and stable and Patient moving all extremities  Post vital signs: Reviewed and stable  Last Vitals:  Vitals Value Taken Time  BP 116/57 03/14/2018  3:15 PM  Temp    Pulse 54 03/14/2018  3:16 PM  Resp 14 03/14/2018  3:16 PM  SpO2 98 % 03/14/2018  3:16 PM  Vitals shown include unvalidated device data.  Last Pain:  Vitals:   03/14/18 1505  TempSrc:   PainSc: 0-No pain      Patients Stated Pain Goal: 0 (07/31/92 8546)  Complications: No apparent anesthesia complications

## 2018-03-14 NOTE — CV Procedure (Signed)
Cardioversion  Pt sedated with IV propofol  With pads in AP position patient cardioverted to SR with 200 J synchronized biphasic energy  Procedure is without complication    12 lead EKG pending

## 2018-03-15 DIAGNOSIS — I340 Nonrheumatic mitral (valve) insufficiency: Secondary | ICD-10-CM

## 2018-03-15 DIAGNOSIS — I5082 Biventricular heart failure: Secondary | ICD-10-CM

## 2018-03-15 DIAGNOSIS — I472 Ventricular tachycardia: Secondary | ICD-10-CM

## 2018-03-15 DIAGNOSIS — I10 Essential (primary) hypertension: Secondary | ICD-10-CM

## 2018-03-15 LAB — BASIC METABOLIC PANEL
Anion gap: 13 (ref 5–15)
BUN: 17 mg/dL (ref 6–20)
CO2: 22 mmol/L (ref 22–32)
Calcium: 8.8 mg/dL — ABNORMAL LOW (ref 8.9–10.3)
Chloride: 103 mmol/L (ref 98–111)
Creatinine, Ser: 0.71 mg/dL (ref 0.44–1.00)
GFR calc Af Amer: >60 mL/min (ref >60)
Glucose, Bld: 105 mg/dL — ABNORMAL HIGH (ref 70–99)
Potassium: 5.4 mmol/L — ABNORMAL HIGH (ref 3.5–5.1)
Sodium: 138 mmol/L (ref 135–145)

## 2018-03-15 LAB — CBC
HCT: 48.1 % — ABNORMAL HIGH (ref 36.0–46.0)
Hemoglobin: 14.3 g/dL (ref 12.0–15.0)
MCH: 27.4 pg (ref 26.0–34.0)
MCHC: 29.7 g/dL — ABNORMAL LOW (ref 30.0–36.0)
MCV: 92.1 fL (ref 80.0–100.0)
Platelets: 241 10*3/uL (ref 150–400)
RBC: 5.22 MIL/uL — ABNORMAL HIGH (ref 3.87–5.11)
RDW: 14.1 % (ref 11.5–15.5)
WBC: 4.9 10*3/uL (ref 4.0–10.5)
nRBC: 0 % (ref 0.0–0.2)

## 2018-03-15 MED ORDER — APIXABAN 5 MG PO TABS: 5.0000 mg | ORAL_TABLET | Freq: Two times a day (BID) | ORAL | 0 refills | Status: DC

## 2018-03-15 MED ORDER — FUROSEMIDE 40 MG PO TABS: 40.0000 mg | ORAL_TABLET | Freq: Every day | ORAL | 0 refills | Status: DC

## 2018-03-15 MED ORDER — SACUBITRIL-VALSARTAN 24-26 MG PO TABS: 1.0000 | ORAL_TABLET | Freq: Two times a day (BID) | ORAL | 0 refills | Status: DC

## 2018-03-15 NOTE — Discharge Summary (Signed)
Physician Discharge Summary  Patient ID: Joanna [name] MRN: [id number] DOB/AGE: [birth year] 58 y.o.  Admit date: 03/08/2018 Discharge date: 03/15/2018  Admission Diagnoses:  Discharge Diagnoses:  Principal Problem:   Atrial fibrillation with RVR (HCC) Active Problems:   Tobacco abuse   Acute systolic (congestive) heart failure (HCC)   Accelerated hypertension   Hyperglycemia   Discharged Condition: stable  Hospital Course:  Patient is a 58 year old female with past medical history significant for hypertension, coronary disease and congestive heart failure.  Patient presented with palpitations, shortness of breath and dyspnea on exertion.  Patient was just discharged from here after being managed for hypertensive emergency/pulmonary edema, was intubated and successfully extubated.  On presentation, patient was found to be in atrial fibrillation with rapid ventricular response.  This is new finding for the patient.  Patient was admitted for further assessment and management.  Cardiology team directed care.  Echocardiogram done revealed severely decreased left ventricular ejection fraction (25 to 30%).  Cardiology team proceeded with TEE and cardioversion.  Patient has maintained normal sinus rhythm, but had 5 beats of nonsustained ventricular tachycardia.  Cardiology team has cleared patient for discharge.  Patient is also eager to be discharged back home.  A. fib with RVR:  Patient was admitted for further assessment and management.   Patient was initially managed with Cardizem drip.   Eliquis was started for anticoagulation, and metoprolol for rate control.   Cardiology team was consulted.   Cardiology team proceeded with TEE and cardioversion.   -Patient is maintaining normal sinus rhythm, but had 5 beats of nonsustained ventricular tachycardia.   -Patient will follow with PCP and cardiology within 1 week of discharge.  Acute on chronic combined CHF: Patient was noncompliant prior to  admission.  Patient will not follow-up with her cardiologist prior to admission.   -Echocardiogram done here showed ejection fraction of 20 to 25% decreased from 40 to 45% as per the echo in 2017.  Also showed grade 2 diastolic dysfunction.   -Patient has been adequately diuresed.  Cardiology team was consulted.  CHF medication is being optimized.  -Follow-up with PCP and cardiology on discharge.  Severe mitral regurgitation/massively dilated left atrial size:  Management as per cardiology.  Continue Lasix  Hypertension:  -Recently admitted for hypertensive urgency and had to be intubated for severe pulmonary edema.   -Continue to monitor blood pressure.   -Prior to discharge was 130/75 mmHg.   Smoker: Counseling provided  Hypokalemia:  Supplemented and corrected Continue to monitor closely.  Consults: cardiology  Significant Diagnostic Studies:  TEE revealed:  "Left Ventricle: The left ventricle has severely reduced systolic function of 25-30%. Right Ventricle: The right ventricle has severely reduced systolic function. Pericardium: A small pericardial effusion is present. Mitral Valve: The mitral valve is normal in structure. Mitral valve regurgitation is moderate by color flow Doppler. Tricuspid Valve: The tricuspid valve was normal in structure. Tricuspid valve regurgitation is trivial by color flow Doppler. Aortic Valve: The aortic valve is tricuspid. Pulmonic Valve: The pulmonic valve was normal in structure. Aorta: There is evidence of plaque in the descending aorta. RA pressure was 3 mmHg".  Treatments: TEE and cardioversion.  Discharge Exam: Blood pressure 130/75, pulse 69, temperature 98 F (36.7 C), temperature source Oral, resp. rate 14, height 5\' 10"  (1.778 m), weight 93.1 kg, SpO2 99 %.  Disposition: Discharge disposition: 01-Home or Self Care   Discharge Instructions    Diet - low sodium heart healthy   Complete by:  As directed  Increase activity  slowly   Complete by:  As directed      Allergies as of 03/15/2018      Reactions   Other Other (See Comments)   Spicy foods- Cause a lot of heartburn      Medication List    STOP taking these medications   BC HEADACHE POWDER PO   lisinopril-hydrochlorothiazide 10-12.5 MG tablet Commonly known as:  PRINZIDE,ZESTORETIC     TAKE these medications   apixaban 5 MG Tabs tablet Commonly known as:  ELIQUIS Take 1 tablet (5 mg total) by mouth 2 (two) times daily.   furosemide 40 MG tablet Commonly known as:  LASIX Take 1 tablet (40 mg total) by mouth daily. Start taking on:  March 16, 2018   metoprolol tartrate 50 MG tablet Commonly known as:  LOPRESSOR Take 1 tablet (50 mg total) by mouth 2 (two) times daily.   sacubitril-valsartan 24-26 MG Commonly known as:  ENTRESTO Take 1 tablet by mouth 2 (two) times daily.      Follow-up Information    [facility], [place], [place]. Go on 04/01/2018.   Specialty:  Cardiology Why:  @8 :30am for hospital follow up  Contact information: [address] [place] [address] [phone]           Signed: Barnetta Chapel 03/15/2018, 1:10 PM

## 2018-03-15 NOTE — Anesthesia Postprocedure Evaluation (Signed)
Anesthesia Post Note  Patient: Joanna [name]  Procedure(s) Performed: TRANSESOPHAGEAL ECHOCARDIOGRAM (TEE) (N/A ) CARDIOVERSION (N/A )     Patient location during evaluation: PACU Anesthesia Type: General Level of consciousness: awake and alert Pain management: pain level controlled Vital Signs Assessment: post-procedure vital signs reviewed and stable Respiratory status: spontaneous breathing, nonlabored ventilation, respiratory function stable and patient connected to nasal cannula oxygen Cardiovascular status: blood pressure returned to baseline and stable Postop Assessment: no apparent nausea or vomiting Anesthetic complications: no    Last Vitals:  Vitals:   03/15/18 0626 03/15/18 0849  BP: 136/83 130/75  Pulse: 69   Resp: 14   Temp: 36.7 C   SpO2: 99%     Last Pain:  Vitals:   03/15/18 0626  TempSrc: Oral  PainSc:                  Oshae Simmering

## 2018-03-15 NOTE — Discharge Instructions (Signed)
Electrical Cardioversion, Care After This sheet gives you information about how to care for yourself after your procedure. Your health care provider may also give you more specific instructions. If you have problems or questions, contact your health care provider. What can I expect after the procedure? After the procedure, it is common to have:  Some redness on the skin where the shocks were given. Follow these instructions at home:   Joanna [name] not drive for 24 hours if you were given a medicine to help you relax (sedative).  Take over-the-counter and prescription medicines only as told by your health care provider.  Ask your health care provider how to check your pulse. Check it often.  Rest for 48 hours after the procedure or as told by your health care provider.  Avoid or limit your caffeine use as told by your health care provider. Contact a health care provider if:  You feel like your heart is beating too quickly or your pulse is not regular.  You have a serious muscle cramp that does not go away. Get help right away if:   You have discomfort in your chest.  You are dizzy or you feel faint.  You have trouble breathing or you are short of breath.  Your speech is slurred.  You have trouble moving an arm or leg on one side of your body.  Your fingers or toes turn cold or blue. This information is not intended to replace advice given to you by your health care provider. Make sure you discuss any questions you have with your health care provider. Document Released: 11/12/2012 Document Revised: 08/26/2015 Document Reviewed: 07/29/2015 Elsevier Interactive Patient Education  2019 [employer]. Information on my medicine - ELIQUIS (apixaban)  This medication education was reviewed with me or my healthcare representative as part of my discharge preparation.  Why was Eliquis prescribed for you? Eliquis was prescribed for you to reduce the risk of a blood clot forming that can  cause a stroke if you have a medical condition called atrial fibrillation (a type of irregular heartbeat).  What Joanna [name] You need to know about Eliquis ? Take your Eliquis TWICE DAILY - one tablet in the morning and one tablet in the evening with or without food. If you have difficulty swallowing the tablet whole please discuss with your pharmacist how to take the medication safely.  Take Eliquis exactly as prescribed by your doctor and Joanna [name] NOT stop taking Eliquis without talking to the doctor who prescribed the medication.  Stopping may increase your risk of developing a stroke.  Refill your prescription before you run out.  After discharge, you should have regular check-up appointments with your healthcare provider that is prescribing your Eliquis.  In the future your dose may need to be changed if your kidney function or weight changes by a significant amount or as you get older.  What Joanna [name] you Joanna [name] if you miss a dose? If you miss a dose, take it as soon as you remember on the same day and resume taking twice daily.  Joanna [name] not take more than one dose of ELIQUIS at the same time to make up a missed dose.  Important Safety Information A possible side effect of Eliquis is bleeding. You should call your healthcare provider right away if you experience any of the following: ? Bleeding from an injury or your nose that does not stop. ? Unusual colored urine (red or dark brown) or unusual colored stools (red or black). ? Unusual bruising  for unknown reasons. ? A serious fall or if you hit your head (even if there is no bleeding).  Some medicines may interact with Eliquis and might increase your risk of bleeding or clotting while on Eliquis. To help avoid this, consult your healthcare provider or pharmacist prior to using any new prescription or non-prescription medications, including herbals, vitamins, non-steroidal anti-inflammatory drugs (NSAIDs) and supplements.  This website has more information on  Eliquis (apixaban): http://www.eliquis.com/eliquis/home

## 2018-03-15 NOTE — Progress Notes (Addendum)
Progress Note  Patient Name: Joanna [name] Date of Encounter: 03/15/2018  Primary Cardiologist: Kristeen Miss, MD   Subjective   Status post TEE and direct-current cardioversion on 03/14/2018.  Denies chest pain, palpitations, and shortness of breath.  Eager to go home.  Several family members present.  Inpatient Medications    Scheduled Meds: . apixaban  5 mg Oral BID  . furosemide  40 mg Oral Daily  . guaiFENesin  600 mg Oral BID  . metoprolol tartrate  50 mg Oral BID  . pantoprazole  40 mg Oral Daily  . sacubitril-valsartan  1 tablet Oral BID  . sodium chloride flush  3 mL Intravenous Q12H   Continuous Infusions: . sodium chloride Stopped (03/13/18 0800)   PRN Meds: acetaminophen, ondansetron (ZOFRAN) IV, sodium chloride flush   Vital Signs    Vitals:   03/14/18 1712 03/14/18 2211 03/15/18 0626 03/15/18 0849  BP: 128/73 127/80 136/83 130/75  Pulse:  60 69   Resp:  13 14   Temp:   98 F (36.7 C)   TempSrc:   Oral   SpO2:   99%   Weight:   93.1 kg   Height:        Intake/Output Summary (Last 24 hours) at 03/15/2018 1156 Last data filed at 03/14/2018 1800 Gross per 24 hour  Intake 200 ml  Output -  Net 200 ml   Filed Weights   03/13/18 0619 03/14/18 0448 03/15/18 0626  Weight: 92.3 kg 93.1 kg 93.1 kg    Telemetry    Sinus rhythm with 5 beat run of NSVT- Personally Reviewed  ECG    No new tracings- Personally Reviewed  Physical Exam   GEN: No acute distress.   Neck: No JVD Cardiac: RRR, no murmurs, rubs, or gallops.  Respiratory: Clear to auscultation bilaterally. GI: Soft, nontender, non-distended  MS: No edema; No deformity. Neuro:  Nonfocal  Psych: Normal affect   Labs    Chemistry Recent Labs  Lab 03/13/18 0428 03/14/18 0436 03/15/18 0512  NA 142 143 138  K 3.8 3.9 5.4*  CL 104 105 103  CO2 26 25 22   GLUCOSE 114* 116* 105*  BUN 16 17 17   CREATININE 0.87 0.87 0.71  CALCIUM 9.4 9.3 8.8*  GFRNONAA >60 >60 >60  GFRAA >60 >60  >60  ANIONGAP 12 13 13      Hematology Recent Labs  Lab 03/10/18 0402 03/11/18 0414 03/15/18 0512  WBC 6.5 6.6 4.9  RBC 4.94 5.01 5.22*  HGB 13.3 13.6 14.3  HCT 44.1 44.2 48.1*  MCV 89.3 88.2 92.1  MCH 26.9 27.1 27.4  MCHC 30.2 30.8 29.7*  RDW 14.6 14.6 14.1  PLT 239 232 241    Cardiac Enzymes Recent Labs  Lab 03/08/18 1527 03/08/18 2127  TROPONINI 0.06* 0.06*   No results for input(s): TROPIPOC in the last 168 hours.   BNPNo results for input(s): BNP, PROBNP in the last 168 hours.   DDimer No results for input(s): DDIMER in the last 168 hours.   Radiology    No results found.  Cardiac Studies   TEE 03/14/2018:  IMPRESSIONS   1. The right ventricle has severely reduced systolic function.  2. Small pericardial effusion.  3. The mitral valve is normal in structure. Mitral valve regurgitation is moderate by color flow Doppler.  4. The tricuspid valve was normal in structure.  5. The aortic valve is tricuspid.  6. The pulmonic valve was normal in structure.  7. No LA appendage  thrombus.  8. No PFO by color doppler.  9. The left ventricle has severely reduced systolic function of 25-30%.   Patient Profile     58 y.o. female with chronic combined CHF and longstanding NICM, normal coronaries 2007, hypertension. She was admitted 02/2018 with hypertensive emergency/pulm edema requiring intubation after having been off her meds for a while. OP f/u recommended but did not occur. She was admitted with worsening chest pressure and DOE, found to have rapid AF RVR, and drop in EF to 20-25% with severe MR and severely elevated RVSP.  Assessment & Plan    1.  New onset rapid atrial fibrillation: Symptomatically stable and maintaining sinus rhythm.  Anticoagulated with Eliquis 5 mg twice daily.  Continue Lopressor 50 mg twice daily.  2.  Acute on chronic combined biventricular congestive heart failure with severe pulmonary hypertension: Euvolemic.  LVEF 25 to 30% by TEE on  03/14/2018.  Right ventricular systolic function is also severely reduced.  Now on Entresto 24-26 mg twice daily along with Lasix 40 mg daily.  Spironolactone to be considered in the outpatient setting.  Had 5 beat run of NSVT but was asymptomatic.  Currently on Lopressor.  Consider switch to carvedilol or Toprol-XL in outpatient setting.  3.  Mitral regurgitation: Moderate by TEE on 03/14/2018 and appears to be functional due to dilated left ventricle.  Will need continued outpatient follow-up.  4.  Hypertension: Blood pressure is normal.  No changes to therapy.  5.  NSVT: Asymptomatic.  Currently on Lopressor.  Disposition: Stable for discharge from my standpoint.   [facility] will sign off.   Medication Recommendations: As above Other recommendations (labs, testing, etc): Will need basic metabolic panel in 1 week Follow up as an outpatient: We will arrange for follow-up with Dr. Elease Hashimoto or APP  For questions or updates, please contact [facility] Please consult [email or web address] for contact info under Cardiology/STEMI.      Signed, Prentice Docker, MD  03/15/2018, 11:56 AM

## 2018-03-17 ENCOUNTER — Encounter: Payer: Self-pay | Admitting: Internal Medicine

## 2018-03-17 ENCOUNTER — Ambulatory Visit: Payer: Medicare Other | Attending: Internal Medicine | Admitting: Internal Medicine

## 2018-03-17 VITALS — BP 174/95 | HR 78 | Temp 98.5°F | Resp 16 | Ht 69.5 in | Wt 210.8 lb

## 2018-03-17 DIAGNOSIS — J441 Chronic obstructive pulmonary disease with (acute) exacerbation: Secondary | ICD-10-CM | POA: Diagnosis not present

## 2018-03-17 DIAGNOSIS — I10 Essential (primary) hypertension: Secondary | ICD-10-CM

## 2018-03-17 DIAGNOSIS — I4819 Other persistent atrial fibrillation: Secondary | ICD-10-CM | POA: Diagnosis not present

## 2018-03-17 DIAGNOSIS — N179 Acute kidney failure, unspecified: Secondary | ICD-10-CM | POA: Insufficient documentation

## 2018-03-17 DIAGNOSIS — I160 Hypertensive urgency: Secondary | ICD-10-CM | POA: Diagnosis not present

## 2018-03-17 DIAGNOSIS — Z79899 Other long term (current) drug therapy: Secondary | ICD-10-CM | POA: Diagnosis not present

## 2018-03-17 DIAGNOSIS — Z7901 Long term (current) use of anticoagulants: Secondary | ICD-10-CM | POA: Diagnosis not present

## 2018-03-17 DIAGNOSIS — R002 Palpitations: Secondary | ICD-10-CM | POA: Insufficient documentation

## 2018-03-17 DIAGNOSIS — I5042 Chronic combined systolic (congestive) and diastolic (congestive) heart failure: Secondary | ICD-10-CM

## 2018-03-17 DIAGNOSIS — Z09 Encounter for follow-up examination after completed treatment for conditions other than malignant neoplasm: Secondary | ICD-10-CM

## 2018-03-17 DIAGNOSIS — I110 Hypertensive heart disease with heart failure: Secondary | ICD-10-CM | POA: Insufficient documentation

## 2018-03-17 DIAGNOSIS — Z8249 Family history of ischemic heart disease and other diseases of the circulatory system: Secondary | ICD-10-CM | POA: Insufficient documentation

## 2018-03-17 DIAGNOSIS — I472 Ventricular tachycardia: Secondary | ICD-10-CM | POA: Diagnosis not present

## 2018-03-17 DIAGNOSIS — Z87891 Personal history of nicotine dependence: Secondary | ICD-10-CM | POA: Insufficient documentation

## 2018-03-17 DIAGNOSIS — I2510 Atherosclerotic heart disease of native coronary artery without angina pectoris: Secondary | ICD-10-CM | POA: Insufficient documentation

## 2018-03-17 DIAGNOSIS — I340 Nonrheumatic mitral (valve) insufficiency: Secondary | ICD-10-CM | POA: Diagnosis not present

## 2018-03-17 DIAGNOSIS — I5043 Acute on chronic combined systolic (congestive) and diastolic (congestive) heart failure: Secondary | ICD-10-CM | POA: Diagnosis not present

## 2018-03-17 MED ORDER — SACUBITRIL-VALSARTAN 24-26 MG PO TABS: 1.0000 | ORAL_TABLET | Freq: Two times a day (BID) | ORAL | 5 refills | Status: DC

## 2018-03-17 MED ORDER — APIXABAN 5 MG PO TABS: 5.0000 mg | ORAL_TABLET | Freq: Two times a day (BID) | ORAL | 6 refills | Status: DC

## 2018-03-17 MED ORDER — FUROSEMIDE 40 MG PO TABS: 40.0000 mg | ORAL_TABLET | Freq: Every day | ORAL | 6 refills | Status: DC

## 2018-03-17 MED ORDER — METOPROLOL TARTRATE 50 MG PO TABS: 50.0000 mg | ORAL_TABLET | Freq: Two times a day (BID) | ORAL | 6 refills | Status: DC

## 2018-03-17 MED FILL — ENTRESTO 24 MG-26 MG TABLET: 24-26 | 30 days supply | Qty: 60 | Fill #0

## 2018-03-17 MED FILL — METOPROLOL TARTRATE 50 MG T: 50 | 30 days supply | Qty: 60 | Fill #0

## 2018-03-17 NOTE — Patient Instructions (Signed)
Please take all of your medications as instructed.  Please keep your appointment with your cardiologist as scheduled for later this month.

## 2018-03-17 NOTE — Progress Notes (Signed)
Pt states she was not able to afford entresto

## 2018-03-17 NOTE — Progress Notes (Signed)
Patient ID: Joanna [name], female    DOB: 06/03/1960  MRN: [id number]  CC: Hospitalization Follow-up   Subjective: Joanna [name] is a 58 y.o. female who presents for hospital follow-up. Her concerns today include:  Patient with history of HTN, COPD, coronary artery disease, systolic and diastolic CHF with EF of 20 to 77%, severe mitral regurg, tobacco dependence  Patient hospitalized 2/1-09/2018 palpitations and dyspnea on exertion.  She was found to be in rapid A. fib.  She was assessed by the cardiology team.  She had TEE that revealed an ejection fraction of 25 to 30%.  Patient underwent cardioversion and was able to maintain normal sinus rhythm but at 5 beats of nonsustained V. tach.  She was also assessed to have acute on chronic combined CHF decompensated.  Patient was diuresed.  Medications were optimized.  She was discharged on furosemide, Eliquis, metoprolol and Entresto.  Patient was unable to afford the [facility].  She is taking all of the other medications.  She denies any further palpitations or shortness of breath.  No PND orthopnea.  No lower extremity edema.  She limits salt in the foods.  She quit smoking after her hospitalization in January where she was intubated.   Patient Active Problem List   Diagnosis Date Noted  . Atrial fibrillation with RVR (HCC) 03/08/2018  . Accelerated hypertension 03/08/2018  . Hyperglycemia 03/08/2018  . Acute pulmonary edema (HCC) 02/08/2018  . Acute systolic (congestive) heart failure (HCC)   . Hypertensive urgency 04/11/2015  . Sepsis (HCC) 04/01/2015  . CAP (community acquired pneumonia) 04/01/2015  . Dehydration 04/01/2015  . AKI (acute kidney injury) (HCC) 04/01/2015  . Acute respiratory failure with hypoxia (HCC) 04/01/2015  . Tobacco abuse 04/01/2015  . Nausea vomiting and diarrhea 04/01/2015  . COPD exacerbation (HCC) 04/01/2015     No current outpatient medications on file prior to visit.   No current  facility-administered medications on file prior to visit.     Allergies  Allergen Reactions  . Other Other (See Comments)    Spicy foods- Cause a lot of heartburn    Social History   Socioeconomic History  . Marital status: Single    Spouse name: Not on file  . Number of children: Not on file  . Years of education: Not on file  . Highest education level: Not on file  Occupational History  . Occupation: unemployed  Social Needs  . Financial resource strain: Not on file  . Food insecurity:    Worry: Not on file    Inability: Not on file  . Transportation needs:    Medical: Not on file    Non-medical: Not on file  Tobacco Use  . Smoking status: Former Smoker    Packs/day: 2.00    Years: 30.00    Pack years: 60.00    Last attempt to quit: 02/08/2018    Years since quitting: 0.1  . Smokeless tobacco: Never Used  Substance and Sexual Activity  . Alcohol use: No    Comment: quit drinking year ago  . Drug use: No  . Sexual activity: Not on file  Lifestyle  . Physical activity:    Days per week: Not on file    Minutes per session: Not on file  . Stress: Not on file  Relationships  . Social connections:    Talks on phone: Not on file    Gets together: Not on file    Attends religious service: Not on file  Active member of club or organization: Not on file    Attends meetings of clubs or organizations: Not on file    Relationship status: Not on file  . Intimate partner violence:    Fear of current or ex partner: Not on file    Emotionally abused: Not on file    Physically abused: Not on file    Forced sexual activity: Not on file  Other Topics Concern  . Not on file  Social History Narrative  . Not on file    Family History  Problem Relation Age of Onset  . Cancer Mother   . Heart disease Father     Past Surgical History:  Procedure Laterality Date  . APPENDECTOMY    . BACK SURGERY      ROS: Review of Systems Negative except as stated  above  PHYSICAL EXAM: BP (!) 174/95   Pulse 78   Temp 98.5 F (36.9 C) (Oral)   Resp 16   Ht 5' 9.5" (1.765 m)   Wt 210 lb 12.8 oz (95.6 kg)   SpO2 98%   BMI 30.68 kg/m   Wt Readings from Last 3 Encounters:  03/17/18 210 lb 12.8 oz (95.6 kg)  03/15/18 205 lb 4.8 oz (93.1 kg)  02/10/18 211 lb 3.2 oz (95.8 kg)    Physical Exam  General appearance - alert, well appearing, pleasant middle-age African-American female and in no distress Mental status - normal mood, behavior, speech, dress, motor activity, and thought processes Mouth - mucous membranes moist, pharynx normal without lesions Neck - supple, no significant adenopathy Chest - clear to auscultation, no wheezes, rales or rhonchi, symmetric air entry Heart -heart rate is irregularly irregular but rate controlled. Extremities -no lower extremity edema   CMP Latest Ref Rng & Units 03/15/2018 03/14/2018 03/13/2018  Glucose 70 - 99 mg/dL 161(W105(H) 960(A116(H) 540(J114(H)  BUN 6 - 20 mg/dL 17 17 16   Creatinine 0.44 - 1.00 mg/dL 8.110.71 9.140.87 7.820.87  Sodium 135 - 145 mmol/L 138 143 142  Potassium 3.5 - 5.1 mmol/L 5.4(H) 3.9 3.8  Chloride 98 - 111 mmol/L 103 105 104  CO2 22 - 32 mmol/L 22 25 26   Calcium 8.9 - 10.3 mg/dL 9.5(A8.8(L) 9.3 9.4  Total Protein 6.5 - 8.1 g/dL - - -  Total Bilirubin 0.3 - 1.2 mg/dL - - -  Alkaline Phos 38 - 126 U/L - - -  AST 15 - 41 U/L - - -  ALT 0 - 44 U/L - - -   Lipid Panel     Component Value Date/Time   CHOL 130 03/09/2018 0407   TRIG 63 03/09/2018 0407   HDL 36 (L) 03/09/2018 0407   CHOLHDL 3.6 03/09/2018 0407   VLDL 13 03/09/2018 0407   LDLCALC 81 03/09/2018 0407    CBC    Component Value Date/Time   WBC 4.9 03/15/2018 0512   RBC 5.22 (H) 03/15/2018 0512   HGB 14.3 03/15/2018 0512   HCT 48.1 (H) 03/15/2018 0512   PLT 241 03/15/2018 0512   MCV 92.1 03/15/2018 0512   MCH 27.4 03/15/2018 0512   MCHC 29.7 (L) 03/15/2018 0512   RDW 14.1 03/15/2018 0512   LYMPHSABS 2.1 03/08/2018 0942   MONOABS 0.4  03/08/2018 0942   EOSABS 0.2 03/08/2018 0942   BASOSABS 0.0 03/08/2018 0942    ASSESSMENT AND PLAN:  1. Hospital discharge follow-up 2. Other persistent atrial fibrillation Encourage medication compliance salt restriction.  She will keep her appointment with the cardiologist  later this month. - apixaban (ELIQUIS) 5 MG TABS tablet; Take 1 tablet (5 mg total) by mouth 2 (two) times daily.  Dispense: 60 tablet; Refill: 6 - metoprolol tartrate (LOPRESSOR) 50 MG tablet; Take 1 tablet (50 mg total) by mouth 2 (two) times daily.  Dispense: 60 tablet; Refill: 6 - furosemide (LASIX) 40 MG tablet; Take 1 tablet (40 mg total) by mouth daily.  Dispense: 30 tablet; Refill: 6  3. CHF (congestive heart failure), NYHA class II, chronic, combined (HCC) We were able to get [employer] for her.  She will need to get this through the patient assistance program - sacubitril-valsartan (ENTRESTO) 24-26 MG; Take 1 tablet by mouth 2 (two) times daily.  Dispense: 60 tablet; Refill: 5 - Basic Metabolic Panel  4. Essential hypertension Not at goal.  However she has been out of [facility] which we were able to fill for her today.  5. Former smoker Encouraged her to remain tobacco free.  Patient was given the opportunity to ask questions.  Patient verbalized understanding of the plan and was able to repeat key elements of the plan.   Orders Placed This Encounter  Procedures  . Basic Metabolic Panel     Requested Prescriptions   Signed Prescriptions Disp Refills  . apixaban (ELIQUIS) 5 MG TABS tablet 60 tablet 6    Sig: Take 1 tablet (5 mg total) by mouth 2 (two) times daily.  . metoprolol tartrate (LOPRESSOR) 50 MG tablet 60 tablet 6    Sig: Take 1 tablet (50 mg total) by mouth 2 (two) times daily.  . furosemide (LASIX) 40 MG tablet 30 tablet 6    Sig: Take 1 tablet (40 mg total) by mouth daily.  . sacubitril-valsartan (ENTRESTO) 24-26 MG 60 tablet 5    Sig: Take 1 tablet by mouth 2 (two) times daily.     Return in about 6 months (around 09/15/2018) for PAP/health maintence exam.  Jonah Blue, MD, FACP

## 2018-03-18 ENCOUNTER — Telehealth: Payer: Self-pay | Admitting: Internal Medicine

## 2018-03-18 ENCOUNTER — Encounter (HOSPITAL_COMMUNITY): Payer: Self-pay | Admitting: Internal Medicine

## 2018-03-18 LAB — BASIC METABOLIC PANEL
BUN/Creatinine Ratio: 26 — ABNORMAL HIGH (ref 9–23)
BUN: 18 mg/dL (ref 6–24)
CO2: 23 mmol/L (ref 20–29)
Calcium: 9.8 mg/dL (ref 8.7–10.2)
Chloride: 104 mmol/L (ref 96–106)
Creatinine, Ser: 0.69 mg/dL (ref 0.57–1.00)
GFR calc Af Amer: 111 mL/min/{1.73_m2} (ref >59)
GFR calc non Af Amer: 96 mL/min/{1.73_m2} (ref >59)
Glucose: 96 mg/dL (ref 65–99)
Potassium: 4.1 mmol/L (ref 3.5–5.2)
Sodium: 146 mmol/L — ABNORMAL HIGH (ref 134–144)

## 2018-03-18 NOTE — Telephone Encounter (Signed)
-----   Message from Marcine Matar, MD sent at 03/17/2018  5:49 PM EST ----- Regarding: Needs 6-week follow-up appointment for Pap and wellness exam.  Not 6 months

## 2018-03-18 NOTE — Telephone Encounter (Signed)
Called the patient and got them scheduled for march 30th

## 2018-03-20 ENCOUNTER — Telehealth: Payer: Self-pay

## 2018-03-20 NOTE — Telephone Encounter (Signed)
Contacted pt to go over lab results pt didn't answer pt phone just kept ringing

## 2018-03-31 NOTE — Progress Notes (Signed)
Cardiology Office Note    Date:  04/01/2018   ID:  Joanna [name], DOB 03-Mar-1960, MRN [id number]  PCP:  Marcine Matar, MD  Cardiologist:  Dr.Nahser   Chief Complaint: Hospital follow up   History of Present Illness:   Joanna [name] is a 58 y.o. female with chronic combined CHF and longstanding NICM, normal coronaries 2007, hypertension presents for hospital follow up.   She was admitted 02/2018 with hypertensive emergency/pulm edema requiring intubation after having been off her meds for a while.She was readmitted with worsening chest pressure and DOE, found to have rapid AF RVR, and drop in EF to 20-25% with severe MR and severely elevated RVSP. Diuresed aggressively. Underwent successful TEE/DCCV. Changed losartan to Entrestro. Plan to add spironolactone as outpatient.   Here today for follow-up.  She has lost 8 pounds since discharge.  She is feeling well however intermittently has some dyspnea and orthopnea.  She is walking approximately 2 miles each day but unusually fatigued and tired.  Endores compliant with medication.  No syncope, palpitation but concern about fluttering sensation.  No lower extremity edema, melena or blood in her urine or stool.  Compliant with low-sodium diet.  Past Medical History:  Diagnosis Date  . Atrial fibrillation with RVR (HCC)   . Chronic combined systolic (congestive) and diastolic (congestive) heart failure (HCC)   . Dyspnea   . Goiter   . Hypertension   . NICM (nonischemic cardiomyopathy) (HCC)    a. 08/2005 Echo: EF 30-35%, mod diff HK. Mild to Mod MR. Mildly dil LA; b. 08/2005 Cath: Nl Cors. Elevated CO w/o shunt; c. 09/2007 Echo: EF 45%. Mild to mod MR; c. 03/2015 Echo: EF 45%, global HK. Gr1 DD. Mild MR. Mildly dil LA.    Past Surgical History:  Procedure Laterality Date  . APPENDECTOMY    . BACK SURGERY    . CARDIOVERSION N/A 03/14/2018   Procedure: CARDIOVERSION;  Surgeon: Pricilla Riffle, MD;  Location: [facility] ENDOSCOPY;  Service:  Cardiovascular;  Laterality: N/A;  . TEE WITHOUT CARDIOVERSION N/A 03/14/2018   Procedure: TRANSESOPHAGEAL ECHOCARDIOGRAM (TEE);  Surgeon: Pricilla Riffle, MD;  Location: [facility] ENDOSCOPY;  Service: Cardiovascular;  Laterality: N/A;    Current Medications: Prior to Admission medications   Medication Sig Start Date End Date Taking? Authorizing Provider  apixaban (ELIQUIS) 5 MG TABS tablet Take 1 tablet (5 mg total) by mouth 2 (two) times daily. 03/17/18   Marcine Matar, MD  furosemide (LASIX) 40 MG tablet Take 1 tablet (40 mg total) by mouth daily. 03/17/18   Marcine Matar, MD  metoprolol tartrate (LOPRESSOR) 50 MG tablet Take 1 tablet (50 mg total) by mouth 2 (two) times daily. 03/17/18   Marcine Matar, MD  sacubitril-valsartan (ENTRESTO) 24-26 MG Take 1 tablet by mouth 2 (two) times daily. 03/17/18   Marcine Matar, MD    Allergies:   Other   Social History   Socioeconomic History  . Marital status: Single    Spouse name: Not on file  . Number of children: Not on file  . Years of education: Not on file  . Highest education level: Not on file  Occupational History  . Occupation: unemployed  Social Needs  . Financial resource strain: Not on file  . Food insecurity:    Worry: Not on file    Inability: Not on file  . Transportation needs:    Medical: Not on file    Non-medical: Not on file  Tobacco Use  .  Smoking status: Former Smoker    Packs/day: 2.00    Years: 30.00    Pack years: 60.00    Last attempt to quit: 02/08/2018    Years since quitting: 0.1  . Smokeless tobacco: Never Used  Substance and Sexual Activity  . Alcohol use: No    Comment: quit drinking year ago  . Drug use: No  . Sexual activity: Not on file  Lifestyle  . Physical activity:    Days per week: Not on file    Minutes per session: Not on file  . Stress: Not on file  Relationships  . Social connections:    Talks on phone: Not on file    Gets together: Not on file    Attends religious  service: Not on file    Active member of club or organization: Not on file    Attends meetings of clubs or organizations: Not on file    Relationship status: Not on file  Other Topics Concern  . Not on file  Social History Narrative  . Not on file     Family History:  The patient's family history includes Cancer in her mother; Heart disease in her father.   ROS:   Please see the history of present illness.    ROS All other systems reviewed and are negative.   PHYSICAL EXAM:   VS:  BP 138/80   Pulse 81   Ht 5' 9.5" (1.765 m)   Wt 212 lb (96.2 kg)   SpO2 99%   BMI 30.86 kg/m    GEN: Well nourished, well developed, in no acute distress  HEENT: normal  Neck: no JVD, carotid bruits, or masses Cardiac: irregularly irregular tachycardic  no murmurs, rubs, or gallops,no edema  Respiratory:  clear to auscultation bilaterally, normal work of breathing GI: soft, nontender, nondistended, + BS MS: no deformity or atrophy  Skin: warm and dry, no rash Neuro:  Alert and Oriented x 3, Strength and sensation are intact Psych: euthymic mood, full affect  Wt Readings from Last 3 Encounters:  04/01/18 212 lb (96.2 kg)  03/17/18 210 lb 12.8 oz (95.6 kg)  03/15/18 205 lb 4.8 oz (93.1 kg)      Studies/Labs Reviewed:   EKG:  EKG is ordered today.  The ekg ordered today demonstrates afib at rate of 145 bpm  Recent Labs: 02/08/2018: ALT 82 03/08/2018: B Natriuretic Peptide 270.4; TSH 1.988 03/09/2018: Magnesium 1.6 03/15/2018: Hemoglobin 14.3; Platelets 241 03/17/2018: BUN 18; Creatinine, Ser 0.69; Potassium 4.1; Sodium 146   Lipid Panel    Component Value Date/Time   CHOL 130 03/09/2018 0407   TRIG 63 03/09/2018 0407   HDL 36 (L) 03/09/2018 0407   CHOLHDL 3.6 03/09/2018 0407   VLDL 13 03/09/2018 0407   LDLCALC 81 03/09/2018 0407    Additional studies/ records that were reviewed today include:   2d echo 03/08/18 IMPRESSIONS 1. The left ventricle has severely reduced systolic function  of 20-25%. The cavity size is severely increased. There is no left ventricular wall thickness. Echo evidence of pseudonormal diastolic filling patterns. 2. Grade 2 diastolic dysfunction. 3. The right ventricle is in size. There is normal systolic function. Right ventricular systolic pressure is severely elevated with an estimated pressure of 59.9 mmHg. 4. Massively dilated left atrial size. 5. Normal right atrial size. 6. Trivial pericardial effusion, as described above. 7. The mitral valve normal in structure. Regurgitation is severe by color flow Doppler. 8. Likely severe mitral regurgitation directed posteriorly and towards  the lateral wall. Coanda effect noted. Likely functional mitral regurgitation due to dilated left ventricle. 9. Normal tricuspid valve. 10. Tricuspid regurgitation moderate. 11. Pulmonic valve regurgitation is mild by color flow Doppler. 12. The inferior vena cava was dilated in size with <50% respiratory variablity. 13. No atrial level shunt detected by color flow Doppler   ASSESSMENT & PLAN:    1. Atrial fibrillation with rapid ventricular rate -Had successful cardioversion in hospital.  However noted in A. fib RVR today.  Minimally symptomatic with intermittent dyspnea and fatigue.  She is walking about 2 miles each day.  No bleeding issue.  Continue Eliquis, compliant.  Reviewed with Dr. Elease Hashimoto.  She has severely dilated left atrium size.  Questionable she will maintain sinus rhythm however she is only 58 yr old.  Will avoid amiodarone.  Increase metoprolol 100 mg twice daily.  No Cardizem given low LV function.  Will refer to EP for antiarrhythmic options (Tikosyn). Digoxin is also an option.   2. Chronic combined CHF with severe pulmonary hypertension: -Euvolemic.  Continue Entresto and Lasix at current dose.  She has lost 8 pounds since discharge.  Check BMET today. Will consider spironolactone based on result.  Suspects tachy mediated cardiomyopathy.  -  Consider repeat echo during follow up   3. Severe MR - likely functional due to dilated LV and LA.  - Repeat echo in few months   Medication Adjustments/Labs and Tests Ordered: Current medicines are reviewed at length with the patient today.  Concerns regarding medicines are outlined above.  Medication changes, Labs and Tests ordered today are listed in the Patient Instructions below. Patient Instructions  Medication Instructions:  Your physician has recommended you make the following change in your medication: 1.  INCREASE the Lopressor to 100 mg taking 1 tablet twice a day .  You may take the Lopressor 50 taking 2 tablets twice a day  If you need a refill on your cardiac medications before your next appointment, please call your pharmacy.   Lab work: TODAY:  BMET  If you have labs (blood work) drawn today and your tests are completely normal, you will receive your results only by: Marland Kitchen MyChart Message (if you have MyChart) OR . A paper copy in the mail If you have any lab test that is abnormal or we need to change your treatment, we will call you to review the results.  Testing/Procedures: None ordered  Follow-Up: Your physician recommends that you schedule a follow-up appointment THIS WEEK FOR NEW PT IN EP OR AFIB CLINIC FOR AFIB WITH RVR  Your physician recommends that you schedule a follow-up appointment in: 3 MONTHS WITH DR. Elease Hashimoto   Any Other Special Instructions Will Be Listed Below (If Applicable).       Vonzella Nipple [place], [place]  04/01/2018 9:18 AM    [facility] [address] [place], [place], [place]  [address] Phone: [phone]; Fax: [phone]

## 2018-04-01 ENCOUNTER — Encounter: Payer: Self-pay | Admitting: Physician Assistant

## 2018-04-01 ENCOUNTER — Ambulatory Visit (INDEPENDENT_AMBULATORY_CARE_PROVIDER_SITE_OTHER): Payer: Medicare Other | Admitting: Physician Assistant

## 2018-04-01 VITALS — BP 138/80 | HR 81 | Ht 69.5 in | Wt 212.0 lb

## 2018-04-01 DIAGNOSIS — I10 Essential (primary) hypertension: Secondary | ICD-10-CM | POA: Diagnosis not present

## 2018-04-01 DIAGNOSIS — I340 Nonrheumatic mitral (valve) insufficiency: Secondary | ICD-10-CM

## 2018-04-01 DIAGNOSIS — I4891 Unspecified atrial fibrillation: Secondary | ICD-10-CM | POA: Diagnosis not present

## 2018-04-01 DIAGNOSIS — I5021 Acute systolic (congestive) heart failure: Secondary | ICD-10-CM | POA: Diagnosis not present

## 2018-04-01 DIAGNOSIS — R0602 Shortness of breath: Secondary | ICD-10-CM | POA: Diagnosis not present

## 2018-04-01 DIAGNOSIS — I5042 Chronic combined systolic (congestive) and diastolic (congestive) heart failure: Secondary | ICD-10-CM

## 2018-04-01 LAB — BASIC METABOLIC PANEL
BUN/Creatinine Ratio: 21 (ref 9–23)
BUN: 22 mg/dL (ref 6–24)
CO2: 19 mmol/L — AB (ref 20–29)
Calcium: 10.2 mg/dL (ref 8.7–10.2)
Chloride: 104 mmol/L (ref 96–106)
Creatinine, Ser: 1.03 mg/dL — ABNORMAL HIGH (ref 0.57–1.00)
GFR calc Af Amer: 69 mL/min/{1.73_m2} (ref >59)
GFR calc non Af Amer: 60 mL/min/{1.73_m2} (ref >59)
Glucose: 122 mg/dL — ABNORMAL HIGH (ref 65–99)
POTASSIUM: 5.2 mmol/L (ref 3.5–5.2)
Sodium: 143 mmol/L (ref 134–144)

## 2018-04-01 MED ORDER — METOPROLOL TARTRATE 100 MG PO TABS: 100.0000 mg | ORAL_TABLET | Freq: Two times a day (BID) | ORAL | 3 refills | Status: DC

## 2018-04-01 MED FILL — METOPROLOL TARTRATE 100 MG: 100 | 30 days supply | Qty: 60 | Fill #0

## 2018-04-01 NOTE — Patient Instructions (Addendum)
Medication Instructions:  Your physician has recommended you make the following change in your medication: 1.  INCREASE the Lopressor to 100 mg taking 1 tablet twice a day .  You may take the Lopressor 50 taking 2 tablets twice a day  If you need a refill on your cardiac medications before your next appointment, please call your pharmacy.   Lab work: TODAY:  BMET  If you have labs (blood work) drawn today and your tests are completely normal, you will receive your results only by: Marland Kitchen MyChart Message (if you have MyChart) OR . A paper copy in the mail If you have any lab test that is abnormal or we need to change your treatment, we will call you to review the results.  Testing/Procedures: None ordered  Follow-Up: Your physician recommends that you schedule a follow-up appointment THIS WEEK FOR NEW PT IN EP OR AFIB CLINIC FOR AFIB WITH RVR  Your physician recommends that you schedule a follow-up appointment in: 3 MONTHS WITH DR. Elease Hashimoto   Any Other Special Instructions Will Be Listed Below (If Applicable).

## 2018-04-03 ENCOUNTER — Ambulatory Visit (HOSPITAL_COMMUNITY)
Admission: RE | Admit: 2018-04-03 | Discharge: 2018-04-03 | Disposition: A | Discharge status: Home / Self Care | Payer: Medicare Other | Source: Ambulatory Visit | Attending: Physician Assistant | Admitting: Physician Assistant

## 2018-04-03 ENCOUNTER — Other Ambulatory Visit (HOSPITAL_COMMUNITY): Payer: Self-pay | Admitting: *Deleted

## 2018-04-03 ENCOUNTER — Encounter (HOSPITAL_COMMUNITY): Payer: Self-pay | Admitting: Physician Assistant

## 2018-04-03 VITALS — BP 100/62 | HR 128 | Ht 69.5 in | Wt 210.4 lb

## 2018-04-03 DIAGNOSIS — I428 Other cardiomyopathies: Secondary | ICD-10-CM | POA: Diagnosis not present

## 2018-04-03 DIAGNOSIS — Z8249 Family history of ischemic heart disease and other diseases of the circulatory system: Secondary | ICD-10-CM | POA: Diagnosis not present

## 2018-04-03 DIAGNOSIS — E669 Obesity, unspecified: Secondary | ICD-10-CM | POA: Diagnosis not present

## 2018-04-03 DIAGNOSIS — I4819 Other persistent atrial fibrillation: Secondary | ICD-10-CM

## 2018-04-03 DIAGNOSIS — Z7901 Long term (current) use of anticoagulants: Secondary | ICD-10-CM | POA: Insufficient documentation

## 2018-04-03 DIAGNOSIS — Z87891 Personal history of nicotine dependence: Secondary | ICD-10-CM | POA: Insufficient documentation

## 2018-04-03 DIAGNOSIS — Z79899 Other long term (current) drug therapy: Secondary | ICD-10-CM | POA: Diagnosis not present

## 2018-04-03 DIAGNOSIS — I110 Hypertensive heart disease with heart failure: Secondary | ICD-10-CM | POA: Insufficient documentation

## 2018-04-03 DIAGNOSIS — Z6830 Body mass index (BMI) 30.0-30.9, adult: Secondary | ICD-10-CM | POA: Insufficient documentation

## 2018-04-03 DIAGNOSIS — I5042 Chronic combined systolic (congestive) and diastolic (congestive) heart failure: Secondary | ICD-10-CM | POA: Diagnosis not present

## 2018-04-03 LAB — COMPREHENSIVE METABOLIC PANEL
ALT: 16 U/L (ref 0–44)
ANION GAP: 10 (ref 5–15)
AST: 18 U/L (ref 15–41)
Albumin: 4 g/dL (ref 3.5–5.0)
Alkaline Phosphatase: 63 U/L (ref 38–126)
BUN: 23 mg/dL — ABNORMAL HIGH (ref 6–20)
CO2: 24 mmol/L (ref 22–32)
Calcium: 9.6 mg/dL (ref 8.9–10.3)
Chloride: 109 mmol/L (ref 98–111)
Creatinine, Ser: 1.07 mg/dL — ABNORMAL HIGH (ref 0.44–1.00)
GFR calc non Af Amer: 57 mL/min — ABNORMAL LOW (ref >60)
Glucose, Bld: 126 mg/dL — ABNORMAL HIGH (ref 70–99)
POTASSIUM: 3.9 mmol/L (ref 3.5–5.1)
Sodium: 143 mmol/L (ref 135–145)
Total Bilirubin: 1.1 mg/dL (ref 0.3–1.2)
Total Protein: 8.5 g/dL — ABNORMAL HIGH (ref 6.5–8.1)

## 2018-04-03 MED ORDER — AMIODARONE HCL 200 MG PO TABS: 200.0000 mg | ORAL_TABLET | Freq: Two times a day (BID) | ORAL | 0 refills | Status: DC

## 2018-04-03 MED ORDER — FUROSEMIDE 40 MG PO TABS: 20.0000 mg | ORAL_TABLET | Freq: Every day | ORAL | 6 refills | Status: DC

## 2018-04-03 MED FILL — AMIODARONE HCL 200 MG TAB: 200 | 30 days supply | Qty: 60 | Fill #0

## 2018-04-03 NOTE — Progress Notes (Signed)
Primary Care Physician: Marcine Matar, MD Primary Cardiologist: Dr Elease Hashimoto Primary Electrophysiologist: Dr Elberta Fortis Referring Physician: Manson Passey PA   Joanna [name] is a 58 y.o. female with a history of HTN, combined chronic systolic and diastolic CHF, tobacco abuse, MR, and persistent atrial fibrillation who presents for consultation in the [facility].  The patient was initially diagnosed with atrial fibrillation on 03/08/18 at the ER after presenting with symptoms of dyspnea on exertion for one week prior. ECG at ER showed afib with RVR and she was given Lopressor and started on Cardizem drip and heparin. She underwent successful DCCV. Echo on admission showed severely reduced EF 20-25%, possibly tachycardia mediated. Discharged on Eliquis and Lopressor. On follow up, she was noted to be in afib again with RVR. She notes mre SOB and decreased exercise tolerance when she is out of rhythm. She is in afib today.  Today, she denies symptoms of chest pain, orthopnea, PND, lower extremity edema, dizziness, presyncope, syncope, snoring, daytime somnolence, bleeding, or neurologic sequela. The patient is tolerating medications without difficulties and is otherwise without complaint today.    Atrial Fibrillation Risk Factors:  she does not have symptoms or diagnosis of sleep apnea. she does not have a history of rheumatic fever. she does not have a history of alcohol use. The patient does not have a history of early familial atrial fibrillation or other arrhythmias.  she has a BMI of Body mass index is 30.63 kg/m.Marland Kitchen Filed Weights   04/03/18 0952  Weight: 95.4 kg    Family History  Problem Relation Age of Onset  . Cancer Mother   . Heart disease Father      Atrial Fibrillation Management history:  Previous antiarrhythmic drugs: none Previous cardioversions: 03/14/2018 Previous ablations: none CHADS2VASC score: 3 (female, HTN,  CHF) Anticoagulation history: Eliquis   Past Medical History:  Diagnosis Date  . Atrial fibrillation with RVR (HCC)   . Chronic combined systolic (congestive) and diastolic (congestive) heart failure (HCC)   . Dyspnea   . Goiter   . Hypertension   . NICM (nonischemic cardiomyopathy) (HCC)    a. 08/2005 Echo: EF 30-35%, mod diff HK. Mild to Mod MR. Mildly dil LA; b. 08/2005 Cath: Nl Cors. Elevated CO w/o shunt; c. 09/2007 Echo: EF 45%. Mild to mod MR; c. 03/2015 Echo: EF 45%, global HK. Gr1 DD. Mild MR. Mildly dil LA.   Past Surgical History:  Procedure Laterality Date  . APPENDECTOMY    . BACK SURGERY    . CARDIOVERSION N/A 03/14/2018   Procedure: CARDIOVERSION;  Surgeon: Pricilla Riffle, MD;  Location: [facility] ENDOSCOPY;  Service: Cardiovascular;  Laterality: N/A;  . TEE WITHOUT CARDIOVERSION N/A 03/14/2018   Procedure: TRANSESOPHAGEAL ECHOCARDIOGRAM (TEE);  Surgeon: Pricilla Riffle, MD;  Location: [facility] ENDOSCOPY;  Service: Cardiovascular;  Laterality: N/A;    Current Outpatient Medications  Medication Sig Dispense Refill  . apixaban (ELIQUIS) 5 MG TABS tablet Take 1 tablet (5 mg total) by mouth 2 (two) times daily. 60 tablet 6  . furosemide (LASIX) 40 MG tablet Take 1 tablet (40 mg total) by mouth daily. 30 tablet 6  . metoprolol tartrate (LOPRESSOR) 100 MG tablet Take 1 tablet (100 mg total) by mouth 2 (two) times daily. 180 tablet 3  . sacubitril-valsartan (ENTRESTO) 24-26 MG Take 1 tablet by mouth 2 (two) times daily. 60 tablet 5   No current facility-administered medications for this encounter.     Allergies  Allergen Reactions  .  Other Other (See Comments)    Spicy foods- Cause a lot of heartburn    Social History   Socioeconomic History  . Marital status: Single    Spouse name: Not on file  . Number of children: Not on file  . Years of education: Not on file  . Highest education level: Not on file  Occupational History  . Occupation: unemployed  Social Needs  . Financial  resource strain: Not on file  . Food insecurity:    Worry: Not on file    Inability: Not on file  . Transportation needs:    Medical: Not on file    Non-medical: Not on file  Tobacco Use  . Smoking status: Former Smoker    Packs/day: 2.00    Years: 30.00    Pack years: 60.00    Last attempt to quit: 02/08/2018    Years since quitting: 0.1  . Smokeless tobacco: Never Used  Substance and Sexual Activity  . Alcohol use: No    Comment: quit drinking year ago  . Drug use: No  . Sexual activity: Not on file  Lifestyle  . Physical activity:    Days per week: Not on file    Minutes per session: Not on file  . Stress: Not on file  Relationships  . Social connections:    Talks on phone: Not on file    Gets together: Not on file    Attends religious service: Not on file    Active member of club or organization: Not on file    Attends meetings of clubs or organizations: Not on file    Relationship status: Not on file  . Intimate partner violence:    Fear of current or ex partner: Not on file    Emotionally abused: Not on file    Physically abused: Not on file    Forced sexual activity: Not on file  Other Topics Concern  . Not on file  Social History Narrative  . Not on file     ROS- All systems are reviewed and negative except as per the HPI above.  Physical Exam: Vitals:   04/03/18 0952  BP: 100/62  Pulse: (!) 128  SpO2: 98%  Weight: 95.4 kg  Height: 5' 9.5" (1.765 m)    GEN- The patient is well appearing, alert and oriented x 3 today.   Head- normocephalic, atraumatic Eyes-  Sclera clear, conjunctiva pink Ears- hearing intact Oropharynx- clear Neck- supple  Lungs- Clear to ausculation bilaterally, normal work of breathing Heart- irregular rate and rhythm, no murmurs, rubs or gallops  GI- soft, NT, ND, + BS Extremities- no clubbing, cyanosis, or edema MS- no significant deformity or atrophy Skin- no rash or lesion Psych- euthymic mood, full affect Neuro-  strength and sensation are intact  Wt Readings from Last 3 Encounters:  04/03/18 95.4 kg  04/01/18 96.2 kg  03/17/18 95.6 kg    EKG today demonstrates afib HR 128, QRS 94, QTc 513 (470 in SR 2017)  Echo 03/08/18 demonstrated  1. The left ventricle has severely reduced systolic function of 20-25%. The cavity size is severely increased. There is no left ventricular wall thickness. Echo evidence of pseudonormal diastolic filling patterns.  2. Grade 2 diastolic dysfunction.  3. The right ventricle is in size. There is normal systolic function. Right ventricular systolic pressure is severely elevated with an estimated pressure of 59.9 mmHg.  4. Massively dilated left atrial size.  5. Normal right atrial size.  6. Trivial  pericardial effusion, as described above.  7. The mitral valve normal in structure. Regurgitation is severe by color flow Doppler.  8. Likely severe mitral regurgitation directed posteriorly and towards the lateral wall. Coanda effect noted. Likely functional mitral regurgitation due to dilated left ventricle.  9. Normal tricuspid valve. 10. Tricuspid regurgitation moderate. 11. Pulmonic valve regurgitation is mild by color flow Doppler. 12. The inferior vena cava was dilated in size with <50% respiratory variablity. 13. No atrial level shunt detected by color flow Doppler.  Epic records are reviewed at length today  Assessment and Plan:  1. Persistent atrial fibrillation The patient has persistent atrial fibrillation. Massively dilated LA noted on echo.  Given variable EF and chronic CHF, would avoid class Ic and Multaq. QTc 470 in SR in 2017 so Tikosyn may not be a good option either. Amiodarone may be only AAD despite younger age. No room in BP to titrate rate control Will start amiodarone 200 mg BID for one week, decrease to 200 mg daily thereafter. Once loaded, would consider repeat DCCV. Continue Eliquis 5 mg BID Check Cmet today. Recent TSH normal.  This patients  CHA2DS2-VASc Score and unadjusted Ischemic Stroke Rate (% per year) is equal to 3.2 % stroke rate/year from a score of 3  Above score calculated as 1 point each if present [CHF, HTN, DM, Vascular=MI/PAD/Aortic Plaque, Age if 65-74, or Female] Above score calculated as 2 points each if present [Age > 75, or Stroke/TIA/TE]   2. Obesity Body mass index is 30.63 kg/m. Lifestyle modification was discussed at length including regular exercise and weight reduction.  3. Combined chronic systolic and diastolic CHF No signs of fluid overload.  Continue Entresto. Cut back on Lasix to 20 mg daily.  4. HTN BP borderline low today. Will cut back on Lasix to accommodate Entresto and rate control.   Follow up in Afib clinic in one week for ECG.  Jorja Loa PA-C Afib Clinic [facility] [address] [place], [place] [address] [phone] 04/03/2018 10:58 AM

## 2018-04-10 ENCOUNTER — Encounter (HOSPITAL_COMMUNITY): Payer: Self-pay | Admitting: Physician Assistant

## 2018-04-10 ENCOUNTER — Ambulatory Visit (HOSPITAL_COMMUNITY)
Admission: RE | Admit: 2018-04-10 | Discharge: 2018-04-10 | Disposition: A | Discharge status: Home / Self Care | Payer: Medicare Other | Source: Ambulatory Visit | Attending: Physician Assistant | Admitting: Physician Assistant

## 2018-04-10 VITALS — BP 138/74 | HR 107

## 2018-04-10 DIAGNOSIS — I4891 Unspecified atrial fibrillation: Secondary | ICD-10-CM | POA: Insufficient documentation

## 2018-04-10 DIAGNOSIS — I5021 Acute systolic (congestive) heart failure: Secondary | ICD-10-CM | POA: Insufficient documentation

## 2018-04-10 DIAGNOSIS — J441 Chronic obstructive pulmonary disease with (acute) exacerbation: Secondary | ICD-10-CM

## 2018-04-10 DIAGNOSIS — I160 Hypertensive urgency: Secondary | ICD-10-CM | POA: Diagnosis not present

## 2018-04-10 DIAGNOSIS — I4819 Other persistent atrial fibrillation: Secondary | ICD-10-CM

## 2018-04-10 DIAGNOSIS — Z87891 Personal history of nicotine dependence: Secondary | ICD-10-CM | POA: Diagnosis not present

## 2018-04-10 DIAGNOSIS — R739 Hyperglycemia, unspecified: Secondary | ICD-10-CM | POA: Diagnosis not present

## 2018-04-10 DIAGNOSIS — I10 Essential (primary) hypertension: Secondary | ICD-10-CM

## 2018-04-10 MED ORDER — FUROSEMIDE 40 MG PO TABS: 20.0000 mg | ORAL_TABLET | Freq: Every day | ORAL | 6 refills | Status: DC

## 2018-04-10 MED ORDER — APIXABAN 5 MG PO TABS: 5.0000 mg | ORAL_TABLET | Freq: Two times a day (BID) | ORAL | 6 refills | Status: DC

## 2018-04-10 NOTE — Progress Notes (Addendum)
Pt in for EKG for being started on amiodarone. She feels better even though she continues in afib, rate better controlled at 107 bpm. Qc at 475 ms. She will continue on 200 mg bid and will see back in 2 weeks to get ready for cardioversion.

## 2018-04-11 MED FILL — ENTRESTO 24 MG-26 MG TABLET: 24-26 | 30 days supply | Qty: 60 | Fill #1

## 2018-04-24 ENCOUNTER — Ambulatory Visit (HOSPITAL_COMMUNITY): Payer: Medicare Other | Admitting: Physician Assistant

## 2018-04-30 ENCOUNTER — Other Ambulatory Visit (HOSPITAL_COMMUNITY): Payer: Self-pay | Admitting: *Deleted

## 2018-04-30 MED ORDER — METOPROLOL TARTRATE 100 MG PO TABS: 100.0000 mg | ORAL_TABLET | Freq: Two times a day (BID) | ORAL | 3 refills | Status: DC

## 2018-04-30 MED ORDER — AMIODARONE HCL 200 MG PO TABS: 200.0000 mg | ORAL_TABLET | Freq: Every day | ORAL | 2 refills | Status: DC

## 2018-05-01 MED FILL — METOPROLOL TARTRATE 100 MG: 100 | 90 days supply | Qty: 180 | Fill #0

## 2018-05-01 MED FILL — AMIODARONE HCL 200 MG TAB: 200 | 90 days supply | Qty: 90 | Fill #0

## 2018-05-05 ENCOUNTER — Other Ambulatory Visit: Payer: Medicare Other | Admitting: Internal Medicine

## 2018-05-08 ENCOUNTER — Telehealth (HOSPITAL_COMMUNITY): Payer: Self-pay | Admitting: *Deleted

## 2018-05-08 ENCOUNTER — Other Ambulatory Visit: Payer: Self-pay

## 2018-05-08 DIAGNOSIS — I4819 Other persistent atrial fibrillation: Secondary | ICD-10-CM

## 2018-05-08 DIAGNOSIS — I5042 Chronic combined systolic (congestive) and diastolic (congestive) heart failure: Secondary | ICD-10-CM

## 2018-05-08 MED ORDER — FUROSEMIDE 40 MG PO TABS: 20.0000 mg | ORAL_TABLET | Freq: Every day | ORAL | 2 refills | Status: DC

## 2018-05-08 MED ORDER — APIXABAN 5 MG PO TABS: 5.0000 mg | ORAL_TABLET | Freq: Two times a day (BID) | ORAL | 2 refills | Status: DC

## 2018-05-08 MED ORDER — SACUBITRIL-VALSARTAN 24-26 MG PO TABS: 1.0000 | ORAL_TABLET | Freq: Two times a day (BID) | ORAL | 5 refills | Status: DC

## 2018-05-09 NOTE — Telephone Encounter (Signed)
rx sent per pt request

## 2018-05-17 MED FILL — ENTRESTO 24 MG-26 MG TABLET: 24-26 | 30 days supply | Qty: 60 | Fill #2

## 2018-05-21 ENCOUNTER — Ambulatory Visit (HOSPITAL_COMMUNITY)
Admission: RE | Admit: 2018-05-21 | Discharge: 2018-05-21 | Disposition: A | Discharge status: Home / Self Care | Payer: Medicare Other | Source: Ambulatory Visit | Attending: Physician Assistant | Admitting: Physician Assistant

## 2018-05-21 ENCOUNTER — Other Ambulatory Visit: Payer: Self-pay

## 2018-05-21 DIAGNOSIS — I4819 Other persistent atrial fibrillation: Secondary | ICD-10-CM | POA: Diagnosis not present

## 2018-05-21 NOTE — Progress Notes (Signed)
Electrophysiology TeleHealth Note   Due to national recommendations of social distancing due to COVID 19, Audio telehealth visit is felt to be most appropriate for this patient at this time.  See consent below from today for patient consent regarding telehealth for the Atrial Fibrillation Clinic. Consent obtained verbally.   Date:  05/21/2018   ID:  Joanna [name], DOB May 22, 1960, MRN [id number]  Location: home  Provider location: [address] [place], [place] [address] Evaluation Performed: Follow up  PCP:  Marcine Matar, MD  Primary Cardiologist:  Dr Elease Hashimoto Primary Electrophysiologist: Dr Elberta Fortis   CC: Follow up for atrial fibrillation   History of Present Illness: Joanna [name] is a 58 y.o. female who presents via audio conferencing for a telehealth visit today. Patient reports that she is "doing great" since starting the amiodarone. She has not had any further SOB and she is walking on a regular basis for exercise. She denies any symptoms of heart racing or palpitations or fluid overload.   Today, she denies symptoms of palpitations, chest pain, shortness of breath, orthopnea, PND, lower extremity edema, claudication, dizziness, presyncope, syncope, bleeding, or neurologic sequela. The patient is tolerating medications without difficulties and is otherwise without complaint today.   she denies symptoms of cough, fevers, chills, or new SOB worrisome for COVID 19.     Atrial Fibrillation Risk Factors:  she does not have symptoms or diagnosis of sleep apnea. she does not have a history of rheumatic fever. she does not have a history of alcohol use. The patient does not have a history of early familial atrial fibrillation or other arrhythmias.  she has a BMI of There is no height or weight on file to calculate BMI.. There were no vitals filed for this visit.  Past Medical History:  Diagnosis Date   Atrial fibrillation with RVR (HCC)    Chronic combined systolic  (congestive) and diastolic (congestive) heart failure (HCC)    Dyspnea    Goiter    Hypertension    NICM (nonischemic cardiomyopathy) (HCC)    a. 08/2005 Echo: EF 30-35%, mod diff HK. Mild to Mod MR. Mildly dil LA; b. 08/2005 Cath: Nl Cors. Elevated CO w/o shunt; c. 09/2007 Echo: EF 45%. Mild to mod MR; c. 03/2015 Echo: EF 45%, global HK. Gr1 DD. Mild MR. Mildly dil LA.   Past Surgical History:  Procedure Laterality Date   APPENDECTOMY     BACK SURGERY     CARDIOVERSION N/A 03/14/2018   Procedure: CARDIOVERSION;  Surgeon: Pricilla Riffle, MD;  Location: [facility] ENDOSCOPY;  Service: Cardiovascular;  Laterality: N/A;   TEE WITHOUT CARDIOVERSION N/A 03/14/2018   Procedure: TRANSESOPHAGEAL ECHOCARDIOGRAM (TEE);  Surgeon: Pricilla Riffle, MD;  Location: [facility] ENDOSCOPY;  Service: Cardiovascular;  Laterality: N/A;     Current Outpatient Medications  Medication Sig Dispense Refill   amiodarone (PACERONE) 200 MG tablet Take 1 tablet (200 mg total) by mouth daily. 90 tablet 2   apixaban (ELIQUIS) 5 MG TABS tablet Take 1 tablet (5 mg total) by mouth 2 (two) times daily. 180 tablet 2   furosemide (LASIX) 40 MG tablet Take 0.5 tablets (20 mg total) by mouth daily. 60 tablet 2   metoprolol tartrate (LOPRESSOR) 100 MG tablet Take 1 tablet (100 mg total) by mouth 2 (two) times daily. 180 tablet 3   sacubitril-valsartan (ENTRESTO) 24-26 MG Take 1 tablet by mouth 2 (two) times daily. 60 tablet 5   No current facility-administered medications for this encounter.  Allergies:   Other   Social History:  The patient  reports that she quit smoking about 3 months ago. She has a 60.00 pack-year smoking history. She has never used smokeless tobacco. She reports that she does not drink alcohol or use drugs.   Family History:  The patient's  family history includes Cancer in her mother; Heart disease in her father.    ROS:  Please see the history of present illness.   All other systems are personally reviewed  and negative.   Recent Labs: 03/08/2018: B Natriuretic Peptide 270.4; TSH 1.988 03/09/2018: Magnesium 1.6 03/15/2018: Hemoglobin 14.3; Platelets 241 04/03/2018: ALT 16; BUN 23; Creatinine, Ser 1.07; Potassium 3.9; Sodium 143  personally reviewed    Other studies personally reviewed: Additional studies/ records that were reviewed today include: Epic notes   ASSESSMENT AND PLAN:  1. Persistent atrial fibrillation Patient loaded on amiodarone and feeling much better symptomatically. Possible she converted to SR on amio. Plan had been to arrange DCCV after loading with amiodarone but due to COVID-19 we will delay this for now as patient is stable with controlled rates and no symptoms of decompensated CHF. Certainly if she develops significant symptoms again we can arrange urgent DCCV. Continue amiodarone 200 mg daily Continue Eliquis 5 mg BID. Patient reports no missed doses.  This patients CHA2DS2-VASc Score and unadjusted Ischemic Stroke Rate (% per year) is equal to 3.2 % stroke rate/year from a score of 3  Above score calculated as 1 point each if present [CHF, HTN, DM, Vascular=MI/PAD/Aortic Plaque, Age if 65-74, or Female] Above score calculated as 2 points each if present [Age > 75, or Stroke/TIA/TE]  2. Obesity Lifestyle modification was discussed and encouraged including regular physical activity and weight reduction. Patient reports she has been walking regularly.  3. Combined chronic systolic and diastolic CHF Patient reports no symptoms of fluid overload even on lower dose of lasix. Continue present therapy.  4. HTN Stable, no changes today.   COVID screen The patient does not have any symptoms that suggest any further testing/ screening at this time.  Social distancing reinforced today.    Follow-up with Dr Elease Hashimoto as scheduled. AF clinic in 4 months.  Current medicines are reviewed at length with the patient today.   The patient does not have concerns regarding her  medicines.  The following changes were made today:  none  Labs/ tests ordered today include:  No orders of the defined types were placed in this encounter.   Patient Risk:  after full review of this patients clinical status, I feel that they are at moderate risk at this time.   Today, I have spent 21 minutes with the patient with telehealth technology discussing atrial fibrillation, amiodarone, and COVID-19 precautions.    Dalia Heading PA-C 05/21/2018 3:28 PM  Afib Clinic [facility] [address] [place], [place] [address] [phone]   I hereby voluntarily request, consent and authorize the Atrial Fibrillation Clinic and its employed or contracted physicians, physician assistants, nurse practitioners or other licensed health care professionals (the Practitioner), to provide me with telemedicine health care services (the "Services") as deemed necessary by the treating Practitioner. I acknowledge and consent to receive the Services by the Practitioner via telemedicine. I understand that the telemedicine visit will involve communicating with the Practitioner through live audiovisual communication technology and the disclosure of certain medical information by electronic transmission. I acknowledge that I have been given the opportunity to request an in-person assessment or other  available alternative prior to the telemedicine visit and am voluntarily participating in the telemedicine visit.   I understand that I have the right to withhold or withdraw my consent to the use of telemedicine in the course of my care at any time, without affecting my right to future care or treatment, and that the Practitioner or I may terminate the telemedicine visit at any time. I understand that I have the right to inspect all information obtained and/or recorded in the course of the telemedicine visit and may receive copies of available information for a reasonable fee.  I understand that  some of the potential risks of receiving the Services via telemedicine include:   Delay or interruption in medical evaluation due to technological equipment failure or disruption;  Information transmitted may not be sufficient (e.g. poor resolution of images) to allow for appropriate medical decision making by the Practitioner; and/or  In rare instances, security protocols could fail, causing a breach of personal health information.   Furthermore, I acknowledge that it is my responsibility to provide information about my medical history, conditions and care that is complete and accurate to the best of my ability. I acknowledge that Practitioner's advice, recommendations, and/or decision may be based on factors not within their control, such as incomplete or inaccurate data provided by me or distortions of diagnostic images or specimens that may result from electronic transmissions. I understand that the practice of medicine is not an exact science and that Practitioner makes no warranties or guarantees regarding treatment outcomes. I acknowledge that I will receive a copy of this consent concurrently upon execution via email to the email address I last provided but may also request a printed copy by calling the office of the Atrial Fibrillation Clinic.  I understand that my insurance will be billed for this visit.   I have read or had this consent read to me.  I understand the contents of this consent, which adequately explains the benefits and risks of the Services being provided via telemedicine.  I have been provided ample opportunity to ask questions regarding this consent and the Services and have had my questions answered to my satisfaction.  I give my informed consent for the services to be provided through the use of telemedicine in my medical care  By participating in this telemedicine visit I agree to the above.

## 2018-06-16 ENCOUNTER — Telehealth (HOSPITAL_COMMUNITY): Payer: Self-pay | Admitting: *Deleted

## 2018-06-16 DIAGNOSIS — I4819 Other persistent atrial fibrillation: Secondary | ICD-10-CM

## 2018-06-16 MED ORDER — FUROSEMIDE 20 MG PO TABS: 20.0000 mg | ORAL_TABLET | Freq: Every day | ORAL | 2 refills | Status: DC

## 2018-06-16 NOTE — Telephone Encounter (Signed)
Pt has refills at his pharmacy as requested.

## 2018-06-18 MED FILL — ENTRESTO 24 MG-26 MG TABLET: 24-26 | 30 days supply | Qty: 60 | Fill #3

## 2018-07-01 ENCOUNTER — Other Ambulatory Visit: Payer: Medicare Other | Admitting: Internal Medicine

## 2018-07-08 ENCOUNTER — Telehealth: Payer: Self-pay

## 2018-07-08 NOTE — Telephone Encounter (Signed)
Attempted to call pt about upcoming appt and switching to virtual visit, there was no answer and only a busy signal.

## 2018-07-10 ENCOUNTER — Other Ambulatory Visit (HOSPITAL_COMMUNITY): Payer: Self-pay | Admitting: *Deleted

## 2018-07-10 DIAGNOSIS — I4819 Other persistent atrial fibrillation: Secondary | ICD-10-CM

## 2018-07-10 MED ORDER — METOPROLOL TARTRATE 100 MG PO TABS: 100.0000 mg | ORAL_TABLET | Freq: Two times a day (BID) | ORAL | 3 refills | Status: DC

## 2018-07-10 MED ORDER — APIXABAN 5 MG PO TABS: 5.0000 mg | ORAL_TABLET | Freq: Two times a day (BID) | ORAL | 2 refills | Status: DC

## 2018-07-10 MED ORDER — FUROSEMIDE 20 MG PO TABS: 20.0000 mg | ORAL_TABLET | Freq: Every day | ORAL | 2 refills | Status: DC

## 2018-07-10 MED ORDER — AMIODARONE HCL 200 MG PO TABS: 200.0000 mg | ORAL_TABLET | Freq: Every day | ORAL | 2 refills | Status: DC

## 2018-07-14 ENCOUNTER — Other Ambulatory Visit (HOSPITAL_COMMUNITY): Payer: Self-pay | Admitting: *Deleted

## 2018-07-14 DIAGNOSIS — I4819 Other persistent atrial fibrillation: Secondary | ICD-10-CM

## 2018-07-14 MED ORDER — FUROSEMIDE 20 MG PO TABS: 20.0000 mg | ORAL_TABLET | Freq: Every day | ORAL | 2 refills | Status: DC

## 2018-07-16 ENCOUNTER — Other Ambulatory Visit: Payer: Self-pay | Admitting: Cardiovascular Disease

## 2018-07-16 DIAGNOSIS — I5042 Chronic combined systolic (congestive) and diastolic (congestive) heart failure: Secondary | ICD-10-CM

## 2018-07-16 MED ORDER — SACUBITRIL-VALSARTAN 24-26 MG PO TABS: 1.0000 | ORAL_TABLET | Freq: Two times a day (BID) | ORAL | 2 refills | Status: DC

## 2018-07-16 MED FILL — ENTRESTO 24 MG-26 MG TABLET: 24-26 | 30 days supply | Qty: 60 | Fill #4

## 2018-07-16 NOTE — Telephone Encounter (Signed)
New Message           Patient is calling to get someone to call [facility] mail order [phone].[employer] states they didn't know the patient was taking Entresto, so in order for the patient to get Delene Loll they need to know that the patient is taking the medication.. Patient is going to run out she needs some sent to local pharmacy-[facility] until her mail order comes in.

## 2018-07-16 NOTE — Telephone Encounter (Signed)
Pt's medication was sent to pt's mail order pharmacy as requested and I also called her local pharmacy to request a refill so pt can get it at her local pharmacy until mail order arrives. I advised the pt that if she has any other problems, questions or concerns, to call the office back. Pt verbalized understanding.

## 2018-07-16 NOTE — Telephone Encounter (Signed)
This is a A-Fib clinic pt 

## 2018-07-17 ENCOUNTER — Telehealth (INDEPENDENT_AMBULATORY_CARE_PROVIDER_SITE_OTHER): Payer: Medicare Other | Admitting: Cardiovascular Disease

## 2018-07-17 ENCOUNTER — Encounter: Payer: Self-pay | Admitting: Cardiovascular Disease

## 2018-07-17 ENCOUNTER — Other Ambulatory Visit: Payer: Self-pay

## 2018-07-17 ENCOUNTER — Other Ambulatory Visit (HOSPITAL_COMMUNITY): Payer: Self-pay | Admitting: *Deleted

## 2018-07-17 VITALS — Ht 69.5 in

## 2018-07-17 DIAGNOSIS — I4819 Other persistent atrial fibrillation: Secondary | ICD-10-CM | POA: Diagnosis not present

## 2018-07-17 DIAGNOSIS — I5042 Chronic combined systolic (congestive) and diastolic (congestive) heart failure: Secondary | ICD-10-CM

## 2018-07-17 DIAGNOSIS — Z7189 Other specified counseling: Secondary | ICD-10-CM | POA: Diagnosis not present

## 2018-07-17 MED ORDER — AMIODARONE HCL 200 MG PO TABS: 200.0000 mg | ORAL_TABLET | Freq: Every day | ORAL | 2 refills | Status: DC

## 2018-07-17 MED ORDER — APIXABAN 5 MG PO TABS: 5.0000 mg | ORAL_TABLET | Freq: Two times a day (BID) | ORAL | 2 refills | Status: DC

## 2018-07-17 MED ORDER — FUROSEMIDE 20 MG PO TABS: 20.0000 mg | ORAL_TABLET | Freq: Every day | ORAL | 2 refills | Status: DC

## 2018-07-17 MED ORDER — METOPROLOL TARTRATE 100 MG PO TABS: 100.0000 mg | ORAL_TABLET | Freq: Two times a day (BID) | ORAL | 3 refills | Status: DC

## 2018-07-17 NOTE — Patient Instructions (Signed)
Medication Instructions:  Your physician recommends that you continue on your current medications as directed. Please refer to the Current Medication list given to you today.  If you need a refill on your cardiac medications before your next appointment, please call your pharmacy.    Lab work: None Ordered    Testing/Procedures: None Ordered    Follow-Up: Your physician recommends that you return for a follow-up appointment on Friday Sept. 11 at 10:00 am

## 2018-07-17 NOTE — Progress Notes (Signed)
Virtual Visit via Telephone Note   This visit type was conducted due to national recommendations for restrictions regarding the COVID-19 Pandemic (e.g. social distancing) in an effort to limit this patient's exposure and mitigate transmission in our community.  Due to her co-morbid illnesses, this patient is at least at moderate risk for complications without adequate follow up.  This format is felt to be most appropriate for this patient at this time.  The patient did not have access to video technology/had technical difficulties with video requiring transitioning to audio format only (telephone).  All issues noted in this document were discussed and addressed.  No physical exam could be performed with this format.  Please refer to the patient's chart for her  consent to telehealth for [facility].   Date:  07/17/2018   ID:  Joanna [name], DOB 12-17-1960, MRN [id number]  Patient Location: Home Provider Location: Home  PCP:  Ladell Pier, MD  Cardiologist:  Mertie Moores, MD  Electrophysiologist:  Curt Bears  Evaluation Performed:  Follow-Up Visit  Chief Complaint:  Follow up for nonischemic cardiomyopathy, hx of atrial fib, HTN  July 17, 2018    Joanna [name] is a 58 y.o. female with history of rapid atrial fibrillation and a nonischemic cardiomyopathy.  She was seen minutes on March 08, 2018 in the hospital.  She had rapid atrial fibrillation at that time.  She was also noted to have severely reduced left ventricular systolic function with an ejection fraction of 20 to 25%.  There was grade 2 diastolic dysfunction.  She had moderate to severe pulmonary artery hypertension with an estimated PA pressure of 60 mmHg.  She had a massively dilated left atrium.  She had severe mitral regurgitation.     Follow-up transesophageal echo performed on February 7 suggested that the mitral regurgitation was only moderate in severity.   The patient was successfully cardioverted following the  TEE.  The losartan was changed to [facility].  She was seen in the office by Robbie Lis, [place] on Feb. 25.    She was back in atrial fibrillation at that time.  The metoprolol was increased to 100 mg twice a day.  She was referred to the A. fib clinic.   Was started on amiodarone ( not a good candidate for other antiarrhythmics  Follow up visit in AF clinic - HR was well controlled.   Was scheduled for cardioverson but this has been postponed  Is doing well.   No dyspnea Has stopped smoking Has gained some weight  Is walking some ,  No DOE   The patient does not have symptoms concerning for COVID-19 infection (fever, chills, cough, or new shortness of breath).    Past Medical History:  Diagnosis Date  . Atrial fibrillation with RVR ([place])   . Chronic combined systolic (congestive) and diastolic (congestive) heart failure ([place])   . Dyspnea   . Goiter   . Hypertension   . NICM (nonischemic cardiomyopathy) ([place])    a. 08/2005 Echo: EF 30-35%, mod diff HK. Mild to Mod MR. Mildly dil LA; b. 08/2005 Cath: Nl Cors. Elevated CO w/o shunt; c. 09/2007 Echo: EF 45%. Mild to mod MR; c. 03/2015 Echo: EF 45%, global HK. Gr1 DD. Mild MR. Mildly dil LA.   Past Surgical History:  Procedure Laterality Date  . APPENDECTOMY    . BACK SURGERY    . CARDIOVERSION N/A 03/14/2018   Procedure: CARDIOVERSION;  Surgeon: Fay Records, MD;  Location: [place];  Service:  Cardiovascular;  Laterality: N/A;  . TEE WITHOUT CARDIOVERSION N/A 03/14/2018   Procedure: TRANSESOPHAGEAL ECHOCARDIOGRAM (TEE);  Surgeon: Pricilla Riffle, MD;  Location: [facility] ENDOSCOPY;  Service: Cardiovascular;  Laterality: N/A;     No outpatient medications have been marked as taking for the 07/17/18 encounter (Appointment) with Reeda Soohoo, Deloris Ping, MD.     Allergies:   Other   Social History   Tobacco Use  . Smoking status: Former Smoker    Packs/day: 2.00    Years: 30.00    Pack years: 60.00    Quit date: 02/08/2018    Years since quitting: 0.4   . Smokeless tobacco: Never Used  Substance Use Topics  . Alcohol use: No    Comment: quit drinking year ago  . Drug use: No     Family Hx: The patient's family history includes Cancer in her mother; Heart disease in her father.  ROS:   Please see the history of present illness.     All other systems reviewed and are negative.   Prior CV studies:   The following studies were reviewed today:    Labs/Other Tests and Data Reviewed:    EKG:  No ECG reviewed.  Recent Labs: 03/08/2018: B Natriuretic Peptide 270.4; TSH 1.988 03/09/2018: Magnesium 1.6 03/15/2018: Hemoglobin 14.3; Platelets 241 04/03/2018: ALT 16; BUN 23; Creatinine, Ser 1.07; Potassium 3.9; Sodium 143   Recent Lipid Panel Lab Results  Component Value Date/Time   CHOL 130 03/09/2018 04:07 AM   TRIG 63 03/09/2018 04:07 AM   HDL 36 (L) 03/09/2018 04:07 AM   CHOLHDL 3.6 03/09/2018 04:07 AM   LDLCALC 81 03/09/2018 04:07 AM    Wt Readings from Last 3 Encounters:  04/03/18 210 lb 6.4 oz (95.4 kg)  04/01/18 212 lb (96.2 kg)  03/17/18 210 lb 12.8 oz (95.6 kg)     Objective:    Vital Signs:  There were no vitals taken for this visit.   No exam availabl   ASSESSMENT & PLAN:    1. Chronic combined CHF:   Seems to be doing well.  She is feeling well   2.  Persistent atrial fib:   Is on amio.  HR is much better.  Cardioversion has been delayed for now.   Will see her again in 3 months.   Ive suggested that we try cardioversion again after amio loading - she will have a better chance of staying in NSR . Will discuss at office visit    3.    HTN:   BP has been good  4.  Weight gain:   Encouraged wt loss.   COVID-19 Education: The signs and symptoms of COVID-19 were discussed with the patient and how to seek care for testing (follow up with PCP or arrange E-visit).  The importance of social distancing was discussed today.  Time:   Today, I have spent  17  minutes with the patient with telehealth technology  discussing the above problems.     Medication Adjustments/Labs and Tests Ordered: Current medicines are reviewed at length with the patient today.  Concerns regarding medicines are outlined above.   Tests Ordered: No orders of the defined types were placed in this encounter.   Medication Changes: No orders of the defined types were placed in this encounter.   Disposition:  Follow up in 3 month(s)  Signed, Kristeen Miss, MD  07/17/2018 8:05 AM    [facility]

## 2018-10-02 ENCOUNTER — Other Ambulatory Visit: Payer: Medicare Other | Admitting: Internal Medicine

## 2018-10-08 ENCOUNTER — Ambulatory Visit (HOSPITAL_COMMUNITY): Payer: Medicare Other | Admitting: Physician Assistant

## 2018-10-09 DIAGNOSIS — I10 Essential (primary) hypertension: Secondary | ICD-10-CM | POA: Diagnosis not present

## 2018-10-16 NOTE — Progress Notes (Signed)
Cardiology Office Note:    Date:  10/17/2018   ID:  Joanna [name], DOB December 21, 1960, MRN [id number]  PCP:  Marcine Matar, MD  Cardiologist:  Kristeen Miss, MD  Electrophysiologist:  None   Referring MD: Marcine Matar, MD   Chief Complaint  Patient presents with  . Congestive Heart Failure    History of Present Illness:    Joanna [name] is a 58 y.o. female with a hx of  Chronic combined CHF .    Seen with her sister.   EF is 25%.   Grade 2 diastolic dysfunction  She was hospitalized in Feb. 2020 with respiratory failure and AFib   TEE showed mod - severe MR .   Was in atrial fib at the time - was successfully cardioverted at the time   Was seen by Chelsea Aus, PA in Feb.  Was back in AFib at the time.  Started on amiodarone.  I had a telemedicine visit with her in June, 2020 Was feeling better on amiodarone  Feels fine.    She does not want to have another cardioversion but is willing to consider it.   Has an appt with EP clinic on Monday  Breathing is good.  Still eating lots of salty foods.   HR is very regular today .      Past Medical History:  Diagnosis Date  . Atrial fibrillation with RVR ([facility])   . Chronic combined systolic (congestive) and diastolic (congestive) heart failure ([facility])   . Dyspnea   . Goiter   . Hypertension   . NICM (nonischemic cardiomyopathy) ([facility])    a. 08/2005 Echo: EF 30-35%, mod diff HK. Mild to Mod MR. Mildly dil LA; b. 08/2005 Cath: Nl Cors. Elevated CO w/o shunt; c. 09/2007 Echo: EF 45%. Mild to mod MR; c. 03/2015 Echo: EF 45%, global HK. Gr1 DD. Mild MR. Mildly dil LA.    Past Surgical History:  Procedure Laterality Date  . APPENDECTOMY    . BACK SURGERY    . CARDIOVERSION N/A 03/14/2018   Procedure: CARDIOVERSION;  Surgeon: Pricilla Riffle, MD;  Location: [facility] ENDOSCOPY;  Service: Cardiovascular;  Laterality: N/A;  . TEE WITHOUT CARDIOVERSION N/A 03/14/2018   Procedure: TRANSESOPHAGEAL ECHOCARDIOGRAM (TEE);  Surgeon: Pricilla Riffle,  MD;  Location: [facility] ENDOSCOPY;  Service: Cardiovascular;  Laterality: N/A;    Current Medications: Current Meds  Medication Sig  . amiodarone (PACERONE) 200 MG tablet Take 1 tablet (200 mg total) by mouth daily.  Marland Kitchen apixaban (ELIQUIS) 5 MG TABS tablet Take 1 tablet (5 mg total) by mouth 2 (two) times daily.  . furosemide (LASIX) 20 MG tablet Take 1 tablet (20 mg total) by mouth daily.  . metoprolol tartrate (LOPRESSOR) 100 MG tablet Take 1 tablet (100 mg total) by mouth 2 (two) times daily.  . sacubitril-valsartan (ENTRESTO) 24-26 MG Take 1 tablet by mouth 2 (two) times daily.     Allergies:   Other   Social History   Socioeconomic History  . Marital status: Single    Spouse name: Not on file  . Number of children: Not on file  . Years of education: Not on file  . Highest education level: Not on file  Occupational History  . Occupation: unemployed  Social Needs  . Financial resource strain: Not on file  . Food insecurity    Worry: Not on file    Inability: Not on file  . Transportation needs    Medical: Not on file  Non-medical: Not on file  Tobacco Use  . Smoking status: Former Smoker    Packs/day: 2.00    Years: 30.00    Pack years: 60.00    Quit date: 02/08/2018    Years since quitting: 0.6  . Smokeless tobacco: Never Used  Substance and Sexual Activity  . Alcohol use: No    Comment: quit drinking year ago  . Drug use: No  . Sexual activity: Not on file  Lifestyle  . Physical activity    Days per week: Not on file    Minutes per session: Not on file  . Stress: Not on file  Relationships  . Social Musicianconnections    Talks on phone: Not on file    Gets together: Not on file    Attends religious service: Not on file    Active member of club or organization: Not on file    Attends meetings of clubs or organizations: Not on file    Relationship status: Not on file  Other Topics Concern  . Not on file  Social History Narrative  . Not on file     Family History:  The patient's family history includes Cancer in her mother; Heart disease in her father.  ROS:   Please see the history of present illness.     All other systems reviewed and are negative.  EKGs/Labs/Other Studies Reviewed:    The following studies were reviewed today:   EKG:  Sept. 11, 2020.   Sinus bradycardia 57 beats a minute.  Sinus arrhythmia.  Nonspecific ST and T wave changes.  Recent Labs: 03/08/2018: B Natriuretic Peptide 270.4; TSH 1.988 03/09/2018: Magnesium 1.6 03/15/2018: Hemoglobin 14.3; Platelets 241 04/03/2018: ALT 16; BUN 23; Creatinine, Ser 1.07; Potassium 3.9; Sodium 143  Recent Lipid Panel    Component Value Date/Time   CHOL 130 03/09/2018 0407   TRIG 63 03/09/2018 0407   HDL 36 (L) 03/09/2018 0407   CHOLHDL 3.6 03/09/2018 0407   VLDL 13 03/09/2018 0407   LDLCALC 81 03/09/2018 0407    Physical Exam:    VS:  BP (!) 160/120   Pulse 67   Ht 5' 9.5" (1.765 m)   Wt 237 lb 12.8 oz (107.9 kg)   SpO2 99%   BMI 34.61 kg/m     Wt Readings from Last 3 Encounters:  10/17/18 237 lb 12.8 oz (107.9 kg)  04/03/18 210 lb 6.4 oz (95.4 kg)  04/01/18 212 lb (96.2 kg)     GEN:  Middle age female,  Tall,   HEENT: Normal NECK: No JVD; No carotid bruits LYMPHATICS: No lymphadenopathy CARDIAC: rr, soft systolic murmur  RESPIRATORY:  Clear to auscultation without rales, wheezing or rhonchi  ABDOMEN: Soft, non-tender, non-distended MUSCULOSKELETAL:  No edema; No deformity  SKIN: Warm and dry NEUROLOGIC:  Alert and oriented x 3 PSYCHIATRIC:    ASSESSMENT:    No diagnosis found. PLAN:    In order of problems listed above:  1. Atrial fibrillation: She is been on amiodarone.  EKG today demonstrates sinus bradycardia at 57 beats a minute.  I will cancel her follow-up A. fib appointment for this coming Monday.  We will continue amiodarone.  We will refer her back to A. fib clinic if needed.  Her heart rate is low.  We will reduce the metoprolol to 50 mg twice a day.   2.  Chronic systolic congestive heart failure: She still eats quite a bit of salt.  Her blood pressure remains elevated.  We will  increase her Entresto to 49-51 mg twice a day.  I have advised her to greatly decrease her salt intake.     Medication Adjustments/Labs and Tests Ordered: Current medicines are reviewed at length with the patient today.  Concerns regarding medicines are outlined above.  No orders of the defined types were placed in this encounter.  No orders of the defined types were placed in this encounter.   There are no Patient Instructions on file for this visit.   Signed, Mertie Moores, MD  10/17/2018 10:23 AM    [facility]

## 2018-10-17 ENCOUNTER — Other Ambulatory Visit: Payer: Self-pay

## 2018-10-17 ENCOUNTER — Encounter: Payer: Self-pay | Admitting: Cardiovascular Disease

## 2018-10-17 ENCOUNTER — Ambulatory Visit (INDEPENDENT_AMBULATORY_CARE_PROVIDER_SITE_OTHER): Payer: Medicare Other | Admitting: Cardiovascular Disease

## 2018-10-17 VITALS — BP 160/120 | HR 67 | Ht 69.5 in | Wt 237.8 lb

## 2018-10-17 DIAGNOSIS — I4891 Unspecified atrial fibrillation: Secondary | ICD-10-CM | POA: Diagnosis not present

## 2018-10-17 DIAGNOSIS — I5042 Chronic combined systolic (congestive) and diastolic (congestive) heart failure: Secondary | ICD-10-CM

## 2018-10-17 DIAGNOSIS — I10 Essential (primary) hypertension: Secondary | ICD-10-CM | POA: Diagnosis not present

## 2018-10-17 DIAGNOSIS — I4819 Other persistent atrial fibrillation: Secondary | ICD-10-CM | POA: Diagnosis not present

## 2018-10-17 MED ORDER — METOPROLOL TARTRATE 50 MG PO TABS: 50.0000 mg | ORAL_TABLET | Freq: Two times a day (BID) | ORAL | 3 refills | Status: DC

## 2018-10-17 MED ORDER — ENTRESTO 49-51 MG PO TABS: 1.0000 | ORAL_TABLET | Freq: Two times a day (BID) | ORAL | 3 refills | Status: DC

## 2018-10-17 NOTE — Patient Instructions (Signed)
Medication Instructions:  Your physician has recommended you make the following change in your medication:  DECREASE Metoprolol to 50 mg twice daily INCREASE Entresto to 49-51 mg (you will take 2 pills twice daily until your next prescription arrives)  If you need a refill on your cardiac medications before your next appointment, please call your pharmacy.    Lab work: Your physician recommends that you return for lab work on Friday Oct. 2 at 9:00 am for basic metabolic profile You do not have to fast for this appointment    Testing/Procedures: None Ordered    Follow-Up: Your physician recommends that you return for a follow-up appointment on December 16 at 9:00 am with Dr. Acie Fredrickson

## 2018-10-20 ENCOUNTER — Ambulatory Visit (HOSPITAL_COMMUNITY): Payer: Medicare Other | Admitting: Physician Assistant

## 2018-11-07 ENCOUNTER — Other Ambulatory Visit: Payer: Medicare Other

## 2018-11-14 ENCOUNTER — Other Ambulatory Visit: Payer: Medicare Other | Admitting: Internal Medicine

## 2018-11-19 ENCOUNTER — Other Ambulatory Visit: Payer: Self-pay

## 2018-11-19 ENCOUNTER — Other Ambulatory Visit: Payer: Medicare Other | Admitting: *Deleted

## 2018-11-19 ENCOUNTER — Encounter (INDEPENDENT_AMBULATORY_CARE_PROVIDER_SITE_OTHER): Payer: Self-pay

## 2018-11-19 DIAGNOSIS — I5042 Chronic combined systolic (congestive) and diastolic (congestive) heart failure: Secondary | ICD-10-CM | POA: Diagnosis not present

## 2018-11-19 DIAGNOSIS — I4819 Other persistent atrial fibrillation: Secondary | ICD-10-CM | POA: Diagnosis not present

## 2018-11-20 LAB — BASIC METABOLIC PANEL
BUN/Creatinine Ratio: 19 (ref 9–23)
BUN: 18 mg/dL (ref 6–24)
CO2: 22 mmol/L (ref 20–29)
Calcium: 9.8 mg/dL (ref 8.7–10.2)
Chloride: 105 mmol/L (ref 96–106)
Creatinine, Ser: 0.97 mg/dL (ref 0.57–1.00)
GFR calc Af Amer: 74 mL/min/{1.73_m2} (ref >59)
GFR calc non Af Amer: 64 mL/min/{1.73_m2} (ref >59)
Glucose: 81 mg/dL (ref 65–99)
Potassium: 4.7 mmol/L (ref 3.5–5.2)
Sodium: 144 mmol/L (ref 134–144)

## 2019-01-21 ENCOUNTER — Ambulatory Visit: Payer: Medicare Other | Admitting: Cardiovascular Disease

## 2019-02-02 IMAGING — DX DG CHEST 1V PORT
1 series · 1 of 1 positions shown · non-contrast
Comparison: Single-view of the chest 02/09/2018, 02/08/2018 and
08/29/2005. CT chest 08/29/2005.

CLINICAL DATA: Chest pain and shortness of breath today.

EXAM:
PORTABLE CHEST 1 VIEW

[chest ap]
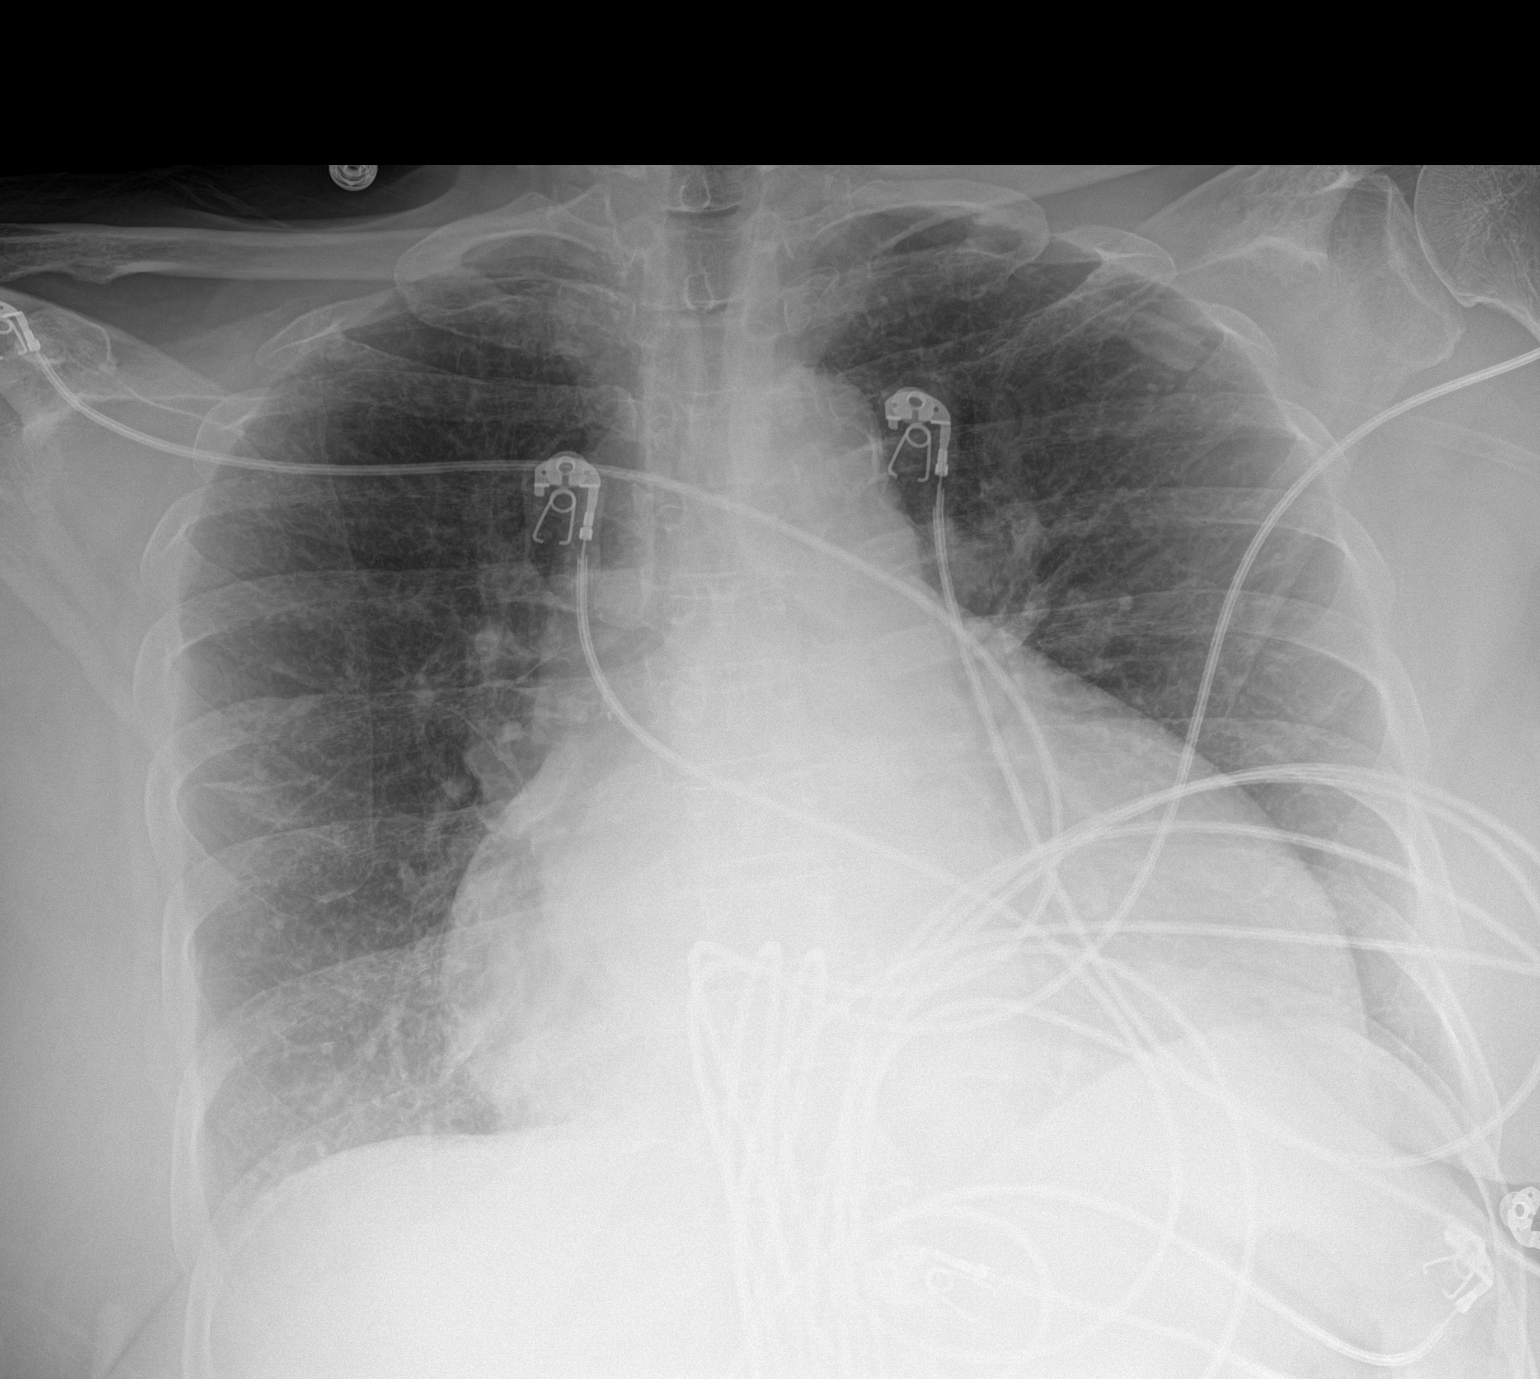

[1 of 1 positions shown; findings below may reference images not displayed]

FINDINGS: There is marked enlargement of the cardiopericardial silhouette. No
pulmonary edema. Lungs are clear. No pneumothorax or pleural
effusion.
IMPRESSION: Markedly enlarged cardiopericardial silhouette consistent with
cardiomegaly and/or pericardial effusion.

Lungs are clear.

## 2019-02-16 ENCOUNTER — Telehealth (INDEPENDENT_AMBULATORY_CARE_PROVIDER_SITE_OTHER): Payer: Medicare Other | Admitting: Cardiovascular Disease

## 2019-02-16 ENCOUNTER — Other Ambulatory Visit: Payer: Self-pay

## 2019-02-16 VITALS — BP 130/80 | HR 66 | Ht 69.0 in | Wt 235.0 lb

## 2019-02-16 DIAGNOSIS — I10 Essential (primary) hypertension: Secondary | ICD-10-CM

## 2019-02-16 DIAGNOSIS — Z7189 Other specified counseling: Secondary | ICD-10-CM | POA: Diagnosis not present

## 2019-02-16 DIAGNOSIS — I5021 Acute systolic (congestive) heart failure: Secondary | ICD-10-CM | POA: Diagnosis not present

## 2019-02-16 DIAGNOSIS — I4891 Unspecified atrial fibrillation: Secondary | ICD-10-CM

## 2019-02-16 NOTE — Progress Notes (Signed)
Virtual Visit via Telephone Note   This visit type was conducted due to national recommendations for restrictions regarding the COVID-19 Pandemic (e.g. social distancing) in an effort to limit this patient's exposure and mitigate transmission in our community.  Due to her co-morbid illnesses, this patient is at least at moderate risk for complications without adequate follow up.  This format is felt to be most appropriate for this patient at this time.  The patient did not have access to video technology/had technical difficulties with video requiring transitioning to audio format only (telephone).  All issues noted in this document were discussed and addressed.  No physical exam could be performed with this format.  Please refer to the patient's chart for her  consent to telehealth for [name].   Date:  02/16/2019   ID:  Joanna [name], DOB Jan 21, 1961, MRN [id number]  Patient Location: Home Provider Location: Home  PCP:  Joanna Matar, MD  Cardiologist:  Joanna Miss, MD  Electrophysiologist:  None    Previous Notes  Joanna [name] is a 59 y.o. female with a hx of  Chronic combined CHF .    Seen with her sister.   EF is 25%.   Grade 2 diastolic dysfunction  She was hospitalized in Feb. 2020 with respiratory failure and AFib   TEE showed mod - severe MR .   Was in atrial fib at the time - was successfully cardioverted at the time   Was seen by Joanna Aus, PA in Feb.  Was back in AFib at the time.  Started on amiodarone.  I had a telemedicine visit with her in June, 2020 Was feeling better on amiodarone  Feels fine.    She does not want to have another cardioversion but is willing to consider it.   Has an appt with EP clinic on Monday  Breathing is good.  Still eating lots of salty foods.   HR is very regular today     Evaluation Performed:  Follow-Up Visit  Chief Complaint:   CHF, Atrial fib   Jan. 11, 2021   Joanna [name] is a 59 y.o. female  with  CHF and PAF. I saw her in Sept and we increased her Entresto and reduced the metoprolol. Doing great.  Feeling much better on the recent increase in [place]. Avoiding salt for the most part VS look No leg swelling, no covid symptoms Wears a mask, social distances Is active  Wt today is 235 lbs HR seems to be regular.     The patient does not have symptoms concerning for COVID-19 infection (fever, chills, cough, or new shortness of breath).    Past Medical History:  Diagnosis Date  . Atrial fibrillation with RVR ([facility])   . Chronic combined systolic (congestive) and diastolic (congestive) heart failure ([facility])   . Dyspnea   . Goiter   . Hypertension   . NICM (nonischemic cardiomyopathy) ([facility])    a. 08/2005 Echo: EF 30-35%, mod diff HK. Mild to Mod MR. Mildly dil LA; b. 08/2005 Cath: Nl Cors. Elevated CO w/o shunt; c. 09/2007 Echo: EF 45%. Mild to mod MR; c. 03/2015 Echo: EF 45%, global HK. Gr1 DD. Mild MR. Mildly dil LA.   Past Surgical History:  Procedure Laterality Date  . APPENDECTOMY    . BACK SURGERY    . CARDIOVERSION N/A 03/14/2018   Procedure: CARDIOVERSION;  Surgeon: Joanna Riffle, MD;  Location: [facility] ENDOSCOPY;  Service: Cardiovascular;  Laterality: N/A;  . TEE WITHOUT CARDIOVERSION  N/A 03/14/2018   Procedure: TRANSESOPHAGEAL ECHOCARDIOGRAM (TEE);  Surgeon: Joanna Riffle, MD;  Location: [facility] ENDOSCOPY;  Service: Cardiovascular;  Laterality: N/A;     Current Meds  Medication Sig  . amiodarone (PACERONE) 200 MG tablet Take 1 tablet (200 mg total) by mouth daily.  Marland Kitchen apixaban (ELIQUIS) 5 MG TABS tablet Take 1 tablet (5 mg total) by mouth 2 (two) times daily.  . furosemide (LASIX) 20 MG tablet Take 1 tablet (20 mg total) by mouth daily.  . metoprolol tartrate (LOPRESSOR) 50 MG tablet Take 1 tablet (50 mg total) by mouth 2 (two) times daily.  . sacubitril-valsartan (ENTRESTO) 49-51 MG Take 1 tablet by mouth 2 (two) times daily.     Allergies:   Other   Social History    Tobacco Use  . Smoking status: Former Smoker    Packs/day: 2.00    Years: 30.00    Pack years: 60.00    Quit date: 02/08/2018    Years since quitting: 1.0  . Smokeless tobacco: Never Used  Substance Use Topics  . Alcohol use: No    Comment: quit drinking year ago  . Drug use: No     Family Hx: The patient's family history includes Cancer in her mother; Heart disease in her father.  ROS:   Please see the history of present illness.     All other systems reviewed and are negative.   Prior CV studies:   The following studies were reviewed today:    Labs/Other Tests and Data Reviewed:    EKG:  No ECG reviewed.  Recent Labs: 03/08/2018: B Natriuretic Peptide 270.4; TSH 1.988 03/09/2018: Magnesium 1.6 03/15/2018: Hemoglobin 14.3; Platelets 241 04/03/2018: ALT 16 11/19/2018: BUN 18; Creatinine, Ser 0.97; Potassium 4.7; Sodium 144   Recent Lipid Panel Lab Results  Component Value Date/Time   CHOL 130 03/09/2018 04:07 AM   TRIG 63 03/09/2018 04:07 AM   HDL 36 (L) 03/09/2018 04:07 AM   CHOLHDL 3.6 03/09/2018 04:07 AM   LDLCALC 81 03/09/2018 04:07 AM    Wt Readings from Last 3 Encounters:  02/16/19 235 lb (106.6 kg)  10/17/18 237 lb 12.8 oz (107.9 kg)  04/03/18 210 lb 6.4 oz (95.4 kg)     Objective:    Vital Signs:  BP 130/80   Pulse 66   Ht 5\' 9"  (1.753 m)   Wt 235 lb (106.6 kg)   BMI 34.70 kg/m      ASSESSMENT & PLAN:    1. Chronic systolic congestive heart failure: Joanna [name] seems to be doing very well.  We increased her Entresto at her last office visit in September.  She says that she feels great.  She is not having any dizziness.  She denies any cough.  She remains very active.  We will continue with her same medications.  We  will see her back in the office in 6 months for an office visit and basic metabolic profile with an APP   2.  Paroxysmal atrial fibrillation.  She was in normal sinus rhythm when I last saw her in September..  She feels great and  thinks that her heart is very regular.  We will consider decreasing her amiodarone to 200 mg3- 4 days a week at her next office visit.October  COVID-19 Education: The signs and symptoms of COVID-19 were discussed with the patient and how to seek care for testing (follow up with PCP or arrange E-visit).  The importance of social distancing was discussed today.  Time:  Today, I have spent  18 minutes with the patient with telehealth technology discussing the above problems.     Medication Adjustments/Labs and Tests Ordered: Current medicines are reviewed at length with the patient today.  Concerns regarding medicines are outlined above.   Tests Ordered: No orders of the defined types were placed in this encounter.   Medication Changes: No orders of the defined types were placed in this encounter.   Follow Up:  In Person in 6 month(s) with an APP   Signed, Mertie Moores, MD  02/16/2019 8:05 AM    [place]

## 2019-02-16 NOTE — Patient Instructions (Signed)
Medication Instructions:  Your physician recommends that you continue on your current medications as directed. Please refer to the Current Medication list given to you today.  *If you need a refill on your cardiac medications before your next appointment, please call your pharmacy*  Lab Work: Lab work will be checked at your next office visit in 6 months.  If you have labs (blood work) drawn today and your tests are completely normal, you will receive your results only by: Marland Kitchen MyChart Message (if you have MyChart) OR . A paper copy in the mail If you have any lab test that is abnormal or we need to change your treatment, we will call you to review the results.  Testing/Procedures: none  Follow-Up: At [facility], you and your health needs are our priority.  As part of our continuing mission to provide you with exceptional heart care, we have created designated Provider Care Teams.  These Care Teams include your primary Cardiologist (physician) and Advanced Practice Providers (APPs -  Physician Assistants and Nurse Practitioners) who all work together to provide you with the care you need, when you need it.  Your next appointment:   6 month(s)  The format for your next appointment:   In Person  Provider:   You will see one of the following Advanced Practice Providers on your designated Care Team:    Tereso Newcomer, PA-C  [place]in [place], [place]  Berton Bon, NP   Other Instructions

## 2019-03-11 ENCOUNTER — Other Ambulatory Visit: Payer: Self-pay | Admitting: Cardiovascular Disease

## 2019-03-11 DIAGNOSIS — I4819 Other persistent atrial fibrillation: Secondary | ICD-10-CM

## 2019-03-11 MED ORDER — FUROSEMIDE 20 MG PO TABS: 20.0000 mg | ORAL_TABLET | Freq: Every day | ORAL | 2 refills | Status: DC

## 2019-03-11 MED ORDER — FUROSEMIDE 20 MG PO TABS: 20.0000 mg | ORAL_TABLET | Freq: Every day | ORAL | 0 refills | Status: DC

## 2019-03-11 NOTE — Telephone Encounter (Signed)
New Message      *STAT* If patient is at the pharmacy, call can be transferred to refill team.   1. Which medications need to be refilled? (please list name of each medication and dose if known) furosemide (LASIX) 20 MG tablet  2. Which pharmacy/location (including street and city if local pharmacy) is medication to be sent to? [id number] - [place], [place] - [address] AT [facility] OF [address]  3. Do they need a 30 day or 90 day supply?  90

## 2019-03-11 NOTE — Addendum Note (Signed)
Addended by: Graylin Shiver on: 03/11/2019 04:21 PM   Modules accepted: Orders

## 2019-06-29 DIAGNOSIS — Z23 Encounter for immunization: Secondary | ICD-10-CM | POA: Diagnosis not present

## 2019-07-08 ENCOUNTER — Other Ambulatory Visit (HOSPITAL_COMMUNITY): Payer: Self-pay | Admitting: Physician Assistant

## 2019-07-08 DIAGNOSIS — I4819 Other persistent atrial fibrillation: Secondary | ICD-10-CM

## 2019-07-08 NOTE — Telephone Encounter (Signed)
Pt last saw Dr Elease Hashimoto 02/16/19 telemedicine Covid-19, last labs 11/19/18 Creat 0.97, age 59, weight 106.6kg, based on specified criteria pt is on appropriate dosage of Eliquis 5mg  BID.  Will refill rx.

## 2019-07-13 ENCOUNTER — Telehealth: Payer: Self-pay | Admitting: Cardiovascular Disease

## 2019-07-13 ENCOUNTER — Other Ambulatory Visit: Payer: Medicare Other

## 2019-07-13 NOTE — Telephone Encounter (Signed)
Patient's Eliquis has been refilled by [facility] clinic.

## 2019-07-13 NOTE — Telephone Encounter (Signed)
New message   Pt c/o medication issue:  1. Name of Medication: ELIQUIS 5 MG TABS tablet  2. How are you currently taking this medication (dosage and times per day)? As written   3. Are you having a reaction (difficulty breathing--STAT)? n/a  4. What is your medication issue? Patient needs a new prescription for this medication sent to [facility] [place], [place] - [address]

## 2019-07-20 ENCOUNTER — Other Ambulatory Visit: Payer: Self-pay

## 2019-07-20 ENCOUNTER — Other Ambulatory Visit: Payer: Medicare Other | Admitting: *Deleted

## 2019-07-20 DIAGNOSIS — I5021 Acute systolic (congestive) heart failure: Secondary | ICD-10-CM | POA: Diagnosis not present

## 2019-07-20 DIAGNOSIS — I4891 Unspecified atrial fibrillation: Secondary | ICD-10-CM | POA: Diagnosis not present

## 2019-07-20 LAB — BASIC METABOLIC PANEL
BUN/Creatinine Ratio: 16 (ref 9–23)
BUN: 13 mg/dL (ref 6–24)
CO2: 22 mmol/L (ref 20–29)
Calcium: 9.5 mg/dL (ref 8.7–10.2)
Chloride: 105 mmol/L (ref 96–106)
Creatinine, Ser: 0.83 mg/dL (ref 0.57–1.00)
GFR calc Af Amer: 89 mL/min/{1.73_m2} (ref >59)
GFR calc non Af Amer: 77 mL/min/{1.73_m2} (ref >59)
Glucose: 104 mg/dL — ABNORMAL HIGH (ref 65–99)
Potassium: 3.7 mmol/L (ref 3.5–5.2)
Sodium: 144 mmol/L (ref 134–144)

## 2019-07-22 ENCOUNTER — Other Ambulatory Visit: Payer: Medicare Other

## 2019-07-30 ENCOUNTER — Other Ambulatory Visit: Payer: Self-pay | Admitting: Cardiovascular Disease

## 2019-07-30 DIAGNOSIS — I4819 Other persistent atrial fibrillation: Secondary | ICD-10-CM

## 2019-07-30 NOTE — Telephone Encounter (Signed)
*  STAT* If patient is at the pharmacy, call can be transferred to refill team.   1. Which medications need to be refilled? (please list name of each medication and dose if known) furosemide (LASIX) 20 MG tablet  2. Which pharmacy/location (including street and city if local pharmacy) is medication to be sent to? [facility] - [place], [place] - [address]  3. Do they need a 30 day or 90 day supply? 90   Patient is scheduled for 08/19/19 with Tereso Newcomer.

## 2019-07-30 NOTE — Telephone Encounter (Signed)
This is a A-Fib clinic pt 

## 2019-07-31 MED ORDER — FUROSEMIDE 20 MG PO TABS: 20.0000 mg | ORAL_TABLET | Freq: Every day | ORAL | 0 refills | Status: DC

## 2019-08-18 NOTE — Progress Notes (Deleted)
Cardiology Office Note:    Date:  08/18/2019   ID:  Joanna [name], DOB [birth year], MRN [id number]  PCP:  Marcine Matar, MD  Cardiologist:  Kristeen Miss, MD *** Electrophysiologist:  None   Referring MD: Marcine Matar, MD   Chief Complaint:  No chief complaint on file.    Patient Profile:    Joanna [name] is a 59 y.o. female with:   Combined systolic and diastolic CHF  Non-ischemic cardiomyopathy   Cath in 2007: normal coronary arteries   Echocardiogram 2/17: EF 45  Echocardiogram 03/2018: EF 20-25 (during admx w AF w RVR)  Persistent atrial fibrillation  S/p DCCV 03/2018 >> ERAF  Amiodarone Rx   Mitral regurgitation  Mod MR by TEE 03/2018   Hypertension   Prior CV studies: Transesophageal echocardiogram 03/14/2018 EF 25-30, small pericardial effusion, moderate MR  Echocardiogram 03/08/2018 EF 20-25, GR 1 DD, normal RV SF, RVSP 59.9, massive LAE, trivial pericardial effusion, severe MR, moderate TR  Echocardiogram 04/02/2015 EF 45, GR 1 DD, mild MR  Cardiac catheterization 08/31/2005 Normal coronary arteries   History of Present Illness:    Joanna [name] was last seen by Dr. Elease Hashimoto in 02/2019.  She returns for follow up.  The DICTATELATER SmartLink is not supported in this context. ***   Past Medical History:  Diagnosis Date   Atrial fibrillation with RVR (HCC)    Chronic combined systolic (congestive) and diastolic (congestive) heart failure (HCC)    Dyspnea    Goiter    Hypertension    NICM (nonischemic cardiomyopathy) (HCC)    a. 08/2005 Echo: EF 30-35%, mod diff HK. Mild to Mod MR. Mildly dil LA; b. 08/2005 Cath: Nl Cors. Elevated CO w/o shunt; c. 09/2007 Echo: EF 45%. Mild to mod MR; c. 03/2015 Echo: EF 45%, global HK. Gr1 DD. Mild MR. Mildly dil LA.    Current Medications: No outpatient medications have been marked as taking for the 08/19/19 encounter (Appointment) with Tereso Newcomer T, PA-C.     Allergies:   Other   Social History    Tobacco Use   Smoking status: Former Smoker    Packs/day: 2.00    Years: 30.00    Pack years: 60.00    Quit date: 02/08/2018    Years since quitting: 1.5   Smokeless tobacco: Never Used  Vaping Use   Vaping Use: Never used  Substance Use Topics   Alcohol use: No    Comment: quit drinking year ago   Drug use: No     Family Hx: The patient's family history includes Cancer in her mother; Heart disease in her father.  ROS   EKGs/Labs/Other Test Reviewed:    EKG:  EKG is *** ordered today.  The ekg ordered today demonstrates ***  Recent Labs: 07/20/2019: BUN 13; Creatinine, Ser 0.83; Potassium 3.7; Sodium 144   Recent Lipid Panel Lab Results  Component Value Date/Time   CHOL 130 03/09/2018 04:07 AM   TRIG 63 03/09/2018 04:07 AM   HDL 36 (L) 03/09/2018 04:07 AM   CHOLHDL 3.6 03/09/2018 04:07 AM   LDLCALC 81 03/09/2018 04:07 AM    Physical Exam:    VS:  There were no vitals taken for this visit.    Wt Readings from Last 3 Encounters:  02/16/19 235 lb (106.6 kg)  10/17/18 237 lb 12.8 oz (107.9 kg)  04/03/18 210 lb 6.4 oz (95.4 kg)     Physical Exam ***  ASSESSMENT & PLAN:    ***  Dispo:  No follow-ups on file.   Medication Adjustments/Labs and Tests Ordered: Current medicines are reviewed at length with the patient today.  Concerns regarding medicines are outlined above.  Tests Ordered: No orders of the defined types were placed in this encounter.  Medication Changes: No orders of the defined types were placed in this encounter.   Signed, Tereso Newcomer, PA-C  08/18/2019 2:48 PM    [facility] [address] [place], [place], [place]  [address] Phone: [phone]; Fax: [phone]

## 2019-08-19 ENCOUNTER — Ambulatory Visit: Payer: Medicare Other | Admitting: Physician Assistant

## 2019-09-07 ENCOUNTER — Ambulatory Visit (INDEPENDENT_AMBULATORY_CARE_PROVIDER_SITE_OTHER): Payer: Medicare Other | Admitting: Cardiovascular Disease

## 2019-09-07 ENCOUNTER — Other Ambulatory Visit: Payer: Self-pay

## 2019-09-07 ENCOUNTER — Encounter: Payer: Self-pay | Admitting: Cardiovascular Disease

## 2019-09-07 VITALS — BP 162/104 | HR 57 | Ht 69.5 in | Wt 293.4 lb

## 2019-09-07 DIAGNOSIS — I5042 Chronic combined systolic (congestive) and diastolic (congestive) heart failure: Secondary | ICD-10-CM

## 2019-09-07 DIAGNOSIS — I4819 Other persistent atrial fibrillation: Secondary | ICD-10-CM | POA: Diagnosis not present

## 2019-09-07 DIAGNOSIS — I4891 Unspecified atrial fibrillation: Secondary | ICD-10-CM | POA: Diagnosis not present

## 2019-09-07 DIAGNOSIS — I5043 Acute on chronic combined systolic (congestive) and diastolic (congestive) heart failure: Secondary | ICD-10-CM | POA: Insufficient documentation

## 2019-09-07 MED ORDER — SACUBITRIL-VALSARTAN 97-103 MG PO TABS: 1.0000 | ORAL_TABLET | Freq: Two times a day (BID) | ORAL | 3 refills | Status: DC

## 2019-09-07 MED ORDER — FUROSEMIDE 20 MG PO TABS: 20.0000 mg | ORAL_TABLET | Freq: Every day | ORAL | 3 refills | Status: DC

## 2019-09-07 NOTE — Progress Notes (Signed)
Cardiology Office Note:    Date:  09/07/2019   ID:  Joanna [name], DOB 07-14-1960, MRN [id number]  PCP:  Marcine Matar, MD  Cardiologist:  Kristeen Miss, MD  Electrophysiologist:  None   Referring MD: Marcine Matar, MD   Chief Complaint  Patient presents with  . Congestive Heart Failure  . Atrial Fibrillation    History of Present Illness:    Joanna [name] is a 59 y.o. female with a hx of  Chronic combined CHF .    Seen with her sister.   EF is 25%.   Grade 2 diastolic dysfunction  She was hospitalized in Feb. 2020 with respiratory failure and AFib   TEE showed mod - severe MR .   Was in atrial fib at the time - was successfully cardioverted at the time   Was seen by Chelsea Aus, PA in Feb.  Was back in AFib at the time.  Started on amiodarone.  I had a telemedicine visit with her in June, 2020 Was feeling better on amiodarone  Feels fine.    She does not want to have another cardioversion but is willing to consider it.   Has an appt with EP clinic on Monday  Breathing is good.  Still eating lots of salty foods.   HR is very regular today .     September 07, 2019:  Joanna [name] is a 59 year old female with a history of chronic combined congestive heart failure, her ejection fractions around 25%.  She has grade 2 diastolic dysfunction.  She has persistent hypertension.  She eats a very high salt diet. Eats meats and potatoes,  Does not eat vegetables for the most part. Will eat green beans on occasion .   Has started back smoking .     Past Medical History:  Diagnosis Date  . Atrial fibrillation with RVR (HCC)   . Chronic combined systolic (congestive) and diastolic (congestive) heart failure (HCC)   . Dyspnea   . Goiter   . Hypertension   . NICM (nonischemic cardiomyopathy) (HCC)    a. 08/2005 Echo: EF 30-35%, mod diff HK. Mild to Mod MR. Mildly dil LA; b. 08/2005 Cath: Nl Cors. Elevated CO w/o shunt; c. 09/2007 Echo: EF 45%. Mild to mod MR; c. 03/2015  Echo: EF 45%, global HK. Gr1 DD. Mild MR. Mildly dil LA.    Past Surgical History:  Procedure Laterality Date  . APPENDECTOMY    . BACK SURGERY    . CARDIOVERSION N/A 03/14/2018   Procedure: CARDIOVERSION;  Surgeon: Pricilla Riffle, MD;  Location: [facility] ENDOSCOPY;  Service: Cardiovascular;  Laterality: N/A;  . TEE WITHOUT CARDIOVERSION N/A 03/14/2018   Procedure: TRANSESOPHAGEAL ECHOCARDIOGRAM (TEE);  Surgeon: Pricilla Riffle, MD;  Location: [facility] ENDOSCOPY;  Service: Cardiovascular;  Laterality: N/A;    Current Medications: Current Meds  Medication Sig  . amiodarone (PACERONE) 200 MG tablet Take 1 tablet (200 mg total) by mouth daily.  Marland Kitchen ELIQUIS 5 MG TABS tablet TAKE 1 TABLET TWICE DAILY  . furosemide (LASIX) 20 MG tablet Take 1 tablet (20 mg total) by mouth daily.  . metoprolol tartrate (LOPRESSOR) 50 MG tablet Take 1 tablet (50 mg total) by mouth 2 (two) times daily.  . [DISCONTINUED] furosemide (LASIX) 20 MG tablet Take 1 tablet (20 mg total) by mouth daily. Appointment Required For Further Refills [phone]  . [DISCONTINUED] sacubitril-valsartan (ENTRESTO) 49-51 MG Take 1 tablet by mouth 2 (two) times daily.     Allergies:   Other  Social History   Socioeconomic History  . Marital status: Single    Spouse name: Not on file  . Number of children: Not on file  . Years of education: Not on file  . Highest education level: Not on file  Occupational History  . Occupation: unemployed  Tobacco Use  . Smoking status: Former Smoker    Packs/day: 2.00    Years: 30.00    Pack years: 60.00    Quit date: 02/08/2018    Years since quitting: 1.5  . Smokeless tobacco: Never Used  Vaping Use  . Vaping Use: Never used  Substance and Sexual Activity  . Alcohol use: No    Comment: quit drinking year ago  . Drug use: No  . Sexual activity: Not on file  Other Topics Concern  . Not on file  Social History Narrative  . Not on file   Social Determinants of Health   Financial Resource  Strain:   . Difficulty of Paying Living Expenses:   Food Insecurity:   . Worried About Programme researcher, broadcasting/film/video in the Last Year:   . Barista in the Last Year:   Transportation Needs:   . Freight forwarder (Medical):   Marland Kitchen Lack of Transportation (Non-Medical):   Physical Activity:   . Days of Exercise per Week:   . Minutes of Exercise per Session:   Stress:   . Feeling of Stress :   Social Connections:   . Frequency of Communication with Friends and Family:   . Frequency of Social Gatherings with Friends and Family:   . Attends Religious Services:   . Active Member of Clubs or Organizations:   . Attends Banker Meetings:   Marland Kitchen Marital Status:      Family History: The patient's family history includes Cancer in her mother; Heart disease in her father.  ROS:   Please see the history of present illness.     All other systems reviewed and are negative.  EKGs/Labs/Other Studies Reviewed:    The following studies were reviewed today:    Recent Labs: 07/20/2019: BUN 13; Creatinine, Ser 0.83; Potassium 3.7; Sodium 144  Recent Lipid Panel    Component Value Date/Time   CHOL 130 03/09/2018 0407   TRIG 63 03/09/2018 0407   HDL 36 (L) 03/09/2018 0407   CHOLHDL 3.6 03/09/2018 0407   VLDL 13 03/09/2018 0407   LDLCALC 81 03/09/2018 0407    Physical Exam:    Physical Exam: Blood pressure (!) 162/104, pulse (!) 57, height 5' 9.5" (1.765 m), weight 293 lb 6.4 oz (133.1 kg).  GEN:  Well nourished, well developed in no acute distress HEENT: Normal NECK: No JVD; No carotid bruits LYMPHATICS: No lymphadenopathy CARDIAC: RRR , no murmurs, rubs, gallops RESPIRATORY:  Clear to auscultation without rales, wheezing or rhonchi  ABDOMEN: Soft, non-tender, non-distended MUSCULOSKELETAL:  No edema; No deformity  SKIN: Warm and dry NEUROLOGIC:  Alert and oriented x 3   EKG: 09/07/2019: Sinus bradycardia.  No ST or T wave changes.  ASSESSMENT:    1. Other persistent  atrial fibrillation (HCC)    PLAN:    In order of problems listed above:  Atrial fibrillation:  paroxymsal ,    Cont amio,  Cont eliquis Sh is in NSR today    2.  Chronic systolic congestive heart failure:   She is noncompliant with her diet and smoking cessation. She eats only meat and potatoes.    Will not eat vegetables.  Still eats salty and salty foods.  I'm not optimistic that she will every get her BP and her CHF under control  Will increase her entresto to 97/103 PO BID. BMP in 3 weeks.  We had a long discussion about starting an exercise program, working on improved diet.  She also needs to watch her salt.  To see Vin in 6 months    Medication Adjustments/Labs and Tests Ordered: Current medicines are reviewed at length with the patient today.  Concerns regarding medicines are outlined above.  Orders Placed This Encounter  Procedures  . Basic metabolic panel  . EKG 12-Lead   Meds ordered this encounter  Medications  . furosemide (LASIX) 20 MG tablet    Sig: Take 1 tablet (20 mg total) by mouth daily.    Dispense:  90 tablet    Refill:  3  . sacubitril-valsartan (ENTRESTO) 97-103 MG    Sig: Take 1 tablet by mouth 2 (two) times daily.    Dispense:  180 tablet    Refill:  3    Patient Instructions  Medication Instructions:  1) INCREASE ENTRESTO to 97/103 mg twice daily. You may take 2 tablets twice daily of the pills you already have at the lower dose, but when you get your new prescription only take 1 pill twice daily. *If you need a refill on your cardiac medications before your next appointment, please call your pharmacy*  Lab Work: Your provider recommends that you return for lab work in: 3 weeks If you have labs (blood work) drawn today and your tests are completely normal, you will receive your results only by: Marland Kitchen MyChart Message (if you have MyChart) OR . A paper copy in the mail If you have any lab test that is abnormal or we need to change your  treatment, we will call you to review the results.  Follow-Up: At [facility], you and your health needs are our priority.  As part of our continuing mission to provide you with exceptional heart care, we have created designated Provider Care Teams.  These Care Teams include your primary Cardiologist (physician) and Advanced Practice Providers (APPs -  Physician Assistants and Nurse Practitioners) who all work together to provide you with the care you need, when you need it. Your next appointment:   6 month(s) The format for your next appointment:   In Person Provider:   Chelsea Aus, PA-C     Signed, Kristeen Miss, MD  09/07/2019 4:38 PM    [facility]

## 2019-09-07 NOTE — Patient Instructions (Signed)
Medication Instructions:  1) INCREASE ENTRESTO to 97/103 mg twice daily. You may take 2 tablets twice daily of the pills you already have at the lower dose, but when you get your new prescription only take 1 pill twice daily. *If you need a refill on your cardiac medications before your next appointment, please call your pharmacy*  Lab Work: Your provider recommends that you return for lab work in: 3 weeks If you have labs (blood work) drawn today and your tests are completely normal, you will receive your results only by: Marland Kitchen MyChart Message (if you have MyChart) OR . A paper copy in the mail If you have any lab test that is abnormal or we need to change your treatment, we will call you to review the results.  Follow-Up: At [facility], you and your health needs are our priority.  As part of our continuing mission to provide you with exceptional heart care, we have created designated Provider Care Teams.  These Care Teams include your primary Cardiologist (physician) and Advanced Practice Providers (APPs -  Physician Assistants and Nurse Practitioners) who all work together to provide you with the care you need, when you need it. Your next appointment:   6 month(s) The format for your next appointment:   In Person Provider:   Chelsea Aus, PA-C

## 2019-09-28 ENCOUNTER — Other Ambulatory Visit: Payer: Medicare Other

## 2019-10-15 ENCOUNTER — Other Ambulatory Visit: Payer: Medicare Other

## 2019-11-13 ENCOUNTER — Other Ambulatory Visit: Payer: Medicare Other

## 2019-11-16 ENCOUNTER — Other Ambulatory Visit: Payer: Medicare Other

## 2019-11-16 ENCOUNTER — Other Ambulatory Visit: Payer: Self-pay

## 2019-11-16 DIAGNOSIS — I4819 Other persistent atrial fibrillation: Secondary | ICD-10-CM | POA: Diagnosis not present

## 2019-11-16 LAB — BASIC METABOLIC PANEL
BUN/Creatinine Ratio: 21 (ref 12–28)
BUN: 19 mg/dL (ref 8–27)
CO2: 20 mmol/L (ref 20–29)
Calcium: 9.3 mg/dL (ref 8.7–10.3)
Chloride: 107 mmol/L — ABNORMAL HIGH (ref 96–106)
Creatinine, Ser: 0.89 mg/dL (ref 0.57–1.00)
GFR calc Af Amer: 81 mL/min/{1.73_m2} (ref >59)
GFR calc non Af Amer: 71 mL/min/{1.73_m2} (ref >59)
Glucose: 110 mg/dL — ABNORMAL HIGH (ref 65–99)
Potassium: 4 mmol/L (ref 3.5–5.2)
Sodium: 144 mmol/L (ref 134–144)

## 2019-11-17 ENCOUNTER — Other Ambulatory Visit: Payer: Self-pay | Admitting: Cardiovascular Disease

## 2019-11-17 ENCOUNTER — Other Ambulatory Visit (HOSPITAL_COMMUNITY): Payer: Self-pay | Admitting: Physician Assistant

## 2019-11-18 NOTE — Telephone Encounter (Signed)
This pt is still a A-Fib clinic pt. Pt is supposed to F/U with A-Fib clinic in 4 months, has pt been released from A-Fib clinic? Please address

## 2019-12-05 ENCOUNTER — Other Ambulatory Visit (HOSPITAL_COMMUNITY): Payer: Self-pay | Admitting: Physician Assistant

## 2019-12-05 DIAGNOSIS — I4819 Other persistent atrial fibrillation: Secondary | ICD-10-CM

## 2019-12-07 NOTE — Telephone Encounter (Signed)
Eliquis 5mg refill request received. Patient is 59 years old, weight-133.1kg, Crea-0.89 on 11/16/2019, Diagnosis-Afib, and last seen by Dr. Nahser on 09/07/2019. Dose is appropriate based on dosing criteria. Will send in refill to requested pharmacy.   

## 2019-12-18 ENCOUNTER — Telehealth: Payer: Self-pay | Admitting: Cardiovascular Disease

## 2019-12-18 NOTE — Telephone Encounter (Signed)
Attempted to call number given to let them know paperwork received and in Nahser's basket. Number to call mentions an access code, which I wasn't given.  Unable to reach/speak to live person.  Voice recording hangs up automatically after short time...Marland KitchenMarland Kitchen

## 2019-12-18 NOTE — Telephone Encounter (Signed)
Sam is calling wanting to know if the fax sent in regards to the patient's genetic testing has been received. Please advise.

## 2019-12-21 NOTE — Telephone Encounter (Signed)
Joanna [name] with [employer] called to follow up regarding whether or not we received fax. I confirmed that we did receive the paperwork and that it was dropped off in Dr. Harvie Bridge basket. I also verified that their callback number was correct and Joanna [name] stated there should not be an access code.   Phone#: [phone]

## 2019-12-22 NOTE — Telephone Encounter (Signed)
I called the pt in re: to the paperwork that was sent to Dr. Elease Hashimoto from [facility] for cardiovascular genetic testing.... Dr. Elease Hashimoto is not going to sign off on the testing.. we can refer her to Dr. Jomarie Longs for Genetic testing if she is wanting that to be done but otherwise they can talk with her PCP.. I  Attempted to call Weston Brass with [employer] back but it is still asking for an ACCESS CODE......Marland Kitchen we will need approval from the pt to speak with him due to HIPAA regulation.    The pts brother answered the phone and says that she is busy that we will have to call back at another time.

## 2019-12-25 ENCOUNTER — Telehealth: Payer: Self-pay | Admitting: Cardiovascular Disease

## 2019-12-25 NOTE — Telephone Encounter (Signed)
See 11/12 phone note.  Bertram Millard, RN  4:41 PM Note I called the pt in re: to the paperwork that was sent to Dr. Elease Hashimoto from [facility] for cardiovascular genetic testing.... Dr. Elease Hashimoto is not going to sign off on the testing.. we can refer her to Dr. Jomarie Longs for Genetic testing if she is wanting that to be done but otherwise they can talk with her PCP.. I  Attempted to call Weston Brass with [employer] back but it is still asking for an ACCESS CODE......Marland Kitchen we will need approval from the pt to speak with him due to HIPAA regulation.    The pts brother answered the phone and says that she is busy that we will have to call back at another time.       The patient has not called back.  Attempted to call [employer] to notify Dr. Elease Hashimoto will not sign off on genetic testing but we still do not have an access code.

## 2019-12-25 NOTE — Telephone Encounter (Signed)
[employer] calling to see if the genetic testing kit they faxed was received.

## 2019-12-30 NOTE — Telephone Encounter (Signed)
[employer] called back in requesting an update on the status of this form. They asked several times if we received the form and when it would be sent back with Dr. Harvie Bridge signature. I reiterated that we have attempted to contact them and have been unsuccessful due to the lack of an access code and requested that she have Britta Mccreedy contact our office in regards to this matter. [employer] stated that they do not have an access code to provide. Please call the patient in regards to this matter.  Thank you!

## 2019-12-30 NOTE — Telephone Encounter (Signed)
Attempted to call [employer] back, automated message requesting access code, no other options offered.   Called the patient to gain more information, she states she was not aware of any genetic testing and has not been trying to get paperwork signed by Dr. Elease Hashimoto. She is unfamiliar with [employer] and states she does not need any paperwork signed/approved by our office.

## 2020-01-19 DIAGNOSIS — Z23 Encounter for immunization: Secondary | ICD-10-CM | POA: Diagnosis not present

## 2020-02-03 ENCOUNTER — Other Ambulatory Visit (HOSPITAL_COMMUNITY): Payer: Self-pay | Admitting: Physician Assistant

## 2020-04-06 ENCOUNTER — Other Ambulatory Visit: Payer: Self-pay | Admitting: Cardiovascular Disease

## 2020-05-02 ENCOUNTER — Encounter: Payer: Self-pay | Admitting: Cardiovascular Disease

## 2020-05-02 NOTE — Progress Notes (Signed)
This encounter was created in error - please disregard.

## 2020-05-03 ENCOUNTER — Encounter: Payer: Medicare Other | Admitting: Cardiovascular Disease

## 2020-05-30 ENCOUNTER — Ambulatory Visit: Payer: Medicare Other | Admitting: Cardiovascular Disease

## 2020-06-30 ENCOUNTER — Other Ambulatory Visit (HOSPITAL_COMMUNITY): Payer: Self-pay | Admitting: Physician Assistant

## 2020-07-05 ENCOUNTER — Telehealth: Payer: Self-pay | Admitting: Cardiovascular Disease

## 2020-07-05 NOTE — Telephone Encounter (Signed)
    Pt c/o medication issue:  1. Name of Medication:   amiodarone (PACERONE) 200 MG tablet    2. How are you currently taking this medication (dosage and times per day)? TAKE 1 TABLET (200 MG TOTAL) DAILY (MUST MAKE APPOINTMENT CALL [phone] FOR REFILLS)  3. Are you having a reaction (difficulty breathing--STAT)?   4. What is your medication issue? Pt said [employer] called her this morning and was advised to call us about her meds, she said [facility] said to tell us to call [employer]. She did provide her appt time and date to them but [employer] kept calling her, she requested if Dr. Harvie Bridge nurse can call Francine Graven

## 2020-07-06 ENCOUNTER — Other Ambulatory Visit: Payer: Self-pay

## 2020-07-06 DIAGNOSIS — I4819 Other persistent atrial fibrillation: Secondary | ICD-10-CM

## 2020-07-06 MED ORDER — FUROSEMIDE 20 MG PO TABS: 20.0000 mg | ORAL_TABLET | Freq: Every day | ORAL | 0 refills | Status: DC

## 2020-07-07 ENCOUNTER — Other Ambulatory Visit: Payer: Self-pay

## 2020-07-07 MED ORDER — SACUBITRIL-VALSARTAN 97-103 MG PO TABS: 1.0000 | ORAL_TABLET | Freq: Two times a day (BID) | ORAL | 3 refills | Status: DC

## 2020-07-14 ENCOUNTER — Other Ambulatory Visit: Payer: Self-pay | Admitting: Cardiovascular Disease

## 2020-07-14 DIAGNOSIS — I4819 Other persistent atrial fibrillation: Secondary | ICD-10-CM

## 2020-07-14 NOTE — Telephone Encounter (Signed)
Eliquis 5mg  refill request received. Patient is 60 years old, weight-133.1kg, Crea-0.89 on 11/16/2019, Diagnosis-Afib, and last seen by Dr. 01/16/2020 on 09/07/2019. Dose is appropriate based on dosing criteria. Will send in refill to requested pharmacy.

## 2020-07-22 NOTE — Telephone Encounter (Signed)
RN attempted to contact [employer] per patient request, unable to reach anyone after 31 minutes. Call back number left for return.

## 2020-07-28 ENCOUNTER — Other Ambulatory Visit: Payer: Self-pay | Admitting: Cardiovascular Disease

## 2020-07-28 DIAGNOSIS — I4819 Other persistent atrial fibrillation: Secondary | ICD-10-CM

## 2020-08-01 DIAGNOSIS — Z1152 Encounter for screening for COVID-19: Secondary | ICD-10-CM | POA: Diagnosis not present

## 2020-08-01 NOTE — Progress Notes (Signed)
Cardiology Office Note    Date:  08/03/2020   ID:  Joanna [name], DOB Jun 21, 1960, MRN [id number]   PCP:  Marcine Matar, MD   [facility]  Cardiologist:  Kristeen Miss, MD   Advanced Practice Provider:  No care team member to display Electrophysiologist:  None   [phone]   Chief Complaint  Patient presents with   Follow-up     History of Present Illness:  Joanna [name] is a 60 y.o. female with history of paroxysmal atrial fibrillation on amiodarone and Eliquis, chronic combined systolic and diastolic CHF, hypertension, eats a high salt diet.  TEE 03/14/2018 LVEF 25-30 % with moderate MR  Last saw Dr. Elease Hashimoto 09/2019 and BP high-eating high salt diet, Entresto increased 97/103 mg bid.  Patient comes in for f/u. Denies chest pain, dyspnea, edema, palpitations, dizziness or presyncope. Started walking 3-4 blocks twice a day and stretching exercises. Started smoking 1/3 ppd again after stopping for a year because of bad living situation. Using Mrs. Dash and trying to watch salt better.   Past Medical History:  Diagnosis Date   Atrial fibrillation with RVR (HCC)    Chronic combined systolic (congestive) and diastolic (congestive) heart failure (HCC)    Dyspnea    Goiter    Hypertension    NICM (nonischemic cardiomyopathy) (HCC)    a. 08/2005 Echo: EF 30-35%, mod diff HK. Mild to Mod MR. Mildly dil LA; b. 08/2005 Cath: Nl Cors. Elevated CO w/o shunt; c. 09/2007 Echo: EF 45%. Mild to mod MR; c. 03/2015 Echo: EF 45%, global HK. Gr1 DD. Mild MR. Mildly dil LA.    Past Surgical History:  Procedure Laterality Date   APPENDECTOMY     BACK SURGERY     CARDIOVERSION N/A 03/14/2018   Procedure: CARDIOVERSION;  Surgeon: Pricilla Riffle, MD;  Location: [facility] ENDOSCOPY;  Service: Cardiovascular;  Laterality: N/A;   TEE WITHOUT CARDIOVERSION N/A 03/14/2018   Procedure: TRANSESOPHAGEAL ECHOCARDIOGRAM (TEE);  Surgeon: Pricilla Riffle, MD;  Location: [facility] ENDOSCOPY;  Service:  Cardiovascular;  Laterality: N/A;    Current Medications: Current Meds  Medication Sig   amiodarone (PACERONE) 200 MG tablet TAKE 1 TABLET (200 MG TOTAL) DAILY (MUST MAKE APPOINTMENT CALL [phone] FOR REFILLS)   apixaban (ELIQUIS) 5 MG TABS tablet TAKE 1 TABLET BY MOUTH TWICE DAILY   furosemide (LASIX) 20 MG tablet TAKE 1 TABLET (20 MG TOTAL) DAILY. PLEASE KEEP UPCOMING APPT IN JUNE 2022 BEFORE ANYMORE REFILLS. THANK YOU FINAL ATTEMPT   metoprolol tartrate (LOPRESSOR) 50 MG tablet TAKE 1 TABLET (50 MG TOTAL) BY MOUTH 2 (TWO) TIMES DAILY.   sacubitril-valsartan (ENTRESTO) 97-103 MG Take 1 tablet by mouth 2 (two) times daily.     Allergies:   Other   Social History   Socioeconomic History   Marital status: Single    Spouse name: Not on file   Number of children: Not on file   Years of education: Not on file   Highest education level: Not on file  Occupational History   Occupation: unemployed  Tobacco Use   Smoking status: Former    Packs/day: 2.00    Years: 30.00    Pack years: 60.00    Types: Cigarettes    Quit date: 02/08/2018    Years since quitting: 2.4   Smokeless tobacco: Never  Vaping Use   Vaping Use: Never used  Substance and Sexual Activity   Alcohol use: No    Comment: quit drinking year ago  Drug use: No   Sexual activity: Not on file  Other Topics Concern   Not on file  Social History Narrative   Not on file   Social Determinants of Health   Financial Resource Strain: Not on file  Food Insecurity: Not on file  Transportation Needs: Not on file  Physical Activity: Not on file  Stress: Not on file  Social Connections: Not on file     Family History:  The patient's  family history includes Cancer in her mother; Heart disease in her father.   ROS:   Please see the history of present illness.    ROS All other systems reviewed and are negative.   PHYSICAL EXAM:   VS:  BP 140/88   Pulse 62   Ht 5\' 10"  (1.778 m)   Wt 218 lb 3.2 oz (99 kg)    SpO2 99%   BMI 31.31 kg/m   Physical Exam  GEN: Obese, in no acute distress  Neck: no JVD, carotid bruits, or masses Cardiac:RRR; 2/6 systolic murmur at the left sternal border Respiratory: decreased breath sounds throughout with crackles at the bases  GI: soft, nontender, nondistended, + BS Ext: without cyanosis, clubbing, or edema, Good distal pulses bilaterally Neuro:  Alert and Oriented x 3 Psych: euthymic mood, full affect  Wt Readings from Last 3 Encounters:  08/03/20 218 lb 3.2 oz (99 kg)  09/07/19 293 lb 6.4 oz (133.1 kg)  02/16/19 235 lb (106.6 kg)      Studies/Labs Reviewed:   EKG:  EKG is  ordered today.  The ekg ordered today demonstrates NSR with nonspecific ST-T wave changes, no change from prior tracing  Recent Labs: 11/16/2019: BUN 19; Creatinine, Ser 0.89; Potassium 4.0; Sodium 144   Lipid Panel    Component Value Date/Time   CHOL 130 03/09/2018 0407   TRIG 63 03/09/2018 0407   HDL 36 (L) 03/09/2018 0407   CHOLHDL 3.6 03/09/2018 0407   VLDL 13 03/09/2018 0407   LDLCALC 81 03/09/2018 0407    Additional studies/ records that were reviewed today include:  2D echo 03/14/2018 IMPRESSIONS     1. The right ventricle has severely reduced systolic function.   2. Small pericardial effusion.   3. The mitral valve is normal in structure. Mitral valve regurgitation is  moderate by color flow Doppler.   4. The tricuspid valve was normal in structure.   5. The aortic valve is tricuspid.   6. The pulmonic valve was normal in structure.   7. No LA appendage thrombus.   8. No PFO by color doppler.   9. The left ventricle has severely reduced systolic function of 25-30%.   FINDINGS   Left Ventricle: The left ventricle has severely reduced systolic function  of 25-30%.  Right Ventricle: The right ventricle has severely reduced systolic  function.  Pericardium: A small pericardial effusion is present.  Mitral Valve: The mitral valve is normal in structure. Mitral  valve  regurgitation is moderate by color flow Doppler.  Tricuspid Valve: The tricuspid valve was normal in structure. Tricuspid  valve regurgitation is trivial by color flow Doppler.  Aortic Valve: The aortic valve is tricuspid.  Pulmonic Valve: The pulmonic valve was normal in structure.  Aorta: There is evidence of plaque in the descending aorta.    RIGHT ATRIUM  RA Pressure: 3 mmHg     Dietrich Pates MD  Electronically signed by Dietrich Pates MD  Signature Date/Time: 03/14/2018/6:14:11 PM      Risk Assessment/Calculations:  CHA2DS2-VASc Score = 3  This indicates a 3.2% annual risk of stroke. The patient's score is based upon: CHF History: Yes HTN History: Yes Diabetes History: No Stroke History: No Vascular Disease History: No Age Score: 0 Gender Score: 1       ASSESSMENT:    1. Paroxysmal atrial fibrillation (HCC)   2. Chronic combined systolic and diastolic CHF (congestive heart failure) (HCC)   3. Mitral valve insufficiency, unspecified etiology   4. Essential hypertension   5. Tobacco abuse   6. Morbid obesity (HCC)      PLAN:  In order of problems listed above:  Paroxysmal atrial fibrillation on amiodarone and Eliquis maintaining normal sinus rhythm.  We will check surveillance labs today.  No bleeding on Eliquis.  Chronic systolic/diastolic CHF EF 25% on echo in 2020 well compensated today.  Trying to decrease her salt in her diet.  We will follow-up echo to see if improvement  Moderate MR on TEE 2020 repeating echo   Hypertension blood pressure much better controlled.  Patient started exercising and has lost some weight.  Trying to watch salt as well.  Continue current treatment.  Tobacco abuse unfortunately patient has started smoking again after stopping for over a year.  Smoking cessation discussed.  She will try Nicorette gum.  Obesity patient has lost some weight.  Unclear how much his weight last office visit may not be accurate.  Shared Decision  Making/Informed Consent        Medication Adjustments/Labs and Tests Ordered: Current medicines are reviewed at length with the patient today.  Concerns regarding medicines are outlined above.  Medication changes, Labs and Tests ordered today are listed in the Patient Instructions below. Patient Instructions  Medication Instructions:  Your physician recommends that you continue on your current medications as directed. Please refer to the Current Medication list given to you today.  *If you need a refill on your cardiac medications before your next appointment, please call your pharmacy*   Lab Work: TODAY: CMET, CBC, TSH, FLP  If you have labs (blood work) drawn today and your tests are completely normal, you will receive your results only by: MyChart Message (if you have MyChart) OR A paper copy in the mail If you have any lab test that is abnormal or we need to change your treatment, we will call you to review the results.   Testing/Procedures: Your physician has requested that you have an echocardiogram. Echocardiography is a painless test that uses sound waves to create images of your heart. It provides your doctor with information about the size and shape of your heart and how well your heart's chambers and valves are working. This procedure takes approximately one hour. There are no restrictions for this procedure.   Follow-Up: At [facility], you and your health needs are our priority.  As part of our continuing mission to provide you with exceptional heart care, we have created designated Provider Care Teams.  These Care Teams include your primary Cardiologist (physician) and Advanced Practice Providers (APPs -  Physician Assistants and Nurse Practitioners) who all work together to provide you with the care you need, when you need it.  We recommend signing up for the patient portal called "MyChart".  Sign up information is provided on this After Visit Summary.  MyChart is used  to connect with patients for Virtual Visits (Telemedicine).  Patients are able to view lab/test results, encounter notes, upcoming appointments, etc.  Non-urgent messages can be sent to your provider  as well.   To learn more about what you can do with MyChart, go to [email or web address].    Your next appointment:   6 month(s)  The format for your next appointment:   In Person  Provider:   You may see Kristeen Miss, MD or one of the following Advanced Practice Providers on your designated Care Team:   Tereso Newcomer, PA-C Vin Anon Raices, [place]   Other Instructions Managing the Challenge of Quitting Smoking Quitting smoking is a physical and mental challenge. You will face cravings, withdrawal symptoms, and temptation. Before quitting, work with your health care provider to make a plan that can help you manage quitting. Preparation canhelp you quit and keep you from giving in. How to manage lifestyle changes Managing stress Stress can make you want to smoke, and wanting to smoke may cause stress. It is important to find ways to manage your stress. You might try some of the following: Practice relaxation techniques. Breathe slowly and deeply, in through your nose and out through your mouth. Listen to music. Soak in a bath or take a shower. Imagine a peaceful place or vacation. Get some support. Talk with family or friends about your stress. Join a support group. Talk with a counselor or therapist. Get some physical activity. Go for a walk, run, or bike ride. Play a favorite sport. Practice yoga.  Medicines Talk with your health care provider about medicines that might help you dealwith cravings and make quitting easier for you. Relationships Social situations can be difficult when you are quitting smoking. To manage this, you can: Avoid parties and other social situations where people might be smoking. Avoid alcohol. Leave right away if you have the urge to smoke. Explain to your  family and friends that you are quitting smoking. Ask for support and let them know you might be a bit grumpy. Plan activities where smoking is not an option. General instructions Be aware that many people gain weight after they quit smoking. However, not everyone does. To keep from gaining weight, have a plan in place before you quit and stick to the plan after you quit. Your plan should include: Having healthy snacks. When you have a craving, it may help to: Eat popcorn, carrots, celery, or other cut vegetables. Chew sugar-free gum. Changing how you eat. Eat small portion sizes at meals. Eat 4-6 small meals throughout the day instead of 1-2 large meals a day. Be mindful when you eat. Do not watch television or do other things that might distract you as you eat. Exercising regularly. Make time to exercise each day. If you do not have time for a long workout, do short bouts of exercise for 5-10 minutes several times a day. Do some form of strengthening exercise, such as weight lifting. Do some exercise that gets your heart beating and causes you to breathe deeply, such as walking fast, running, swimming, or biking. This is very important. Drinking plenty of water or other low-calorie or no-calorie drinks. Drink 6-8 glasses of water daily.  How to recognize withdrawal symptoms Your body and mind may experience discomfort as you try to get used to not having nicotine in your system. These effects are called withdrawal symptoms. They may include: Feeling hungrier than normal. Having trouble concentrating. Feeling irritable or restless. Having trouble sleeping. Feeling depressed. Craving a cigarette. To manage withdrawal symptoms: Avoid places, people, and activities that trigger your cravings. Remember why you want to quit. Get plenty of sleep. Avoid coffee and other caffeinated  drinks. These may worsen some of your symptoms. These symptoms may surprise you. But be assured that they are  normal to havewhen quitting smoking. How to manage cravings Come up with a plan for how to deal with your cravings. The plan should include the following: A definition of the specific situation you want to deal with. An alternative action you will take. A clear idea for how this action will help. The name of someone who might help you with this. Cravings usually last for 5-10 minutes. Consider taking the following actions to help you with your plan to deal with cravings: Keep your mouth busy. Chew sugar-free gum. Suck on hard candies or a straw. Brush your teeth. Keep your hands and body busy. Change to a different activity right away. Squeeze or play with a ball. Do an activity or a hobby, such as making bead jewelry, practicing needlepoint, or working with wood. Mix up your normal routine. Take a short exercise break. Go for a quick walk or run up and down stairs. Focus on doing something kind or helpful for someone else. Call a friend or family member to talk during a craving. Join a support group. Contact a quitline. Where to find support To get help or find a support group: Call the National Cancer Institute's Smoking Quitline: 1-800-QUIT NOW 609-830-4054) Visit the website of the Substance Abuse and Mental Health Services Administration: SkateOasis.com.pt Text QUIT to SmokefreeTXT: 923300 Where to find more information Visit these websites to find more information on quitting smoking: National Cancer Institute: www.smokefree.gov American Lung Association: www.lung.org American Cancer Society: www.cancer.org Centers for Disease Control and Prevention: FootballExhibition.com.br American Heart Association: www.heart.org Contact a health care provider if: You want to change your plan for quitting. The medicines you are taking are not helping. Your eating feels out of control or you cannot sleep. Get help right away if: You feel depressed or become very anxious. Summary Quitting smoking is a  physical and mental challenge. You will face cravings, withdrawal symptoms, and temptation to smoke again. Preparation can help you as you go through these challenges. Try different techniques to manage stress, handle social situations, and prevent weight gain. You can deal with cravings by keeping your mouth busy (such as by chewing gum), keeping your hands and body busy, calling family or friends, or contacting a quitline for people who want to quit smoking. You can deal with withdrawal symptoms by avoiding places where people smoke, getting plenty of rest, and avoiding drinks with caffeine. This information is not intended to replace advice given to you by your health care provider. Make sure you discuss any questions you have with your healthcare provider. Document Revised: 11/11/2018 Document Reviewed: 11/11/2018 Elsevier Patient Education  [address]     Signed, Jacolyn Reedy, [place]  08/03/2020 8:09 AM    [facility] [address] [place], [place], [place]  [address] Phone: [phone]; Fax: [phone]

## 2020-08-03 ENCOUNTER — Other Ambulatory Visit: Payer: Self-pay

## 2020-08-03 ENCOUNTER — Ambulatory Visit (INDEPENDENT_AMBULATORY_CARE_PROVIDER_SITE_OTHER): Payer: Medicare Other | Admitting: Physician Assistant

## 2020-08-03 ENCOUNTER — Encounter (INDEPENDENT_AMBULATORY_CARE_PROVIDER_SITE_OTHER): Payer: Self-pay

## 2020-08-03 ENCOUNTER — Encounter: Payer: Self-pay | Admitting: Physician Assistant

## 2020-08-03 VITALS — BP 140/88 | HR 62 | Ht 70.0 in | Wt 218.2 lb

## 2020-08-03 DIAGNOSIS — Z720 Tobacco use: Secondary | ICD-10-CM

## 2020-08-03 DIAGNOSIS — I340 Nonrheumatic mitral (valve) insufficiency: Secondary | ICD-10-CM | POA: Diagnosis not present

## 2020-08-03 DIAGNOSIS — I5042 Chronic combined systolic (congestive) and diastolic (congestive) heart failure: Secondary | ICD-10-CM | POA: Diagnosis not present

## 2020-08-03 DIAGNOSIS — I10 Essential (primary) hypertension: Secondary | ICD-10-CM | POA: Diagnosis not present

## 2020-08-03 DIAGNOSIS — I480 Paroxysmal atrial fibrillation: Secondary | ICD-10-CM

## 2020-08-03 LAB — TSH: TSH: 2.57 u[IU]/mL (ref 0.450–4.500)

## 2020-08-03 LAB — CBC
Hematocrit: 47.2 % — ABNORMAL HIGH (ref 34.0–46.6)
Hemoglobin: 15.3 g/dL (ref 11.1–15.9)
MCH: 28.5 pg (ref 26.6–33.0)
MCHC: 32.4 g/dL (ref 31.5–35.7)
MCV: 88 fL (ref 79–97)
Platelets: 386 10*3/uL (ref 150–450)
RBC: 5.36 x10E6/uL — ABNORMAL HIGH (ref 3.77–5.28)
RDW: 11.4 % — ABNORMAL LOW (ref 11.7–15.4)
WBC: 7.4 10*3/uL (ref 3.4–10.8)

## 2020-08-03 LAB — COMPREHENSIVE METABOLIC PANEL
ALT: 6 IU/L (ref 0–32)
AST: 7 IU/L (ref 0–40)
Albumin/Globulin Ratio: 1.2 (ref 1.2–2.2)
Albumin: 4.5 g/dL (ref 3.8–4.9)
Alkaline Phosphatase: 93 IU/L (ref 44–121)
BUN/Creatinine Ratio: 14 (ref 12–28)
BUN: 14 mg/dL (ref 8–27)
Bilirubin Total: 0.4 mg/dL (ref 0.0–1.2)
CO2: 22 mmol/L (ref 20–29)
Calcium: 9.9 mg/dL (ref 8.7–10.3)
Chloride: 103 mmol/L (ref 96–106)
Creatinine, Ser: 0.97 mg/dL (ref 0.57–1.00)
Globulin, Total: 3.8 g/dL (ref 1.5–4.5)
Glucose: 108 mg/dL — ABNORMAL HIGH (ref 65–99)
Potassium: 4.1 mmol/L (ref 3.5–5.2)
Sodium: 139 mmol/L (ref 134–144)
Total Protein: 8.3 g/dL (ref 6.0–8.5)
eGFR: 67 mL/min/{1.73_m2} (ref >59)

## 2020-08-03 LAB — LIPID PANEL
Chol/HDL Ratio: 4.3 ratio (ref 0.0–4.4)
Cholesterol, Total: 176 mg/dL (ref 100–199)
HDL: 41 mg/dL (ref >39)
LDL Chol Calc (NIH): 109 mg/dL — ABNORMAL HIGH (ref 0–99)
Triglycerides: 145 mg/dL (ref 0–149)
VLDL Cholesterol Cal: 26 mg/dL (ref 5–40)

## 2020-08-03 NOTE — Patient Instructions (Signed)
Medication Instructions:  Your physician recommends that you continue on your current medications as directed. Please refer to the Current Medication list given to you today.  *If you need a refill on your cardiac medications before your next appointment, please call your pharmacy*   Lab Work: TODAY: CMET, CBC, TSH, FLP  If you have labs (blood work) drawn today and your tests are completely normal, you will receive your results only by: MyChart Message (if you have MyChart) OR A paper copy in the mail If you have any lab test that is abnormal or we need to change your treatment, we will call you to review the results.   Testing/Procedures: Your physician has requested that you have an echocardiogram. Echocardiography is a painless test that uses sound waves to create images of your heart. It provides your doctor with information about the size and shape of your heart and how well your heart's chambers and valves are working. This procedure takes approximately one hour. There are no restrictions for this procedure.   Follow-Up: At [facility], you and your health needs are our priority.  As part of our continuing mission to provide you with exceptional heart care, we have created designated Provider Care Teams.  These Care Teams include your primary Cardiologist (physician) and Advanced Practice Providers (APPs -  Physician Assistants and Nurse Practitioners) who all work together to provide you with the care you need, when you need it.  We recommend signing up for the patient portal called "MyChart".  Sign up information is provided on this After Visit Summary.  MyChart is used to connect with patients for Virtual Visits (Telemedicine).  Patients are able to view lab/test results, encounter notes, upcoming appointments, etc.  Non-urgent messages can be sent to your provider as well.   To learn more about what you can do with MyChart, go to [email or web address].    Your next  appointment:   6 month(s)  The format for your next appointment:   In Person  Provider:   You may see Kristeen Miss, MD or one of the following Advanced Practice Providers on your designated Care Team:   Tereso Newcomer, PA-C [place], [place]   Other Instructions Managing the Challenge of Quitting Smoking Quitting smoking is a physical and mental challenge. You will face cravings, withdrawal symptoms, and temptation. Before quitting, work with your health care provider to make a plan that can help you manage quitting. Preparation canhelp you quit and keep you from giving in. How to manage lifestyle changes Managing stress Stress can make you want to smoke, and wanting to smoke may cause stress. It is important to find ways to manage your stress. You might try some of the following: Practice relaxation techniques. Breathe slowly and deeply, in through your nose and out through your mouth. Listen to music. Soak in a bath or take a shower. Imagine a peaceful place or vacation. Get some support. Talk with family or friends about your stress. Join a support group. Talk with a counselor or therapist. Get some physical activity. Go for a walk, run, or bike ride. Play a favorite sport. Practice yoga.  Medicines Talk with your health care provider about medicines that might help you dealwith cravings and make quitting easier for you. Relationships Social situations can be difficult when you are quitting smoking. To manage this, you can: Avoid parties and other social situations where people might be smoking. Avoid alcohol. Leave right away if you have the urge to smoke. Explain  to your family and friends that you are quitting smoking. Ask for support and let them know you might be a bit grumpy. Plan activities where smoking is not an option. General instructions Be aware that many people gain weight after they quit smoking. However, not everyone does. To keep from gaining weight, have a  plan in place before you quit and stick to the plan after you quit. Your plan should include: Having healthy snacks. When you have a craving, it may help to: Eat popcorn, carrots, celery, or other cut vegetables. Chew sugar-free gum. Changing how you eat. Eat small portion sizes at meals. Eat 4-6 small meals throughout the day instead of 1-2 large meals a day. Be mindful when you eat. Do not watch television or do other things that might distract you as you eat. Exercising regularly. Make time to exercise each day. If you do not have time for a long workout, do short bouts of exercise for 5-10 minutes several times a day. Do some form of strengthening exercise, such as weight lifting. Do some exercise that gets your heart beating and causes you to breathe deeply, such as walking fast, running, swimming, or biking. This is very important. Drinking plenty of water or other low-calorie or no-calorie drinks. Drink 6-8 glasses of water daily.  How to recognize withdrawal symptoms Your body and mind may experience discomfort as you try to get used to not having nicotine in your system. These effects are called withdrawal symptoms. They may include: Feeling hungrier than normal. Having trouble concentrating. Feeling irritable or restless. Having trouble sleeping. Feeling depressed. Craving a cigarette. To manage withdrawal symptoms: Avoid places, people, and activities that trigger your cravings. Remember why you want to quit. Get plenty of sleep. Avoid coffee and other caffeinated drinks. These may worsen some of your symptoms. These symptoms may surprise you. But be assured that they are normal to havewhen quitting smoking. How to manage cravings Come up with a plan for how to deal with your cravings. The plan should include the following: A definition of the specific situation you want to deal with. An alternative action you will take. A clear idea for how this action will help. The name  of someone who might help you with this. Cravings usually last for 5-10 minutes. Consider taking the following actions to help you with your plan to deal with cravings: Keep your mouth busy. Chew sugar-free gum. Suck on hard candies or a straw. Brush your teeth. Keep your hands and body busy. Change to a different activity right away. Squeeze or play with a ball. Do an activity or a hobby, such as making bead jewelry, practicing needlepoint, or working with wood. Mix up your normal routine. Take a short exercise break. Go for a quick walk or run up and down stairs. Focus on doing something kind or helpful for someone else. Call a friend or family member to talk during a craving. Join a support group. Contact a quitline. Where to find support To get help or find a support group: Call the National Cancer Institute's Smoking Quitline: 1-800-QUIT NOW (680)695-8421) Visit the website of the Substance Abuse and Mental Health Services Administration: SkateOasis.com.pt Text QUIT to SmokefreeTXT: 237628 Where to find more information Visit these websites to find more information on quitting smoking: National Cancer Institute: www.smokefree.gov American Lung Association: www.lung.org American Cancer Society: www.cancer.org Centers for Disease Control and Prevention: FootballExhibition.com.br American Heart Association: www.heart.org Contact a health care provider if: You want to change your  plan for quitting. The medicines you are taking are not helping. Your eating feels out of control or you cannot sleep. Get help right away if: You feel depressed or become very anxious. Summary Quitting smoking is a physical and mental challenge. You will face cravings, withdrawal symptoms, and temptation to smoke again. Preparation can help you as you go through these challenges. Try different techniques to manage stress, handle social situations, and prevent weight gain. You can deal with cravings by keeping your mouth  busy (such as by chewing gum), keeping your hands and body busy, calling family or friends, or contacting a quitline for people who want to quit smoking. You can deal with withdrawal symptoms by avoiding places where people smoke, getting plenty of rest, and avoiding drinks with caffeine. This information is not intended to replace advice given to you by your health care provider. Make sure you discuss any questions you have with your healthcare provider. Document Revised: 11/11/2018 Document Reviewed: 11/11/2018 Elsevier Patient Education  2022 [employer].

## 2020-08-22 ENCOUNTER — Other Ambulatory Visit (HOSPITAL_COMMUNITY): Payer: Medicare Other

## 2020-08-30 DIAGNOSIS — Z20822 Contact with and (suspected) exposure to covid-19: Secondary | ICD-10-CM | POA: Diagnosis not present

## 2020-09-05 DIAGNOSIS — Z20822 Contact with and (suspected) exposure to covid-19: Secondary | ICD-10-CM | POA: Diagnosis not present

## 2020-09-06 ENCOUNTER — Other Ambulatory Visit (HOSPITAL_COMMUNITY): Payer: Medicare Other

## 2020-09-21 ENCOUNTER — Other Ambulatory Visit: Payer: Self-pay | Admitting: Cardiovascular Disease

## 2020-09-21 DIAGNOSIS — I4819 Other persistent atrial fibrillation: Secondary | ICD-10-CM

## 2020-10-04 ENCOUNTER — Other Ambulatory Visit: Payer: Self-pay

## 2020-10-04 ENCOUNTER — Ambulatory Visit (HOSPITAL_COMMUNITY): Payer: Medicare Other | Attending: Cardiovascular Disease

## 2020-10-04 DIAGNOSIS — I340 Nonrheumatic mitral (valve) insufficiency: Secondary | ICD-10-CM | POA: Diagnosis not present

## 2020-10-04 DIAGNOSIS — I5042 Chronic combined systolic (congestive) and diastolic (congestive) heart failure: Secondary | ICD-10-CM | POA: Diagnosis not present

## 2020-10-04 DIAGNOSIS — I480 Paroxysmal atrial fibrillation: Secondary | ICD-10-CM | POA: Diagnosis not present

## 2020-10-04 LAB — ECHOCARDIOGRAM COMPLETE
Area-P 1/2: 2.34 cm2
S' Lateral: 4.2 cm

## 2020-10-19 DIAGNOSIS — Z20822 Contact with and (suspected) exposure to covid-19: Secondary | ICD-10-CM | POA: Diagnosis not present

## 2020-11-15 ENCOUNTER — Other Ambulatory Visit: Payer: Self-pay

## 2020-11-15 ENCOUNTER — Telehealth: Payer: Self-pay | Admitting: Cardiovascular Disease

## 2020-11-15 DIAGNOSIS — I4819 Other persistent atrial fibrillation: Secondary | ICD-10-CM

## 2020-11-15 MED ORDER — FUROSEMIDE 20 MG PO TABS: 20.0000 mg | ORAL_TABLET | Freq: Every day | ORAL | 2 refills | Status: DC

## 2020-11-15 NOTE — Telephone Encounter (Signed)
Attempted phone call to [phone].  This number is a Airline pilot number for various services such as car insurance and Internet as well as many other things.   Pt notified of phone call findings and advised she will need to contact insurance company and have them request her medications from [facility] mail order.  Pt verbalizes understanding and states she will call [employer].

## 2020-11-15 NOTE — Telephone Encounter (Signed)
Spoke with pt who states she received a phone call stating she now has [facility] for insurance and will need our office to confirm her medications with [phone].  Asked pt if this the mail order phone number for [facility] and she states she is unsure that is the number that she was provided.

## 2020-11-15 NOTE — Telephone Encounter (Signed)
Patient called to say that he has new insurance and they need our office to call them and tell them all of the patients medication. Its with [employer] [phone] and fax # [phone]. Please advise

## 2021-01-06 ENCOUNTER — Ambulatory Visit: Payer: Medicare Other | Admitting: Cardiovascular Disease

## 2021-01-09 ENCOUNTER — Telehealth: Payer: Self-pay | Admitting: Internal Medicine

## 2021-01-09 NOTE — Telephone Encounter (Signed)
Copied from CRM [id number]. Topic: General - Other >> Jan 09, 2021 12:26 PM Benton, Deedra Ehrich wrote: Reason for CRM:[facility] called about medical form they faxed over last week. Please call back if received.

## 2021-01-10 NOTE — Telephone Encounter (Signed)
Returned call to phone number listed lvm

## 2021-02-08 ENCOUNTER — Other Ambulatory Visit: Payer: Self-pay | Admitting: Cardiovascular Disease

## 2021-02-08 DIAGNOSIS — I4819 Other persistent atrial fibrillation: Secondary | ICD-10-CM

## 2021-02-08 MED ORDER — AMIODARONE HCL 200 MG PO TABS: ORAL_TABLET | ORAL | 1 refills | Status: DC

## 2021-02-08 MED ORDER — METOPROLOL TARTRATE 50 MG PO TABS: 50.0000 mg | ORAL_TABLET | Freq: Two times a day (BID) | ORAL | 1 refills | Status: DC

## 2021-02-08 MED ORDER — SACUBITRIL-VALSARTAN 97-103 MG PO TABS: 1.0000 | ORAL_TABLET | Freq: Two times a day (BID) | ORAL | 1 refills | Status: DC

## 2021-02-08 MED ORDER — APIXABAN 5 MG PO TABS: ORAL_TABLET | ORAL | 1 refills | Status: DC

## 2021-02-08 NOTE — Telephone Encounter (Signed)
*  STAT* If patient is at the pharmacy, call can be transferred to refill team.   1. Which medications need to be refilled? (please list name of each medication and dose if known)  amiodarone (PACERONE) 200 MG tablet apixaban (ELIQUIS) 5 MG TABS tablet metoprolol tartrate (LOPRESSOR) 50 MG tablet sacubitril-valsartan (ENTRESTO) 97-103 MG  2. Which pharmacy/location (including street and city if local pharmacy) is medication to be sent to? [facility] - [place], [place] - [address] [place]  3. Do they need a 30 day or 90 day supply?   90 day supply

## 2021-02-08 NOTE — Telephone Encounter (Signed)
Pt's medication was sent to pt's pharmacy as requested. Confirmation received.  °

## 2021-02-08 NOTE — Telephone Encounter (Signed)
Prescription refill request for Eliquis received. Indication: Afib  Last office visit:08/03/20 Geni Bers)  Scr: 0.97 (08/03/20)  Age: 61 Weight: 99kg  Appropriate dose and refill sent to requested pharmacy.

## 2021-02-27 ENCOUNTER — Ambulatory Visit: Payer: Medicare Other | Admitting: Cardiovascular Disease

## 2021-03-02 ENCOUNTER — Encounter: Payer: Self-pay | Admitting: Cardiovascular Disease

## 2021-03-02 NOTE — Progress Notes (Signed)
Cardiology Office Note:    Date:  03/03/2021   ID:  [name], DOB Mar 29, 1960, MRN [id number]  PCP:  Marcine Matar, MD  Cardiologist:  Kristeen Miss, MD  Electrophysiologist:  None   Referring MD: Marcine Matar, MD   Chief Complaint  Patient presents with   Congestive Heart Failure   Atrial Fibrillation         Problem list 1.  Chronic combined congestive heart failure 2.  Atrial fibrillation     History of Present Illness:    Joanna [name] is a 61 y.o. female with a hx of  Chronic combined CHF .    Seen with her sister.   EF is 25%.   Grade 2 diastolic dysfunction  She was hospitalized in Feb. 2020 with respiratory failure and AFib   TEE showed mod - severe MR .   Was in atrial fib at the time - was successfully cardioverted at the time   Was seen by Chelsea Aus, PA in Feb.  Was back in AFib at the time.  Started on amiodarone.  I had a telemedicine visit with her in June, 2020 Was feeling better on amiodarone  Feels fine.    She does not want to have another cardioversion but is willing to consider it.   Has an appt with EP clinic on Monday  Breathing is good.  Still eating lots of salty foods.   HR is very regular today .     September 07, 2019:  Joanna [name] is a 61 year old female with a history of chronic combined congestive heart failure, her ejection fractions around 25%.  She has grade 2 diastolic dysfunction.  She has persistent hypertension.  She eats a very high salt diet. Eats meats and potatoes,  Does not eat vegetables for the most part. Will eat green beans on occasion .   Has started back smoking .   Jan. 27, 2023 Joanna [name] is seen for follow up of her chronic combined CHF, HTN, ongoing cigarette smoking. Had previous LV EF  of 25% Echo in Aug. 30, 2022 - normal LV function , grade II DD    Hx of atrial fib , is maintaining NSR  Will reduce amio to 100 mg a day  Has reduced her salt intake  Wt is 214.8 lbs.    Has reduced  her cigarette smoking .     Past Medical History:  Diagnosis Date   Atrial fibrillation with RVR (HCC)    Chronic combined systolic (congestive) and diastolic (congestive) heart failure (HCC)    Dyspnea    Goiter    Hypertension    NICM (nonischemic cardiomyopathy) (HCC)    a. 08/2005 Echo: EF 30-35%, mod diff HK. Mild to Mod MR. Mildly dil LA; b. 08/2005 Cath: Nl Cors. Elevated CO w/o shunt; c. 09/2007 Echo: EF 45%. Mild to mod MR; c. 03/2015 Echo: EF 45%, global HK. Gr1 DD. Mild MR. Mildly dil LA.    Past Surgical History:  Procedure Laterality Date   APPENDECTOMY     BACK SURGERY     CARDIOVERSION N/A 03/14/2018   Procedure: CARDIOVERSION;  Surgeon: Pricilla Riffle, MD;  Location: [facility] ENDOSCOPY;  Service: Cardiovascular;  Laterality: N/A;   TEE WITHOUT CARDIOVERSION N/A 03/14/2018   Procedure: TRANSESOPHAGEAL ECHOCARDIOGRAM (TEE);  Surgeon: Pricilla Riffle, MD;  Location: [facility] ENDOSCOPY;  Service: Cardiovascular;  Laterality: N/A;    Current Medications: Current Meds  Medication Sig   amiodarone (PACERONE) 200 MG tablet Take 0.5  tablets (100 mg total) by mouth daily.   apixaban (ELIQUIS) 5 MG TABS tablet TAKE 1 TABLET BY MOUTH TWICE DAILY   furosemide (LASIX) 20 MG tablet Take 1 tablet (20 mg total) by mouth daily.   metoprolol tartrate (LOPRESSOR) 50 MG tablet Take 1 tablet (50 mg total) by mouth 2 (two) times daily.   sacubitril-valsartan (ENTRESTO) 97-103 MG Take 1 tablet by mouth 2 (two) times daily.   [DISCONTINUED] amiodarone (PACERONE) 200 MG tablet TAKE 1 TABLET (200 MG TOTAL) DAILY     Allergies:   Other   Social History   Socioeconomic History   Marital status: Single    Spouse name: Not on file   Number of children: Not on file   Years of education: Not on file   Highest education level: Not on file  Occupational History   Occupation: unemployed  Tobacco Use   Smoking status: Former    Packs/day: 2.00    Years: 30.00    Pack years: 60.00    Types: Cigarettes     Quit date: 02/08/2018    Years since quitting: 3.0   Smokeless tobacco: Never  Vaping Use   Vaping Use: Never used  Substance and Sexual Activity   Alcohol use: No    Comment: quit drinking year ago   Drug use: No   Sexual activity: Not on file  Other Topics Concern   Not on file  Social History Narrative   Not on file   Social Determinants of Health   Financial Resource Strain: Not on file  Food Insecurity: Not on file  Transportation Needs: Not on file  Physical Activity: Not on file  Stress: Not on file  Social Connections: Not on file     Family History: The patient's family history includes Cancer in her mother; Heart disease in her father.  ROS:   Please see the history of present illness.     All other systems reviewed and are negative.  EKGs/Labs/Other Studies Reviewed:    The following studies were reviewed today:    Recent Labs: 08/03/2020: ALT 6; BUN 14; Creatinine, Ser 0.97; Hemoglobin 15.3; Platelets 386; Potassium 4.1; Sodium 139; TSH 2.570  Recent Lipid Panel    Component Value Date/Time   CHOL 176 08/03/2020 0814   TRIG 145 08/03/2020 0814   HDL 41 08/03/2020 0814   CHOLHDL 4.3 08/03/2020 0814   CHOLHDL 3.6 03/09/2018 0407   VLDL 13 03/09/2018 0407   LDLCALC 109 (H) 08/03/2020 0814    Physical Exam:    Physical Exam: Blood pressure (!) 142/88, pulse 74, height 5\' 10"  (1.778 m), weight 214 lb 12.8 oz (97.4 kg), SpO2 98 %.  GEN:  Well nourished, well developed in no acute distress HEENT: Normal NECK: No JVD; No carotid bruits LYMPHATICS: No lymphadenopathy CARDIAC: RRR , no murmurs, rubs, gallops RESPIRATORY:  Clear to auscultation without rales, wheezing or rhonchi  ABDOMEN: Soft, non-tender, non-distended MUSCULOSKELETAL:  No edema; No deformity  SKIN: Warm and dry NEUROLOGIC:  Alert and oriented x 3    EKG:   ASSESSMENT:    1. Paroxysmal atrial fibrillation (HCC)   2. Essential hypertension   3. Medication management   4.  Acute systolic (congestive) heart failure (HCC)   5. Chronic combined systolic and diastolic CHF (congestive heart failure) (HCC)     PLAN:      Atrial fibrillation:  paroxymsal ,    Will reduce amio to 100 mg a day  Check tsh, BMP , CBC  today  On eliquis     2.  Chronic systolic congestive heart failure:   She is noncompliant with her diet and smoking cessation. She eats only meat and potatoes.    Will not eat vegetables.  Still eats salty and salty foods.       Medication Adjustments/Labs and Tests Ordered: Current medicines are reviewed at length with the patient today.  Concerns regarding medicines are outlined above.  Orders Placed This Encounter  Procedures   Basic metabolic panel   TSH   CBC   Meds ordered this encounter  Medications   amiodarone (PACERONE) 200 MG tablet    Sig: Take 0.5 tablets (100 mg total) by mouth daily.    Dispense:  45 tablet    Refill:  3    Patient Instructions  Medication Instructions:  Your physician has recommended you make the following change in your medication:   REDUCE the Amiodarone to 200 mg taking 1/2 tablet  daily   *If you need a refill on your cardiac medications before your next appointment, please call your pharmacy*   Lab Work: TODAY:  BMP, TSH, & CBC  If you have labs (blood work) drawn today and your tests are completely normal, you will receive your results only by: MyChart Message (if you have MyChart) OR A paper copy in the mail If you have any lab test that is abnormal or we need to change your treatment, we will call you to review the results.   Testing/Procedures: None ordered   Follow-Up: At [facility], you and your health needs are our priority.  As part of our continuing mission to provide you with exceptional heart care, we have created designated Provider Care Teams.  These Care Teams include your primary Cardiologist (physician) and Advanced Practice Providers (APPs -  Physician Assistants  and Nurse Practitioners) who all work together to provide you with the care you need, when you need it.  We recommend signing up for the patient portal called "MyChart".  Sign up information is provided on this After Visit Summary.  MyChart is used to connect with patients for Virtual Visits (Telemedicine).  Patients are able to view lab/test results, encounter notes, upcoming appointments, etc.  Non-urgent messages can be sent to your provider as well.   To learn more about what you can do with MyChart, go to [email or web address].    Your next appointment:   6 month(s)  The format for your next appointment:   In Person  Provider:   Jacolyn Reedy, PA-C   Other Instructions     Signed, Kristeen Miss, MD  03/03/2021 1:52 PM    [facility]

## 2021-03-03 ENCOUNTER — Other Ambulatory Visit: Payer: Self-pay

## 2021-03-03 ENCOUNTER — Encounter: Payer: Self-pay | Admitting: Cardiovascular Disease

## 2021-03-03 ENCOUNTER — Ambulatory Visit (INDEPENDENT_AMBULATORY_CARE_PROVIDER_SITE_OTHER): Payer: Medicare Other | Admitting: Cardiovascular Disease

## 2021-03-03 VITALS — BP 142/88 | HR 74 | Ht 70.0 in | Wt 214.8 lb

## 2021-03-03 DIAGNOSIS — I5021 Acute systolic (congestive) heart failure: Secondary | ICD-10-CM

## 2021-03-03 DIAGNOSIS — I10 Essential (primary) hypertension: Secondary | ICD-10-CM | POA: Diagnosis not present

## 2021-03-03 DIAGNOSIS — I5042 Chronic combined systolic (congestive) and diastolic (congestive) heart failure: Secondary | ICD-10-CM

## 2021-03-03 DIAGNOSIS — Z79899 Other long term (current) drug therapy: Secondary | ICD-10-CM | POA: Diagnosis not present

## 2021-03-03 DIAGNOSIS — I480 Paroxysmal atrial fibrillation: Secondary | ICD-10-CM | POA: Diagnosis not present

## 2021-03-03 LAB — BASIC METABOLIC PANEL
BUN/Creatinine Ratio: 20 (ref 12–28)
BUN: 18 mg/dL (ref 8–27)
CO2: 23 mmol/L (ref 20–29)
Calcium: 9.7 mg/dL (ref 8.7–10.3)
Chloride: 104 mmol/L (ref 96–106)
Creatinine, Ser: 0.92 mg/dL (ref 0.57–1.00)
Glucose: 106 mg/dL — ABNORMAL HIGH (ref 70–99)
Potassium: 3.9 mmol/L (ref 3.5–5.2)
Sodium: 141 mmol/L (ref 134–144)
eGFR: 71 mL/min/{1.73_m2} (ref >59)

## 2021-03-03 LAB — CBC
Hematocrit: 44.3 % (ref 34.0–46.6)
Hemoglobin: 14.4 g/dL (ref 11.1–15.9)
MCH: 28.3 pg (ref 26.6–33.0)
MCHC: 32.5 g/dL (ref 31.5–35.7)
MCV: 87 fL (ref 79–97)
Platelets: 378 10*3/uL (ref 150–450)
RBC: 5.08 x10E6/uL (ref 3.77–5.28)
RDW: 11.3 % — ABNORMAL LOW (ref 11.7–15.4)
WBC: 6.9 10*3/uL (ref 3.4–10.8)

## 2021-03-03 LAB — TSH: TSH: 1.78 u[IU]/mL (ref 0.450–4.500)

## 2021-03-03 MED ORDER — AMIODARONE HCL 200 MG PO TABS: 100.0000 mg | ORAL_TABLET | Freq: Every day | ORAL | 3 refills | Status: DC

## 2021-03-03 NOTE — Patient Instructions (Addendum)
Medication Instructions:  Your physician has recommended you make the following change in your medication:   REDUCE the Amiodarone to 200 mg taking 1/2 tablet  daily   *If you need a refill on your cardiac medications before your next appointment, please call your pharmacy*   Lab Work: TODAY:  BMP, TSH, & CBC  If you have labs (blood work) drawn today and your tests are completely normal, you will receive your results only by: MyChart Message (if you have MyChart) OR A paper copy in the mail If you have any lab test that is abnormal or we need to change your treatment, we will call you to review the results.   Testing/Procedures: None ordered   Follow-Up: At [facility], you and your health needs are our priority.  As part of our continuing mission to provide you with exceptional heart care, we have created designated Provider Care Teams.  These Care Teams include your primary Cardiologist (physician) and Advanced Practice Providers (APPs -  Physician Assistants and Nurse Practitioners) who all work together to provide you with the care you need, when you need it.  We recommend signing up for the patient portal called "MyChart".  Sign up information is provided on this After Visit Summary.  MyChart is used to connect with patients for Virtual Visits (Telemedicine).  Patients are able to view lab/test results, encounter notes, upcoming appointments, etc.  Non-urgent messages can be sent to your provider as well.   To learn more about what you can do with MyChart, go to [email or web address].    Your next appointment:   6 month(s)  The format for your next appointment:   In Person  Provider:   Jacolyn Reedy, PA-C   Other Instructions

## 2021-06-15 ENCOUNTER — Other Ambulatory Visit: Payer: Self-pay | Admitting: Cardiovascular Disease

## 2021-06-15 DIAGNOSIS — I4819 Other persistent atrial fibrillation: Secondary | ICD-10-CM

## 2021-06-28 ENCOUNTER — Other Ambulatory Visit: Payer: Self-pay | Admitting: Cardiovascular Disease

## 2021-06-28 DIAGNOSIS — I4819 Other persistent atrial fibrillation: Secondary | ICD-10-CM

## 2021-06-29 ENCOUNTER — Other Ambulatory Visit: Payer: Self-pay | Admitting: Cardiovascular Disease

## 2021-06-29 MED ORDER — SACUBITRIL-VALSARTAN 97-103 MG PO TABS: 1.0000 | ORAL_TABLET | Freq: Two times a day (BID) | ORAL | 2 refills | Status: DC

## 2021-06-29 NOTE — Telephone Encounter (Signed)
Pt last saw Dr Elease Hashimoto 03/03/21, last labs 03/03/21 Creat 0.92, age 61, weight 97.4kg, based on specified criteria pt is on appropriate dosage of Eliquis 5mg  BID for afib.  Will refill rx.

## 2021-09-10 ENCOUNTER — Encounter: Payer: Self-pay | Admitting: Cardiovascular Disease

## 2021-09-10 NOTE — Progress Notes (Signed)
This encounter was created in error - please disregard.

## 2021-09-11 ENCOUNTER — Encounter: Payer: Medicare Other | Admitting: Cardiovascular Disease

## 2021-09-29 ENCOUNTER — Other Ambulatory Visit: Payer: Self-pay | Admitting: Cardiovascular Disease

## 2021-09-29 DIAGNOSIS — I4819 Other persistent atrial fibrillation: Secondary | ICD-10-CM

## 2021-10-02 NOTE — Telephone Encounter (Signed)
Prescription refill request for Eliquis received. Indication:Afib Last office visit:1/23 Scr:0.9 Age: 61 Weight:97.4 kg  Prescription refilled

## 2021-10-12 ENCOUNTER — Encounter: Payer: Self-pay | Admitting: Cardiovascular Disease

## 2021-10-12 ENCOUNTER — Ambulatory Visit: Payer: Medicare Other | Attending: Cardiovascular Disease | Admitting: Cardiovascular Disease

## 2021-10-12 VITALS — BP 136/88 | HR 59 | Ht 72.0 in | Wt 196.4 lb

## 2021-10-12 DIAGNOSIS — I4891 Unspecified atrial fibrillation: Secondary | ICD-10-CM | POA: Diagnosis not present

## 2021-10-12 DIAGNOSIS — I5042 Chronic combined systolic (congestive) and diastolic (congestive) heart failure: Secondary | ICD-10-CM | POA: Diagnosis not present

## 2021-10-12 DIAGNOSIS — I10 Essential (primary) hypertension: Secondary | ICD-10-CM | POA: Diagnosis not present

## 2021-10-12 LAB — BASIC METABOLIC PANEL
BUN/Creatinine Ratio: 17 (ref 12–28)
BUN: 11 mg/dL (ref 8–27)
CO2: 25 mmol/L (ref 20–29)
Calcium: 10.2 mg/dL (ref 8.7–10.3)
Chloride: 105 mmol/L (ref 96–106)
Creatinine, Ser: 0.66 mg/dL (ref 0.57–1.00)
Glucose: 109 mg/dL — ABNORMAL HIGH (ref 70–99)
Potassium: 3.8 mmol/L (ref 3.5–5.2)
Sodium: 144 mmol/L (ref 134–144)
eGFR: 99 mL/min/{1.73_m2} (ref >59)

## 2021-10-12 LAB — TSH: TSH: 0.006 u[IU]/mL — ABNORMAL LOW (ref 0.450–4.500)

## 2021-10-12 MED ORDER — AMIODARONE HCL 100 MG PO TABS: 100.0000 mg | ORAL_TABLET | Freq: Every day | ORAL | 3 refills | Status: DC

## 2021-10-12 NOTE — Progress Notes (Signed)
Cardiology Office Note:    Date:  10/12/2021   ID:  Joanna [name], DOB [birth year], MRN [id number]  PCP:  Marcine Matar, MD  Cardiologist:  Kristeen Miss, MD  Electrophysiologist:  None   Referring MD: Marcine Matar, MD   Chief Complaint  Patient presents with   Atrial Fibrillation   Congestive Heart Failure         Problem list 1.  Chronic combined congestive heart failure 2.  Atrial fibrillation     History of Present Illness:    Joanna [name] is a 61 y.o. female with a hx of  Chronic combined CHF .    Seen with her sister.   EF is 25%.   Grade 2 diastolic dysfunction  She was hospitalized in Feb. 2020 with respiratory failure and AFib   TEE showed mod - severe MR .   Was in atrial fib at the time - was successfully cardioverted at the time   Was seen by Chelsea Aus, PA in Feb.  Was back in AFib at the time.  Started on amiodarone.  I had a telemedicine visit with her in June, 2020 Was feeling better on amiodarone  Feels fine.    She does not want to have another cardioversion but is willing to consider it.   Has an appt with EP clinic on Monday  Breathing is good.  Still eating lots of salty foods.   HR is very regular today .     September 07, 2019:  Joanna [name] is a 61 year old female with a history of chronic combined congestive heart failure, her ejection fractions around 25%.  She has grade 2 diastolic dysfunction.  She has persistent hypertension.  She eats a very high salt diet. Eats meats and potatoes,  Does not eat vegetables for the most part. Will eat green beans on occasion .   Has started back smoking .   Jan. 27, 2023 Joanna [name] is seen for follow up of her chronic combined CHF, HTN, ongoing cigarette smoking. Had previous LV EF  of 25% Echo in Aug. 30, 2022 - normal LV function , grade II DD    Hx of atrial fib , is maintaining NSR  Will reduce amio to 100 mg a day  Has reduced her salt intake  Wt is 214.8 lbs.    Has reduced  her cigarette smoking .   October 12, 2021: Joanna [name] seen today for follow-up of her chronic combined congestive heart failure, hypertension, paroxysmal atrial fibrillation. Wt is 196.  ( Down 18 lbs)  [name] several weeks ago ( running from a dog)  Still smoking     Past Medical History:  Diagnosis Date   Atrial fibrillation with RVR (HCC)    Chronic combined systolic (congestive) and diastolic (congestive) heart failure (HCC)    Dyspnea    Goiter    Hypertension    NICM (nonischemic cardiomyopathy) (HCC)    a. 08/2005 Echo: EF 30-35%, mod diff HK. Mild to Mod MR. Mildly dil LA; b. 08/2005 Cath: Nl Cors. Elevated CO w/o shunt; c. 09/2007 Echo: EF 45%. Mild to mod MR; c. 03/2015 Echo: EF 45%, global HK. Gr1 DD. Mild MR. Mildly dil LA.    Past Surgical History:  Procedure Laterality Date   APPENDECTOMY     BACK SURGERY     CARDIOVERSION N/A 03/14/2018   Procedure: CARDIOVERSION;  Surgeon: Pricilla Riffle, MD;  Location: [facility] ENDOSCOPY;  Service: Cardiovascular;  Laterality: N/A;   TEE WITHOUT CARDIOVERSION N/A  03/14/2018   Procedure: TRANSESOPHAGEAL ECHOCARDIOGRAM (TEE);  Surgeon: Pricilla Riffle, MD;  Location: [facility] ENDOSCOPY;  Service: Cardiovascular;  Laterality: N/A;    Current Medications: Current Meds  Medication Sig   amiodarone (PACERONE) 100 MG tablet Take 1 tablet (100 mg total) by mouth daily.   ELIQUIS 5 MG TABS tablet TAKE 1 TABLET BY MOUTH TWICE  DAILY   furosemide (LASIX) 20 MG tablet TAKE 1 TABLET BY MOUTH  DAILY   metoprolol tartrate (LOPRESSOR) 50 MG tablet TAKE 1 TABLET BY MOUTH TWICE  DAILY   sacubitril-valsartan (ENTRESTO) 97-103 MG Take 1 tablet by mouth 2 (two) times daily.   [DISCONTINUED] amiodarone (PACERONE) 200 MG tablet TAKE 1 TABLET BY MOUTH DAILY     Allergies:   Other   Social History   Socioeconomic History   Marital status: Single    Spouse name: Not on file   Number of children: Not on file   Years of education: Not on file   Highest education level:  Not on file  Occupational History   Occupation: unemployed  Tobacco Use   Smoking status: Former    Packs/day: 2.00    Years: 30.00    Total pack years: 60.00    Types: Cigarettes    Quit date: 02/08/2018    Years since quitting: 3.6   Smokeless tobacco: Never  Vaping Use   Vaping Use: Never used  Substance and Sexual Activity   Alcohol use: No    Comment: quit drinking year ago   Drug use: No   Sexual activity: Not on file  Other Topics Concern   Not on file  Social History Narrative   Not on file   Social Determinants of Health   Financial Resource Strain: Not on file  Food Insecurity: Not on file  Transportation Needs: Not on file  Physical Activity: Not on file  Stress: Not on file  Social Connections: Not on file     Family History: The patient's family history includes Cancer in her mother; Heart disease in her father.  ROS:   Please see the history of present illness.     All other systems reviewed and are negative.  EKGs/Labs/Other Studies Reviewed:    The following studies were reviewed today:    Recent Labs: 03/03/2021: BUN 18; Creatinine, Ser 0.92; Hemoglobin 14.4; Platelets 378; Potassium 3.9; Sodium 141; TSH 1.780  Recent Lipid Panel    Component Value Date/Time   CHOL 176 08/03/2020 0814   TRIG 145 08/03/2020 0814   HDL 41 08/03/2020 0814   CHOLHDL 4.3 08/03/2020 0814   CHOLHDL 3.6 03/09/2018 0407   VLDL 13 03/09/2018 0407   LDLCALC 109 (H) 08/03/2020 0814    Physical Exam:    Physical Exam: Blood pressure 136/88, pulse (!) 59, height 6' (1.829 m), weight 196 lb 6.4 oz (89.1 kg), SpO2 99 %.       GEN:  Well nourished, well developed in no acute distress HEENT: Normal NECK: No JVD; No carotid bruits LYMPHATICS: No lymphadenopathy CARDIAC: RRR , soft systolic murmur  RESPIRATORY:  Clear to auscultation without rales, wheezing or rhonchi  ABDOMEN: Soft, non-tender, non-distended MUSCULOSKELETAL:  No edema; No deformity  SKIN: Warm  and dry NEUROLOGIC:  Alert and oriented x 3    EKG: October 12, 2021: Sinus bradycardia at 59.  Minimal voltage criteria for left ventricular hypertrophy.  Nonspecific ST and T wave changes.  ASSESSMENT:    1. Atrial fibrillation with RVR (HCC)   2. Chronic  combined systolic and diastolic CHF (congestive heart failure) (HCC)   3. Essential hypertension      PLAN:      Atrial fibrillation:  paroxymsal ,    Cont amiodarone 100 mg a day  Check TSH today       2.  Chronic systolic congestive heart failure:    On lasix Entresto  3. Cigarette smoking:   advised cessation .         Medication Adjustments/Labs and Tests Ordered: Current medicines are reviewed at length with the patient today.  Concerns regarding medicines are outlined above.  Orders Placed This Encounter  Procedures   Basic metabolic panel   TSH   EKG 12-Lead   Meds ordered this encounter  Medications   amiodarone (PACERONE) 100 MG tablet    Sig: Take 1 tablet (100 mg total) by mouth daily.    Dispense:  90 tablet    Refill:  3    Patient Instructions  Medication Instructions:  Continue Amiodarone 100mg  daily *If you need a refill on your cardiac medications before your next appointment, please call your pharmacy*   Lab Work: BMET, TSH today If you have labs (blood work) drawn today and your tests are completely normal, you will receive your results only by: MyChart Message (if you have MyChart) OR A paper copy in the mail If you have any lab test that is abnormal or we need to change your treatment, we will call you to review the results.   Testing/Procedures: NONE   Follow-Up: At [facility], you and your health needs are our priority.  As part of our continuing mission to provide you with exceptional heart care, we have created designated Provider Care Teams.  These Care Teams include your primary Cardiologist (physician) and Advanced Practice Providers (APPs -  Physician  Assistants and Nurse Practitioners) who all work together to provide you with the care you need, when you need it.  We recommend signing up for the patient portal called "MyChart".  Sign up information is provided on this After Visit Summary.  MyChart is used to connect with patients for Virtual Visits (Telemedicine).  Patients are able to view lab/test results, encounter notes, upcoming appointments, etc.  Non-urgent messages can be sent to your provider as well.   To learn more about what you can do with MyChart, go to [facility].    Your next appointment:   6 month(s)  The format for your next appointment:   In Person  Provider:   [email or web address], NP      Important Information About Sugar         Signed, Eligha Bridegroom, MD  10/12/2021 9:48 AM    [facility]

## 2021-10-12 NOTE — Patient Instructions (Signed)
Medication Instructions:  Continue Amiodarone 100mg  daily *If you need a refill on your cardiac medications before your next appointment, please call your pharmacy*   Lab Work: BMET, TSH today If you have labs (blood work) drawn today and your tests are completely normal, you will receive your results only by: MyChart Message (if you have MyChart) OR A paper copy in the mail If you have any lab test that is abnormal or we need to change your treatment, we will call you to review the results.   Testing/Procedures: NONE   Follow-Up: At [facility], you and your health needs are our priority.  As part of our continuing mission to provide you with exceptional heart care, we have created designated Provider Care Teams.  These Care Teams include your primary Cardiologist (physician) and Advanced Practice Providers (APPs -  Physician Assistants and Nurse Practitioners) who all work together to provide you with the care you need, when you need it.  We recommend signing up for the patient portal called "MyChart".  Sign up information is provided on this After Visit Summary.  MyChart is used to connect with patients for Virtual Visits (Telemedicine).  Patients are able to view lab/test results, encounter notes, upcoming appointments, etc.  Non-urgent messages can be sent to your provider as well.   To learn more about what you can do with MyChart, go to [facility].    Your next appointment:   6 month(s)  The format for your next appointment:   In Person  Provider:   [email or web address], NP      Important Information About Sugar

## 2021-10-16 ENCOUNTER — Telehealth: Payer: Self-pay

## 2021-10-16 MED ORDER — METHIMAZOLE 5 MG PO TABS: 5.0000 mg | ORAL_TABLET | Freq: Every day | ORAL | 0 refills | Status: DC

## 2021-10-16 NOTE — Telephone Encounter (Signed)
Called and spoke with patient. She verbally read-back that she will stop the Amiodarone. MAR updated at this time. Methimazole sent to [facility] pharmacy on file per request, as she states mail order can sometimes take up to 2 weeks to get. Says she hasn't seen Dr Laural Benes in a while, but also hasn't found new PCP. Understands that we have sent these results to Johnson's office and she needs to f/u with them for med dosing and titration, then can switch to new PCP when she finds one.

## 2021-10-16 NOTE — Telephone Encounter (Signed)
-----   Message from [name], MD sent at 10/14/2021  8:42 AM EDT ----- Glucose is mildly elevated TSH is very low - indicating hyperthyroidism- likely caused by Amiodarone  Lets DC Amiodarone Start Methimazole 5 mg a day  I would like for her to see Dr. Laural Benes for further evaluation/ management  of her hyperthyroidism

## 2021-12-20 ENCOUNTER — Other Ambulatory Visit: Payer: Self-pay

## 2021-12-20 ENCOUNTER — Emergency Department (HOSPITAL_COMMUNITY): Payer: Medicare Other

## 2021-12-20 ENCOUNTER — Encounter (HOSPITAL_COMMUNITY): Payer: Self-pay | Admitting: Pharmacy Technician

## 2021-12-20 ENCOUNTER — Inpatient Hospital Stay (HOSPITAL_COMMUNITY)
Admission: EM | Admit: 2021-12-20 | Discharge: 2021-12-25 | DRG: 291 | Disposition: A | Discharge status: Home / Self Care | Payer: Medicare Other | Attending: Internal Medicine | Admitting: Internal Medicine

## 2021-12-20 DIAGNOSIS — Z7901 Long term (current) use of anticoagulants: Secondary | ICD-10-CM | POA: Diagnosis not present

## 2021-12-20 DIAGNOSIS — I5043 Acute on chronic combined systolic (congestive) and diastolic (congestive) heart failure: Secondary | ICD-10-CM | POA: Diagnosis not present

## 2021-12-20 DIAGNOSIS — E064 Drug-induced thyroiditis: Secondary | ICD-10-CM | POA: Diagnosis not present

## 2021-12-20 DIAGNOSIS — E876 Hypokalemia: Secondary | ICD-10-CM | POA: Diagnosis not present

## 2021-12-20 DIAGNOSIS — E039 Hypothyroidism, unspecified: Secondary | ICD-10-CM | POA: Diagnosis present

## 2021-12-20 DIAGNOSIS — I110 Hypertensive heart disease with heart failure: Secondary | ICD-10-CM | POA: Diagnosis not present

## 2021-12-20 DIAGNOSIS — I5031 Acute diastolic (congestive) heart failure: Secondary | ICD-10-CM | POA: Diagnosis not present

## 2021-12-20 DIAGNOSIS — Z79899 Other long term (current) drug therapy: Secondary | ICD-10-CM | POA: Diagnosis not present

## 2021-12-20 DIAGNOSIS — R739 Hyperglycemia, unspecified: Secondary | ICD-10-CM | POA: Diagnosis not present

## 2021-12-20 DIAGNOSIS — J449 Chronic obstructive pulmonary disease, unspecified: Secondary | ICD-10-CM | POA: Diagnosis present

## 2021-12-20 DIAGNOSIS — R0602 Shortness of breath: Secondary | ICD-10-CM | POA: Diagnosis not present

## 2021-12-20 DIAGNOSIS — T462X5A Adverse effect of other antidysrhythmic drugs, initial encounter: Secondary | ICD-10-CM | POA: Diagnosis not present

## 2021-12-20 DIAGNOSIS — R000 Tachycardia, unspecified: Secondary | ICD-10-CM | POA: Diagnosis not present

## 2021-12-20 DIAGNOSIS — I10 Essential (primary) hypertension: Secondary | ICD-10-CM | POA: Diagnosis not present

## 2021-12-20 DIAGNOSIS — T68XXXA Hypothermia, initial encounter: Secondary | ICD-10-CM | POA: Diagnosis not present

## 2021-12-20 DIAGNOSIS — J90 Pleural effusion, not elsewhere classified: Secondary | ICD-10-CM | POA: Diagnosis not present

## 2021-12-20 DIAGNOSIS — I4891 Unspecified atrial fibrillation: Secondary | ICD-10-CM | POA: Diagnosis not present

## 2021-12-20 DIAGNOSIS — Z1152 Encounter for screening for COVID-19: Secondary | ICD-10-CM

## 2021-12-20 DIAGNOSIS — I5033 Acute on chronic diastolic (congestive) heart failure: Secondary | ICD-10-CM | POA: Diagnosis not present

## 2021-12-20 DIAGNOSIS — Z8249 Family history of ischemic heart disease and other diseases of the circulatory system: Secondary | ICD-10-CM

## 2021-12-20 DIAGNOSIS — Z720 Tobacco use: Secondary | ICD-10-CM

## 2021-12-20 DIAGNOSIS — R0789 Other chest pain: Secondary | ICD-10-CM | POA: Diagnosis not present

## 2021-12-20 DIAGNOSIS — R079 Chest pain, unspecified: Secondary | ICD-10-CM | POA: Diagnosis not present

## 2021-12-20 DIAGNOSIS — I4819 Other persistent atrial fibrillation: Secondary | ICD-10-CM | POA: Diagnosis not present

## 2021-12-20 DIAGNOSIS — I340 Nonrheumatic mitral (valve) insufficiency: Secondary | ICD-10-CM | POA: Diagnosis present

## 2021-12-20 DIAGNOSIS — E0590 Thyrotoxicosis, unspecified without thyrotoxic crisis or storm: Secondary | ICD-10-CM | POA: Diagnosis present

## 2021-12-20 LAB — TSH: TSH: <0.01 u[IU]/mL — ABNORMAL LOW (ref 0.350–4.500)

## 2021-12-20 LAB — BRAIN NATRIURETIC PEPTIDE: B Natriuretic Peptide: 1481.1 pg/mL — ABNORMAL HIGH (ref 0.0–100.0)

## 2021-12-20 LAB — RESP PANEL BY RT-PCR (FLU A&B, COVID) ARPGX2
Influenza A by PCR: NEGATIVE
Influenza B by PCR: NEGATIVE
SARS Coronavirus 2 by RT PCR: NEGATIVE

## 2021-12-20 LAB — CBC WITH DIFFERENTIAL/PLATELET
Abs Immature Granulocytes: 0.02 10*3/uL (ref 0.00–0.07)
Basophils Absolute: 0 10*3/uL (ref 0.0–0.1)
Basophils Relative: 1 %
Eosinophils Absolute: 0.1 10*3/uL (ref 0.0–0.5)
Eosinophils Relative: 1 %
HCT: 41.2 % (ref 36.0–46.0)
Hemoglobin: 13.1 g/dL (ref 12.0–15.0)
Immature Granulocytes: 0 %
Lymphocytes Relative: 23 %
Lymphs Abs: 1.5 10*3/uL (ref 0.7–4.0)
MCH: 27.4 pg (ref 26.0–34.0)
MCHC: 31.8 g/dL (ref 30.0–36.0)
MCV: 86.2 fL (ref 80.0–100.0)
Monocytes Absolute: 0.4 10*3/uL (ref 0.1–1.0)
Monocytes Relative: 6 %
Neutro Abs: 4.4 10*3/uL (ref 1.7–7.7)
Neutrophils Relative %: 69 %
Platelets: 315 10*3/uL (ref 150–400)
RBC: 4.78 MIL/uL (ref 3.87–5.11)
RDW: 13.9 % (ref 11.5–15.5)
WBC: 6.3 10*3/uL (ref 4.0–10.5)
nRBC: 0 % (ref 0.0–0.2)

## 2021-12-20 LAB — BASIC METABOLIC PANEL
Anion gap: 11 (ref 5–15)
BUN: 13 mg/dL (ref 8–23)
CO2: 22 mmol/L (ref 22–32)
Calcium: 9.1 mg/dL (ref 8.9–10.3)
Chloride: 111 mmol/L (ref 98–111)
Creatinine, Ser: 0.84 mg/dL (ref 0.44–1.00)
GFR, Estimated: >60 mL/min (ref >60)
Glucose, Bld: 118 mg/dL — ABNORMAL HIGH (ref 70–99)
Potassium: 3.1 mmol/L — ABNORMAL LOW (ref 3.5–5.1)
Sodium: 144 mmol/L (ref 135–145)

## 2021-12-20 LAB — TROPONIN I (HIGH SENSITIVITY)
Troponin I (High Sensitivity): 32 ng/L — ABNORMAL HIGH (ref <18)
Troponin I (High Sensitivity): 28 ng/L — ABNORMAL HIGH (ref <18)

## 2021-12-20 MED ORDER — POTASSIUM CHLORIDE CRYS ER 20 MEQ PO TBCR
40.0000 meq | EXTENDED_RELEASE_TABLET | Freq: Once | ORAL | Status: AC
  Administered 2021-12-20: 40 meq via ORAL
  Filled 2021-12-20: qty 2

## 2021-12-20 MED ORDER — APIXABAN 5 MG PO TABS
5.0000 mg | ORAL_TABLET | Freq: Two times a day (BID) | ORAL | Status: DC
  Administered 2021-12-20 – 2021-12-25 (×10): 5 mg via ORAL
  Filled 2021-12-20 (×10): qty 1

## 2021-12-20 MED ORDER — METOPROLOL TARTRATE 5 MG/5ML IV SOLN
10.0000 mg | Freq: Once | INTRAVENOUS | Status: AC
  Administered 2021-12-20: 10 mg via INTRAVENOUS
  Filled 2021-12-20: qty 10

## 2021-12-20 MED ORDER — SACUBITRIL-VALSARTAN 97-103 MG PO TABS
1.0000 | ORAL_TABLET | Freq: Two times a day (BID) | ORAL | Status: DC
  Administered 2021-12-20 – 2021-12-22 (×5): 1 via ORAL
  Filled 2021-12-20 (×6): qty 1

## 2021-12-20 MED ORDER — METOPROLOL TARTRATE 25 MG PO TABS
25.0000 mg | ORAL_TABLET | Freq: Two times a day (BID) | ORAL | Status: DC
  Administered 2021-12-20 – 2021-12-21 (×2): 25 mg via ORAL
  Filled 2021-12-20 (×2): qty 1

## 2021-12-20 MED ORDER — FUROSEMIDE 10 MG/ML IJ SOLN
40.0000 mg | Freq: Once | INTRAMUSCULAR | Status: AC
  Administered 2021-12-20: 40 mg via INTRAVENOUS
  Filled 2021-12-20: qty 4

## 2021-12-20 MED ORDER — METHIMAZOLE 5 MG PO TABS
5.0000 mg | ORAL_TABLET | Freq: Every day | ORAL | Status: DC
  Administered 2021-12-21 – 2021-12-22 (×2): 5 mg via ORAL
  Filled 2021-12-20 (×2): qty 1

## 2021-12-20 MED ORDER — FUROSEMIDE 10 MG/ML IJ SOLN
40.0000 mg | Freq: Two times a day (BID) | INTRAMUSCULAR | Status: DC
  Administered 2021-12-20 – 2021-12-21 (×3): 40 mg via INTRAVENOUS
  Filled 2021-12-20 (×4): qty 4

## 2021-12-20 MED ORDER — POTASSIUM CHLORIDE CRYS ER 20 MEQ PO TBCR
40.0000 meq | EXTENDED_RELEASE_TABLET | Freq: Two times a day (BID) | ORAL | Status: AC
  Administered 2021-12-20 – 2021-12-22 (×4): 40 meq via ORAL
  Filled 2021-12-20 (×4): qty 2

## 2021-12-20 NOTE — ED Notes (Signed)
Patient transported to X-ray 

## 2021-12-20 NOTE — ED Provider Notes (Signed)
[facility] EMERGENCY DEPARTMENT Provider Note   CSN: [id number] Arrival date & time: 12/20/21  1158     History { No chief complaint on file.   Joanna [name] is a 61 y.o. female with a history of combined systolic and diastolic HF, paroxysmal atrial fibrillation on AC, HTN presenting to the [facility] with worsening shortness of breath   Patient describes she was in her usual state of health until ~5 days ago. She noticed her heart racing and feeling short of breath with exertion. No URI symptoms, new cough, fever, chest pain, nausea, vomiting, changes in vision, or HA associated with it. However, the shortness of breath has progressively gotten worse, now needing several pillows to sleep at night, having more difficulty catching her breath with minimal movement, and increased palpitations. Patient has been coughing more at night when laying down and increased shortness of breath.   She denies feeling swelling in her lower extremities but that she has been gaining weight for the past 3 weeks. She was ~190lb 3 weeks ago. She has been compliant with her Lasix, but reports was not instructed to take more medication if her weight went up.  She also reports that she saw her Cardiologist in September. Prior to this, she was taking Amiodarone for pAfib, which was stopped after this visit for concerns that it was affecting her thyroid. She was started on Methimazole 5mg  daily and she has been compliant.   Currently patient is endorsing palpitations and shortness of breath as described above. Patient denies nausea, vomiting, diarrhea, abdominal pain, chest pain altered mental status, increased anxiety.   The history is provided by the patient and a relative. No language interpreter was used.       Home Medications Prior to Admission medications   Medication Sig Start Date End Date Taking? Authorizing Provider  ELIQUIS 5 MG TABS tablet TAKE 1 TABLET BY MOUTH TWICE  DAILY Patient  taking differently: Take 5 mg by mouth 2 (two) times daily. 10/02/21  Yes Nahser, Wonda Cheng, MD  furosemide (LASIX) 20 MG tablet TAKE 1 TABLET BY MOUTH  DAILY Patient taking differently: Take 20 mg by mouth daily. 06/16/21  Yes Nahser, Wonda Cheng, MD  methimazole (TAPAZOLE) 5 MG tablet Take 1 tablet (5 mg total) by mouth daily. 10/16/21  Yes Nahser, Wonda Cheng, MD  metoprolol tartrate (LOPRESSOR) 50 MG tablet TAKE 1 TABLET BY MOUTH TWICE  DAILY Patient taking differently: Take 50 mg by mouth 2 (two) times daily. 06/29/21  Yes Nahser, Wonda Cheng, MD  sacubitril-valsartan (ENTRESTO) 97-103 MG Take 1 tablet by mouth 2 (two) times daily. 06/29/21  Yes Nahser, Wonda Cheng, MD      Allergies    Other    Review of Systems   Review of Systems  Constitutional:  Positive for activity change and fatigue. Negative for appetite change, chills, diaphoresis and fever.  HENT: Negative.    Eyes: Negative.   Respiratory:  Positive for cough and shortness of breath. Negative for apnea, choking, chest tightness, wheezing and stridor.   Cardiovascular:  Positive for palpitations. Negative for chest pain and leg swelling.  Gastrointestinal: Negative.  Negative for diarrhea and nausea.  Endocrine: Negative for cold intolerance, heat intolerance, polydipsia, polyphagia and polyuria.  Genitourinary:  Negative for difficulty urinating, dysuria and flank pain.  Musculoskeletal: Negative.   Skin: Negative.   Allergic/Immunologic: Negative.   Neurological: Negative.   Hematological: Negative.   Psychiatric/Behavioral: Negative.      Physical Exam Updated Vital  Signs BP 133/85   Pulse 98   Temp 99.3 F (37.4 C) (Oral)   Resp (!) 24   SpO2 97%  Physical Exam Constitutional:      General: She is in acute distress.  Cardiovascular:     Rate and Rhythm: Rhythm irregular.     Comments: JVP to the mandible  Pulmonary:     Breath sounds: Rales present.     Comments: Patient is speaking in full sentences with tachypnea.  She is currently saturating 100% on RA. Bilateral rales on exam Abdominal:     General: There is distension.     Palpations: Abdomen is soft. There is no mass.     Tenderness: There is no abdominal tenderness. There is no guarding.  Skin:    General: Skin is warm and dry.  Neurological:     General: No focal deficit present.     Mental Status: She is alert and oriented to person, place, and time.     ED Results / Procedures / Treatments   Labs (all labs ordered are listed, but only abnormal results are displayed) Labs Reviewed  BASIC METABOLIC PANEL - Abnormal; Notable for the following components:      Result Value   Potassium 3.1 (*)    Glucose, Bld 118 (*)    All other components within normal limits  BRAIN NATRIURETIC PEPTIDE - Abnormal; Notable for the following components:   B Natriuretic Peptide 1,481.1 (*)    All other components within normal limits  RESP PANEL BY RT-PCR (FLU A&B, COVID) ARPGX2  CBC WITH DIFFERENTIAL/PLATELET  MAGNESIUM  T4, FREE  TSH  HEPATIC FUNCTION PANEL  T4, FREE  HEPATIC FUNCTION PANEL  MAGNESIUM  TROPONIN I (HIGH SENSITIVITY)  TROPONIN I (HIGH SENSITIVITY)    EKG EKG Interpretation  Date/Time:  Wednesday December 20 2021 12:27:50 EST Ventricular Rate:  126 PR Interval:    QRS Duration: 102 QT Interval:  350 QTC Calculation: 506 R Axis:   20 Text Interpretation: Atrial fibrillation with rapid ventricular response Cannot rule out Anterior infarct , age undetermined ST-t wave abnormality Abnormal ECG Confirmed by Carmin Muskrat [phone]) on 12/20/2021 5:50:43 PM  Radiology DG Chest 2 View  Result Date: 12/20/2021 CLINICAL DATA:  Chest pain, shortness of breath EXAM: CHEST - 2 VIEW COMPARISON:  03/08/2018 FINDINGS: Transverse diameter of heart is increased. Central pulmonary vessels are more prominent. There is subtle increase in interstitial markings in parahilar regions and lower lung fields. Small bilateral pleural effusions are  seen, more so on the right side. There is no pneumothorax. IMPRESSION: Cardiomegaly. Central pulmonary vessels are more prominent suggesting CHF. Increased interstitial markings in both lungs suggest pulmonary edema. Small bilateral pleural effusions. Electronically Signed   By: Elmer Picker M.D.   On: 12/20/2021 13:41    Procedures Telemetry: atrial fibrillation with RVR  Medications Ordered in ED Medications  sacubitril-valsartan (ENTRESTO) 97-103 mg per tablet (has no administration in time range)  furosemide (LASIX) injection 40 mg (40 mg Intravenous Given 12/20/21 1937)  metoprolol tartrate (LOPRESSOR) injection 10 mg (10 mg Intravenous Given 12/20/21 1938)  potassium chloride SA (KLOR-CON M) CR tablet 40 mEq (40 mEq Oral Given 12/20/21 1937)    ED Course/ Medical Decision Making/ A&P                           Medical Decision Making Amount and/or Complexity of Data Reviewed Labs: ordered.  Risk Prescription drug management. Decision regarding  hospitalization.  Patient with known history of tobacco use disorder, COPD, HF, paroxysmal atrial fibrillation on AC, previously on amiodarone, now on metoprolol, and recent hyperthyroidism diagnosis presenting with progressive shortness of breath. Ddx: uncontrolled atrial fibrillation, heart failure exacerbation with pulmonary edema, worsening hyperthyroidism vs thyroid storm, COPD exacerbation, ACS.   Patient is volume overloaded on exam, with afib with RVR on telemetry and EKG. Initial tests significant for BMP K 3.1, BNP 1481. CBC and renal function tests wnl. CXR significant for increased interstitial markings suggesting pulmonary edema.   Do not suspect infectious causes driving current status. However, I am concerned about patient amiodarone induced thyroid abnormalities.  Upon chart review, patient recent 10/2021 TSH was 0.006. Given constellation of criteria such as temperature at 99.3, sustained atrial fibrillation with RVR,  signs of CHF, this could also point at impending thyroid storm. Patient took methimazole 5mg  and metoprolol 50 mg this AM.   Patient given Lopressor 10 mg and Lasix 40 mg IV. She is currently being monitored on telemetry. On re-evaluation, patient HR came down to 90, though she continues to be in Atrial fibrillation. Patient has voided 3 times since Lasix was given. RR has slowed down though still endorsing respiratory distress with exertion.  Pending labs include: TSH, T4, troponin, magnesium, LFTs. Patient continues to deny chest pain during this visit.   10:56PM spoke with laboratory to follow up on troponin, which were still pending. Patient is currently  stable. Dr. will follow up troponin results and assess for need to admit patient to medicine service.    Final Clinical Impression(s) / ED Diagnoses Final diagnoses:  Acute on chronic combined systolic and diastolic congestive heart failure [facility])    Rx / DC Orders ED Discharge Orders     None         [facility], MD 12/20/21 12/22/21    [id number], MD 12/20/21 2353

## 2021-12-20 NOTE — ED Triage Notes (Signed)
Pt bib ems with increased shob and exertional cp with pedal edema X3 days. Endorses 1 episode NV with the exertion. Poor historian. Pt showing afib 80-160's. 160/120. Given 324mg  asa and 1 NTG. 150/108 after NTG. 22g L hand.

## 2021-12-20 NOTE — ED Provider Triage Note (Signed)
Emergency Medicine Provider Triage Evaluation Note  Joanna [name] , a 61 y.o. female  was evaluated in triage.  Pt complains of chest pain or shortness of breath.  She has had a cough for the past several days.  Over the past 3 days she states that her she has increased pain which is exertional and it makes her feel more short of breath.  She has a history of nonischemic cardiomyopathy and A-fib.  Review of Systems  Positive: Cough Negative: Fever  Physical Exam  BP (!) 162/98 (BP Location: Right Arm)   Pulse (!) 115   Temp 98.1 F (36.7 C) (Oral)   Resp 19   SpO2 99%  Gen:   Awake, no distress   Resp:  Normal effort  MSK:   Moves extremities without difficulty  Other:  No wheezing  Medical Decision Making  Medically screening exam initiated at 1:07 PM.  Appropriate orders placed.  Joanna [name] was informed that the remainder of the evaluation will be completed by another provider, this initial triage assessment does not replace that evaluation, and the importance of remaining in the ED until their evaluation is complete.     Arthor Captain, PA-C 12/20/21 1309

## 2021-12-21 ENCOUNTER — Inpatient Hospital Stay (HOSPITAL_COMMUNITY): Payer: Medicare Other

## 2021-12-21 DIAGNOSIS — J449 Chronic obstructive pulmonary disease, unspecified: Secondary | ICD-10-CM

## 2021-12-21 DIAGNOSIS — E0590 Thyrotoxicosis, unspecified without thyrotoxic crisis or storm: Secondary | ICD-10-CM

## 2021-12-21 DIAGNOSIS — I4891 Unspecified atrial fibrillation: Secondary | ICD-10-CM

## 2021-12-21 DIAGNOSIS — I5031 Acute diastolic (congestive) heart failure: Secondary | ICD-10-CM | POA: Diagnosis not present

## 2021-12-21 DIAGNOSIS — I5043 Acute on chronic combined systolic (congestive) and diastolic (congestive) heart failure: Secondary | ICD-10-CM

## 2021-12-21 DIAGNOSIS — I5033 Acute on chronic diastolic (congestive) heart failure: Secondary | ICD-10-CM | POA: Diagnosis not present

## 2021-12-21 DIAGNOSIS — I10 Essential (primary) hypertension: Secondary | ICD-10-CM

## 2021-12-21 LAB — ECHOCARDIOGRAM COMPLETE
AR max vel: 3.48 cm2
AV Area VTI: 3.27 cm2
AV Area mean vel: 3.31 cm2
AV Mean grad: 2 mmHg
AV Peak grad: 4 mmHg
Ao pk vel: 1 m/s
Calc EF: 26.4 %
MV M vel: 4.95 m/s
MV Peak grad: 98 mmHg
S' Lateral: 5.1 cm
Single Plane A2C EF: 30.2 %
Single Plane A4C EF: 23 %

## 2021-12-21 LAB — MAGNESIUM
Magnesium: 1.6 mg/dL — ABNORMAL LOW (ref 1.7–2.4)
Magnesium: 1.5 mg/dL — ABNORMAL LOW (ref 1.7–2.4)

## 2021-12-21 LAB — CBC WITH DIFFERENTIAL/PLATELET
Abs Immature Granulocytes: 0.02 K/uL (ref 0.00–0.07)
Basophils Absolute: 0 K/uL (ref 0.0–0.1)
Basophils Relative: 1 %
Eosinophils Absolute: 0.1 K/uL (ref 0.0–0.5)
Eosinophils Relative: 1 %
HCT: 41 % (ref 36.0–46.0)
Hemoglobin: 13 g/dL (ref 12.0–15.0)
Immature Granulocytes: 0 %
Lymphocytes Relative: 35 %
Lymphs Abs: 2 K/uL (ref 0.7–4.0)
MCH: 26.7 pg (ref 26.0–34.0)
MCHC: 31.7 g/dL (ref 30.0–36.0)
MCV: 84.2 fL (ref 80.0–100.0)
Monocytes Absolute: 0.5 K/uL (ref 0.1–1.0)
Monocytes Relative: 9 %
Neutro Abs: 3.1 K/uL (ref 1.7–7.7)
Neutrophils Relative %: 54 %
Platelets: 293 K/uL (ref 150–400)
RBC: 4.87 MIL/uL (ref 3.87–5.11)
RDW: 14 % (ref 11.5–15.5)
WBC: 5.7 K/uL (ref 4.0–10.5)
nRBC: 0 % (ref 0.0–0.2)

## 2021-12-21 LAB — COMPREHENSIVE METABOLIC PANEL WITH GFR
ALT: 46 U/L — ABNORMAL HIGH (ref 0–44)
AST: 34 U/L (ref 15–41)
Albumin: 3.4 g/dL — ABNORMAL LOW (ref 3.5–5.0)
Alkaline Phosphatase: 79 U/L (ref 38–126)
Anion gap: 9 (ref 5–15)
BUN: 13 mg/dL (ref 8–23)
CO2: 24 mmol/L (ref 22–32)
Calcium: 9.2 mg/dL (ref 8.9–10.3)
Chloride: 109 mmol/L (ref 98–111)
Creatinine, Ser: 0.77 mg/dL (ref 0.44–1.00)
GFR, Estimated: >60 mL/min (ref >60)
Glucose, Bld: 133 mg/dL — ABNORMAL HIGH (ref 70–99)
Potassium: 3 mmol/L — ABNORMAL LOW (ref 3.5–5.1)
Sodium: 142 mmol/L (ref 135–145)
Total Bilirubin: 0.7 mg/dL (ref 0.3–1.2)
Total Protein: 6.7 g/dL (ref 6.5–8.1)

## 2021-12-21 LAB — HEPATIC FUNCTION PANEL
ALT: 47 U/L — ABNORMAL HIGH (ref 0–44)
AST: 37 U/L (ref 15–41)
Albumin: 3.4 g/dL — ABNORMAL LOW (ref 3.5–5.0)
Alkaline Phosphatase: 73 U/L (ref 38–126)
Bilirubin, Direct: 0.3 mg/dL — ABNORMAL HIGH (ref 0.0–0.2)
Indirect Bilirubin: 0.5 mg/dL (ref 0.3–0.9)
Total Bilirubin: 0.8 mg/dL (ref 0.3–1.2)
Total Protein: 7 g/dL (ref 6.5–8.1)

## 2021-12-21 LAB — PHOSPHORUS: Phosphorus: 3.7 mg/dL (ref 2.5–4.6)

## 2021-12-21 LAB — T4, FREE: Free T4: 1.97 ng/dL — ABNORMAL HIGH (ref 0.61–1.12)

## 2021-12-21 LAB — MRSA NEXT GEN BY PCR, NASAL: MRSA by PCR Next Gen: NOT DETECTED

## 2021-12-21 LAB — HIV ANTIBODY (ROUTINE TESTING W REFLEX): HIV Screen 4th Generation wRfx: NONREACTIVE

## 2021-12-21 MED ORDER — CARVEDILOL 12.5 MG PO TABS
12.5000 mg | ORAL_TABLET | Freq: Two times a day (BID) | ORAL | Status: DC
  Administered 2021-12-21 – 2021-12-23 (×4): 12.5 mg via ORAL
  Filled 2021-12-21 (×4): qty 1

## 2021-12-21 MED ORDER — PREDNISONE 20 MG PO TABS
40.0000 mg | ORAL_TABLET | Freq: Every day | ORAL | Status: DC
  Administered 2021-12-21 – 2021-12-25 (×5): 40 mg via ORAL
  Filled 2021-12-21 (×5): qty 2

## 2021-12-21 MED ORDER — SPIRONOLACTONE 12.5 MG HALF TABLET
12.5000 mg | ORAL_TABLET | Freq: Every day | ORAL | Status: DC
  Administered 2021-12-21 – 2021-12-25 (×5): 12.5 mg via ORAL
  Filled 2021-12-21 (×5): qty 1

## 2021-12-21 MED ORDER — POLYETHYLENE GLYCOL 3350 17 G PO PACK
17.0000 g | PACK | Freq: Every day | ORAL | Status: DC | PRN
  Administered 2021-12-24: 17 g via ORAL
  Filled 2021-12-21: qty 1

## 2021-12-21 MED ORDER — MAGNESIUM SULFATE 2 GM/50ML IV SOLN
2.0000 g | Freq: Once | INTRAVENOUS | Status: AC
  Administered 2021-12-21: 2 g via INTRAVENOUS
  Filled 2021-12-21: qty 50

## 2021-12-21 MED ORDER — MELATONIN 5 MG PO TABS
5.0000 mg | ORAL_TABLET | Freq: Every evening | ORAL | Status: DC | PRN
  Administered 2021-12-21: 5 mg via ORAL
  Filled 2021-12-21: qty 1

## 2021-12-21 MED ORDER — DAPAGLIFLOZIN PROPANEDIOL 10 MG PO TABS
10.0000 mg | ORAL_TABLET | Freq: Every day | ORAL | Status: DC
  Administered 2021-12-21 – 2021-12-25 (×5): 10 mg via ORAL
  Filled 2021-12-21 (×5): qty 1

## 2021-12-21 MED ORDER — ACETAMINOPHEN 325 MG PO TABS
650.0000 mg | ORAL_TABLET | Freq: Four times a day (QID) | ORAL | Status: DC | PRN
  Administered 2021-12-22 – 2021-12-24 (×4): 650 mg via ORAL
  Filled 2021-12-21 (×4): qty 2

## 2021-12-21 MED ORDER — PROCHLORPERAZINE EDISYLATE 10 MG/2ML IJ SOLN: 5.0000 mg | Freq: Four times a day (QID) | INTRAMUSCULAR | Status: DC | PRN

## 2021-12-21 MED ORDER — MAGNESIUM SULFATE 4 GM/100ML IV SOLN
4.0000 g | Freq: Once | INTRAVENOUS | Status: AC
  Administered 2021-12-21: 4 g via INTRAVENOUS
  Filled 2021-12-21: qty 100

## 2021-12-21 NOTE — Plan of Care (Signed)
  Problem: Clinical Measurements: Goal: Ability to maintain clinical measurements within normal limits will improve Outcome: Progressing   Problem: Health Behavior/Discharge Planning: Goal: Ability to manage health-related needs will improve Outcome: Progressing   Problem: Clinical Measurements: Goal: Will remain free from infection Outcome: Progressing   Problem: Clinical Measurements: Goal: Respiratory complications will improve Outcome: Progressing   Problem: Clinical Measurements: Goal: Cardiovascular complication will be avoided Outcome: Progressing

## 2021-12-21 NOTE — Plan of Care (Signed)
  Problem: Education: Goal: Knowledge of General Education information will improve Description: Including pain rating scale, medication(s)/side effects and non-pharmacologic comfort measures Outcome: Progressing   Problem: Health Behavior/Discharge Planning: Goal: Ability to manage health-related needs will improve Outcome: Progressing   Problem: Clinical Measurements: Goal: Ability to maintain clinical measurements within normal limits will improve Outcome: Progressing Goal: Will remain free from infection Outcome: Progressing Goal: Diagnostic test results will improve Outcome: Progressing Goal: Respiratory complications will improve Outcome: Progressing Goal: Cardiovascular complication will be avoided Outcome: Progressing   Problem: Activity: Goal: Risk for activity intolerance will decrease Outcome: Progressing   Problem: Nutrition: Goal: Adequate nutrition will be maintained Outcome: Progressing   Problem: Coping: Goal: Level of anxiety will decrease Outcome: Progressing   Problem: Elimination: Goal: Will not experience complications related to bowel motility Outcome: Progressing Goal: Will not experience complications related to urinary retention Outcome: Progressing   Problem: Pain Managment: Goal: General experience of comfort will improve Outcome: Progressing   Problem: Safety: Goal: Ability to remain free from injury will improve Outcome: Progressing   Problem: Skin Integrity: Goal: Risk for impaired skin integrity will decrease Outcome: Progressing   Problem: Education: Goal: Ability to demonstrate management of disease process will improve Outcome: Progressing Goal: Ability to verbalize understanding of medication therapies will improve Outcome: Progressing Goal: Individualized Educational Video(s) Outcome: Progressing   Problem: Activity: Goal: Capacity to carry out activities will improve Outcome: Progressing   Problem: Cardiac: Goal:  Ability to achieve and maintain adequate cardiopulmonary perfusion will improve Outcome: Progressing   Problem: Education: Goal: Knowledge of General Education information will improve Description: Including pain rating scale, medication(s)/side effects and non-pharmacologic comfort measures Outcome: Progressing   Problem: Health Behavior/Discharge Planning: Goal: Ability to manage health-related needs will improve Outcome: Progressing   Problem: Clinical Measurements: Goal: Ability to maintain clinical measurements within normal limits will improve Outcome: Progressing Goal: Will remain free from infection Outcome: Progressing Goal: Diagnostic test results will improve Outcome: Progressing Goal: Respiratory complications will improve Outcome: Progressing Goal: Cardiovascular complication will be avoided Outcome: Progressing   Problem: Activity: Goal: Risk for activity intolerance will decrease Outcome: Progressing   Problem: Nutrition: Goal: Adequate nutrition will be maintained Outcome: Progressing   Problem: Coping: Goal: Level of anxiety will decrease Outcome: Progressing   Problem: Elimination: Goal: Will not experience complications related to bowel motility Outcome: Progressing Goal: Will not experience complications related to urinary retention Outcome: Progressing   Problem: Pain Managment: Goal: General experience of comfort will improve Outcome: Progressing   Problem: Safety: Goal: Ability to remain free from injury will improve Outcome: Progressing   Problem: Skin Integrity: Goal: Risk for impaired skin integrity will decrease Outcome: Progressing   

## 2021-12-21 NOTE — TOC Progression Note (Signed)
Transition of Care [facility]) - Progression Note    Patient Details  Name: Joanna [name] MRN: [id number] Date of Birth: 15-Apr-1960  [facility] CM/SW Contact  Leone Haven, RN Phone Number: 12/21/2021, 12:17 PM  Clinical Narrative:    From home, acute/chronic CHF, Afib with RVR , on eliquis pta.  Conts on iv lasix.  TOC following.         Expected Discharge Plan and Services                                                 Social Determinants of Health (SDOH) Interventions    Readmission Risk Interventions     No data to display

## 2021-12-21 NOTE — Assessment & Plan Note (Signed)
No clinical signs of exacerbation  

## 2021-12-21 NOTE — Progress Notes (Signed)
  Progress Note   Patient: Joanna [name] GDJ:[id number] DOB: 05/09/1960 DOA: 12/20/2021     1 DOS: the patient was seen and examined on 12/21/2021   Brief hospital course: [name] was admitted to the hospital with the working diagnosis of decompensated heart failure.   61 yo female with the past medical history of COPD, heart failure, atrial fibrillation and hypothyroid who presented with dyspnea. Positive symptoms on exertion, associated with orthopnea. On her initial physical examination her blood pressure was 138/115, HR 97, RR 17 and 02 saturation 98%, lungs with rales bilaterally, heart with S1 and S2 present irregularly irregular, abdomen with no distention, positive lower extremity edema.   Na 144, K 3,1 Cl 111, bicarbonate 22, glucose 118 bun 13 cr 0,84  BNP 1,481  Wbc 6,3 hgb 13.1 plt 315  Sars covid 19 negative  TSH <0.010  Chest radiograph with cardiomegaly, bilateral hilar vascular congestion with bilateral interstitial infiltrates, right pleural effusion.   EKG 126 bpm, normal axis, qtc 506, atrial fibrillation rhythm, with no significant ST segment or T wave changes.   Assessment and Plan: * Acute on chronic diastolic CHF (congestive heart failure) (HCC) Echocardiogram from 2022 with preserved LV systolic function. Severe LA dilatation.   Her volume status has improved Urine output is 2,500 Systolic blood pressure is 117 to 130 mmHg.  Plan to continue diuresis with IV furosemide Carvedilol, dapagliflozin, entresto and spironolactone.   Acute cardiogenic pulmonary edema with right pleural effusion.   Atrial fibrillation with RVR (HCC) Persistent atrial fibrillation rate 120's personally reviewed telemetry Patient with palpitations  Plan to continue with carvedilol for rate control.   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    COPD (chronic obstructive pulmonary disease) (HCC) No clinical signs of exacerbation   Essential hypertension Continue carvedilol, entresto for blood pressure control, diuretic therapy with spironolactone and SGLT 2 inh.         Subjective: Patient continue to have dyspnea and palpitations, no chest pain   Physical Exam: Vitals:   12/21/21 1000 12/21/21 1116 12/21/21 1401 12/21/21 1508  BP: (!) 142/107 (!) 117/98 115/89 115/89  Pulse: 85 (!) 123 98   Resp: 19 18  20   Temp:  97.7 F (36.5 C)    TempSrc:  Oral    SpO2: 95% 95%    Weight:  86.3 kg    Height:  5' 10.5" (1.791 m)     Neurology awake and alert ENT with mild pallor Cardiovascular with S1 and S2 present, irregularly irregular and tachycardic Respiratory with no rales or wheezing Abdomen with no distention Trace lower extremity edema  Data Reviewed:    Family Communication: no family at the bedside   Disposition: Status is: Inpatient Remains inpatient appropriate because: heart failure   Planned Discharge Destination: Home    Author: 01-05-1987, MD 12/21/2021 3:11 PM  For on call review [email or web address].

## 2021-12-21 NOTE — Assessment & Plan Note (Addendum)
Continue carvedilol, entresto for blood pressure control, diuretic therapy with spironolactone, furosmide and SGLT 2 inh.

## 2021-12-21 NOTE — Assessment & Plan Note (Addendum)
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

## 2021-12-21 NOTE — Assessment & Plan Note (Addendum)
Echocardiogram with reduced LV systolic function 25 to 30%, with global hypokinesis, RV systolic function low normal. LA with severe dilatation, mild to moderate TR, severe MR, RVSP 47.5 mmHg. Trivial pericardial effusion.   Urine output is 2,500 Systolic blood pressure is 117 to 130 mmHg.  Diuresis with furosemide 40 mg IV q12 hrs  Carvedilol, dapagliflozin, entresto and spironolactone.  On entresto   Acute cardiogenic pulmonary edema with right pleural effusion.  Continue diuresis with furosemide.  Oxymetry is 99% on room air.

## 2021-12-21 NOTE — Assessment & Plan Note (Addendum)
Patient continue in atrial fibrillation but rate has improved to down to 70s and 80s.  Continue anticoagulation with apixaban.   Continue rate control with carvedilol and digoxin.  Thyrotoxicosis control with prednisone and methimazole.  Caution with digoxin interaction, will need level on 12/27/21.

## 2021-12-21 NOTE — Consult Note (Addendum)
Cardiology Consultation   Patient ID: Joanna [name] MRN: [id number]; DOB: June 03, 1960  Admit date: 12/20/2021 Date of Consult: 12/21/2021  PCP:  Marcine Matar, MD   [facility] Providers Cardiologist:  Kristeen Miss, MD   Patient Profile:   Joanna [name] is a 61 y.o. female with a hx of NICM, chronic combined CHF, PAF, chronic anticoagulation, tobacco abuse, and hypertension who is being seen 12/21/2021 for the evaluation of CHF and A-fib at the request of Dr. Margo Aye.  History of Present Illness:   Joanna [name] has a longstanding history of nonischemic cardiomyopathy with chronic combined systolic and diastolic heart failure with an LVEF as low as 20-25% and grade 2 DD.  She had normal coronaries by heart catheterization in 2007 - NICM felt related to uncontrolled hypertension.  Improved to 40-45% by echocardiogram in 2017.  During an admission in January 2020 she had hypertensive emergency with pulmonary edema requiring intubation.  This was in the setting of not taking her medications.  She was readmitted 03/08/2018 with dyspnea on exertion and chest pressure found to be in A-fib RVR -this was a new diagnosis.  Rate control was attempted but she eventually underwent TEE guided cardioversion 03/14/2018.  GDMT was titrated to include Entresto with plans to add spironolactone as an outpatient.  At clinic follow-up 03/2018 she was feeling better and walking about 2 miles each day.  Unfortunately she was back in A-fib.  Given severely dilated left atrium on echocardiogram, there was question that she would maintain sinus rhythm.  Given her age, amiodarone was avoided and beta-blocker increased.  No CCP for rate control given CHF.  She was referred to EP for antiarrhythmic options.  She was ultimately started on amiodarone.  At follow-up appointments she maintained sinus rhythm on amiodarone and felt well.  Repeat echo 10/04/20 showed improved LVEF 55-60% with moderate LVH and moderate MR.    Was last seen by Dr. Elease Hashimoto on 10/12/21.  TSH was significantly reduced compared to value 9 months prior. Amiodarone was reduced to 100 mg daily and then discontinued altogether due to concerns about thyroid function. Methimazole started.   She presented to [facility] ER via EMS 12/20/2021 for increased shortness of breath, DOE, pedal edema x3 days.  She was noted to be in A-fib RVR with elevated blood pressure.  BNP 1481 TSH < 0.01 Free T4 1.97 HST 32 --> 28 K 3.0 Mg 1.5  CXR with pulmonary edema and small B pleural effusions concerning for CHF.  IV diuresis was started along with BB. She was admitted to medicine service and cardiology asked to consult.   Echo pending today.  She describes onset of heart pounding and shortness of breath about 4-5 days ago. History is difficult in terms of timeline of symptoms. She is unable to lay flat. She reports urine output with lasix. She is still short of breath and can appreciate her heart beating I her "back." She denies chest pain to me. She reports compliance on medications.    Past Medical History:  Diagnosis Date   Atrial fibrillation with RVR (HCC)    Chronic combined systolic (congestive) and diastolic (congestive) heart failure (HCC)    Dyspnea    Goiter    Hypertension    NICM (nonischemic cardiomyopathy) (HCC)    a. 08/2005 Echo: EF 30-35%, mod diff HK. Mild to Mod MR. Mildly dil LA; b. 08/2005 Cath: Nl Cors. Elevated CO w/o shunt; c. 09/2007 Echo: EF 45%. Mild to mod MR; c. 03/2015  Echo: EF 45%, global HK. Gr1 DD. Mild MR. Mildly dil LA.    Past Surgical History:  Procedure Laterality Date   APPENDECTOMY     BACK SURGERY     CARDIOVERSION N/A 03/14/2018   Procedure: CARDIOVERSION;  Surgeon: Pricilla Riffle, MD;  Location: [facility] ENDOSCOPY;  Service: Cardiovascular;  Laterality: N/A;   TEE WITHOUT CARDIOVERSION N/A 03/14/2018   Procedure: TRANSESOPHAGEAL ECHOCARDIOGRAM (TEE);  Surgeon: Pricilla Riffle, MD;  Location: [facility] ENDOSCOPY;  Service:  Cardiovascular;  Laterality: N/A;     Home Medications:  Prior to Admission medications   Medication Sig Start Date End Date Taking? Authorizing Provider  ELIQUIS 5 MG TABS tablet TAKE 1 TABLET BY MOUTH TWICE  DAILY Patient taking differently: Take 5 mg by mouth 2 (two) times daily. 10/02/21  Yes Nahser, Deloris Ping, MD  furosemide (LASIX) 20 MG tablet TAKE 1 TABLET BY MOUTH  DAILY Patient taking differently: Take 20 mg by mouth daily. 06/16/21  Yes Nahser, Deloris Ping, MD  methimazole (TAPAZOLE) 5 MG tablet Take 1 tablet (5 mg total) by mouth daily. 10/16/21  Yes Nahser, Deloris Ping, MD  metoprolol tartrate (LOPRESSOR) 50 MG tablet TAKE 1 TABLET BY MOUTH TWICE  DAILY Patient taking differently: Take 50 mg by mouth 2 (two) times daily. 06/29/21  Yes Nahser, Deloris Ping, MD  sacubitril-valsartan (ENTRESTO) 97-103 MG Take 1 tablet by mouth 2 (two) times daily. 06/29/21  Yes Nahser, Deloris Ping, MD    Inpatient Medications: Scheduled Meds:  apixaban  5 mg Oral BID   furosemide  40 mg Intravenous BID   methimazole  5 mg Oral Daily   metoprolol tartrate  25 mg Oral BID   potassium chloride  40 mEq Oral BID   sacubitril-valsartan  1 tablet Oral BID   Continuous Infusions:  PRN Meds: acetaminophen, melatonin, polyethylene glycol, prochlorperazine  Allergies:    Allergies  Allergen Reactions   Other Other (See Comments)    Spicy foods- Cause a lot of heartburn    Social History:   Social History   Socioeconomic History   Marital status: Single    Spouse name: Not on file   Number of children: Not on file   Years of education: Not on file   Highest education level: Not on file  Occupational History   Occupation: unemployed  Tobacco Use   Smoking status: Former    Packs/day: 2.00    Years: 30.00    Total pack years: 60.00    Types: Cigarettes    Quit date: 02/08/2018    Years since quitting: 3.8   Smokeless tobacco: Never  Vaping Use   Vaping Use: Never used  Substance and Sexual Activity    Alcohol use: No    Comment: quit drinking year ago   Drug use: No   Sexual activity: Not on file  Other Topics Concern   Not on file  Social History Narrative   Not on file   Social Determinants of Health   Financial Resource Strain: Not on file  Food Insecurity: Not on file  Transportation Needs: Not on file  Physical Activity: Not on file  Stress: Not on file  Social Connections: Not on file  Intimate Partner Violence: Not on file    Family History:    Family History  Problem Relation Age of Onset   Cancer Mother    Heart disease Father      ROS:  Please see the history of present illness.   All other ROS  reviewed and negative.     Physical Exam/Data:   Vitals:   12/21/21 0600 12/21/21 0700 12/21/21 0820 12/21/21 1000  BP: 123/76 (!) 127/93  (!) 142/107  Pulse: 77 92  85  Resp: 14 17  19   Temp:   98 F (36.7 C)   TempSrc:   Oral   SpO2: 98% 96%  95%    Intake/Output Summary (Last 24 hours) at 12/21/2021 1020 Last data filed at 12/21/2021 0933 Gross per 24 hour  Intake --  Output 3400 ml  Net -3400 ml      10/12/2021    9:12 AM 03/03/2021    9:23 AM 08/03/2020    7:49 AM  Last 3 Weights  Weight (lbs) 196 lb 6.4 oz 214 lb 12.8 oz 218 lb 3.2 oz  Weight (kg) 89.086 kg 97.433 kg 98.975 kg     There is no height or weight on file to calculate BMI.  General:  Well nourished, well developed, in no acute distress HEENT: normal Neck: + JVD Cardiac:  irregular rhythm, tachycardic rate Lungs:  poor air movement, diminished in bases R > L  Abd: soft, nontender, no hepatomegaly  Ext: minimal LE edema Musculoskeletal:  No deformities, BUE and BLE strength normal and equal Skin: warm and dry  Neuro:  CNs 2-12 intact, no focal abnormalities noted Psych:  Normal affect   EKG:  The EKG was personally reviewed and demonstrates:  Afib with VR 126, poor R wave progression Telemetry:  Telemetry was personally reviewed and demonstrates:  Afib with rates ranging  80-150s  Relevant CV Studies:  Echo pending  Echo 09/2020:  1. Left ventricular ejection fraction, by estimation, is 55 to 60%. The  left ventricle has normal function. The left ventricle has no regional  wall motion abnormalities. There is moderate left ventricular hypertrophy.  Left ventricular diastolic  parameters are indeterminate.   2. Right ventricular systolic function is normal. The right ventricular  size is normal.   3. Left atrial size was severely dilated.   4. The mitral valve is grossly normal. Moderate mitral valve  regurgitation.   5. The aortic valve is normal in structure. Aortic valve regurgitation is  not visualized. No aortic stenosis is present.    Echo 03/2018:  1. The left ventricle has severely reduced systolic function of 20-25%.  The cavity size is severely increased. There is no left ventricular wall  thickness. Echo evidence of pseudonormal diastolic filling patterns.   2. Grade 2 diastolic dysfunction.   3. The right ventricle is in size. There is normal systolic function.  Right ventricular systolic pressure is severely elevated with an estimated  pressure of 59.9 mmHg.   4. Massively dilated left atrial size.   5. Normal right atrial size.   6. Trivial pericardial effusion, as described above.   7. The mitral valve normal in structure. Regurgitation is severe by color  flow Doppler.   8. Likely severe mitral regurgitation directed posteriorly and towards  the lateral wall. Coanda effect noted. Likely functional mitral  regurgitation due to dilated left ventricle.   9. Normal tricuspid valve.  10. Tricuspid regurgitation moderate.  11. Pulmonic valve regurgitation is mild by color flow Doppler.  12. The inferior vena cava was dilated in size with <50% respiratory  variablity.  13. No atrial level shunt detected by color flow Doppler.    Laboratory Data:  High Sensitivity Troponin:   Recent Labs  Lab 12/20/21 2005 12/20/21 2138   TROPONINIHS 32* 28*  Chemistry Recent Labs  Lab 12/20/21 1313 12/20/21 2005 12/21/21 0140  NA 144  --  142  K 3.1*  --  3.0*  CL 111  --  109  CO2 22  --  24  GLUCOSE 118*  --  133*  BUN 13  --  13  CREATININE 0.84  --  0.77  CALCIUM 9.1  --  9.2  MG  --  1.6* 1.5*  GFRNONAA >60  --  >60  ANIONGAP 11  --  9    Recent Labs  Lab 12/20/21 2005 12/21/21 0140  PROT 7.0 6.7  ALBUMIN 3.4* 3.4*  AST 37 34  ALT 47* 46*  ALKPHOS 73 79  BILITOT 0.8 0.7   Lipids No results for input(s): "CHOL", "TRIG", "HDL", "LABVLDL", "LDLCALC", "CHOLHDL" in the last 168 hours.  Hematology Recent Labs  Lab 12/20/21 1313 12/21/21 0140  WBC 6.3 5.7  RBC 4.78 4.87  HGB 13.1 13.0  HCT 41.2 41.0  MCV 86.2 84.2  MCH 27.4 26.7  MCHC 31.8 31.7  RDW 13.9 14.0  PLT 315 293   Thyroid  Recent Labs  Lab 12/20/21 2000 12/20/21 2005  TSH <0.010*  --   FREET4  --  1.97*    BNP Recent Labs  Lab 12/20/21 1313  BNP 1,481.1*    DDimer No results for input(s): "DDIMER" in the last 168 hours.   Radiology/Studies:  DG Chest 2 View  Result Date: 12/20/2021 CLINICAL DATA:  Chest pain, shortness of breath EXAM: CHEST - 2 VIEW COMPARISON:  03/08/2018 FINDINGS: Transverse diameter of heart is increased. Central pulmonary vessels are more prominent. There is subtle increase in interstitial markings in parahilar regions and lower lung fields. Small bilateral pleural effusions are seen, more so on the right side. There is no pneumothorax. IMPRESSION: Cardiomegaly. Central pulmonary vessels are more prominent suggesting CHF. Increased interstitial markings in both lungs suggest pulmonary edema. Small bilateral pleural effusions. Electronically Signed   By: Ernie Avena M.D.   On: 12/20/2021 13:41     Assessment and Plan:   NICM - likely multifactorial including tachycardia and hypertension mediated Acute on chronic combined heart failure LVEF as low as 20-25% and grade 2 DD improved to  normal with restoration of sinus rhythm with amiodarone and GDMT with BP control Given presenting symptoms, CXR, and labs, suspect EF reduced, will await formal echo GDMT: 97-103 entresto Has received 40 mg IV lasix x 1 dose sCr 0.77 Would increase to 80 mg IV lasix for today with strict I&Os Consider adding spironolactone +/- jardiance   Afib RVR Paroxysmal atrial fibrillation Amiodarone stopped in Sept 2023 due to new hyperthyroidism This event likely corresponds to the end of the amiodarone washout Will need to consider alternative to restore sinus rhythm - given her NICM, I do not think she will do well in Afib long term - likely consult EP for possible tikosyn vs consideration of ablation, unsure if she will be a candidate for ablation given her MR and dilated left atrium - low dose lopressor 25 mg BID for rate control, complicated by  heart failure - we will stop metoprolol and start 12.5 mg coreg for rate control - nonselective BB may be better for hyperthyroidism symptoms   Chronic anticoagulation Continue eliquis   Hyperthyroidism TSH undetectable, elevated free T4 Has been on methimazole Appreciate primary input Will likely need OP endocrinology   Hypokalemia Hypomagnesemia  Would replace aggressively - I have added an additional 4 g IV Mg  Complex clinical situation. Unclear if CHF exacerbation or Afib RVR was the initial change. Afib RVR corresponds with amiodarone washout and is complicated by hyperthyroidism. I think rhythm management will be important for her, but options are limited given CHF and thyroid function. Will likely need EP input, but Afib may be difficult to control until hyperthyroidism is controlled.   Risk Assessment/Risk Scores:    New York Heart Association (NYHA) Functional Class NYHA Class IV  CHA2DS2-VASc Score = 3   This indicates a 3.2% annual risk of stroke. The patient's score is based upon: CHF History: 1 HTN History:  1 Diabetes History: 0 Stroke History: 0 Vascular Disease History: 0 Age Score: 0 Gender Score: 1    For questions or updates, please contact [facility] Please consult [email or web address] for contact info under    Signed, Marcelino Duster, PA  12/21/2021 10:20 AM As above, patient seen and examined.  Briefly she is a 61 year old female with past medical history of nonischemic cardiomyopathy improved, mitral regurgitation, persistent atrial fibrillation, chronic combined systolic/diastolic congestive heart failure, hypertension, tobacco abuse for evaluation of atrial fibrillation and acute on chronic combined systolic/diastolic congestive heart failure.  Patient has a history of atrial fibrillation that recurred following cardioversion.  She was placed on amiodarone but developed hyperthyroidism.  Amiodarone was discontinued on October 16, 2021.  Over the past 4 to 5 days she notes increased dyspnea on exertion.  She denies orthopnea or PND but did have pedal edema.  There is no chest pain but she did have palpitations.  No syncope.  She has been admitted with atrial fibrillation with rapid ventricular response and CHF.  Cardiology now asked to evaluate. Chest x-ray shows CHF.  Creatinine 0.77, potassium 3.0, BNP 1481, troponin 32 and 28, hemoglobin 13, TSH less than 1.010, free T4 1.97. Electrocardiogram shows atrial fibrillation with rapid ventricular sponsor nonspecific ST changes.  1 atrial fibrillation with rapid ventricular response-this is likely being driven by hyperthyroidism.  Discontinue metoprolol and treat with carvedilol 12.5 mg twice daily.  Follow heart rate and advance as needed.  Could also consider addition of digoxin.  Continue apixaban.  Once her hyperthyroidism is treated could refer to atrial fibrillation clinic for consideration of Tikosyn versus ablation.  I do not think she will hold sinus rhythm in the setting of hyperthyroidism at this point.  2 acute on  chronic combined systolic/diastolic congestive heart failure-plan to continue Lasix 40 mg IV twice daily and follow renal function.  Continue Entresto.  Add Farxiga 10 mg daily. Add spironolactone 12.5 mg daily. Discontinue metoprolol and treat with carvedilol 12.5 mg twice daily.  Repeat echocardiogram.  Presentation is concerning that she may have developed recurrent tachycardia mediated cardiomyopathy.  3 mitral regurgitation-moderate on most recent echocardiogram.  We will reassess with follow-up echocardiogram.  4 hyperthyroidism-amiodarone has been discontinued since September 11.  Agree with methimazole.  Once hyperthyroidism is corrected will address atrial fibrillation.  5 hypertension-follow blood pressure and adjust medications as needed.  6 tobacco abuse-patient counseled on discontinuing.  Olga Millers, MD

## 2021-12-21 NOTE — Progress Notes (Signed)
  Echocardiogram 2D Echocardiogram has been performed.  Joanna [name] 12/21/2021, 10:58 AM

## 2021-12-21 NOTE — H&P (Signed)
History and Physical  Joanna [name] OYD:[id number] DOB: 06/11/1960 DOA: 12/20/2021  Referring physician: Dr. Jeraldine Loots, EDP  PCP: Marcine Matar, MD  Outpatient Specialists: Cardiology Patient coming from: Home.  Chief Complaint: Shortness of breath.  HPI: Joanna [name] is a 61 y.o. female with medical history significant for paroxysmal A-fib on Eliquis, chronic diastolic CHF, hypothyroidism, COPD, tobacco use disorder, who presented to [facility] ED with progressive dyspnea, worse with ambulation.  Associated with orthopnea.  Denies any chest pain.  In the ED noted to be in A-fib with RVR.  Right JVD on exam, elevated BNP greater than 1400, pulmonary edema and bilateral pleural effusion on chest x-ray.  Due to concern for acute on chronic diastolic CHF IV diuresis was initiated in the ED.  TRH, hospitalist service, was asked to admit.  ED Course: BP 138/115, pulse 97, respiratory 17, O2 saturation 98% on room air.  Lab studies remarkable for serum potassium 3.0, glucose 133, magnesium 1.5, ALT 46.  CBC essentially unremarkable.  Review of Systems: Review of systems as noted in the HPI. All other systems reviewed and are negative.   Past Medical History:  Diagnosis Date   Atrial fibrillation with RVR (HCC)    Chronic combined systolic (congestive) and diastolic (congestive) heart failure (HCC)    Dyspnea    Goiter    Hypertension    NICM (nonischemic cardiomyopathy) (HCC)    a. 08/2005 Echo: EF 30-35%, mod diff HK. Mild to Mod MR. Mildly dil LA; b. 08/2005 Cath: Nl Cors. Elevated CO w/o shunt; c. 09/2007 Echo: EF 45%. Mild to mod MR; c. 03/2015 Echo: EF 45%, global HK. Gr1 DD. Mild MR. Mildly dil LA.   Past Surgical History:  Procedure Laterality Date   APPENDECTOMY     BACK SURGERY     CARDIOVERSION N/A 03/14/2018   Procedure: CARDIOVERSION;  Surgeon: Pricilla Riffle, MD;  Location: [facility] ENDOSCOPY;  Service: Cardiovascular;  Laterality: N/A;   TEE WITHOUT CARDIOVERSION N/A 03/14/2018    Procedure: TRANSESOPHAGEAL ECHOCARDIOGRAM (TEE);  Surgeon: Pricilla Riffle, MD;  Location: [facility] ENDOSCOPY;  Service: Cardiovascular;  Laterality: N/A;    Social History:  reports that she quit smoking about 3 years ago. Her smoking use included cigarettes. She has a 60.00 pack-year smoking history. She has never used smokeless tobacco. She reports that she does not drink alcohol and does not use drugs.   Allergies  Allergen Reactions   Other Other (See Comments)    Spicy foods- Cause a lot of heartburn    Family History  Problem Relation Age of Onset   Cancer Mother    Heart disease Father       Prior to Admission medications   Medication Sig Start Date End Date Taking? Authorizing Provider  ELIQUIS 5 MG TABS tablet TAKE 1 TABLET BY MOUTH TWICE  DAILY Patient taking differently: Take 5 mg by mouth 2 (two) times daily. 10/02/21  Yes Nahser, Deloris Ping, MD  furosemide (LASIX) 20 MG tablet TAKE 1 TABLET BY MOUTH  DAILY Patient taking differently: Take 20 mg by mouth daily. 06/16/21  Yes Nahser, Deloris Ping, MD  methimazole (TAPAZOLE) 5 MG tablet Take 1 tablet (5 mg total) by mouth daily. 10/16/21  Yes Nahser, Deloris Ping, MD  metoprolol tartrate (LOPRESSOR) 50 MG tablet TAKE 1 TABLET BY MOUTH TWICE  DAILY Patient taking differently: Take 50 mg by mouth 2 (two) times daily. 06/29/21  Yes Nahser, Deloris Ping, MD  sacubitril-valsartan (ENTRESTO) 97-103 MG Take 1 tablet by mouth  2 (two) times daily. 06/29/21  Yes Nahser, Deloris Ping, MD    Physical Exam: BP (!) 132/98   Pulse 97   Temp 99 F (37.2 C) (Oral)   Resp 17   SpO2 98%   General: 61 y.o. year-old female well developed well nourished in no acute distress.  Alert and oriented x3. Cardiovascular: Regular rate and rhythm with no rubs or gallops.  No thyromegaly or JVD noted.  1+ pitting edema in lower extremities bilaterally.   Respiratory: Mild rales at bases with no wheezing noted.  Poor inspiratory effort. Abdomen: Soft nontender nondistended  with normal bowel sounds x4 quadrants. Muskuloskeletal: No cyanosis, clubbing.  1+ pitting edema lower extremities bilaterally. Neuro: CN II-XII intact, strength, sensation, reflexes Skin: No ulcerative lesions noted or rashes Psychiatry: Judgement and insight appear normal. Mood is appropriate for condition and setting          Labs on Admission:  Basic Metabolic Panel: Recent Labs  Lab 12/20/21 1313 12/20/21 2005 12/21/21 0140  NA 144  --  142  K 3.1*  --  3.0*  CL 111  --  109  CO2 22  --  24  GLUCOSE 118*  --  133*  BUN 13  --  13  CREATININE 0.84  --  0.77  CALCIUM 9.1  --  9.2  MG  --  1.6* 1.5*  PHOS  --   --  3.7   Liver Function Tests: Recent Labs  Lab 12/20/21 2005 12/21/21 0140  AST 37 34  ALT 47* 46*  ALKPHOS 73 79  BILITOT 0.8 0.7  PROT 7.0 6.7  ALBUMIN 3.4* 3.4*   No results for input(s): "LIPASE", "AMYLASE" in the last 168 hours. No results for input(s): "AMMONIA" in the last 168 hours. CBC: Recent Labs  Lab 12/20/21 1313 12/21/21 0140  WBC 6.3 5.7  NEUTROABS 4.4 3.1  HGB 13.1 13.0  HCT 41.2 41.0  MCV 86.2 84.2  PLT 315 293   Cardiac Enzymes: No results for input(s): "CKTOTAL", "CKMB", "CKMBINDEX", "TROPONINI" in the last 168 hours.  BNP (last 3 results) Recent Labs    12/20/21 1313  BNP 1,481.1*    ProBNP (last 3 results) No results for input(s): "PROBNP" in the last 8760 hours.  CBG: No results for input(s): "GLUCAP" in the last 168 hours.  Radiological Exams on Admission: DG Chest 2 View  Result Date: 12/20/2021 CLINICAL DATA:  Chest pain, shortness of breath EXAM: CHEST - 2 VIEW COMPARISON:  03/08/2018 FINDINGS: Transverse diameter of heart is increased. Central pulmonary vessels are more prominent. There is subtle increase in interstitial markings in parahilar regions and lower lung fields. Small bilateral pleural effusions are seen, more so on the right side. There is no pneumothorax. IMPRESSION: Cardiomegaly. Central  pulmonary vessels are more prominent suggesting CHF. Increased interstitial markings in both lungs suggest pulmonary edema. Small bilateral pleural effusions. Electronically Signed   By: Ernie Avena M.D.   On: 12/20/2021 13:41    EKG: I independently viewed the EKG done and my findings are as followed: Atrial fibrillation with rate of 126.  Nonspecific ST-T changes.  QTc 506.  Assessment/Plan Present on Admission:  Acute on chronic diastolic CHF (congestive heart failure) (HCC)  Principal Problem:   Acute on chronic diastolic CHF (congestive heart failure) (HCC)  Acute on chronic diastolic CHF Progressive dyspnea Right JVD on exam, elevated BNP greater than 1400, pulmonary edema and bilateral pleural effusion on chest x-ray.  Last 2D echo done on 10/04/2020  showed LVEF 55 to 60% Reports compliance with all her medications. Continue IV diuresis with electrolytes replacement Continue home cardiac medications Monitor strict I's and O's and daily weight  Paroxysmal A-fib with RVR Improved after metoprolol p.o. 25 mg twice daily. Resume home Eliquis for CVA prevention  Hyperthyroidism Resume home methimazole  Hypothyroidism Serum potassium 3.0 Repleted orally  Hypomagnesemia Serum magnesium 1.5 Repleted intravenously.  Isolated ALT ALT 46 Monitor for now.  Mild hyperglycemia Serum glucose 133 Monitor for now    DVT prophylaxis: Home Eliquis  Code Status: Full code  Family Communication: None at bedside  Disposition Plan: Admitted to telemetry cardiac unit  Consults called: None  Admission status: Inpatient status   Status is: Inpatient The patient requires at least 2 midnights for further evaluation and treatment of present condition.   Darlin Drop MD [facility] Pager [phone]  If 7PM-7AM, please contact night-coverage [email or web address] Password [email or web address]  12/21/2021, 4:07 AM

## 2021-12-21 NOTE — Hospital Course (Addendum)
Joanna [name] was admitted to the hospital with the working diagnosis of decompensated heart failure.   61 yo female with the past medical history of COPD, heart failure, atrial fibrillation and hypothyroid who presented with dyspnea. Positive symptoms on exertion, associated with orthopnea. On her initial physical examination her blood pressure was 138/115, HR 97, RR 17 and 02 saturation 98%, lungs with rales bilaterally, heart with S1 and S2 present irregularly irregular, abdomen with no distention, positive lower extremity edema.   Na 144, K 3,1 Cl 111, bicarbonate 22, glucose 118 bun 13 cr 0,84  BNP 1,481  Wbc 6,3 hgb 13.1 plt 315  Sars covid 19 negative  TSH <0.010  Chest radiograph with cardiomegaly, bilateral hilar vascular congestion with bilateral interstitial infiltrates, right pleural effusion.   EKG 126 bpm, normal axis, qtc 506, atrial fibrillation rhythm, with no significant ST segment or T wave changes.   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  11/18 improved rate control atrial fibrillation  11/19 continue to improve heart rate, will need digoxin level on 11/22.

## 2021-12-21 NOTE — ED Notes (Signed)
ED TO INPATIENT HANDOFF REPORT  ED Nurse Name and Phone #: Joice Lofts [phone]  S Name/Age/Gender Joanna [name] 61 y.o. female Room/Bed: 011C/011C  Code Status   Code Status: Full Code  Home/SNF/Other Home Patient oriented to: self, place, time, and situation Is this baseline? Yes   Triage Complete: Triage complete  Chief Complaint Acute on chronic diastolic CHF (congestive heart failure) (HCC) [I50.33]  Triage Note Pt bib ems with increased shob and exertional cp with pedal edema X3 days. Endorses 1 episode NV with the exertion. Poor historian. Pt showing afib 80-160's. 160/120. Given 324mg  asa and 1 NTG. 150/108 after NTG. 22g L hand.    Allergies Allergies  Allergen Reactions   Other Other (See Comments)    Spicy foods- Cause a lot of heartburn    Level of Care/Admitting Diagnosis ED Disposition     ED Disposition  Admit   Condition  --   Comment  Hospital [facility]: [facility] [[id number]]  Level of Care: Telemetry Cardiac [[id number]]  May admit patient to [facility] or [facility] if equivalent level of care is available:: Yes  Covid Evaluation: Asymptomatic - no recent exposure (last 10 days) testing not required  Diagnosis: Acute on chronic diastolic CHF (congestive heart failure) [facility]) [facility]  Admitting Physician: [id number] Darlin Drop  Attending Physician: [[id number] Darlin Drop  Certification:: I certify this patient will need inpatient services for at least 2 midnights  Estimated Length of Stay: 2          B Medical/Surgery History Past Medical History:  Diagnosis Date   Atrial fibrillation with RVR (HCC)    Chronic combined systolic (congestive) and diastolic (congestive) heart failure (HCC)    Dyspnea    Goiter    Hypertension    NICM (nonischemic cardiomyopathy) (HCC)    a. 08/2005 Echo: EF 30-35%, mod diff HK. Mild to Mod MR. Mildly dil LA; b. 08/2005 Cath: Nl Cors. Elevated CO w/o shunt; c. 09/2007 Echo: EF 45%. Mild to mod MR; c.  03/2015 Echo: EF 45%, global HK. Gr1 DD. Mild MR. Mildly dil LA.   Past Surgical History:  Procedure Laterality Date   APPENDECTOMY     BACK SURGERY     CARDIOVERSION N/A 03/14/2018   Procedure: CARDIOVERSION;  Surgeon: 05/13/2018, MD;  Location: [facility] ENDOSCOPY;  Service: Cardiovascular;  Laterality: N/A;   TEE WITHOUT CARDIOVERSION N/A 03/14/2018   Procedure: TRANSESOPHAGEAL ECHOCARDIOGRAM (TEE);  Surgeon: 05/13/2018, MD;  Location: [facility] ENDOSCOPY;  Service: Cardiovascular;  Laterality: N/A;     A IV Location/Drains/Wounds Patient Lines/Drains/Airways Status     Active Line/Drains/Airways     Name Placement date Placement time Site Days   Peripheral IV 12/20/21 22 G Left;Posterior Hand 12/20/21  1918  Hand  1            Intake/Output Last 24 hours  Intake/Output Summary (Last 24 hours) at 12/21/2021 0906 Last data filed at 12/21/2021 12/23/2021 Gross per 24 hour  Intake --  Output 2500 ml  Net -2500 ml    Labs/Imaging Results for orders placed or performed during the hospital encounter of 12/20/21 (from the past 48 hour(s))  Resp Panel by RT-PCR (Flu A&B, Covid) Anterior Nasal Swab     Status: None   Collection Time: 12/20/21  1:08 PM   Specimen: Anterior Nasal Swab  Result Value Ref Range   SARS Coronavirus 2 by RT PCR NEGATIVE NEGATIVE    Comment: (NOTE) SARS-CoV-2 target nucleic acids are  NOT DETECTED.  The SARS-CoV-2 RNA is generally detectable in upper respiratory specimens during the acute phase of infection. The lowest concentration of SARS-CoV-2 viral copies this assay can detect is 138 copies/mL. A negative result does not preclude SARS-Cov-2 infection and should not be used as the sole basis for treatment or other patient management decisions. A negative result may occur with  improper specimen collection/handling, submission of specimen other than nasopharyngeal swab, presence of viral mutation(s) within the areas targeted by this assay, and inadequate number  of viral copies(<138 copies/mL). A negative result must be combined with clinical observations, patient history, and epidemiological information. The expected result is Negative.  Fact Sheet for Patients:  BloggerCourse.com  Fact Sheet for Healthcare Providers:  SeriousBroker.it  This test is no t yet approved or cleared by the [place] FDA and  has been authorized for detection and/or diagnosis of SARS-CoV-2 by FDA under an Emergency Use Authorization (EUA). This EUA will remain  in effect (meaning this test can be used) for the duration of the COVID-19 declaration under Section 564(b)(1) of the Act, 21 U.S.C.section 360bbb-3(b)(1), unless the authorization is terminated  or revoked sooner.       Influenza A by PCR NEGATIVE NEGATIVE   Influenza B by PCR NEGATIVE NEGATIVE    Comment: (NOTE) The Xpert Xpress SARS-CoV-2/FLU/RSV plus assay is intended as an aid in the diagnosis of influenza from Nasopharyngeal swab specimens and should not be used as a sole basis for treatment. Nasal washings and aspirates are unacceptable for Xpert Xpress SARS-CoV-2/FLU/RSV testing.  Fact Sheet for Patients: BloggerCourse.com  Fact Sheet for Healthcare Providers: SeriousBroker.it  This test is not yet approved or cleared by the [place] FDA and has been authorized for detection and/or diagnosis of SARS-CoV-2 by FDA under an Emergency Use Authorization (EUA). This EUA will remain in effect (meaning this test can be used) for the duration of the COVID-19 declaration under Section 564(b)(1) of the Act, 21 U.S.C. section 360bbb-3(b)(1), unless the authorization is terminated or revoked.  Performed at [facility], [address] [place], [place] [address]   Basic metabolic panel     Status: Abnormal   Collection Time: 12/20/21  1:13 PM  Result Value Ref Range   Sodium 144 135  - 145 mmol/L   Potassium 3.1 (L) 3.5 - 5.1 mmol/L   Chloride 111 98 - 111 mmol/L   CO2 22 22 - 32 mmol/L   Glucose, Bld 118 (H) 70 - 99 mg/dL    Comment: Glucose reference range applies only to samples taken after fasting for at least 8 hours.   BUN 13 8 - 23 mg/dL   Creatinine, Ser 5.83 0.44 - 1.00 mg/dL   Calcium 9.1 8.9 - 09.4 mg/dL   GFR, Estimated >07 >68 mL/min    Comment: (NOTE) Calculated using the CKD-EPI Creatinine Equation (2021)    Anion gap 11 5 - 15    Comment: Performed at [facility], [address], [place], [place] [address]  CBC with Differential     Status: None   Collection Time: 12/20/21  1:13 PM  Result Value Ref Range   WBC 6.3 4.0 - 10.5 K/uL   RBC 4.78 3.87 - 5.11 MIL/uL   Hemoglobin 13.1 12.0 - 15.0 g/dL   HCT 03.1 59.4 - 58.5 %   MCV 86.2 80.0 - 100.0 fL   MCH 27.4 26.0 - 34.0 pg   MCHC 31.8 30.0 - 36.0 g/dL   RDW  13.9 11.5 - 15.5 %   Platelets 315 150 - 400 K/uL   nRBC 0.0 0.0 - 0.2 %   Neutrophils Relative % 69 %   Neutro Abs 4.4 1.7 - 7.7 K/uL   Lymphocytes Relative 23 %   Lymphs Abs 1.5 0.7 - 4.0 K/uL   Monocytes Relative 6 %   Monocytes Absolute 0.4 0.1 - 1.0 K/uL   Eosinophils Relative 1 %   Eosinophils Absolute 0.1 0.0 - 0.5 K/uL   Basophils Relative 1 %   Basophils Absolute 0.0 0.0 - 0.1 K/uL   Immature Granulocytes 0 %   Abs Immature Granulocytes 0.02 0.00 - 0.07 K/uL    Comment: Performed at [facility], [address], [place], [place] [address]  Brain natriuretic peptide     Status: Abnormal   Collection Time: 12/20/21  1:13 PM  Result Value Ref Range   B Natriuretic Peptide 1,481.1 (H) 0.0 - 100.0 pg/mL    Comment: Performed at [facility], [address], [place], [place] [address]  TSH     Status: Abnormal   Collection Time: 12/20/21  8:00 PM  Result Value Ref Range   TSH <0.010 (L) 0.350 - 4.500 uIU/mL    Comment: Performed by a 3rd Generation assay with a functional sensitivity of <=0.01  uIU/mL. Performed at [facility], [address], [place], [place] [address]   Troponin I (High Sensitivity)     Status: Abnormal   Collection Time: 12/20/21  8:05 PM  Result Value Ref Range   Troponin I (High Sensitivity) 32 (H) <18 ng/L    Comment: (NOTE) Elevated high sensitivity troponin I (hsTnI) values and significant  changes across serial measurements may suggest ACS but many other  chronic and acute conditions are known to elevate hsTnI results.  Refer to the Links section for chest pain algorithms and additional  guidance. Performed at [facility], [address], [place], [place] [address]   T4, free     Status: Abnormal   Collection Time: 12/20/21  8:05 PM  Result Value Ref Range   Free T4 1.97 (H) 0.61 - 1.12 ng/dL    Comment: (NOTE) Biotin ingestion may interfere with free T4 tests. If the results are inconsistent with the TSH level, previous test results, or the clinical presentation, then consider biotin interference. If needed, order repeat testing after stopping biotin. Performed at [facility], [address], [place], [place] [address]   Hepatic function panel     Status: Abnormal   Collection Time: 12/20/21  8:05 PM  Result Value Ref Range   Total Protein 7.0 6.5 - 8.1 g/dL   Albumin 3.4 (L) 3.5 - 5.0 g/dL   AST 37 15 - 41 U/L   ALT 47 (H) 0 - 44 U/L   Alkaline Phosphatase 73 38 - 126 U/L   Total Bilirubin 0.8 0.3 - 1.2 mg/dL   Bilirubin, Direct 0.3 (H) 0.0 - 0.2 mg/dL   Indirect Bilirubin 0.5 0.3 - 0.9 mg/dL    Comment: Performed at [facility], [address], [place], [place] [address]  Magnesium     Status: Abnormal   Collection Time: 12/20/21  8:05 PM  Result Value Ref Range   Magnesium 1.6 (L) 1.7 - 2.4 mg/dL    Comment: Performed at [facility], [address], [place], [place] [address]  Troponin I (High Sensitivity)     Status: Abnormal  Collection Time: 12/20/21  9:38 PM  Result Value Ref Range    Troponin I (High Sensitivity) 28 (H) <18 ng/L    Comment: (NOTE) Elevated high sensitivity troponin I (hsTnI) values and significant  changes across serial measurements may suggest ACS but many other  chronic and acute conditions are known to elevate hsTnI results.  Refer to the "Links" section for chest pain algorithms and additional  guidance. Performed at [facility], [address], [place], [place] [address]   HIV Antibody (routine testing w rflx)     Status: None   Collection Time: 12/21/21  1:40 AM  Result Value Ref Range   HIV Screen 4th Generation wRfx Non Reactive Non Reactive    Comment: Performed at [facility], [address], [place], [place] [address]  CBC with Differential/Platelet     Status: None   Collection Time: 12/21/21  1:40 AM  Result Value Ref Range   WBC 5.7 4.0 - 10.5 K/uL   RBC 4.87 3.87 - 5.11 MIL/uL   Hemoglobin 13.0 12.0 - 15.0 g/dL   HCT 01.0 93.2 - 35.5 %   MCV 84.2 80.0 - 100.0 fL   MCH 26.7 26.0 - 34.0 pg   MCHC 31.7 30.0 - 36.0 g/dL   RDW 73.2 20.2 - 54.2 %   Platelets 293 150 - 400 K/uL   nRBC 0.0 0.0 - 0.2 %   Neutrophils Relative % 54 %   Neutro Abs 3.1 1.7 - 7.7 K/uL   Lymphocytes Relative 35 %   Lymphs Abs 2.0 0.7 - 4.0 K/uL   Monocytes Relative 9 %   Monocytes Absolute 0.5 0.1 - 1.0 K/uL   Eosinophils Relative 1 %   Eosinophils Absolute 0.1 0.0 - 0.5 K/uL   Basophils Relative 1 %   Basophils Absolute 0.0 0.0 - 0.1 K/uL   Immature Granulocytes 0 %   Abs Immature Granulocytes 0.02 0.00 - 0.07 K/uL    Comment: Performed at [facility], [address] [place], [place] [address]  Comprehensive metabolic panel     Status: Abnormal   Collection Time: 12/21/21  1:40 AM  Result Value Ref Range   Sodium 142 135 - 145 mmol/L   Potassium 3.0 (L) 3.5 - 5.1 mmol/L   Chloride 109 98 - 111 mmol/L   CO2 24 22 - 32 mmol/L   Glucose, Bld 133 (H) 70 - 99 mg/dL    Comment: Glucose reference range applies only to samples taken  after fasting for at least 8 hours.   BUN 13 8 - 23 mg/dL   Creatinine, Ser 7.62 0.44 - 1.00 mg/dL   Calcium 9.2 8.9 - 83.1 mg/dL   Total Protein 6.7 6.5 - 8.1 g/dL   Albumin 3.4 (L) 3.5 - 5.0 g/dL   AST 34 15 - 41 U/L   ALT 46 (H) 0 - 44 U/L   Alkaline Phosphatase 79 38 - 126 U/L   Total Bilirubin 0.7 0.3 - 1.2 mg/dL   GFR, Estimated >51 >76 mL/min    Comment: (NOTE) Calculated using the CKD-EPI Creatinine Equation (2021)    Anion gap 9 5 - 15    Comment: Performed at [facility], [address], [place], [place] [address]  Magnesium     Status: Abnormal   Collection Time: 12/21/21  1:40 AM  Result Value Ref Range   Magnesium 1.5 (L) 1.7 - 2.4 mg/dL    Comment: Performed at [facility]  Lab, [address], [place], [place] [address]  Phosphorus     Status: None   Collection Time: 12/21/21  1:40 AM  Result Value Ref Range   Phosphorus 3.7 2.5 - 4.6 mg/dL    Comment: Performed at [facility], [address], [place], [place] [address]   DG Chest 2 View  Result Date: 12/20/2021 CLINICAL DATA:  Chest pain, shortness of breath EXAM: CHEST - 2 VIEW COMPARISON:  03/08/2018 FINDINGS: Transverse diameter of heart is increased. Central pulmonary vessels are more prominent. There is subtle increase in interstitial markings in parahilar regions and lower lung fields. Small bilateral pleural effusions are seen, more so on the right side. There is no pneumothorax. IMPRESSION: Cardiomegaly. Central pulmonary vessels are more prominent suggesting CHF. Increased interstitial markings in both lungs suggest pulmonary edema. Small bilateral pleural effusions. Electronically Signed   By: Ernie Avena M.D.   On: 12/20/2021 13:41    Pending Labs Unresulted Labs (From admission, onward)    None       Vitals/Pain Today's Vitals   12/21/21 0530 12/21/21 0600 12/21/21 0700 12/21/21 0820  BP: (!) 125/95 123/76 (!) 127/93   Pulse: 85 77 92   Resp: 16 14 17    Temp:    98 F  (36.7 C)  TempSrc:    Oral  SpO2: 97% 98% 96%   PainSc:        Isolation Precautions No active isolations  Medications Medications  sacubitril-valsartan (ENTRESTO) 97-103 mg per tablet (1 tablet Oral Given 12/20/21 2357)  apixaban (ELIQUIS) tablet 5 mg (5 mg Oral Given 12/20/21 2357)  methimazole (TAPAZOLE) tablet 5 mg (has no administration in time range)  metoprolol tartrate (LOPRESSOR) tablet 25 mg (25 mg Oral Given 12/20/21 2357)  furosemide (LASIX) injection 40 mg (40 mg Intravenous Given 12/20/21 2357)  potassium chloride SA (KLOR-CON M) CR tablet 40 mEq (40 mEq Oral Given 12/20/21 2357)  acetaminophen (TYLENOL) tablet 650 mg (has no administration in time range)  prochlorperazine (COMPAZINE) injection 5 mg (has no administration in time range)  melatonin tablet 5 mg (has no administration in time range)  polyethylene glycol (MIRALAX / GLYCOLAX) packet 17 g (has no administration in time range)  furosemide (LASIX) injection 40 mg (40 mg Intravenous Given 12/20/21 1937)  metoprolol tartrate (LOPRESSOR) injection 10 mg (10 mg Intravenous Given 12/20/21 1938)  potassium chloride SA (KLOR-CON M) CR tablet 40 mEq (40 mEq Oral Given 12/20/21 1937)  magnesium sulfate IVPB 2 g 50 mL (0 g Intravenous Stopped 12/21/21 0534)    Mobility walks Low fall risk   Focused Assessments Cardiac Assessment Handoff:    Lab Results  Component Value Date   TROPONINI 0.06 (HH) 03/08/2018   No results found for: "DDIMER" Does the Patient currently have chest pain? No    R Recommendations: See Admitting Provider Note  Report given to:   Additional Notes:

## 2021-12-22 ENCOUNTER — Other Ambulatory Visit (HOSPITAL_COMMUNITY): Payer: Self-pay

## 2021-12-22 ENCOUNTER — Encounter (HOSPITAL_COMMUNITY): Payer: Self-pay | Admitting: Internal Medicine

## 2021-12-22 DIAGNOSIS — T462X5A Adverse effect of other antidysrhythmic drugs, initial encounter: Secondary | ICD-10-CM

## 2021-12-22 DIAGNOSIS — I5033 Acute on chronic diastolic (congestive) heart failure: Secondary | ICD-10-CM | POA: Diagnosis not present

## 2021-12-22 DIAGNOSIS — E064 Drug-induced thyroiditis: Secondary | ICD-10-CM | POA: Diagnosis not present

## 2021-12-22 DIAGNOSIS — I4891 Unspecified atrial fibrillation: Secondary | ICD-10-CM | POA: Diagnosis not present

## 2021-12-22 DIAGNOSIS — J449 Chronic obstructive pulmonary disease, unspecified: Secondary | ICD-10-CM | POA: Diagnosis not present

## 2021-12-22 LAB — MAGNESIUM: Magnesium: 2.2 mg/dL (ref 1.7–2.4)

## 2021-12-22 LAB — BASIC METABOLIC PANEL
Anion gap: 11 (ref 5–15)
BUN: 14 mg/dL (ref 8–23)
CO2: 24 mmol/L (ref 22–32)
Calcium: 9.2 mg/dL (ref 8.9–10.3)
Chloride: 106 mmol/L (ref 98–111)
Creatinine, Ser: 0.83 mg/dL (ref 0.44–1.00)
GFR, Estimated: >60 mL/min (ref >60)
Glucose, Bld: 132 mg/dL — ABNORMAL HIGH (ref 70–99)
Potassium: 3.9 mmol/L (ref 3.5–5.1)
Sodium: 141 mmol/L (ref 135–145)

## 2021-12-22 MED ORDER — DIGOXIN 0.25 MG/ML IJ SOLN
0.2500 mg | Freq: Four times a day (QID) | INTRAMUSCULAR | Status: AC
  Administered 2021-12-22 (×2): 0.25 mg via INTRAVENOUS
  Filled 2021-12-22 (×2): qty 2

## 2021-12-22 MED ORDER — FUROSEMIDE 10 MG/ML IJ SOLN
40.0000 mg | Freq: Every day | INTRAMUSCULAR | Status: DC
  Administered 2021-12-22: 40 mg via INTRAVENOUS

## 2021-12-22 MED ORDER — DIGOXIN 125 MCG PO TABS
0.1250 mg | ORAL_TABLET | Freq: Every day | ORAL | Status: DC
  Administered 2021-12-23 – 2021-12-25 (×3): 0.125 mg via ORAL
  Filled 2021-12-22 (×3): qty 1

## 2021-12-22 NOTE — TOC Benefit Eligibility Note (Signed)
Patient Advocate Encounter  Insurance verification completed.    The patient is currently admitted and upon discharge could be taking Farxiga 10 mg.  The current 30 day co-pay is $0.00.   The patient is currently admitted and upon discharge could be taking Jardiance 10 mg.  The current 30 day co-pay is $0.00.   The patient is insured through [employer] Medicare Part D     Brooklen Runquist, CPhT Pharmacy Patient Advocate Specialist [facility] Patient Advocate Team Direct Number: [phone]  Fax: [phone]       

## 2021-12-22 NOTE — Progress Notes (Signed)
  Progress Note   Patient: Joanna [name] SAY:[id number] DOB: Feb 15, 1960 DOA: 12/20/2021     2 DOS: the patient was seen and examined on 12/22/2021   Brief hospital course: Mrs. Easom was admitted to the hospital with the working diagnosis of decompensated heart failure.   61 yo female with the past medical history of COPD, heart failure, atrial fibrillation and hypothyroid who presented with dyspnea. Positive symptoms on exertion, associated with orthopnea. On her initial physical examination her blood pressure was 138/115, HR 97, RR 17 and 02 saturation 98%, lungs with rales bilaterally, heart with S1 and S2 present irregularly irregular, abdomen with no distention, positive lower extremity edema.   Na 144, K 3,1 Cl 111, bicarbonate 22, glucose 118 bun 13 cr 0,84  BNP 1,481  Wbc 6,3 hgb 13.1 plt 315  Sars covid 19 negative  TSH <0.010  Chest radiograph with cardiomegaly, bilateral hilar vascular congestion with bilateral interstitial infiltrates, right pleural effusion.   EKG 126 bpm, normal axis, qtc 506, atrial fibrillation rhythm, with no significant ST segment or T wave changes.   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   Assessment and Plan: * Acute on chronic diastolic CHF (congestive heart failure) (HCC) Echocardiogram with reduced LV systolic function 25 to 30%, with global hypokinesis, RV systolic function low normal. LA with severe dilatation, mild to moderate TR, severe MR, RVSP 47.5 mmHg. Trivial pericardial effusion.   Urine output is 2,500 Systolic blood pressure is 117 to 130 mmHg.  Diuresis with furosemide 40 mg IV q12 hrs  Carvedilol, dapagliflozin, entresto and spironolactone.  On entresto   Acute cardiogenic pulmonary edema with right pleural effusion.  Continue diuresis with furosemide.  Oxymetry is 99% on room air.   Atrial fibrillation with RVR (HCC) Continue atrial  fibrillation with RVR 100 to 130 bpm, worse tachycardia with movement.  Added digoxin   Continue anticoagulation with apixaban.   Plan to continue with carvedilol for rate control.   Amiodarone-induced thyroiditis Patient has been on methimazole with persistent hyperthyroidism. Plan to add prednisone, possible type 2 amiodarone induced thyrotoxicosis.  Because drug drug interaction between methimazole and digoxin, will hod on methimazole for now.   COPD (chronic obstructive pulmonary disease) (HCC) No clinical signs of exacerbation   Essential hypertension Continue carvedilol, entresto for blood pressure control, diuretic therapy with spironolactone and SGLT 2 inh.         Subjective: Patient with dyspnea and palpitations with movement, no chest pain   Physical Exam: Vitals:   12/22/21 0500 12/22/21 0600 12/22/21 0737 12/22/21 1130  BP:   121/82 107/77  Pulse:   82 74  Resp: 16 17 12 20   Temp:   97.9 F (36.6 C) (!) 97.5 F (36.4 C)  TempSrc:   Oral Oral  SpO2:   96% 99%  Weight:      Height:       Neurology awake and alert ENT with no pallor Cardiovascular with S1 and S2 present and tachycardic, irregularly irregular with no gallops, rubs or murmurs Respiratory with no rales or wheezing Abdomen with no distention  No lower extremity edema  Data Reviewed:    Family Communication: no family at the bedside   Disposition: Status is: Inpatient Remains inpatient appropriate because: atrial fibrillation   Planned Discharge Destination: Home   Author: , MD 12/22/2021 4:05 PM  For on call review [email or web address].

## 2021-12-22 NOTE — Assessment & Plan Note (Signed)
Type 2 amiodarone induced thyrotoxicosis.   Patient has been on methimazole as outpatient with persistent thyrotoxicosis.  Amiodarone has been held on 10/2021.   Considering failure of monotherapy and severe cardiovascular complication of atrial fibrillation with RVR, prednisone was added with good toleration.   Plan to continue with 40 mg prednisone for at least 21 days and then slow taper over the next 3 to 4 months depending on patient's clinical response. Follow up thyroid function testing in 3 weeks.

## 2021-12-22 NOTE — Care Management Important Message (Signed)
Important Message  Patient Details  Name: Joanna [name] MRN: [id number] Date of Birth: 1960-12-07   Medicare Important Message Given:  Yes     Renie Ora 12/22/2021, 10:01 AM

## 2021-12-22 NOTE — Progress Notes (Signed)
Heart Failure Nurse Navigator Progress Note  PCP: Marcine Matar, MD PCP-Cardiologist: Nahser Admission Diagnosis: Acute on chronic combined systolic and diastolic congestive heart failure.  Admitted from: Home via EMS  Presentation:   Joanna [name] presented with increased shortness of breath, chest pain with exertion, and bilateral edema., palpitations, Medications Aminodarone (changed recently at her cardiologist appointment, due to concerns it was affecting her thyroid. ) BP 133/85, HR 98, BNP 1,481, EKG showed Atrial Fibrillation with rapid ventricular response. IV lasix, Lopressor and PO Entresto and potassium given. 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   Patient was very interactive in heart failure education of the sign and symptoms, daily weights, when to call her doctor or go to the ED, her diet/ fluid restrictions, taking all medications as prescribed, attending all medical appointments. Patient asked a number of questions thru out interview. Patient voiced her understanding. A Hf TOC appointment was scheduled for 01/03/22 @ 2 pm.   ECHO/ LVEF: 25-30%   Clinical Course:  Past Medical History:  Diagnosis Date   Atrial fibrillation with RVR (HCC)    Chronic combined systolic (congestive) and diastolic (congestive) heart failure (HCC)    Dyspnea    Goiter    Hypertension    NICM (nonischemic cardiomyopathy) (HCC)    a. 08/2005 Echo: EF 30-35%, mod diff HK. Mild to Mod MR. Mildly dil LA; b. 08/2005 Cath: Nl Cors. Elevated CO w/o shunt; c. 09/2007 Echo: EF 45%. Mild to mod MR; c. 03/2015 Echo: EF 45%, global HK. Gr1 DD. Mild MR. Mildly dil LA.     Social History   Socioeconomic History   Marital status: Single    Spouse name: Not on file   Number of children: 1   Years of education: Not on file   Highest education level: High school graduate  Occupational History   Occupation: unemployed    Comment: on disablity  Tobacco Use   Smoking status:  Former    Packs/day: 2.00    Years: 30.00    Total pack years: 60.00    Types: Cigarettes    Quit date: 02/08/2018    Years since quitting: 3.8   Smokeless tobacco: Never  Vaping Use   Vaping Use: Never used  Substance and Sexual Activity   Alcohol use: No    Comment: quit 2 years ago   Drug use: No   Sexual activity: Not on file  Other Topics Concern   Not on file  Social History Narrative   Not on file   Social Determinants of Health   Financial Resource Strain: Low Risk  (12/22/2021)   Overall Financial Resource Strain (CARDIA)    Difficulty of Paying Living Expenses: Not very hard  Food Insecurity: No Food Insecurity (12/21/2021)   Hunger Vital Sign    Worried About Running Out of Food in the Last Year: Never true    Ran Out of Food in the Last Year: Never true  Transportation Needs: No Transportation Needs (12/22/2021)   PRAPARE - Administrator, Civil Service (Medical): No    Lack of Transportation (Non-Medical): No  Physical Activity: Not on file  Stress: Not on file  Social Connections: Not on file   Education Assessment and Provision:  Detailed education and instructions provided on heart failure disease management including the following:  Signs and symptoms of Heart Failure When to call the physician Importance of daily weights Low sodium diet Fluid restriction Medication management Anticipated future follow-up appointments  Patient  education given on each of the above topics.  Patient acknowledges understanding via teach back method and acceptance of all instructions.  Education Materials:  "Living Better With Heart Failure" Booklet, HF zone tool, & Daily Weight Tracker Tool.  Patient has scale at home: yes Patient has pill box at home: yes    High Risk Criteria for Readmission and/or Poor Patient Outcomes: Heart failure hospital admissions (last 6 months): 1  No Show rate: 7 % Difficult social situation: No Demonstrates medication  adherence: Yes Primary Language: English Literacy level: Reading, writing, and comprehension  Barriers of Care:   Diet/ fluid restrictions ( Soda)  Daily weights Continued HF Education  Considerations/Referrals:   Referral made to Heart Failure Pharmacist Stewardship: Yes Referral made to Heart Failure CSW/NCM TOC: No Referral made to Heart & Vascular TOC clinic: Yes, 01/03/22 @ 2 pm.   Items for Follow-up on DC/TOC: Diet/ fluid restrictions (soda) Daily weights Continued HF Education   Rhae Hammock, BSN, RN Heart Failure Teacher, adult education Only

## 2021-12-22 NOTE — Plan of Care (Signed)
  Problem: Education: Goal: Knowledge of General Education information will improve Description Including pain rating scale, medication(s)/side effects and non-pharmacologic comfort measures Outcome: Progressing   

## 2021-12-22 NOTE — Progress Notes (Addendum)
   Heart Failure Stewardship Pharmacist Progress Note   PCP: Marcine Matar, MD PCP-Cardiologist: Kristeen Miss, MD    HPI:  74 YOF with a history of combined systolic and diastolic HF, paroxysmal AF on Eliquis, HTN, COPD presenting with increased SOB, exertional chest pain and pedal edema x 3 days and orthopnea. Reports she has been gaining weight for last 3 weeks but that she was compliant with her lasix. Used to be on amiodarone for AF but stopped due to thyroid concerns, now on methimazole for hyperthyroidism. CXR showed small bilateral pleural effusions, pulmonary edema. ECHO done yesterday LVEF of 25 to 30% down from 55 to 60% on 8/22. Also demonstrated global hypokinesis, moderately to severely dilated cavity and mild left ventricular hypertrophy. Left atrium also severely dilated.   Current HF Medications: Diuretic: furosemide 40 mg IV daily Beta Blocker: carvedilol 12.5 mg BID ACE/ARB/ARNI: Entresto 97/103 mg 1 tab BID MRA: spironolactone 12.5 mg daily SGLT2i: Farxiga 10 mg daily Other: digoxin 0.25 mg IV then 0.125 mg PO daily  Prior to admission HF Medications: Diuretic: furosemide 20 mg daily Beta blocker: metoprolol tartrate 50 mg BID ACE/ARB/ARNI: Entresto 97/103 mg  1 tab BID  Pertinent Lab Values: Serum creatinine 0.77, BUN 13, Potassium 3.0, Sodium 142, BNP 1,481.1, Magnesium 1.5, A1c 5.4 (03/08/18)  Vital Signs: Weight: 187 lbs (admission weight: 190 lbs) Blood pressure: 110-130/70-90  Heart rate: 80-100s  I/O: -3L yesterday; net -5.5L  Medication Assistance / Insurance Benefits Check: Does the patient have prescription insurance?  Yes Type of insurance plan: UHC Medicare and Medicaid    Outpatient Pharmacy:  Prior to admission outpatient pharmacy: [facility] mail order Is the patient willing to use [facility] TOC pharmacy at discharge? Yes Is the patient willing to transition their outpatient pharmacy to utilize a [facility] outpatient pharmacy?   No    Assessment: 1. Acute on chronic systolic and diastolic CHF (LVEF 25 to 30%), due to tachycardia/AF with RVR. NYHA class IV symptoms. - Pt is currently all of 4 pillars of HF therapy with carvedilol 12.5 BID, Farxiga 10 daily, Entresto 97/103 1 tab BID and spironolactone 12.5 daily. Most recent BP has been 112-122/78-96 with HR 82-114. Pt still has room for titration of HF meds. Can consider titration of spironolactone to 25 mg daily considering hypokalemia. With recent potassium repletion, addition of digoxin and modest BPs, will hold off for now.   Plan: 1) Medication changes recommended at this time: -Check digoxin level tomorrow with AM labs and again in 5 days -Recheck magnesium -Consider titration of spironolactone to 25 mg daily tomorrow  2) Patient assistance: -Farxiga and Jardiance both $0 copays -Entresto   3)  Education  - Patient has been educated on current HF medications and potential additions to HF medication regimen - Patient verbalizes understanding that over the next few months, these medication doses may change and more medications may be added to optimize HF regimen - Patient has been educated on basic disease state pathophysiology and goals of therapy   Sherald Hess, PharmD Candidate 12/22/2021 8:55 AM  Irish Elders, PharmD PGY-1 [facility] Resident

## 2021-12-22 NOTE — Progress Notes (Signed)
Rounding Note    Patient Name: Joanna [name] Date of Encounter: 12/22/2021  [facility] Cardiologist: Kristeen Miss, MD   Subjective   No CP; dyspnea much improved  Inpatient Medications    Scheduled Meds:  apixaban  5 mg Oral BID   carvedilol  12.5 mg Oral BID WC   dapagliflozin propanediol  10 mg Oral Daily   furosemide  40 mg Intravenous BID   methimazole  5 mg Oral Daily   potassium chloride  40 mEq Oral BID   predniSONE  40 mg Oral Q breakfast   sacubitril-valsartan  1 tablet Oral BID   spironolactone  12.5 mg Oral Daily   Continuous Infusions:  PRN Meds: acetaminophen, melatonin, polyethylene glycol, prochlorperazine   Vital Signs    Vitals:   12/22/21 0433 12/22/21 0500 12/22/21 0600 12/22/21 0737  BP: 122/78   121/82  Pulse: (!) 105   82  Resp: 20 16 17 12   Temp: 99 F (37.2 C)   97.9 F (36.6 C)  TempSrc:    Oral  SpO2: 99%   96%  Weight: 84.9 kg     Height:        Intake/Output Summary (Last 24 hours) at 12/22/2021 0846 Last data filed at 12/22/2021 0024 Gross per 24 hour  Intake 833.54 ml  Output 3850 ml  Net -3016.46 ml      12/22/2021    4:33 AM 12/21/2021   11:16 AM 10/12/2021    9:12 AM  Last 3 Weights  Weight (lbs) 187 lb 3.2 oz 190 lb 4.8 oz 196 lb 6.4 oz  Weight (kg) 84.913 kg 86.32 kg 89.086 kg      Telemetry    Atrial fibrillation rate increased to 160 with movements- Personally Reviewed  Physical Exam   GEN: No acute distress.   Neck: No JVD Cardiac: irregular, tachycardic Respiratory: Clear to auscultation bilaterally. GI: Soft, nontender, non-distended  MS: No edema Neuro:  Nonfocal  Psych: Normal affect   Labs    High Sensitivity Troponin:   Recent Labs  Lab 12/20/21 2005 12/20/21 2138  TROPONINIHS 32* 28*     Chemistry Recent Labs  Lab 12/20/21 1313 12/20/21 2005 12/21/21 0140  NA 144  --  142  K 3.1*  --  3.0*  CL 111  --  109  CO2 22  --  24  GLUCOSE 118*  --  133*  BUN 13  --   13  CREATININE 0.84  --  0.77  CALCIUM 9.1  --  9.2  MG  --  1.6* 1.5*  PROT  --  7.0 6.7  ALBUMIN  --  3.4* 3.4*  AST  --  37 34  ALT  --  47* 46*  ALKPHOS  --  73 79  BILITOT  --  0.8 0.7  GFRNONAA >60  --  >60  ANIONGAP 11  --  9    Hematology Recent Labs  Lab 12/20/21 1313 12/21/21 0140  WBC 6.3 5.7  RBC 4.78 4.87  HGB 13.1 13.0  HCT 41.2 41.0  MCV 86.2 84.2  MCH 27.4 26.7  MCHC 31.8 31.7  RDW 13.9 14.0  PLT 315 293   Thyroid  Recent Labs  Lab 12/20/21 2000 12/20/21 2005  TSH <0.010*  --   FREET4  --  1.97*    BNP Recent Labs  Lab 12/20/21 1313  BNP 1,481.1*     Radiology    ECHOCARDIOGRAM COMPLETE  Result Date: 12/21/2021  ECHOCARDIOGRAM REPORT   Patient Name:   Joanna [name] Date of Exam: 12/21/2021 Medical Rec #:  [id number]       Height:       72.0 in Accession #:    [id number]      Weight:       196.4 lb Date of Birth:  1960-08-21       BSA:          2.114 m Patient Age:    61 years        BP:           142/107 mmHg Patient Gender: F               HR:           102 bpm. Exam Location:  Inpatient Procedure: 2D Echo, Cardiac Doppler and Color Doppler Indications:    CHF-acute diastolic  History:        Patient has prior history of Echocardiogram examinations, most                 recent 10/04/2020. CHF, COPD, Arrythmias:Atrial Fibrillation,                 Signs/Symptoms:Shortness of Breath; Risk Factors:Hypertension                 and Current Smoker. Non-ischemic cardiomyopathy.  Sonographer:    Ross Ludwig RDCS (AE) Referring Phys: [id number] Darlin Drop  Sonographer Comments: Patient in Fowler's position due to orthopnea. IMPRESSIONS  1. No significant change from echo done in 2020.  2. Left ventricular ejection fraction, by estimation, is 25 to 30%. The left ventricle has severely decreased function. The left ventricle demonstrates global hypokinesis. The left ventricular internal cavity size was moderately to severely dilated. There is mild left  ventricular hypertrophy. Left ventricular diastolic parameters are indeterminate.  3. Right ventricular systolic function is low normal. The right ventricular size is normal. There is moderately elevated pulmonary artery systolic pressure.  4. Left atrial size was severely dilated.  5. MR is eccentric, posteriorly directed into LA . The mitral valve is abnormal. Severe mitral valve regurgitation.  6. Tricuspid valve regurgitation is mild to moderate.  7. The aortic valve is tricuspid. Aortic valve regurgitation is trivial. Aortic valve sclerosis is present, with no evidence of aortic valve stenosis.  8. The inferior vena cava is dilated in size with <50% respiratory variability, suggesting right atrial pressure of 15 mmHg. FINDINGS  Left Ventricle: Left ventricular ejection fraction, by estimation, is 25 to 30%. The left ventricle has severely decreased function. The left ventricle demonstrates global hypokinesis. The left ventricular internal cavity size was moderately to severely  dilated. There is mild left ventricular hypertrophy. Left ventricular diastolic parameters are indeterminate. Right Ventricle: The right ventricular size is normal. Right vetricular wall thickness was not assessed. Right ventricular systolic function is low normal. There is moderately elevated pulmonary artery systolic pressure. The tricuspid regurgitant velocity is 2.85 m/s, and with an assumed right atrial pressure of 15 mmHg, the estimated right ventricular systolic pressure is 47.5 mmHg. Left Atrium: Left atrial size was severely dilated. Right Atrium: Right atrial size was normal in size. Pericardium: Trivial pericardial effusion is present. Mitral Valve: MR is eccentric, posteriorly directed into LA. The mitral valve is abnormal. There is moderate thickening of the mitral valve leaflet(s). There is mild calcification of the mitral valve leaflet(s). Mild mitral annular calcification. Severe mitral valve regurgitation. Tricuspid  Valve: The tricuspid valve is normal  in structure. Tricuspid valve regurgitation is mild to moderate. Aortic Valve: The aortic valve is tricuspid. Aortic valve regurgitation is trivial. Aortic valve sclerosis is present, with no evidence of aortic valve stenosis. Aortic valve mean gradient measures 2.0 mmHg. Aortic valve peak gradient measures 4.0 mmHg. Aortic valve area, by VTI measures 3.27 cm. Pulmonic Valve: The pulmonic valve was normal in structure. Pulmonic valve regurgitation is mild. Aorta: The aortic root and ascending aorta are structurally normal, with no evidence of dilitation. Venous: The inferior vena cava is dilated in size with less than 50% respiratory variability, suggesting right atrial pressure of 15 mmHg. IAS/Shunts: No atrial level shunt detected by color flow Doppler.  LEFT VENTRICLE PLAX 2D LVIDd:         6.00 cm LVIDs:         5.10 cm LV PW:         1.30 cm LV IVS:        0.90 cm LVOT diam:     2.50 cm LV SV:         53 LV SV Index:   25 LVOT Area:     4.91 cm  LV Volumes (MOD) LV vol d, MOD A2C: 123.0 ml LV vol d, MOD A4C: 109.0 ml LV vol s, MOD A2C: 85.8 ml LV vol s, MOD A4C: 83.9 ml LV SV MOD A2C:     37.2 ml LV SV MOD A4C:     109.0 ml LV SV MOD BP:      31.3 ml RIGHT VENTRICLE          IVC RV Basal diam:  3.30 cm  IVC diam: 2.20 cm LEFT ATRIUM              Index        RIGHT ATRIUM           Index LA diam:        5.00 cm  2.36 cm/m   RA Area:     18.70 cm LA Vol (A2C):   165.0 ml 78.04 ml/m  RA Volume:   47.90 ml  22.66 ml/m LA Vol (A4C):   145.0 ml 68.58 ml/m LA Biplane Vol: 153.0 ml 72.37 ml/m  AORTIC VALVE AV Area (Vmax):    3.48 cm AV Area (Vmean):   3.31 cm AV Area (VTI):     3.27 cm AV Vmax:           99.50 cm/s AV Vmean:          67.400 cm/s AV VTI:            0.162 m AV Peak Grad:      4.0 mmHg AV Mean Grad:      2.0 mmHg LVOT Vmax:         70.60 cm/s LVOT Vmean:        45.400 cm/s LVOT VTI:          0.108 m LVOT/AV VTI ratio: 0.67  AORTA Ao Root diam: 3.20 cm Ao Asc  diam:  3.40 cm MR Peak grad: 98.0 mmHg   TRICUSPID VALVE MR Mean grad: 62.0 mmHg   TR Peak grad:   32.5 mmHg MR Vmax:      495.00 cm/s TR Vmax:        285.00 cm/s MR Vmean:     366.0 cm/s  SHUNTS                           Systemic VTI:  0.11 m                           Systemic Diam: 2.50 cm Dietrich Pates MD Electronically signed by Dietrich Pates MD Signature Date/Time: 12/21/2021/3:39:14 PM    Final    DG Chest 2 View  Result Date: 12/20/2021 CLINICAL DATA:  Chest pain, shortness of breath EXAM: CHEST - 2 VIEW COMPARISON:  03/08/2018 FINDINGS: Transverse diameter of heart is increased. Central pulmonary vessels are more prominent. There is subtle increase in interstitial markings in parahilar regions and lower lung fields. Small bilateral pleural effusions are seen, more so on the right side. There is no pneumothorax. IMPRESSION: Cardiomegaly. Central pulmonary vessels are more prominent suggesting CHF. Increased interstitial markings in both lungs suggest pulmonary edema. Small bilateral pleural effusions. Electronically Signed   By: Ernie Avena M.D.   On: 12/20/2021 13:41     Patient Profile     61 year old female with past medical history of nonischemic cardiomyopathy improved, mitral regurgitation, persistent atrial fibrillation, chronic combined systolic/diastolic congestive heart failure, hypertension, tobacco abuse for evaluation of atrial fibrillation and acute on chronic combined systolic/diastolic congestive heart failure.  Patient has a history of atrial fibrillation that recurred following cardioversion.  She was placed on amiodarone but developed hyperthyroidism.  Amiodarone was discontinued on October 16, 2021. Now admitted with CHF symptoms.  Assessment & Plan    1 atrial fibrillation with rapid ventricular response-as outlined previously elevated rate is likely being driven by hyperthyroidism.  Continue carvedilol at present dose.  Heart rate increases with  minimal movement.  Add digoxin.  Follow heart rate and adjust medications as needed.  Once her hyperthyroidism is treated we will plan to refer to atrial fibrillation clinic for consideration of Tikosyn versus ablation.  I do not think she will hold sinus rhythm at present in the setting of hyperthyroidism.     2 acute on chronic combined systolic/diastolic congestive heart failure-volume status is much improved.  Will continue Entresto, carvedilol, Farxiga and spironolactone.  We will also treat with Lasix 40 mg daily.  Continue to follow renal function.  LV function is again reduced on echocardiogram and I think this is likely tachycardia mediated.  We will need to reassess 3 months after sinus is reestablished or heart rate is controlled.     3 mitral regurgitation-severe on follow-up echocardiogram.  This will also need to be reassessed once heart rate is controlled or sinus reestablished.   4 hyperthyroidism-amiodarone has been discontinued since September 11.  Agree with methimazole.  Once hyperthyroidism is corrected will address atrial fibrillation.   5 hypertension-blood pressure controlled.  Continue present medications.   6 tobacco abuse-patient previously counseled on discontinuing.  For questions or updates, please contact [facility] Please consult [email or web address] for contact info under        Signed, Olga Millers, MD  12/22/2021, 8:46 AM

## 2021-12-23 DIAGNOSIS — J449 Chronic obstructive pulmonary disease, unspecified: Secondary | ICD-10-CM | POA: Diagnosis not present

## 2021-12-23 DIAGNOSIS — I4891 Unspecified atrial fibrillation: Secondary | ICD-10-CM | POA: Diagnosis not present

## 2021-12-23 DIAGNOSIS — I5033 Acute on chronic diastolic (congestive) heart failure: Secondary | ICD-10-CM | POA: Diagnosis not present

## 2021-12-23 DIAGNOSIS — E064 Drug-induced thyroiditis: Secondary | ICD-10-CM | POA: Diagnosis not present

## 2021-12-23 LAB — BASIC METABOLIC PANEL
Anion gap: 8 (ref 5–15)
BUN: 19 mg/dL (ref 8–23)
CO2: 27 mmol/L (ref 22–32)
Calcium: 9.1 mg/dL (ref 8.9–10.3)
Chloride: 105 mmol/L (ref 98–111)
Creatinine, Ser: 0.85 mg/dL (ref 0.44–1.00)
GFR, Estimated: >60 mL/min (ref >60)
Glucose, Bld: 127 mg/dL — ABNORMAL HIGH (ref 70–99)
Potassium: 3.8 mmol/L (ref 3.5–5.1)
Sodium: 140 mmol/L (ref 135–145)

## 2021-12-23 LAB — MAGNESIUM: Magnesium: 2 mg/dL (ref 1.7–2.4)

## 2021-12-23 MED ORDER — FUROSEMIDE 40 MG PO TABS
40.0000 mg | ORAL_TABLET | Freq: Every day | ORAL | Status: DC
  Administered 2021-12-23 – 2021-12-25 (×3): 40 mg via ORAL
  Filled 2021-12-23 (×3): qty 1

## 2021-12-23 MED ORDER — SACUBITRIL-VALSARTAN 49-51 MG PO TABS
1.0000 | ORAL_TABLET | Freq: Two times a day (BID) | ORAL | Status: DC
  Administered 2021-12-23 – 2021-12-24 (×3): 1 via ORAL
  Filled 2021-12-23 (×3): qty 1

## 2021-12-23 MED ORDER — CARVEDILOL 6.25 MG PO TABS
18.7500 mg | ORAL_TABLET | Freq: Two times a day (BID) | ORAL | Status: DC
  Administered 2021-12-23 – 2021-12-24 (×2): 18.75 mg via ORAL
  Filled 2021-12-23 (×2): qty 1

## 2021-12-23 MED ORDER — METHIMAZOLE 5 MG PO TABS
5.0000 mg | ORAL_TABLET | Freq: Every day | ORAL | Status: DC
  Administered 2021-12-24 – 2021-12-25 (×2): 5 mg via ORAL
  Filled 2021-12-23 (×3): qty 1

## 2021-12-23 NOTE — Progress Notes (Signed)
Progress Note   Patient: Joanna [name] YHC:[id number] DOB: 09/08/1960 DOA: 12/20/2021     3 DOS: the patient was seen and examined on 12/23/2021   Brief hospital course: Joanna [name] was admitted to the hospital with the working diagnosis of decompensated heart failure.   61 yo female with the past medical history of COPD, heart failure, atrial fibrillation and hypothyroid who presented with dyspnea. Positive symptoms on exertion, associated with orthopnea. On her initial physical examination her blood pressure was 138/115, HR 97, RR 17 and 02 saturation 98%, lungs with rales bilaterally, heart with S1 and S2 present irregularly irregular, abdomen with no distention, positive lower extremity edema.   Na 144, K 3,1 Cl 111, bicarbonate 22, glucose 118 bun 13 cr 0,84  BNP 1,481  Wbc 6,3 hgb 13.1 plt 315  Sars covid 19 negative  TSH <0.010  Chest radiograph with cardiomegaly, bilateral hilar vascular congestion with bilateral interstitial infiltrates, right pleural effusion.   EKG 126 bpm, normal axis, qtc 506, atrial fibrillation rhythm, with no significant ST segment or T wave changes.   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  11/18 improved rate control atrial fibrillation   Assessment and Plan: * Acute on chronic diastolic CHF (congestive heart failure) (HCC) Echocardiogram with reduced LV systolic function 25 to 30%, with global hypokinesis, RV systolic function low normal. LA with severe dilatation, mild to moderate TR, severe MR, RVSP 47.5 mmHg. Trivial pericardial effusion.   Urine output is 1,200 Systolic blood pressure is 99 to 110 mmHg.  Carvedilol, dapagliflozin, entresto and spironolactone.  On entresto  Transitioned to oral furosemide.   Acute cardiogenic pulmonary edema with right pleural effusion.  Continue diuresis with furosemide.  Oxymetry is 99% on room air.   Atrial  fibrillation with RVR (HCC) Patient continue in atrial fibrillation but rate has improved to down to th 80 at times.   Continue anticoagulation with apixaban.   Plan to continue with carvedilol and digoxin for rate control.  Thyrotoxicosis control with prednisone and methimazole.  Caution with digoxin interaction   Amiodarone-induced thyroiditis Patient has been on methimazole with persistent hyperthyroidism. 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    COPD (chronic obstructive pulmonary disease) (HCC) No clinical signs of exacerbation   Essential hypertension Continue carvedilol, entresto for blood pressure control, diuretic therapy with spironolactone and SGLT 2 inh.         Subjective: Patient is feeling better, dyspnea and edema continue to improve. She has back pain that is controlled with analgesics   Physical Exam: Vitals:   12/23/21 0030 12/23/21 0033 12/23/21 0515 12/23/21 0906  BP: 111/72 111/72 110/79 99/76  Pulse: 76 76 72 99  Resp: 16 16 18 17   Temp: 97.9 F (36.6 C) 97.9 F (36.6 C) 97.8 F (36.6 C) (!) 97.5 F (36.4 C)  TempSrc: Oral  Oral Oral  SpO2: 98%  95% 94%  Weight:   85 kg   Height:       Neurology awake and alert ENT with mild pallor Cardiovascular with S1 and S2 present, irregularly irregular with no gallops or rubs, no murmurs Respiratory with no rales or wheezing Abdomen with no distention No lower extremity edema  Data Reviewed:    Family Communication: no family at the bedside   Disposition: Status is: Inpatient Remains inpatient appropriate because: telemetry monitoring for atrial fibrillation with RVR   Planned Discharge Destination: Home  Author: Coralie Keens, MD 12/23/2021 1:12 PM  For on call review [email or web address].

## 2021-12-23 NOTE — Progress Notes (Signed)
   12/23/21 1000  Mobility  Activity Ambulated independently in room  Level of Assistance Independent  Assistive Device None  Distance Ambulated (ft) 6 ft  Activity Response Tolerated poorly  Mobility Referral Yes  $Mobility charge 1 Mobility   Mobility Specialist Progress Note  Pre-Mobility: 112HR During Mobility: 170HR Post-Mobility: 125 HR  Pt was in bed and agreeable. Mobility cut short d/t high HR. Returned to bed w/ all needs met and call bell in reach. RN notified.   Lucious Groves Mobility Specialist

## 2021-12-23 NOTE — Progress Notes (Signed)
Rounding Note    Patient Name: Joanna [name] Date of Encounter: 12/23/2021  [place] Cardiologist: Mertie Moores, MD   Subjective   Denies CP or dyspnea; complains of left back pain  Inpatient Medications    Scheduled Meds:  apixaban  5 mg Oral BID   carvedilol  12.5 mg Oral BID WC   dapagliflozin propanediol  10 mg Oral Daily   digoxin  0.125 mg Oral Daily   furosemide  40 mg Intravenous Daily   predniSONE  40 mg Oral Q breakfast   sacubitril-valsartan  1 tablet Oral BID   spironolactone  12.5 mg Oral Daily   Continuous Infusions:  PRN Meds: acetaminophen, melatonin, polyethylene glycol, prochlorperazine   Vital Signs    Vitals:   12/22/21 2020 12/23/21 0030 12/23/21 0033 12/23/21 0515  BP: 110/87 111/72 111/72 110/79  Pulse: (!) 101 76 76 72  Resp: 16 16 16 18   Temp: 97.8 F (36.6 C) 97.9 F (36.6 C) 97.9 F (36.6 C) 97.8 F (36.6 C)  TempSrc: Oral Oral  Oral  SpO2: 96% 98%  95%  Weight:    85 kg  Height:        Intake/Output Summary (Last 24 hours) at 12/23/2021 0816 Last data filed at 12/22/2021 2110 Gross per 24 hour  Intake 286 ml  Output 1200 ml  Net -914 ml       12/23/2021    5:15 AM 12/22/2021    4:33 AM 12/21/2021   11:16 AM  Last 3 Weights  Weight (lbs) 187 lb 6.4 oz 187 lb 3.2 oz 190 lb 4.8 oz  Weight (kg) 85.004 kg 84.913 kg 86.32 kg      Telemetry    Atrial fibrillation rate elevated with movement- Personally Reviewed  Physical Exam   GEN: No acute distress.   Neck: No JVD Cardiac: irregular, tachycardic Respiratory: Clear to auscultation bilaterally. GI: Soft, nontender, non-distended  MS: No edema Neuro:  Nonfocal  Psych: Normal affect   Labs    High Sensitivity Troponin:   Recent Labs  Lab 12/20/21 2005 12/20/21 2138  TROPONINIHS 32* 28*      Chemistry Recent Labs  Lab 12/20/21 2005 12/21/21 0140 12/22/21 0938 12/23/21 0052  NA  --  142 141 140  K  --  3.0* 3.9 3.8  CL  --  109 106  105  CO2  --  24 24 27   GLUCOSE  --  133* 132* 127*  BUN  --  13 14 19   CREATININE  --  0.77 0.83 0.85  CALCIUM  --  9.2 9.2 9.1  MG 1.6* 1.5* 2.2 2.0  PROT 7.0 6.7  --   --   ALBUMIN 3.4* 3.4*  --   --   AST 37 34  --   --   ALT 47* 46*  --   --   ALKPHOS 73 79  --   --   BILITOT 0.8 0.7  --   --   GFRNONAA  --  >60 >60 >60  ANIONGAP  --  9 11 8      Hematology Recent Labs  Lab 12/20/21 1313 12/21/21 0140  WBC 6.3 5.7  RBC 4.78 4.87  HGB 13.1 13.0  HCT 41.2 41.0  MCV 86.2 84.2  MCH 27.4 26.7  MCHC 31.8 31.7  RDW 13.9 14.0  PLT 315 293    Thyroid  Recent Labs  Lab 12/20/21 2000 12/20/21 2005  TSH <0.010*  --   FREET4  --  1.97*     BNP Recent Labs  Lab 12/20/21 1313  BNP 1,481.1*      Radiology    ECHOCARDIOGRAM COMPLETE  Result Date: 12/21/2021    ECHOCARDIOGRAM REPORT   Patient Name:   ANAGABRIELA YOU Date of Exam: 12/21/2021 Medical Rec #:  [id number]       Height:       72.0 in Accession #:    [id number]      Weight:       196.4 lb Date of Birth:  Apr 06, 1960       BSA:          2.114 m Patient Age:    61 years        BP:           142/107 mmHg Patient Gender: F               HR:           102 bpm. Exam Location:  Inpatient Procedure: 2D Echo, Cardiac Doppler and Color Doppler Indications:    CHF-acute diastolic  History:        Patient has prior history of Echocardiogram examinations, most                 recent 10/04/2020. CHF, COPD, Arrythmias:Atrial Fibrillation,                 Signs/Symptoms:Shortness of Breath; Risk Factors:Hypertension                 and Current Smoker. Non-ischemic cardiomyopathy.  Sonographer:    Clayton Lefort RDCS (AE) Referring Phys: [id number] Kayleen Memos  Sonographer Comments: Patient in Fowler's position due to orthopnea. IMPRESSIONS  1. No significant change from echo done in 2020.  2. Left ventricular ejection fraction, by estimation, is 25 to 30%. The left ventricle has severely decreased function. The left ventricle demonstrates  global hypokinesis. The left ventricular internal cavity size was moderately to severely dilated. There is mild left ventricular hypertrophy. Left ventricular diastolic parameters are indeterminate.  3. Right ventricular systolic function is low normal. The right ventricular size is normal. There is moderately elevated pulmonary artery systolic pressure.  4. Left atrial size was severely dilated.  5. MR is eccentric, posteriorly directed into LA . The mitral valve is abnormal. Severe mitral valve regurgitation.  6. Tricuspid valve regurgitation is mild to moderate.  7. The aortic valve is tricuspid. Aortic valve regurgitation is trivial. Aortic valve sclerosis is present, with no evidence of aortic valve stenosis.  8. The inferior vena cava is dilated in size with <50% respiratory variability, suggesting right atrial pressure of 15 mmHg. FINDINGS  Left Ventricle: Left ventricular ejection fraction, by estimation, is 25 to 30%. The left ventricle has severely decreased function. The left ventricle demonstrates global hypokinesis. The left ventricular internal cavity size was moderately to severely  dilated. There is mild left ventricular hypertrophy. Left ventricular diastolic parameters are indeterminate. Right Ventricle: The right ventricular size is normal. Right vetricular wall thickness was not assessed. Right ventricular systolic function is low normal. There is moderately elevated pulmonary artery systolic pressure. The tricuspid regurgitant velocity is 2.85 m/s, and with an assumed right atrial pressure of 15 mmHg, the estimated right ventricular systolic pressure is [id number] mmHg. Left Atrium: Left atrial size was severely dilated. Right Atrium: Right atrial size was normal in size. Pericardium: Trivial pericardial effusion is present. Mitral Valve: MR is eccentric, posteriorly directed into LA. The mitral valve is abnormal. There  is moderate thickening of the mitral valve leaflet(s). There is mild calcification  of the mitral valve leaflet(s). Mild mitral annular calcification. Severe mitral valve regurgitation. Tricuspid Valve: The tricuspid valve is normal in structure. Tricuspid valve regurgitation is mild to moderate. Aortic Valve: The aortic valve is tricuspid. Aortic valve regurgitation is trivial. Aortic valve sclerosis is present, with no evidence of aortic valve stenosis. Aortic valve mean gradient measures 2.0 mmHg. Aortic valve peak gradient measures 4.0 mmHg. Aortic valve area, by VTI measures 3.27 cm. Pulmonic Valve: The pulmonic valve was normal in structure. Pulmonic valve regurgitation is mild. Aorta: The aortic root and ascending aorta are structurally normal, with no evidence of dilitation. Venous: The inferior vena cava is dilated in size with less than 50% respiratory variability, suggesting right atrial pressure of 15 mmHg. IAS/Shunts: No atrial level shunt detected by color flow Doppler.  LEFT VENTRICLE PLAX 2D LVIDd:         6.00 cm LVIDs:         5.10 cm LV PW:         1.30 cm LV IVS:        0.90 cm LVOT diam:     2.50 cm LV SV:         53 LV SV Index:   25 LVOT Area:     4.91 cm  LV Volumes (MOD) LV vol d, MOD A2C: 123.0 ml LV vol d, MOD A4C: 109.0 ml LV vol s, MOD A2C: 85.8 ml LV vol s, MOD A4C: 83.9 ml LV SV MOD A2C:     37.2 ml LV SV MOD A4C:     109.0 ml LV SV MOD BP:      31.3 ml RIGHT VENTRICLE          IVC RV Basal diam:  3.30 cm  IVC diam: 2.20 cm LEFT ATRIUM              Index        RIGHT ATRIUM           Index LA diam:        5.00 cm  2.36 cm/m   RA Area:     18.70 cm LA Vol (A2C):   165.0 ml 78.04 ml/m  RA Volume:   47.90 ml  22.66 ml/m LA Vol (A4C):   145.0 ml 68.58 ml/m LA Biplane Vol: 153.0 ml 72.37 ml/m  AORTIC VALVE AV Area (Vmax):    3.48 cm AV Area (Vmean):   3.31 cm AV Area (VTI):     3.27 cm AV Vmax:           99.50 cm/s AV Vmean:          67.400 cm/s AV VTI:            0.162 m AV Peak Grad:      4.0 mmHg AV Mean Grad:      2.0 mmHg LVOT Vmax:         70.60 cm/s LVOT  Vmean:        45.400 cm/s LVOT VTI:          0.108 m LVOT/AV VTI ratio: 0.67  AORTA Ao Root diam: 3.20 cm Ao Asc diam:  3.40 cm MR Peak grad: 98.0 mmHg   TRICUSPID VALVE MR Mean grad: 62.0 mmHg   TR Peak grad:   32.5 mmHg MR Vmax:      495.00 cm/s TR Vmax:        285.00 cm/s MR  Vmean:     366.0 cm/s                           SHUNTS                           Systemic VTI:  0.11 m                           Systemic Diam: 2.50 cm Dietrich Pates MD Electronically signed by Dietrich Pates MD Signature Date/Time: 12/21/2021/3:39:14 PM    Final      Patient Profile     61 year old female with past medical history of nonischemic cardiomyopathy improved, mitral regurgitation, persistent atrial fibrillation, chronic combined systolic/diastolic congestive heart failure, hypertension, tobacco abuse for evaluation of atrial fibrillation and acute on chronic combined systolic/diastolic congestive heart failure.  Patient has a history of atrial fibrillation that recurred following cardioversion.  She was placed on amiodarone but developed hyperthyroidism.  Amiodarone was discontinued on October 16, 2021. Now admitted with CHF symptoms.  Assessment & Plan    1 atrial fibrillation with rapid ventricular response-as outlined previously elevated rate is likely being driven by hyperthyroidism.  Continue carvedilol (increase to 18.75 mg BID).  Continue digoxin.  Note amiodarone caused hypothyroidism and presently being treated with methimazole.  Once her hyperthyroidism is treated we will plan to refer to atrial fibrillation clinic for consideration of Tikosyn versus ablation.  I do not think she will hold sinus in the setting of active hyperthyroidism.     2 acute on chronic combined systolic/diastolic congestive heart failure-volume status has improved.  We will change Lasix to 40 mg daily.  We will continue Entresto but decrease to 49/51 twice daily to allow blood pressure for higher dose Coreg for rate control of atrial  fibrillation.  Continue carvedilol, Farxiga and spironolactone.  Cardiomyopathy is likely tachycardia mediated.  We will plan to reassess with follow-up echocardiogram 3 months after heart rate controlled or sinuses reestablished.  Vein   3 mitral regurgitation-severe on follow-up echocardiogram.  This will also need to be reassessed once heart rate is controlled or sinus reestablished.   4 hyperthyroidism-amiodarone was discontinued since September 11.  Continue methimazole.  Once hyperthyroidism is corrected will address atrial fibrillation.   5 hypertension-blood pressure controlled.  Plan as outlined above.   6 tobacco abuse-patient previously counseled on discontinuing.  For questions or updates, please contact [facility] Please consult [email or web address] for contact info under        Signed, Olga Millers, MD  12/23/2021, 8:16 AM

## 2021-12-24 DIAGNOSIS — E064 Drug-induced thyroiditis: Secondary | ICD-10-CM | POA: Diagnosis not present

## 2021-12-24 DIAGNOSIS — I4891 Unspecified atrial fibrillation: Secondary | ICD-10-CM | POA: Diagnosis not present

## 2021-12-24 DIAGNOSIS — I5033 Acute on chronic diastolic (congestive) heart failure: Secondary | ICD-10-CM | POA: Diagnosis not present

## 2021-12-24 DIAGNOSIS — J449 Chronic obstructive pulmonary disease, unspecified: Secondary | ICD-10-CM | POA: Diagnosis not present

## 2021-12-24 LAB — BASIC METABOLIC PANEL
Anion gap: 10 (ref 5–15)
BUN: 16 mg/dL (ref 8–23)
CO2: 24 mmol/L (ref 22–32)
Calcium: 8.7 mg/dL — ABNORMAL LOW (ref 8.9–10.3)
Chloride: 106 mmol/L (ref 98–111)
Creatinine, Ser: 0.76 mg/dL (ref 0.44–1.00)
GFR, Estimated: >60 mL/min (ref >60)
Glucose, Bld: 134 mg/dL — ABNORMAL HIGH (ref 70–99)
Potassium: 3.9 mmol/L (ref 3.5–5.1)
Sodium: 140 mmol/L (ref 135–145)

## 2021-12-24 MED ORDER — SACUBITRIL-VALSARTAN 24-26 MG PO TABS
1.0000 | ORAL_TABLET | Freq: Two times a day (BID) | ORAL | Status: DC
  Administered 2021-12-24 – 2021-12-25 (×2): 1 via ORAL
  Filled 2021-12-24: qty 1

## 2021-12-24 MED ORDER — CARVEDILOL 25 MG PO TABS
25.0000 mg | ORAL_TABLET | Freq: Two times a day (BID) | ORAL | Status: DC
  Administered 2021-12-24 – 2021-12-25 (×2): 25 mg via ORAL
  Filled 2021-12-24 (×2): qty 1

## 2021-12-24 MED ORDER — TRAMADOL HCL 50 MG PO TABS: 50.0000 mg | ORAL_TABLET | Freq: Two times a day (BID) | ORAL | Status: DC | PRN

## 2021-12-24 NOTE — Progress Notes (Signed)
Progress Note   Patient: Joanna [name] [id number] DOB: 08/26/1960 DOA: 12/20/2021     4 DOS: the patient was seen and examined on 12/24/2021   Brief hospital course: Joanna [name] was admitted to the hospital with the working diagnosis of decompensated heart failure.   61 yo female with the past medical history of COPD, heart failure, atrial fibrillation and hypothyroid who presented with dyspnea. Positive symptoms on exertion, associated with orthopnea. On her initial physical examination her blood pressure was 138/115, HR 97, RR 17 and 02 saturation 98%, lungs with rales bilaterally, heart with S1 and S2 present irregularly irregular, abdomen with no distention, positive lower extremity edema.   Na 144, K 3,1 Cl 111, bicarbonate 22, glucose 118 bun 13 cr 0,84  BNP 1,481  Wbc 6,3 hgb 13.1 plt 315  Sars covid 19 negative  TSH <0.010  Chest radiograph with cardiomegaly, bilateral hilar vascular congestion with bilateral interstitial infiltrates, right pleural effusion.   EKG 126 bpm, normal axis, qtc 506, atrial fibrillation rhythm, with no significant ST segment or T wave changes.   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  11/18 improved rate control atrial fibrillation  11/19 continue to improve heart rate, will need digoxin level on 11/22.   Assessment and Plan: * Acute on chronic diastolic CHF (congestive heart failure) (HCC) Echocardiogram with reduced LV systolic function 25 to 30%, with global hypokinesis, RV systolic function low normal. LA with severe dilatation, mild to moderate TR, severe MR, RVSP 47.5 mmHg. Trivial pericardial effusion.   Urine output is 1,600 Systolic blood pressure is 106 to 113 mmHg.  Carvedilol, dapagliflozin, entresto and spironolactone.  Transitioned to oral furosemide.   Acute cardiogenic pulmonary edema with right pleural effusion.  Continue diuresis  with furosemide.  Oxymetry is 99% on room air.   Atrial fibrillation with RVR (HCC) Patient continue in atrial fibrillation but rate has improved to down to 70s and 80s.  Continue anticoagulation with apixaban.   Plan to continue with carvedilol and digoxin for rate control.  Thyrotoxicosis control with prednisone and methimazole.  Caution with digoxin interaction, will need level on 12/27/21.  Amiodarone-induced thyroiditis Patient has been on methimazole with persistent hyperthyroidism. 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    COPD (chronic obstructive pulmonary disease) (HCC) No clinical signs of exacerbation   Essential hypertension Continue carvedilol, entresto for blood pressure control, diuretic therapy with spironolactone and SGLT 2 inh.         Subjective: Patient with no chest pain or dyspnea, palpitations have improved, but she had limited mobility, positive  back pain   Physical Exam: Vitals:   12/23/21 1654 12/23/21 1951 12/24/21 0438 12/24/21 0729  BP: 112/76 103/68 106/66 113/84  Pulse: (!) 110 88 (!) 115 86  Resp: 18 15 17 16   Temp: (!) 97.4 F (36.3 C) 97.6 F (36.4 C) 98 F (36.7 C) (!) 97.5 F (36.4 C)  TempSrc: Oral Oral Oral Oral  SpO2: 98% 97% 96% 94%  Weight:   85.4 kg   Height:       Neurology awake and alert ENT with no pallor Cardiovascular with S1 and S2 present, irregularly irregular with no gallops, rubs or murmurs Respiratory with no rales or wheezing Abdomen with no distention  No lower extremity edema  Data Reviewed:    Family Communication: no family a the bedside   Disposition: Status is: Inpatient Remains inpatient appropriate because: atrial  fibrillation and need for telemetry monitoring   Planned Discharge Destination: Home  Author: Coralie Keens, MD 12/24/2021 11:08 AM  For on call review [email or web address].

## 2021-12-24 NOTE — Plan of Care (Signed)
Pt complaining of constipation; PRN miralax given in prune juice. Ambulatory O2 performed; pt remained 98% oxygen on room air. Pt asked for PIV to be removed; Arrien MD ok with not reinserting IV.  Problem: Education: Goal: Knowledge of General Education information will improve Description: Including pain rating scale, medication(s)/side effects and non-pharmacologic comfort measures Outcome: Progressing   Problem: Health Behavior/Discharge Planning: Goal: Ability to manage health-related needs will improve Outcome: Progressing   Problem: Clinical Measurements: Goal: Ability to maintain clinical measurements within normal limits will improve Outcome: Progressing Goal: Will remain free from infection Outcome: Progressing Goal: Diagnostic test results will improve Outcome: Progressing Goal: Respiratory complications will improve Outcome: Progressing Goal: Cardiovascular complication will be avoided Outcome: Progressing   Problem: Activity: Goal: Risk for activity intolerance will decrease Outcome: Progressing   Problem: Nutrition: Goal: Adequate nutrition will be maintained Outcome: Progressing   Problem: Coping: Goal: Level of anxiety will decrease Outcome: Progressing   Problem: Elimination: Goal: Will not experience complications related to bowel motility Outcome: Progressing Goal: Will not experience complications related to urinary retention Outcome: Progressing   Problem: Pain Managment: Goal: General experience of comfort will improve Outcome: Progressing   Problem: Safety: Goal: Ability to remain free from injury will improve Outcome: Progressing   Problem: Skin Integrity: Goal: Risk for impaired skin integrity will decrease Outcome: Progressing   Problem: Education: Goal: Ability to demonstrate management of disease process will improve Outcome: Progressing Goal: Ability to verbalize understanding of medication therapies will improve Outcome:  Progressing Goal: Individualized Educational Video(s) Outcome: Progressing   Problem: Activity: Goal: Capacity to carry out activities will improve Outcome: Progressing   Problem: Cardiac: Goal: Ability to achieve and maintain adequate cardiopulmonary perfusion will improve Outcome: Progressing   Problem: Education: Goal: Knowledge of General Education information will improve Description: Including pain rating scale, medication(s)/side effects and non-pharmacologic comfort measures Outcome: Progressing   Problem: Health Behavior/Discharge Planning: Goal: Ability to manage health-related needs will improve Outcome: Progressing   Problem: Clinical Measurements: Goal: Ability to maintain clinical measurements within normal limits will improve Outcome: Progressing Goal: Will remain free from infection Outcome: Progressing Goal: Diagnostic test results will improve Outcome: Progressing Goal: Respiratory complications will improve Outcome: Progressing Goal: Cardiovascular complication will be avoided Outcome: Progressing   Problem: Activity: Goal: Risk for activity intolerance will decrease Outcome: Progressing   Problem: Nutrition: Goal: Adequate nutrition will be maintained Outcome: Progressing   Problem: Coping: Goal: Level of anxiety will decrease Outcome: Progressing   Problem: Elimination: Goal: Will not experience complications related to bowel motility Outcome: Progressing Goal: Will not experience complications related to urinary retention Outcome: Progressing   Problem: Pain Managment: Goal: General experience of comfort will improve Outcome: Progressing   Problem: Safety: Goal: Ability to remain free from injury will improve Outcome: Progressing   Problem: Skin Integrity: Goal: Risk for impaired skin integrity will decrease Outcome: Progressing

## 2021-12-24 NOTE — Progress Notes (Signed)
Rounding Note    Patient Name: Joanna [name] Date of Encounter: 12/24/2021  [facility] Cardiologist: Kristeen Miss, MD   Subjective   Pt denies CP or dyspnea  Inpatient Medications    Scheduled Meds:  apixaban  5 mg Oral BID   carvedilol  18.75 mg Oral BID WC   dapagliflozin propanediol  10 mg Oral Daily   digoxin  0.125 mg Oral Daily   furosemide  40 mg Oral Daily   methimazole  5 mg Oral Daily   predniSONE  40 mg Oral Q breakfast   sacubitril-valsartan  1 tablet Oral BID   spironolactone  12.5 mg Oral Daily   Continuous Infusions:  PRN Meds: acetaminophen, melatonin, polyethylene glycol, prochlorperazine   Vital Signs    Vitals:   12/23/21 1654 12/23/21 1951 12/24/21 0438 12/24/21 0729  BP: 112/76 103/68 106/66 113/84  Pulse: (!) 110 88 (!) 115 86  Resp: 18 15 17 16   Temp: (!) 97.4 F (36.3 C) 97.6 F (36.4 C) 98 F (36.7 C) (!) 97.5 F (36.4 C)  TempSrc: Oral Oral Oral Oral  SpO2: 98% 97% 96% 94%  Weight:   85.4 kg   Height:        Intake/Output Summary (Last 24 hours) at 12/24/2021 1023 Last data filed at 12/24/2021 0908 Gross per 24 hour  Intake 956 ml  Output 1400 ml  Net -444 ml       12/24/2021    4:38 AM 12/23/2021    5:15 AM 12/22/2021    4:33 AM  Last 3 Weights  Weight (lbs) 188 lb 4.8 oz 187 lb 6.4 oz 187 lb 3.2 oz  Weight (kg) 85.412 kg 85.004 kg 84.913 kg      Telemetry    Atrial fibrillation rate elevated with movement- Personally Reviewed  Physical Exam   GEN: NAD Neck: Supple Cardiac: irregular Respiratory: CTA GI: Soft, NT/ND MS: No edema Neuro:  Grossly intact Psych: Normal affect   Labs    High Sensitivity Troponin:   Recent Labs  Lab 12/20/21 2005 12/20/21 2138  TROPONINIHS 32* 28*      Chemistry Recent Labs  Lab 12/20/21 2005 12/21/21 0140 12/22/21 0938 12/23/21 0052 12/24/21 0048  NA  --  142 141 140 140  K  --  3.0* 3.9 3.8 3.9  CL  --  109 106 105 106  CO2  --  24 24 27 24    GLUCOSE  --  133* 132* 127* 134*  BUN  --  13 14 19 16   CREATININE  --  0.77 0.83 0.85 0.76  CALCIUM  --  9.2 9.2 9.1 8.7*  MG 1.6* 1.5* 2.2 2.0  --   PROT 7.0 6.7  --   --   --   ALBUMIN 3.4* 3.4*  --   --   --   AST 37 34  --   --   --   ALT 47* 46*  --   --   --   ALKPHOS 73 79  --   --   --   BILITOT 0.8 0.7  --   --   --   GFRNONAA  --  >60 >60 >60 >60  ANIONGAP  --  9 11 8 10      Hematology Recent Labs  Lab 12/20/21 1313 12/21/21 0140  WBC 6.3 5.7  RBC 4.78 4.87  HGB 13.1 13.0  HCT 41.2 41.0  MCV 86.2 84.2  MCH 27.4 26.7  MCHC 31.8 31.7  RDW 13.9 14.0  PLT 315 293    Thyroid  Recent Labs  Lab 12/20/21 2000 12/20/21 2005  TSH <0.010*  --   FREET4  --  1.97*     BNP Recent Labs  Lab 12/20/21 1313  BNP 1,481.1*      Radiology    No results found.   Patient Profile     61 year old female with past medical history of nonischemic cardiomyopathy improved, mitral regurgitation, persistent atrial fibrillation, chronic combined systolic/diastolic congestive heart failure, hypertension, tobacco abuse for evaluation of atrial fibrillation and acute on chronic combined systolic/diastolic congestive heart failure.  Patient has a history of atrial fibrillation that recurred following cardioversion.  She was placed on amiodarone but developed hyperthyroidism.  Amiodarone was discontinued on October 16, 2021. Now admitted with CHF symptoms.  Assessment & Plan    1 atrial fibrillation with rapid ventricular response-as outlined previously elevated rate is likely being driven by hyperthyroidism.  Continue carvedilol (will increase to 25 mg twice daily as heart rate remains elevated with ambulation).  Continue digoxin.  Note amiodarone caused hyperthyroidism and presently being treated with methimazole.  Once her hyperthyroidism is treated we will plan to refer to atrial fibrillation clinic for consideration of Tikosyn versus ablation.  I do not think she will hold  sinus in the setting of active hyperthyroidism.     2 acute on chronic combined systolic/diastolic congestive heart failure-volume status has improved.  Continue Lasix 40 mg daily and spironolactone.  We will continue Entresto but decrease to 24/26 twice daily as outlined.  Continue carvedilol, Farxiga.  Cardiomyopathy is likely tachycardia mediated.  We will plan to reassess with follow-up echocardiogram 3 months after heart rate controlled or sinus is reestablished.    3 mitral regurgitation-severe on follow-up echocardiogram.  This will also need to be reassessed once heart rate is controlled or sinus reestablished.   4 hyperthyroidism-amiodarone was discontinued since September 11.  Continue methimazole.  Once hyperthyroidism is corrected will address atrial fibrillation.   5 hypertension-blood pressure controlled.  Plan as outlined above.   6 tobacco abuse-patient previously counseled on discontinuing.  For questions or updates, please contact [place] Please consult [email or web address] for contact info under        Signed, Kirk Ruths, MD  12/24/2021, 10:23 AM

## 2021-12-24 NOTE — Evaluation (Signed)
Occupational Therapy Evaluation Patient Details Name: Joanna [name] MRN: [id number] DOB: May 19, 1960 Today's Date: 12/24/2021   History of Present Illness Mrs. Rosch was admitted to the hospital with the working diagnosis of decompensated heart failure.61 yo female with the past medical history of COPD, heart failure, atrial fibrillation and hypothyroid who presented with dyspnea.   Clinical Impression   Ms. Shirleyann Montero is a 61 year old woman who presents with above medical history. On evaluation she is independent with mobility and ADLs. She exhibited good strength with no complaints of weakness and reports she is ready to go home. Her HR increased to 140 with just MMT and 150 with standing therefore therapist deferred further activity. Patient reports ambulating in hall and in room independently and is asymptomatic despite HR. Patient asked therapist about how to get a hospital bed and a shower chair. Therapist questioned patient's need for a hospital bed but unsure if patient understood. Encouraged patient to reach out to insurance for shower chair. Patient has no OT needs at this time.        Recommendations for follow up therapy are one component of a multi-disciplinary discharge planning process, led by the attending physician.  Recommendations may be updated based on patient status, additional functional criteria and insurance authorization.   Follow Up Recommendations  No OT follow up     Assistance Recommended at Discharge None  Patient can return home with the following      Functional Status Assessment  Patient has not had a recent decline in their functional status  Equipment Recommendations  None recommended by OT    Recommendations for Other Services       Precautions / Restrictions Precautions Precaution Comments: monitor HR Restrictions Weight Bearing Restrictions: No      Mobility Bed Mobility Overal bed mobility: Modified Independent                   Transfers Overall transfer level: Independent                 General transfer comment: Independent to stand - has been independent with ambulation in room to bathroom. However - due to HR therapist stopped patient from continued activity. HR between 120-150 just with MMT and standing.      Balance Overall balance assessment: Mild deficits observed, not formally tested                                         ADL either performed or assessed with clinical judgement   ADL Overall ADL's : Independent                                       General ADL Comments: Patient has been independently ambulation to bathroom., demonstrates ability to don clothing without assist.     Vision Patient Visual Report: No change from baseline       Perception     Praxis      Pertinent Vitals/Pain Pain Assessment Pain Assessment: No/denies pain     Hand Dominance Right   Extremity/Trunk Assessment Upper Extremity Assessment Upper Extremity Assessment: Overall WFL for tasks assessed   Lower Extremity Assessment Lower Extremity Assessment: Overall WFL for tasks assessed   Cervical / Trunk Assessment Cervical / Trunk Assessment: Normal   Communication Communication Communication:  HOH   Cognition Arousal/Alertness: Awake/alert Behavior During Therapy: WFL for tasks assessed/performed Overall Cognitive Status: Within Functional Limits for tasks assessed                                       General Comments       Exercises     Shoulder Instructions      Home Living Family/patient expects to be discharged to:: Private residence Living Arrangements:  (brother) Available Help at Discharge: Family Type of Home: House Home Access: Level entry     Home Layout: One level     Bathroom Shower/Tub: Chief Strategy Officer: Standard     Home Equipment: Medical laboratory scientific officer - single point          Prior  Functioning/Environment Prior Level of Function : Independent/Modified Independent                        OT Problem List: Cardiopulmonary status limiting activity      OT Treatment/Interventions:      OT Goals(Current goals can be found in the care plan section) Acute Rehab OT Goals OT Goal Formulation: Patient unable to participate in goal setting  OT Frequency:      Co-evaluation              AM-PAC OT "6 Clicks" Daily Activity     Outcome Measure Help from another person eating meals?: None Help from another person taking care of personal grooming?: None Help from another person toileting, which includes using toliet, bedpan, or urinal?: None Help from another person bathing (including washing, rinsing, drying)?: None Help from another person to put on and taking off regular upper body clothing?: None Help from another person to put on and taking off regular lower body clothing?: None 6 Click Score: 24   End of Session    Activity Tolerance: Patient tolerated treatment well Patient left: in bed  OT Visit Diagnosis: Muscle weakness (generalized) (M62.81)                Time: 7564-3329 OT Time Calculation (min): 17 min Charges:  OT General Charges $OT Visit: 1 Visit OT Evaluation $OT Eval Low Complexity: 1 Low  Donnella Sham, OTR/L Acute Care Rehab Services  Office [phone]   Kelli Churn 12/24/2021, 3:59 PM

## 2021-12-25 ENCOUNTER — Other Ambulatory Visit (HOSPITAL_COMMUNITY): Payer: Self-pay

## 2021-12-25 DIAGNOSIS — J449 Chronic obstructive pulmonary disease, unspecified: Secondary | ICD-10-CM | POA: Diagnosis not present

## 2021-12-25 DIAGNOSIS — I4891 Unspecified atrial fibrillation: Secondary | ICD-10-CM | POA: Diagnosis not present

## 2021-12-25 DIAGNOSIS — I5033 Acute on chronic diastolic (congestive) heart failure: Secondary | ICD-10-CM | POA: Diagnosis not present

## 2021-12-25 DIAGNOSIS — E064 Drug-induced thyroiditis: Secondary | ICD-10-CM | POA: Diagnosis not present

## 2021-12-25 LAB — BASIC METABOLIC PANEL
Anion gap: 8 (ref 5–15)
BUN: 19 mg/dL (ref 8–23)
CO2: 24 mmol/L (ref 22–32)
Calcium: 8.6 mg/dL — ABNORMAL LOW (ref 8.9–10.3)
Chloride: 107 mmol/L (ref 98–111)
Creatinine, Ser: 0.84 mg/dL (ref 0.44–1.00)
GFR, Estimated: >60 mL/min (ref >60)
Glucose, Bld: 117 mg/dL — ABNORMAL HIGH (ref 70–99)
Potassium: 3.8 mmol/L (ref 3.5–5.1)
Sodium: 139 mmol/L (ref 135–145)

## 2021-12-25 MED ORDER — FUROSEMIDE 40 MG PO TABS
40.0000 mg | ORAL_TABLET | Freq: Every day | ORAL | 0 refills | Status: DC
  Filled 2021-12-25: qty 30, 30d supply, fill #0

## 2021-12-25 MED ORDER — PREDNISONE 20 MG PO TABS
40.0000 mg | ORAL_TABLET | Freq: Every day | ORAL | 0 refills | Status: DC
  Filled 2021-12-25: qty 42, 21d supply, fill #0

## 2021-12-25 MED ORDER — SPIRONOLACTONE 25 MG PO TABS
12.5000 mg | ORAL_TABLET | Freq: Every day | ORAL | 0 refills | Status: DC
  Filled 2021-12-25: qty 15, 30d supply, fill #0

## 2021-12-25 MED ORDER — DAPAGLIFLOZIN PROPANEDIOL 10 MG PO TABS
10.0000 mg | ORAL_TABLET | Freq: Every day | ORAL | 0 refills | Status: DC
  Filled 2021-12-25: qty 30, 30d supply, fill #0

## 2021-12-25 MED ORDER — DIGOXIN 125 MCG PO TABS
0.1250 mg | ORAL_TABLET | Freq: Every day | ORAL | 0 refills | Status: DC
  Filled 2021-12-25: qty 30, 30d supply, fill #0

## 2021-12-25 MED ORDER — CARVEDILOL 25 MG PO TABS
25.0000 mg | ORAL_TABLET | Freq: Two times a day (BID) | ORAL | 0 refills | Status: DC
  Filled 2021-12-25: qty 60, 30d supply, fill #0

## 2021-12-25 NOTE — TOC Transition Note (Signed)
Transition of Care [facility]) - CM/SW Discharge Note   Patient Details  Name: Joanna [name] MRN: [id number] Date of Birth: 10-17-1960  [facility] CM/SW Contact:  Leone Haven, RN Phone Number: 12/25/2021, 1:47 PM   Clinical Narrative:    Patient is for dc today, per MD patient will need hospital bed.  NCM offered choice, she has no preference,  NCM made referral to [facility] with [employer].  Patient states she has transport home.   Final next level of care: Home/Self Care Barriers to Discharge: No Barriers Identified   Patient Goals and CMS Choice Patient states their goals for this hospitalization and ongoing recovery are:: return home   Choice offered to / list presented to : NA  Discharge Placement                       Discharge Plan and Services                DME Arranged: Hospital bed DME Agency: [employer] Date DME Agency Contacted: 12/25/21 Time DME Agency Contacted: 1347 Representative spoke with at DME Agency: Vaughan Basta            Social Determinants of Health (SDOH) Interventions Housing Interventions: Intervention Not Indicated Transportation Interventions: Intervention Not Indicated Alcohol Usage Interventions: Intervention Not Indicated (Score <7) Financial Strain Interventions: Intervention Not Indicated   Readmission Risk Interventions     No data to display

## 2021-12-25 NOTE — Progress Notes (Signed)
Explained discharge instructions to patient. Reviewed follow up appointment and next medication administration times. Also reviewed education. Patient verbalized having an understanding for instructions given. All belongings are in the patient's possession to include TOC meds. Telemetry was removed. CCMD was notified. No other needs verbalized. Transported downstairs for discharge.

## 2021-12-25 NOTE — Discharge Summary (Signed)
Physician Discharge Summary   Patient: Joanna [name] MRN: [id number] DOB: [birth year]  Admit date:     12/20/2021  Discharge date: 12/25/21  Discharge Physician: York Ram Johntae Broxterman   PCP: Marcine Matar, MD   Recommendations at discharge:    Patient has been placed on prednisone for amiodarone induces thyrotoxicosis, continue with methimazole Continue with prednisone for 3 weeks and then slow taper over next 2 to 3 months, depending on clinical improvement. Follow up thyroid function test in 3 weeks. Patient has been placed on digoxin that can interact with methimazole, recommended to check digoxin level on 12/27/21.  Rate control with carvedilol 25 mg bid.   Discharge Diagnoses: Principal Problem:   Acute on chronic diastolic CHF (congestive heart failure) (HCC) Active Problems:   Atrial fibrillation with RVR (HCC)   Amiodarone-induced thyroiditis   COPD (chronic obstructive pulmonary disease) (HCC)   Essential hypertension   Acute on chronic combined systolic and diastolic congestive heart failure (HCC)  Resolved Problems:   * No resolved hospital problems. [facility] Course: Joanna [name] was admitted to the hospital with the working diagnosis of decompensated heart failure.   61 yo female with the past medical history of COPD, heart failure, atrial fibrillation and hypothyroid who presented with dyspnea. Positive symptoms on exertion, associated with orthopnea. On her initial physical examination her blood pressure was 138/115, HR 97, RR 17 and 02 saturation 98%, lungs with rales bilaterally, heart with S1 and S2 present irregularly irregular, abdomen with no distention, positive lower extremity edema.   Na 144, K 3,1 Cl 111, bicarbonate 22, glucose 118 bun 13 cr 0,84  BNP 1,481  Wbc 6,3 hgb 13.1 plt 315  Sars covid 19 negative  TSH <0.010  Chest radiograph with cardiomegaly, bilateral hilar vascular congestion with bilateral interstitial infiltrates, right  pleural effusion.   EKG 126 bpm, normal axis, qtc 506, atrial fibrillation rhythm, with no significant ST segment or T wave changes.   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  11/18 improved rate control atrial fibrillation  11/19 continue to improve heart rate, will need digoxin level on 11/22.   Assessment and Plan: * Acute on chronic diastolic CHF (congestive heart failure) (HCC) Echocardiogram with reduced LV systolic function 25 to 30%, with global hypokinesis, RV systolic function low normal. LA with severe dilatation, mild to moderate TR, severe MR, RVSP 47.5 mmHg. Trivial pericardial effusion.   Patient was placed on IV furosemide for diuresis, negative fluid balance was achieved, -7,062 ml, with significant improvement in her symptoms.   Plan to continue with carvedilol, dapagliflozin, entresto and spironolactone.  Loop diuretic with furosemide.   Acute cardiogenic pulmonary edema with right pleural effusion.  Continue diuresis with furosemide.  At the time of her discharge her oxymetry is 97 to 100% on room air.  Patient will need a hospital bed for head elevation 45 degrees.   Atrial fibrillation with RVR (HCC) Patient continue in atrial fibrillation but rate has improved to down to 70s and 80s.  Continue anticoagulation with apixaban.   Continue rate control with carvedilol and digoxin.  Thyrotoxicosis control with prednisone and methimazole.  Caution with digoxin interaction, will need level on 12/27/21.  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   Patient has been on methimazole as outpatient with persistent thyrotoxicosis.  Amiodarone has been held on 10/2021.   Considering failure of monotherapy and severe cardiovascular complication of atrial fibrillation with RVR,  prednisone was added with good toleration.   Plan to continue with 40 mg  prednisone for at least 21 days and then slow taper over the next 3 to 4 months depending on patient's clinical response. Follow up thyroid function testing in 3 weeks.    COPD (chronic obstructive pulmonary disease) (HCC) No clinical signs of exacerbation   Essential hypertension Continue carvedilol, entresto for blood pressure control, diuretic therapy with spironolactone, furosmide and SGLT 2 inh.         Consultants: cardiology  Procedures performed: none  Disposition: Home Diet recommendation:  Cardiac diet DISCHARGE MEDICATION: Allergies as of 12/25/2021   No Known Allergies      Medication List     STOP taking these medications    metoprolol tartrate 50 MG tablet Commonly known as: LOPRESSOR       TAKE these medications    carvedilol 25 MG tablet Commonly known as: COREG Take 1 tablet (25 mg total) by mouth 2 (two) times daily with a meal.   dapagliflozin propanediol 10 MG Tabs tablet Commonly known as: FARXIGA Take 1 tablet (10 mg total) by mouth daily. Start taking on: December 26, 2021   digoxin 0.125 MG tablet Commonly known as: LANOXIN Take 1 tablet (0.125 mg total) by mouth daily. Start taking on: December 26, 2021   Eliquis 5 MG Tabs tablet Generic drug: apixaban TAKE 1 TABLET BY MOUTH TWICE  DAILY What changed: how much to take   furosemide 40 MG tablet Commonly known as: LASIX Take 1 tablet (40 mg total) by mouth daily. Start taking on: December 26, 2021 What changed:  medication strength how much to take   methimazole 5 MG tablet Commonly known as: TAPAZOLE Take 1 tablet (5 mg total) by mouth daily.   predniSONE 20 MG tablet Commonly known as: DELTASONE Take 2 tablets (40 mg total) by mouth daily with breakfast for 21 days. Start taking on: December 26, 2021   sacubitril-valsartan 97-103 MG Commonly known as: ENTRESTO Take 1 tablet by mouth 2 (two) times daily.   spironolactone 25 MG tablet Commonly known as:  ALDACTONE Take 0.5 tablets (12.5 mg total) by mouth daily. Start taking on: December 26, 2021               Durable Medical Equipment  (From admission, onward)           Start     Ordered   12/25/21 1005  For home use only DME Hospital bed  Once       Question Answer Comment  Length of Need 12 Months   Patient has (list medical condition): hear failure   Head must be elevated greater than: 45 degrees   Bed type Semi-electric      12/25/21 1004            Follow-up Information     [facility]. Go in 10 day(s).   Specialty: Cardiology Why: Hospital follow up Please bring a current medication list to appointment FREE valet parking, Entrance C, off [employer] information: [address] [id number] mc [place] [address] [phone]        Swinyer, Zachary George, NP Follow up.   Specialty: Nurse Practitioner Why: Follow-up with General Cardiology scheduled for 01/15/2022 at 8:00am. Please arrive 15 minutes early for check-in. If this date/time does not work for you, please call our office back to reschedule. Contact information: [address] [place] [address]  [phone]         [facility] [address] Dept Of . [facility] Follow up.   Specialty: Cardiology Why: Please come by our office on Wednesday 12/27/2021 for a lab visit so that we can recheck your Digoxin level. Appointment time is listed as 2:30pm but you can actually come anytime from 8am to 4:30pm. Please DO NOT take your Digoxin on this day until after your lab work has been drawn. Contact information: [address] [id number] mc [place] [address] [phone]               Discharge Exam: Ceasar Mons Weights   12/23/21 0515 12/24/21 0438 12/25/21 0520  Weight: 85 kg 85.4 kg 85.6 kg   BP (!) 121/93 (BP Location: Right Arm)   Pulse 75    Temp 97.8 F (36.6 C) (Oral)   Resp 20   Ht 5' 10.5" (1.791 m)   Wt 85.6 kg   SpO2 100%   BMI 26.71 kg/m   Patient is feeling better, no dyspnea, lower extremity edema or palpitations  Neurology awake and alert ENT with no pallor Cardiovascular with S1 and S2 present, irregularly irregular with systolic murmur at apex, with no gallops No JVD No lower extremity edema Respiratory with no rales or wheezing Abdomen with no distention   Condition at discharge: stable  The results of significant diagnostics from this hospitalization (including imaging, microbiology, ancillary and laboratory) are listed below for reference.   Imaging Studies: ECHOCARDIOGRAM COMPLETE  Result Date: 12/21/2021    ECHOCARDIOGRAM REPORT   Patient Name:   CAMILA MAITA Date of Exam: 12/21/2021 Medical Rec #:  [id number]       Height:       72.0 in Accession #:    [id number]      Weight:       196.4 lb Date of Birth:  11/06/1960       BSA:          2.114 m Patient Age:    61 years        BP:           142/107 mmHg Patient Gender: F               HR:           102 bpm. Exam Location:  Inpatient Procedure: 2D Echo, Cardiac Doppler and Color Doppler Indications:    CHF-acute diastolic  History:        Patient has prior history of Echocardiogram examinations, most                 recent 10/04/2020. CHF, COPD, Arrythmias:Atrial Fibrillation,                 Signs/Symptoms:Shortness of Breath; Risk Factors:Hypertension                 and Current Smoker. Non-ischemic cardiomyopathy.  Sonographer:    Ross Ludwig RDCS (AE) Referring Phys: [id number] Darlin Drop  Sonographer Comments: Patient in Fowler's position due to orthopnea. IMPRESSIONS  1. No significant change from echo done in 2020.  2. Left ventricular ejection fraction, by estimation, is 25 to 30%. The left ventricle has severely decreased function. The left ventricle demonstrates global hypokinesis. The left ventricular internal cavity size was moderately to severely  dilated. There is mild left ventricular hypertrophy. Left ventricular diastolic parameters are indeterminate.  3. Right ventricular systolic function is low normal. The right ventricular  size is normal. There is moderately elevated pulmonary artery systolic pressure.  4. Left atrial size was severely dilated.  5. MR is eccentric, posteriorly directed into LA . The mitral valve is abnormal. Severe mitral valve regurgitation.  6. Tricuspid valve regurgitation is mild to moderate.  7. The aortic valve is tricuspid. Aortic valve regurgitation is trivial. Aortic valve sclerosis is present, with no evidence of aortic valve stenosis.  8. The inferior vena cava is dilated in size with <50% respiratory variability, suggesting right atrial pressure of 15 mmHg. FINDINGS  Left Ventricle: Left ventricular ejection fraction, by estimation, is 25 to 30%. The left ventricle has severely decreased function. The left ventricle demonstrates global hypokinesis. The left ventricular internal cavity size was moderately to severely  dilated. There is mild left ventricular hypertrophy. Left ventricular diastolic parameters are indeterminate. Right Ventricle: The right ventricular size is normal. Right vetricular wall thickness was not assessed. Right ventricular systolic function is low normal. There is moderately elevated pulmonary artery systolic pressure. The tricuspid regurgitant velocity is 2.85 m/s, and with an assumed right atrial pressure of 15 mmHg, the estimated right ventricular systolic pressure is 47.5 mmHg. Left Atrium: Left atrial size was severely dilated. Right Atrium: Right atrial size was normal in size. Pericardium: Trivial pericardial effusion is present. Mitral Valve: MR is eccentric, posteriorly directed into LA. The mitral valve is abnormal. There is moderate thickening of the mitral valve leaflet(s). There is mild calcification of the mitral valve leaflet(s). Mild mitral annular calcification. Severe mitral valve  regurgitation. Tricuspid Valve: The tricuspid valve is normal in structure. Tricuspid valve regurgitation is mild to moderate. Aortic Valve: The aortic valve is tricuspid. Aortic valve regurgitation is trivial. Aortic valve sclerosis is present, with no evidence of aortic valve stenosis. Aortic valve mean gradient measures 2.0 mmHg. Aortic valve peak gradient measures 4.0 mmHg. Aortic valve area, by VTI measures 3.27 cm. Pulmonic Valve: The pulmonic valve was normal in structure. Pulmonic valve regurgitation is mild. Aorta: The aortic root and ascending aorta are structurally normal, with no evidence of dilitation. Venous: The inferior vena cava is dilated in size with less than 50% respiratory variability, suggesting right atrial pressure of 15 mmHg. IAS/Shunts: No atrial level shunt detected by color flow Doppler.  LEFT VENTRICLE PLAX 2D LVIDd:         6.00 cm LVIDs:         5.10 cm LV PW:         1.30 cm LV IVS:        0.90 cm LVOT diam:     2.50 cm LV SV:         53 LV SV Index:   25 LVOT Area:     4.91 cm  LV Volumes (MOD) LV vol d, MOD A2C: 123.0 ml LV vol d, MOD A4C: 109.0 ml LV vol s, MOD A2C: 85.8 ml LV vol s, MOD A4C: 83.9 ml LV SV MOD A2C:     37.2 ml LV SV MOD A4C:     109.0 ml LV SV MOD BP:      31.3 ml RIGHT VENTRICLE          IVC RV Basal diam:  3.30 cm  IVC diam: 2.20 cm LEFT ATRIUM              Index        RIGHT ATRIUM           Index LA diam:  5.00 cm  2.36 cm/m   RA Area:     18.70 cm LA Vol (A2C):   165.0 ml 78.04 ml/m  RA Volume:   47.90 ml  22.66 ml/m LA Vol (A4C):   145.0 ml 68.58 ml/m LA Biplane Vol: 153.0 ml 72.37 ml/m  AORTIC VALVE AV Area (Vmax):    3.48 cm AV Area (Vmean):   3.31 cm AV Area (VTI):     3.27 cm AV Vmax:           99.50 cm/s AV Vmean:          67.400 cm/s AV VTI:            0.162 m AV Peak Grad:      4.0 mmHg AV Mean Grad:      2.0 mmHg LVOT Vmax:         70.60 cm/s LVOT Vmean:        45.400 cm/s LVOT VTI:          0.108 m LVOT/AV VTI ratio: 0.67  AORTA Ao  Root diam: 3.20 cm Ao Asc diam:  3.40 cm MR Peak grad: 98.0 mmHg   TRICUSPID VALVE MR Mean grad: 62.0 mmHg   TR Peak grad:   32.5 mmHg MR Vmax:      495.00 cm/s TR Vmax:        285.00 cm/s MR Vmean:     366.0 cm/s                           SHUNTS                           Systemic VTI:  0.11 m                           Systemic Diam: 2.50 cm Dietrich Pates MD Electronically signed by Dietrich Pates MD Signature Date/Time: 12/21/2021/3:39:14 PM    Final    DG Chest 2 View  Result Date: 12/20/2021 CLINICAL DATA:  Chest pain, shortness of breath EXAM: CHEST - 2 VIEW COMPARISON:  03/08/2018 FINDINGS: Transverse diameter of heart is increased. Central pulmonary vessels are more prominent. There is subtle increase in interstitial markings in parahilar regions and lower lung fields. Small bilateral pleural effusions are seen, more so on the right side. There is no pneumothorax. IMPRESSION: Cardiomegaly. Central pulmonary vessels are more prominent suggesting CHF. Increased interstitial markings in both lungs suggest pulmonary edema. Small bilateral pleural effusions. Electronically Signed   By: Ernie Avena M.D.   On: 12/20/2021 13:41    Microbiology: Results for orders placed or performed during the hospital encounter of 12/20/21  Resp Panel by RT-PCR (Flu A&B, Covid) Anterior Nasal Swab     Status: None   Collection Time: 12/20/21  1:08 PM   Specimen: Anterior Nasal Swab  Result Value Ref Range Status   SARS Coronavirus 2 by RT PCR NEGATIVE NEGATIVE Final    Comment: (NOTE) SARS-CoV-2 target nucleic acids are NOT DETECTED.  The SARS-CoV-2 RNA is generally detectable in upper respiratory specimens during the acute phase of infection. The lowest concentration of SARS-CoV-2 viral copies this assay can detect is 138 copies/mL. A negative result does not preclude SARS-Cov-2 infection and should not be used as the sole basis for treatment or other patient management decisions. A negative result may occur  with  improper specimen collection/handling, submission of  specimen other than nasopharyngeal swab, presence of viral mutation(s) within the areas targeted by this assay, and inadequate number of viral copies(<138 copies/mL). A negative result must be combined with clinical observations, patient history, and epidemiological information. The expected result is Negative.  Fact Sheet for Patients:  BloggerCourse.com  Fact Sheet for Healthcare Providers:  SeriousBroker.it  This test is no t yet approved or cleared by the [place] FDA and  has been authorized for detection and/or diagnosis of SARS-CoV-2 by FDA under an Emergency Use Authorization (EUA). This EUA will remain  in effect (meaning this test can be used) for the duration of the COVID-19 declaration under Section 564(b)(1) of the Act, 21 U.S.C.section 360bbb-3(b)(1), unless the authorization is terminated  or revoked sooner.       Influenza A by PCR NEGATIVE NEGATIVE Final   Influenza B by PCR NEGATIVE NEGATIVE Final    Comment: (NOTE) The Xpert Xpress SARS-CoV-2/FLU/RSV plus assay is intended as an aid in the diagnosis of influenza from Nasopharyngeal swab specimens and should not be used as a sole basis for treatment. Nasal washings and aspirates are unacceptable for Xpert Xpress SARS-CoV-2/FLU/RSV testing.  Fact Sheet for Patients: BloggerCourse.com  Fact Sheet for Healthcare Providers: SeriousBroker.it  This test is not yet approved or cleared by the [place] FDA and has been authorized for detection and/or diagnosis of SARS-CoV-2 by FDA under an Emergency Use Authorization (EUA). This EUA will remain in effect (meaning this test can be used) for the duration of the COVID-19 declaration under Section 564(b)(1) of the Act, 21 U.S.C. section 360bbb-3(b)(1), unless the authorization is terminated  or revoked.  Performed at [facility], [address] [place], [place] [address]   MRSA Next Gen by PCR, Nasal     Status: None   Collection Time: 12/21/21  3:23 PM   Specimen: Nasal Mucosa; Nasal Swab  Result Value Ref Range Status   MRSA by PCR Next Gen NOT DETECTED NOT DETECTED Final    Comment: (NOTE) The GeneXpert MRSA Assay (FDA approved for NASAL specimens only), is one component of a comprehensive MRSA colonization surveillance program. It is not intended to diagnose MRSA infection nor to guide or monitor treatment for MRSA infections. Test performance is not FDA approved in patients less than 76 years old. Performed at [facility], [address] [place], [place] [address]     Labs: CBC: Recent Labs  Lab 12/20/21 1313 12/21/21 0140  WBC 6.3 5.7  NEUTROABS 4.4 3.1  HGB 13.1 13.0  HCT 41.2 41.0  MCV 86.2 84.2  PLT 315 293   Basic Metabolic Panel: Recent Labs  Lab 12/20/21 2005 12/21/21 0140 12/22/21 0938 12/23/21 0052 12/24/21 0048 12/25/21 0050  NA  --  142 141 140 140 139  K  --  3.0* 3.9 3.8 3.9 3.8  CL  --  109 106 105 106 107  CO2  --  24 24 27 24 24   GLUCOSE  --  133* 132* 127* 134* 117*  BUN  --  13 14 19 16 19   CREATININE  --  0.77 0.83 0.85 0.76 0.84  CALCIUM  --  9.2 9.2 9.1 8.7* 8.6*  MG 1.6* 1.5* 2.2 2.0  --   --   PHOS  --  3.7  --   --   --   --    Liver Function Tests: Recent Labs  Lab 12/20/21 2005 12/21/21 0140  AST 37 34  ALT 47* 46*  ALKPHOS 73 79  BILITOT 0.8 0.7  PROT 7.0 6.7  ALBUMIN 3.4* 3.4*   CBG: No results for input(s): "GLUCAP" in the last 168 hours.  Discharge time spent: greater than 30 minutes.  Signed: Coralie Keens, MD [facility] 12/25/2021

## 2021-12-25 NOTE — Progress Notes (Signed)
Explained discharge instructions to patient. Reviewed follow up appointment and next medication administration times. Also reviewed education. Patient verbalized having an understanding for instructions given. All belongings are in the patient's possession to include TOC meds. Telemetry was removed. CCMD was notified. No other needs verbalized. Transported downstairs for discharge. 

## 2021-12-25 NOTE — Progress Notes (Signed)
PT Cancellation Note  Patient Details Name: Joanna [name] MRN: [id number] DOB: October 11, 1960   Cancelled Treatment:    Reason Eval/Treat Not Completed: PT screened, no needs identified, will sign off. Pt adamant that she is independent and at baseline, reports she has been ambulating independently in the hospital since she has been here and has no needs for PT at this time. Will sign off at pt's request, please re-consult if change in status or needs.   Vickki Muff, PT, DPT   Acute Rehabilitation Department   Ronnie Derby 12/25/2021, 8:34 AM

## 2021-12-25 NOTE — Progress Notes (Signed)
Rounding Note    Patient Name: Joanna [name] Date of Encounter: 12/25/2021  [facility] Cardiologist: Kristeen Miss, MD   Subjective   BP 108/74. Cr 0.8.  Denies any chest pain or dyspnea.  Inpatient Medications    Scheduled Meds:  apixaban  5 mg Oral BID   carvedilol  25 mg Oral BID WC   dapagliflozin propanediol  10 mg Oral Daily   digoxin  0.125 mg Oral Daily   furosemide  40 mg Oral Daily   methimazole  5 mg Oral Daily   predniSONE  40 mg Oral Q breakfast   sacubitril-valsartan  1 tablet Oral BID   spironolactone  12.5 mg Oral Daily   Continuous Infusions:  PRN Meds: acetaminophen, melatonin, polyethylene glycol, prochlorperazine, traMADol   Vital Signs    Vitals:   12/24/21 1100 12/24/21 1647 12/24/21 1918 12/25/21 0520  BP:  113/83 114/83 108/74  Pulse:   89 60  Resp:  20 16 18   Temp:  97.8 F (36.6 C) 98.4 F (36.9 C) 97.7 F (36.5 C)  TempSrc:  Oral Oral Oral  SpO2: 99% 96% 97% 97%  Weight:    85.6 kg  Height:        Intake/Output Summary (Last 24 hours) at 12/25/2021 0757 Last data filed at 12/25/2021 0516 Gross per 24 hour  Intake 1313 ml  Output 1300 ml  Net 13 ml       12/25/2021    5:20 AM 12/24/2021    4:38 AM 12/23/2021    5:15 AM  Last 3 Weights  Weight (lbs) 188 lb 12.8 oz 188 lb 4.8 oz 187 lb 6.4 oz  Weight (kg) 85.639 kg 85.412 kg 85.004 kg      Telemetry    Atrial fibrillation rate controlled- Personally Reviewed  Physical Exam   GEN: NAD Neck: Supple Cardiac: irregular Respiratory: CTA GI: Soft, NT/ND MS: No edema Neuro:  Grossly intact Psych: Normal affect   Labs    High Sensitivity Troponin:   Recent Labs  Lab 12/20/21 2005 12/20/21 2138  TROPONINIHS 32* 28*      Chemistry Recent Labs  Lab 12/20/21 2005 12/21/21 0140 12/22/21 0938 12/23/21 0052 12/24/21 0048 12/25/21 0050  NA  --  142 141 140 140 139  K  --  3.0* 3.9 3.8 3.9 3.8  CL  --  109 106 105 106 107  CO2  --  24 24 27  24 24   GLUCOSE  --  133* 132* 127* 134* 117*  BUN  --  13 14 19 16 19   CREATININE  --  0.77 0.83 0.85 0.76 0.84  CALCIUM  --  9.2 9.2 9.1 8.7* 8.6*  MG 1.6* 1.5* 2.2 2.0  --   --   PROT 7.0 6.7  --   --   --   --   ALBUMIN 3.4* 3.4*  --   --   --   --   AST 37 34  --   --   --   --   ALT 47* 46*  --   --   --   --   ALKPHOS 73 79  --   --   --   --   BILITOT 0.8 0.7  --   --   --   --   GFRNONAA  --  >60 >60 >60 >60 >60  ANIONGAP  --  9 11 8 10 8      Hematology Recent Labs  Lab 12/20/21 1313  12/21/21 0140  WBC 6.3 5.7  RBC 4.78 4.87  HGB 13.1 13.0  HCT 41.2 41.0  MCV 86.2 84.2  MCH 27.4 26.7  MCHC 31.8 31.7  RDW 13.9 14.0  PLT 315 293    Thyroid  Recent Labs  Lab 12/20/21 2000 12/20/21 2005  TSH <0.010*  --   FREET4  --  1.97*     BNP Recent Labs  Lab 12/20/21 1313  BNP 1,481.1*      Radiology    No results found.   Patient Profile     61 year old female with past medical history of nonischemic cardiomyopathy improved, mitral regurgitation, persistent atrial fibrillation, chronic combined systolic/diastolic congestive heart failure, hypertension, tobacco abuse for evaluation of atrial fibrillation and acute on chronic combined systolic/diastolic congestive heart failure.  Patient has a history of atrial fibrillation that recurred following cardioversion.  She was placed on amiodarone but developed hyperthyroidism.  Amiodarone was discontinued on October 16, 2021. Now admitted with CHF symptoms.  Assessment & Plan    1 atrial fibrillation with rapid ventricular response-as outlined previously elevated rate is likely being driven by hyperthyroidism.  Continue carvedilol 25 mg BID.  Continue digoxin.  Note amiodarone caused hyperthyroidism and presently being treated with methimazole and prednisone.  Once her hyperthyroidism is treated we will plan to refer to atrial fibrillation clinic for consideration of Tikosyn versus ablation.  I do not think she will  hold sinus in the setting of active hyperthyroidism.     2 acute on chronic combined systolic/diastolic congestive heart failure-volume status has improved.  Continue PO Lasix 40 mg daily and spironolactone.  We will continue Entresto but decreasde to 24/26 twice daily.  Continue carvedilol, Farxiga.  Cardiomyopathy is likely tachycardia mediated.  We will plan to reassess with follow-up echocardiogram 3 months after heart rate controlled or sinus is reestablished.    3 mitral regurgitation-severe on follow-up echocardiogram.  This will also need to be reassessed once heart rate is controlled or sinus reestablished.   4 hyperthyroidism-amiodarone was discontinued since September 11.  Continue methimazole.  Once hyperthyroidism is corrected will address atrial fibrillation rhythm control.   5 hypertension-blood pressure controlled.  Plan as outlined above.   6 tobacco abuse-patient previously counseled on discontinuing.   [facility] will sign off.   Medication Recommendations:  Continue eliquis 5 mg BID, coreg 25 mg BID, farxiga 10 mg daily, digoxin 0.125 mg daily, entresto 24/26 mg BID, spironolactone 12.5 mg daily Other recommendations (labs, testing, etc):  Digoxin level on 11/22 Follow up as an outpatient:  Will schedule   For questions or updates, please contact [facility] Please consult [email or web address] for contact info under        Signed, Little Ishikawa, MD  12/25/2021, 7:57 AM

## 2021-12-25 NOTE — Plan of Care (Signed)
  Problem: Education: Goal: Knowledge of General Education information will improve Description: Including pain rating scale, medication(s)/side effects and non-pharmacologic comfort measures Outcome: Adequate for Discharge   Problem: Health Behavior/Discharge Planning: Goal: Ability to manage health-related needs will improve Outcome: Adequate for Discharge   Problem: Clinical Measurements: Goal: Ability to maintain clinical measurements within normal limits will improve Outcome: Adequate for Discharge Goal: Will remain free from infection Outcome: Adequate for Discharge Goal: Diagnostic test results will improve Outcome: Adequate for Discharge Goal: Respiratory complications will improve Outcome: Adequate for Discharge Goal: Cardiovascular complication will be avoided Outcome: Adequate for Discharge   Problem: Activity: Goal: Risk for activity intolerance will decrease Outcome: Adequate for Discharge   Problem: Nutrition: Goal: Adequate nutrition will be maintained Outcome: Adequate for Discharge   Problem: Coping: Goal: Level of anxiety will decrease Outcome: Adequate for Discharge   Problem: Elimination: Goal: Will not experience complications related to bowel motility Outcome: Adequate for Discharge Goal: Will not experience complications related to urinary retention Outcome: Adequate for Discharge   Problem: Pain Managment: Goal: General experience of comfort will improve Outcome: Adequate for Discharge   Problem: Safety: Goal: Ability to remain free from injury will improve Outcome: Adequate for Discharge   Problem: Skin Integrity: Goal: Risk for impaired skin integrity will decrease Outcome: Adequate for Discharge   Problem: Education: Goal: Knowledge of General Education information will improve Description: Including pain rating scale, medication(s)/side effects and non-pharmacologic comfort measures Outcome: Adequate for Discharge   Problem: Health  Behavior/Discharge Planning: Goal: Ability to manage health-related needs will improve Outcome: Adequate for Discharge   Problem: Clinical Measurements: Goal: Ability to maintain clinical measurements within normal limits will improve Outcome: Adequate for Discharge Goal: Will remain free from infection Outcome: Adequate for Discharge Goal: Diagnostic test results will improve Outcome: Adequate for Discharge Goal: Respiratory complications will improve Outcome: Adequate for Discharge Goal: Cardiovascular complication will be avoided Outcome: Adequate for Discharge   Problem: Activity: Goal: Risk for activity intolerance will decrease Outcome: Adequate for Discharge   Problem: Nutrition: Goal: Adequate nutrition will be maintained Outcome: Adequate for Discharge   Problem: Coping: Goal: Level of anxiety will decrease Outcome: Adequate for Discharge   Problem: Elimination: Goal: Will not experience complications related to bowel motility Outcome: Adequate for Discharge Goal: Will not experience complications related to urinary retention Outcome: Adequate for Discharge   Problem: Pain Managment: Goal: General experience of comfort will improve Outcome: Adequate for Discharge   Problem: Safety: Goal: Ability to remain free from injury will improve Outcome: Adequate for Discharge   Problem: Skin Integrity: Goal: Risk for impaired skin integrity will decrease Outcome: Adequate for Discharge   Problem: Education: Goal: Ability to demonstrate management of disease process will improve Outcome: Adequate for Discharge Goal: Ability to verbalize understanding of medication therapies will improve Outcome: Adequate for Discharge Goal: Individualized Educational Video(s) Outcome: Adequate for Discharge   Problem: Activity: Goal: Capacity to carry out activities will improve Outcome: Adequate for Discharge   Problem: Cardiac: Goal: Ability to achieve and maintain adequate  cardiopulmonary perfusion will improve Outcome: Adequate for Discharge   

## 2021-12-25 NOTE — Plan of Care (Signed)
Problem: Education: Goal: Knowledge of General Education information will improve Description: Including pain rating scale, medication(s)/side effects and non-pharmacologic comfort measures 12/25/2021 1201 by Ernie Hew, RN Outcome: Adequate for Discharge 12/25/2021 1159 by Ernie Hew, RN Outcome: Adequate for Discharge 12/25/2021 1159 by Ernie Hew, RN Outcome: Adequate for Discharge   Problem: Health Behavior/Discharge Planning: Goal: Ability to manage health-related needs will improve 12/25/2021 1201 by Ernie Hew, RN Outcome: Adequate for Discharge 12/25/2021 1159 by Ernie Hew, RN Outcome: Adequate for Discharge 12/25/2021 1159 by Ernie Hew, RN Outcome: Adequate for Discharge   Problem: Clinical Measurements: Goal: Ability to maintain clinical measurements within normal limits will improve 12/25/2021 1201 by Ernie Hew, RN Outcome: Adequate for Discharge 12/25/2021 1159 by Ernie Hew, RN Outcome: Adequate for Discharge 12/25/2021 1159 by Ernie Hew, RN Outcome: Adequate for Discharge Goal: Will remain free from infection 12/25/2021 1201 by Ernie Hew, RN Outcome: Adequate for Discharge 12/25/2021 1159 by Ernie Hew, RN Outcome: Adequate for Discharge 12/25/2021 1159 by Ernie Hew, RN Outcome: Adequate for Discharge Goal: Diagnostic test results will improve 12/25/2021 1201 by Ernie Hew, RN Outcome: Adequate for Discharge 12/25/2021 1159 by Ernie Hew, RN Outcome: Adequate for Discharge 12/25/2021 1159 by Ernie Hew, RN Outcome: Adequate for Discharge Goal: Respiratory complications will improve 12/25/2021 1201 by Ernie Hew, RN Outcome: Adequate for Discharge 12/25/2021 1159 by Ernie Hew, RN Outcome: Adequate for Discharge 12/25/2021 1159 by Ernie Hew, RN Outcome: Adequate for Discharge Goal: Cardiovascular complication will be avoided 12/25/2021 1201  by Ernie Hew, RN Outcome: Adequate for Discharge 12/25/2021 1159 by Ernie Hew, RN Outcome: Adequate for Discharge 12/25/2021 1159 by Ernie Hew, RN Outcome: Adequate for Discharge   Problem: Activity: Goal: Risk for activity intolerance will decrease 12/25/2021 1201 by Ernie Hew, RN Outcome: Adequate for Discharge 12/25/2021 1159 by Ernie Hew, RN Outcome: Adequate for Discharge 12/25/2021 1159 by Ernie Hew, RN Outcome: Adequate for Discharge   Problem: Nutrition: Goal: Adequate nutrition will be maintained 12/25/2021 1201 by Ernie Hew, RN Outcome: Adequate for Discharge 12/25/2021 1159 by Ernie Hew, RN Outcome: Adequate for Discharge 12/25/2021 1159 by Ernie Hew, RN Outcome: Adequate for Discharge   Problem: Coping: Goal: Level of anxiety will decrease 12/25/2021 1201 by Ernie Hew, RN Outcome: Adequate for Discharge 12/25/2021 1159 by Ernie Hew, RN Outcome: Adequate for Discharge 12/25/2021 1159 by Ernie Hew, RN Outcome: Adequate for Discharge   Problem: Elimination: Goal: Will not experience complications related to bowel motility 12/25/2021 1201 by Ernie Hew, RN Outcome: Adequate for Discharge 12/25/2021 1159 by Ernie Hew, RN Outcome: Adequate for Discharge 12/25/2021 1159 by Ernie Hew, RN Outcome: Adequate for Discharge Goal: Will not experience complications related to urinary retention 12/25/2021 1201 by Ernie Hew, RN Outcome: Adequate for Discharge 12/25/2021 1159 by Ernie Hew, RN Outcome: Adequate for Discharge 12/25/2021 1159 by Ernie Hew, RN Outcome: Adequate for Discharge   Problem: Pain Managment: Goal: General experience of comfort will improve 12/25/2021 1201 by Ernie Hew, RN Outcome: Adequate for Discharge 12/25/2021 1159 by Ernie Hew, RN Outcome: Adequate for Discharge 12/25/2021 1159 by Ernie Hew,  RN Outcome: Adequate for Discharge   Problem: Safety: Goal: Ability to remain free from injury will improve 12/25/2021 1201 by Ernie Hew, RN Outcome: Adequate for Discharge 12/25/2021 1159 by Ernie Hew,  RN Outcome: Adequate for Discharge 12/25/2021 1159 by Ernie Hew, RN Outcome: Adequate for Discharge   Problem: Skin Integrity: Goal: Risk for impaired skin integrity will decrease 12/25/2021 1201 by Ernie Hew, RN Outcome: Adequate for Discharge 12/25/2021 1159 by Ernie Hew, RN Outcome: Adequate for Discharge 12/25/2021 1159 by Ernie Hew, RN Outcome: Adequate for Discharge   Problem: Education: Goal: Knowledge of General Education information will improve Description: Including pain rating scale, medication(s)/side effects and non-pharmacologic comfort measures 12/25/2021 1201 by Ernie Hew, RN Outcome: Adequate for Discharge 12/25/2021 1159 by Ernie Hew, RN Outcome: Adequate for Discharge 12/25/2021 1159 by Ernie Hew, RN Outcome: Adequate for Discharge   Problem: Health Behavior/Discharge Planning: Goal: Ability to manage health-related needs will improve 12/25/2021 1201 by Ernie Hew, RN Outcome: Adequate for Discharge 12/25/2021 1159 by Ernie Hew, RN Outcome: Adequate for Discharge 12/25/2021 1159 by Ernie Hew, RN Outcome: Adequate for Discharge   Problem: Clinical Measurements: Goal: Ability to maintain clinical measurements within normal limits will improve 12/25/2021 1201 by Ernie Hew, RN Outcome: Adequate for Discharge 12/25/2021 1159 by Ernie Hew, RN Outcome: Adequate for Discharge 12/25/2021 1159 by Ernie Hew, RN Outcome: Adequate for Discharge Goal: Will remain free from infection 12/25/2021 1201 by Ernie Hew, RN Outcome: Adequate for Discharge 12/25/2021 1159 by Ernie Hew, RN Outcome: Adequate for Discharge 12/25/2021 1159 by Ernie Hew, RN Outcome: Adequate for Discharge Goal: Diagnostic test results will improve 12/25/2021 1201 by Ernie Hew, RN Outcome: Adequate for Discharge 12/25/2021 1159 by Ernie Hew, RN Outcome: Adequate for Discharge 12/25/2021 1159 by Ernie Hew, RN Outcome: Adequate for Discharge Goal: Respiratory complications will improve 12/25/2021 1201 by Ernie Hew, RN Outcome: Adequate for Discharge 12/25/2021 1159 by Ernie Hew, RN Outcome: Adequate for Discharge 12/25/2021 1159 by Ernie Hew, RN Outcome: Adequate for Discharge Goal: Cardiovascular complication will be avoided 12/25/2021 1201 by Ernie Hew, RN Outcome: Adequate for Discharge 12/25/2021 1159 by Ernie Hew, RN Outcome: Adequate for Discharge 12/25/2021 1159 by Ernie Hew, RN Outcome: Adequate for Discharge   Problem: Activity: Goal: Risk for activity intolerance will decrease 12/25/2021 1201 by Ernie Hew, RN Outcome: Adequate for Discharge 12/25/2021 1159 by Ernie Hew, RN Outcome: Adequate for Discharge 12/25/2021 1159 by Ernie Hew, RN Outcome: Adequate for Discharge   Problem: Nutrition: Goal: Adequate nutrition will be maintained 12/25/2021 1201 by Ernie Hew, RN Outcome: Adequate for Discharge 12/25/2021 1159 by Ernie Hew, RN Outcome: Adequate for Discharge 12/25/2021 1159 by Ernie Hew, RN Outcome: Adequate for Discharge   Problem: Coping: Goal: Level of anxiety will decrease 12/25/2021 1201 by Ernie Hew, RN Outcome: Adequate for Discharge 12/25/2021 1159 by Ernie Hew, RN Outcome: Adequate for Discharge 12/25/2021 1159 by Ernie Hew, RN Outcome: Adequate for Discharge   Problem: Elimination: Goal: Will not experience complications related to bowel motility 12/25/2021 1201 by Ernie Hew, RN Outcome: Adequate for Discharge 12/25/2021 1159 by Ernie Hew, RN Outcome: Adequate for  Discharge 12/25/2021 1159 by Ernie Hew, RN Outcome: Adequate for Discharge Goal: Will not experience complications related to urinary retention 12/25/2021 1201 by Ernie Hew, RN Outcome: Adequate for Discharge 12/25/2021 1159 by Ernie Hew, RN Outcome: Adequate for Discharge 12/25/2021 1159 by Ernie Hew, RN Outcome: Adequate for Discharge   Problem: Pain Managment: Goal: General experience of comfort will  improve 12/25/2021 1201 by Ernie Hew, RN Outcome: Adequate for Discharge 12/25/2021 1159 by Ernie Hew, RN Outcome: Adequate for Discharge 12/25/2021 1159 by Ernie Hew, RN Outcome: Adequate for Discharge   Problem: Safety: Goal: Ability to remain free from injury will improve 12/25/2021 1201 by Ernie Hew, RN Outcome: Adequate for Discharge 12/25/2021 1159 by Ernie Hew, RN Outcome: Adequate for Discharge 12/25/2021 1159 by Ernie Hew, RN Outcome: Adequate for Discharge   Problem: Skin Integrity: Goal: Risk for impaired skin integrity will decrease 12/25/2021 1201 by Ernie Hew, RN Outcome: Adequate for Discharge 12/25/2021 1159 by Ernie Hew, RN Outcome: Adequate for Discharge 12/25/2021 1159 by Ernie Hew, RN Outcome: Adequate for Discharge   Problem: Education: Goal: Ability to demonstrate management of disease process will improve 12/25/2021 1201 by Ernie Hew, RN Outcome: Adequate for Discharge 12/25/2021 1159 by Ernie Hew, RN Outcome: Adequate for Discharge 12/25/2021 1159 by Ernie Hew, RN Outcome: Adequate for Discharge Goal: Ability to verbalize understanding of medication therapies will improve 12/25/2021 1201 by Ernie Hew, RN Outcome: Adequate for Discharge 12/25/2021 1159 by Ernie Hew, RN Outcome: Adequate for Discharge 12/25/2021 1159 by Ernie Hew, RN Outcome: Adequate for Discharge Goal: Individualized Educational  Video(s) 12/25/2021 1201 by Ernie Hew, RN Outcome: Adequate for Discharge 12/25/2021 1159 by Ernie Hew, RN Outcome: Adequate for Discharge 12/25/2021 1159 by Ernie Hew, RN Outcome: Adequate for Discharge   Problem: Activity: Goal: Capacity to carry out activities will improve 12/25/2021 1201 by Ernie Hew, RN Outcome: Adequate for Discharge 12/25/2021 1159 by Ernie Hew, RN Outcome: Adequate for Discharge 12/25/2021 1159 by Ernie Hew, RN Outcome: Adequate for Discharge   Problem: Cardiac: Goal: Ability to achieve and maintain adequate cardiopulmonary perfusion will improve 12/25/2021 1201 by Ernie Hew, RN Outcome: Adequate for Discharge 12/25/2021 1159 by Ernie Hew, RN Outcome: Adequate for Discharge 12/25/2021 1159 by Ernie Hew, RN Outcome: Adequate for Discharge

## 2021-12-25 NOTE — Care Management Important Message (Signed)
Important Message  Patient Details  Name: Joanna [name] MRN: [id number] Date of Birth: Jul 23, 1960   Medicare Important Message Given:  Yes     Renie Ora 12/25/2021, 11:08 AM

## 2021-12-26 ENCOUNTER — Telehealth: Payer: Self-pay

## 2021-12-26 DIAGNOSIS — J449 Chronic obstructive pulmonary disease, unspecified: Secondary | ICD-10-CM | POA: Diagnosis not present

## 2021-12-26 DIAGNOSIS — E064 Drug-induced thyroiditis: Secondary | ICD-10-CM | POA: Diagnosis not present

## 2021-12-26 DIAGNOSIS — I480 Paroxysmal atrial fibrillation: Secondary | ICD-10-CM | POA: Diagnosis not present

## 2021-12-26 DIAGNOSIS — I5033 Acute on chronic diastolic (congestive) heart failure: Secondary | ICD-10-CM | POA: Diagnosis not present

## 2021-12-26 NOTE — Patient Outreach (Signed)
  Care Coordination TOC Note Transition Care Management Follow-up Telephone Call Date of discharge and from where: 12/25/21-[facility] Dx: Acute on chronic CHF How have you been since you were released from the hospital? Patient voices that she is doing well since returning home yesterday evening. She had to sleep on sofa as family removed her bed in anticipation of hospital bed being delivered. However, they were not able to deliver the bed until today.  Any questions or concerns? No  Items Reviewed: Did the pt receive and understand the discharge instructions provided? Yes  Medications obtained and verified? Yes  Other? Yes -HF mgmt, wgt, Any new allergies since your discharge? No  Dietary orders reviewed? Yes Do you have support at home? Yes   Home Care and Equipment/Supplies: Were home health services ordered? not applicable If so, what is the name of the agency? N/A  Has the agency set up a time to come to the patient's home? not applicable Were any new equipment or medical supplies ordered?  Yes: hospital bed What is the name of the medical supply agency? [employer] Were you able to get the supplies/equipment? Per pt they called yesterday and told her bed to be delivered today. She was provided with agency contact info to follow up if she does not hear from them  Do you have any questions related to the use of the equipment or supplies? No  Functional Questionnaire: (I = Independent and D = Dependent) ADLs: I  Bathing/Dressing- I  Meal Prep- I  Eating- I  Maintaining continence- I  Transferring/Ambulation- I  Managing Meds- I  Follow up appointments reviewed:  PCP Hospital f/u appt confirmed?  Patient will call PCP office today when her sister arrives to make an appt and coordinate with sister's schedule as she will be taking her to appt . Specialist Hospital f/u appt confirmed? Yes  [facility] @ 2pm. Are transportation  arrangements needed? No  If their condition worsens, is the pt aware to call PCP or go to the Emergency Dept.? Yes Was the patient provided with contact information for the PCP's office or ED? Yes Was to pt encouraged to call back with questions or concerns? Yes  SDOH assessments and interventions completed:   Yes  Care Coordination Interventions Activated:  Yes    Care Coordination Interventions:  Education provided    Encounter Outcome:  Pt. Visit Completed    Antionette Fairy, RN,BSN,CCM [facility] Care Management Telephonic Care Management Coordinator Direct Phone: [phone] Toll Free: [phone] Fax: [phone]

## 2021-12-27 ENCOUNTER — Other Ambulatory Visit: Payer: Self-pay

## 2021-12-27 ENCOUNTER — Ambulatory Visit: Payer: Medicare Other | Attending: Nurse Practitioner | Admitting: Nurse Practitioner

## 2021-12-27 DIAGNOSIS — I4891 Unspecified atrial fibrillation: Secondary | ICD-10-CM | POA: Diagnosis not present

## 2021-12-29 LAB — DIGOXIN LEVEL: Digoxin, Serum: 0.6 ng/mL (ref 0.5–0.9)

## 2021-12-30 ENCOUNTER — Other Ambulatory Visit: Payer: Self-pay | Admitting: Cardiovascular Disease

## 2022-01-03 ENCOUNTER — Encounter (HOSPITAL_COMMUNITY): Payer: Medicare Other

## 2022-01-08 ENCOUNTER — Other Ambulatory Visit: Payer: Self-pay | Admitting: Cardiovascular Disease

## 2022-01-08 ENCOUNTER — Other Ambulatory Visit (HOSPITAL_COMMUNITY): Payer: Self-pay

## 2022-01-08 ENCOUNTER — Telehealth: Payer: Self-pay | Admitting: Cardiovascular Disease

## 2022-01-08 NOTE — Telephone Encounter (Signed)
*  STAT* If patient is at the pharmacy, call can be transferred to refill team.   1. Which medications need to be refilled? (please list name of each medication and dose if known) spironolactone (ALDACTONE) 25 MG tablet   2. Which pharmacy/location (including street and city if local pharmacy) is medication to be sent to? [facility] on [address]  3. Do they need a 30 day or 90 day supply? 30 day  Patient says she has 1 tablet left.

## 2022-01-10 ENCOUNTER — Telehealth (HOSPITAL_COMMUNITY): Payer: Self-pay

## 2022-01-10 ENCOUNTER — Encounter (HOSPITAL_COMMUNITY): Payer: Self-pay

## 2022-01-10 ENCOUNTER — Ambulatory Visit (HOSPITAL_COMMUNITY)
Admission: RE | Admit: 2022-01-10 | Discharge: 2022-01-10 | Disposition: A | Discharge status: Home / Self Care | Payer: Medicare Other | Source: Ambulatory Visit | Attending: Cardiology | Admitting: Cardiology

## 2022-01-10 VITALS — BP 144/104 | HR 77 | Ht 72.0 in | Wt 187.2 lb

## 2022-01-10 DIAGNOSIS — E0590 Thyrotoxicosis, unspecified without thyrotoxic crisis or storm: Secondary | ICD-10-CM | POA: Insufficient documentation

## 2022-01-10 DIAGNOSIS — Z7984 Long term (current) use of oral hypoglycemic drugs: Secondary | ICD-10-CM | POA: Insufficient documentation

## 2022-01-10 DIAGNOSIS — Z7952 Long term (current) use of systemic steroids: Secondary | ICD-10-CM | POA: Diagnosis not present

## 2022-01-10 DIAGNOSIS — Z79899 Other long term (current) drug therapy: Secondary | ICD-10-CM | POA: Insufficient documentation

## 2022-01-10 DIAGNOSIS — I110 Hypertensive heart disease with heart failure: Secondary | ICD-10-CM | POA: Insufficient documentation

## 2022-01-10 DIAGNOSIS — I083 Combined rheumatic disorders of mitral, aortic and tricuspid valves: Secondary | ICD-10-CM | POA: Insufficient documentation

## 2022-01-10 DIAGNOSIS — I5022 Chronic systolic (congestive) heart failure: Secondary | ICD-10-CM | POA: Diagnosis not present

## 2022-01-10 DIAGNOSIS — Z7901 Long term (current) use of anticoagulants: Secondary | ICD-10-CM | POA: Insufficient documentation

## 2022-01-10 DIAGNOSIS — I4891 Unspecified atrial fibrillation: Secondary | ICD-10-CM | POA: Diagnosis not present

## 2022-01-10 DIAGNOSIS — I4819 Other persistent atrial fibrillation: Secondary | ICD-10-CM | POA: Diagnosis not present

## 2022-01-10 LAB — DIGOXIN LEVEL: Digoxin Level: 0.7 ng/mL — ABNORMAL LOW (ref 0.8–2.0)

## 2022-01-10 MED ORDER — SPIRONOLACTONE 25 MG PO TABS: 12.5000 mg | ORAL_TABLET | Freq: Every day | ORAL | 3 refills | Status: DC

## 2022-01-10 NOTE — Patient Instructions (Signed)
No med changes today. Refill sent for [facility] as requested. Appointment scheduled for [facility] appointment.' 4.   Return to [facility] and see Dr. Gasper Lloyd next week.  5.   Labs drawn today - will call you if abnormal.

## 2022-01-10 NOTE — Progress Notes (Signed)
ReDS Vest / Clip - 01/10/22 1500       ReDS Vest / Clip   Station Marker C    Ruler Value 31    ReDS Value Range Low volume    ReDS Actual Value 33

## 2022-01-10 NOTE — Progress Notes (Signed)
HEART & VASCULAR TRANSITION OF CARE CONSULT NOTE     Referring Physician: Dr. Ella Jubilee Primary Care: Marcine Matar, MD Primary Cardiologist: Kristeen Miss, MD   HPI: Referred to clinic by Dr. Ella Jubilee for heart failure consultation.   61 y/o female w/ h/o HTN and longstanding history of nonischemic cardiomyopathy with chronic combined systolic and diastolic heart failure with an LVEF as low as 20-25% and grade 2 DD.  She had normal coronaries by heart catheterization in 2007 - NICM felt related to uncontrolled hypertension. EF improved to 40-45% by echocardiogram in 2017. Repeat echo 10/04/20 showed improved LVEF 55-60% with moderate LVH and moderate MR.    Also w/ h/o difficult to control atrial fibrillation and mitral regurgitation. Has undergone multiple DCCVs w/ early recurrence. Developed hyperthyroidism w/ amiodarone. Now being treated w/ methimazole and prednisone. Amiodarone was discontinued on October 16, 2021.   Recently admitted to [facility] w/ acute CHF in the setting of rapid Afib. Echo showed EF back down again, 25-30% w/ global HK, LV mod-severely dilated, LA severely dilated w/ eccentric MR, posteriorly directed into LA, severe regurgitation. RV mildly reduced w/ mod elevated PASP 48 mmHg and mild-mod TR. Was diuresed w/ IV Lasix and transitioned to PO + additions of Farxiga and spironolactone. Treated w/ Coreg and digoxin for rate control w/ plans to refer to Afib clinic for consideration of Tikosyn vs AF ablation. Discharge wt 188 lb. Referred to [facility] clinic.   Presents today for f/u. Here w/ her 2 sisters. Reports significant improvement in breathing. Able to do ALDs at home w/o much limitation. NYHA Class II. Denies LEE, orthopnea/PND. Remains in Afib w/ RVR 139 bpm despite compliance w/ Coreg and digoxin. Fairly asymptomatic w/o palpitations. Denies CP. BP normotensive. Well appearing. Extremities warm. ReDs 33%.  We discussed re attempt at DCCV, but she says she had early  recurrence w/ previous attempt.     Cardiac Testing   2D Echo 12/31/21  1. No significant change from echo done in 2020. Left ventricular ejection fraction, by estimation, is 25 to 30%. The left ventricle has severely decreased function. The left ventricle demonstrates global hypokinesis. The left ventricular internal cavity size was moderately to severely dilated. There is mild left ventricular hypertrophy. Left ventricular diastolic parameters are indeterminate. 2. Right ventricular systolic function is low normal. The right ventricular size is normal. There is moderately elevated pulmonary artery systolic pressure. 3. 4. Left atrial size was severely dilated. MR is eccentric, posteriorly directed into LA . The mitral valve is abnormal. Severe mitral valve regurgitation. 5. 6. Tricuspid valve regurgitation is mild to moderate. The aortic valve is tricuspid. Aortic valve regurgitation is trivial. Aortic valve sclerosis is present, with no evidence of aortic valve stenosis. 7. The inferior vena cava is dilated in size with <50% respiratory variability, suggesting right atrial pressure of 15 mmHg.   Review of Systems: [y] = yes, [ ]  = no   General: Weight gain [ ] ; Weight loss [ ] ; Anorexia [ ] ; Fatigue [ ] ; Fever [ ] ; Chills [ ] ; Weakness [ ]   Cardiac: Chest pain/pressure [ ] ; Resting SOB [ ] ; Exertional SOB [ ] ; Orthopnea [ ] ; Pedal Edema [ ] ; Palpitations [ ] ; Syncope [ ] ; Presyncope [ ] ; Paroxysmal nocturnal dyspnea[ ]   Pulmonary: Cough [ ] ; Wheezing[ ] ; Hemoptysis[ ] ; Sputum [ ] ; Snoring [ ]   GI: Vomiting[ ] ; Dysphagia[ ] ; Melena[ ] ; Hematochezia [ ] ; Heartburn[ ] ; Abdominal pain [ ] ; Constipation [ ] ; Diarrhea [ ] ;  BRBPR [ ]   GU: Hematuria[ ] ; Dysuria [ ] ; Nocturia[ ]   Vascular: Pain in legs with walking [ ] ; Pain in feet with lying flat [ ] ; Non-healing sores [ ] ; Stroke [ ] ; TIA [ ] ; Slurred speech [ ] ;  Neuro: Headaches[ ] ; Vertigo[ ] ; Seizures[ ] ; Paresthesias[ ] ;Blurred  vision [ ] ; Diplopia [ ] ; Vision changes [ ]   Ortho/Skin: Arthritis [ ] ; Joint pain [ ] ; Muscle pain [ ] ; Joint swelling [ ] ; Back Pain [ ] ; Rash [ ]   Psych: Depression[ ] ; Anxiety[ ]   Heme: Bleeding problems [ ] ; Clotting disorders [ ] ; Anemia [ ]   Endocrine: Diabetes [ ] ; Thyroid dysfunction[ ]    Past Medical History:  Diagnosis Date   Atrial fibrillation with RVR (HCC)    Chronic combined systolic (congestive) and diastolic (congestive) heart failure (HCC)    Dyspnea    Goiter    Hypertension    NICM (nonischemic cardiomyopathy) (HCC)    a. 08/2005 Echo: EF 30-35%, mod diff HK. Mild to Mod MR. Mildly dil LA; b. 08/2005 Cath: Nl Cors. Elevated CO w/o shunt; c. 09/2007 Echo: EF 45%. Mild to mod MR; c. 03/2015 Echo: EF 45%, global HK. Gr1 DD. Mild MR. Mildly dil LA.    Current Outpatient Medications  Medication Sig Dispense Refill   carvedilol (COREG) 25 MG tablet Take 1 tablet (25 mg total) by mouth 2 (two) times daily with a meal. 60 tablet 0   dapagliflozin propanediol (FARXIGA) 10 MG TABS tablet Take 1 tablet (10 mg total) by mouth daily. 30 tablet 0   digoxin (LANOXIN) 0.125 MG tablet Take 1 tablet (0.125 mg total) by mouth daily. 30 tablet 0   ELIQUIS 5 MG TABS tablet TAKE 1 TABLET BY MOUTH TWICE  DAILY (Patient taking differently: Take 5 mg by mouth 2 (two) times daily.) 200 tablet 2   ENTRESTO 97-103 MG TAKE 1 TABLET BY MOUTH TWICE  DAILY 200 tablet 2   furosemide (LASIX) 40 MG tablet Take 1 tablet (40 mg total) by mouth daily. 30 tablet 0   methimazole (TAPAZOLE) 5 MG tablet Take 1 tablet (5 mg total) by mouth daily. 90 tablet 0   predniSONE (DELTASONE) 20 MG tablet Take 2 tablets (40 mg total) by mouth daily with breakfast for 21 days. 42 tablet 0   spironolactone (ALDACTONE) 25 MG tablet Take 0.5 tablets (12.5 mg total) by mouth daily. 45 tablet 3   No current facility-administered medications for this encounter.    No Known Allergies    Social History   Socioeconomic  History   Marital status: Single    Spouse name: Not on file   Number of children: 1   Years of education: Not on file   Highest education level: High school graduate  Occupational History   Occupation: unemployed    Comment: on disablity  Tobacco Use   Smoking status: Former    Packs/day: 2.00    Years: 30.00    Total pack years: 60.00    Types: Cigarettes    Quit date: 02/08/2018    Years since quitting: 3.9   Smokeless tobacco: Never  Vaping Use   Vaping Use: Never used  Substance and Sexual Activity   Alcohol use: No    Comment: quit 2 years ago   Drug use: No   Sexual activity: Not on file  Other Topics Concern   Not on file  Social History Narrative   Not on file   Social Determinants of Health  Financial Resource Strain: Low Risk  (12/22/2021)   Overall Financial Resource Strain (CARDIA)    Difficulty of Paying Living Expenses: Not very hard  Food Insecurity: No Food Insecurity (12/26/2021)   Hunger Vital Sign    Worried About Running Out of Food in the Last Year: Never true    Ran Out of Food in the Last Year: Never true  Transportation Needs: No Transportation Needs (12/26/2021)   PRAPARE - Administrator, Civil Service (Medical): No    Lack of Transportation (Non-Medical): No  Physical Activity: Not on file  Stress: Not on file  Social Connections: Not on file  Intimate Partner Violence: Not At Risk (12/21/2021)   Humiliation, Afraid, Rape, and Kick questionnaire    Fear of Current or Ex-Partner: No    Emotionally Abused: No    Physically Abused: No    Sexually Abused: No      Family History  Problem Relation Age of Onset   Cancer Mother    Heart disease Father     Vitals:   01/10/22 1457  BP: (!) 144/104  Pulse: 77  SpO2: 98%  Weight: 84.9 kg (187 lb 3.2 oz)  Height: 6' (1.829 m)    PHYSICAL EXAM: ReDs 33%  General:  Well appearing. No respiratory difficulty HEENT: normal Neck: supple. no JVD. Carotids 2+ bilat; no bruits.  No lymphadenopathy or thryomegaly appreciated. Cor: PMI nondisplaced. Irregularly irregular rhythm and rate. 2/6 MR Lungs: clear Abdomen: soft, nontender, nondistended. No hepatosplenomegaly. No bruits or masses. Good bowel sounds. Extremities: no cyanosis, clubbing, rash, edema Neuro: alert & oriented x 3, cranial nerves grossly intact. moves all 4 extremities w/o difficulty. Affect pleasant.  ECG: Afib w/ RVR 139 bpm    ASSESSMENT & PLAN:  1. Chronic Systolic Heart Failure - suspect NICM, Had low EF in the past and normal LHC. EF recovered in 2020 - EF recently dropped again in setting of Afib w/ RVR. Echo 11/23 EF 20-25%. Suspect tachy mediated - NYHA Class II. Euvolemic and warm on exam. ReDs 33% - Continue Entresto 97-103 mg bid - Continue Farxiga 10 mg daily  - Continue Coreg 25 mg bid - Continue Digoxin 0.125 mg daily. Check Dig level  - Continue Lasix 40 mg daily  - Check BMP today  - mantanence of NSR imperative, though currently difficult in setting of Thyrotoxicosis  - pt educated on worrisome s/s of acute CHF exacerbation/ low output and instructed to seek emergency medical care if needed  - refer to the [facility]. Assign to Dr. Gasper Lloyd. F/u early next week    2. Persistent Atrial Fibrillation  - failed multiple DCCVs w/ ERAF  - failed amiodarone, discontinued due to hyperthyroidism  - exacerbated by hyperthyroidism and severe MR. Suspect etiology of systolic dysfunction  - maintenance of NSR is imperative. Agree that she needs to be seen by Afib clinic to discuss Tikosyn vs Afib Ablation (LA diam 5.0 cm) - continue Coreg and digoxin for rate control - continue treatment for thyrotoxicosis  - Eliquis for a/c  - refer to AFib clinic   3. Mitral Regurgitation  - mod-severe on recent echo - likely functional in setting of severely dilated LA/LV - HF + afib optimization per above   4. Amiodarone Induced Thyrotoxicosis  - amiodarone discontinued 9/23 - continue  methimazole + prednisone  - per endocrinology   NYHA II GDMT  Diuretic- Lasix 40 mg daily  BB- Coreg 25 mg bid  Ace/ARB/ARNI Entresto 97-103 mg bid  MRA spironolactone 12.5 mg daily  SGLT2i Farxiga 10 mg daily     Referred to HFSW (PCP, Medications, Transportation, ETOH Abuse, Drug Abuse, Insurance, Financial ): No  Refer to Pharmacy:  No Refer to Home Health: No Refer to Advanced Heart Failure Clinic: Yes (Dr. Gasper Lloyd) Refer to General Cardiology: Yes (Afib Clinic)   Follow up: Refer to Afib clinic, refer to Dr. Gasper Lloyd next week

## 2022-01-10 NOTE — Telephone Encounter (Signed)
Called to confirm Heart & Vascular Transitions of Care appointment at 3pm today. Patient reminded to bring all medications and pill box organizer with them. Confirmed patient has transportation. Gave directions, instructed to utilize valet parking.  Confirmed appointment prior to ending call.   Eutha Cude, MSN, RN Heart Failure Nurse Navigator  

## 2022-01-10 NOTE — Telephone Encounter (Signed)
Pt seen today by heart and vascular:  4. Amiodarone Induced Thyrotoxicosis  - amiodarone discontinued 9/23 - continue methimazole + prednisone  - per endocrinology   Will send refill for methimazole per request.

## 2022-01-15 ENCOUNTER — Ambulatory Visit: Payer: Medicare Other | Admitting: Nurse Practitioner

## 2022-01-15 ENCOUNTER — Other Ambulatory Visit (HOSPITAL_COMMUNITY): Payer: Self-pay

## 2022-01-15 ENCOUNTER — Encounter (HOSPITAL_COMMUNITY): Payer: Medicare Other | Admitting: Cardiology

## 2022-01-15 ENCOUNTER — Encounter (HOSPITAL_COMMUNITY): Payer: Self-pay | Admitting: Cardiology

## 2022-01-15 ENCOUNTER — Ambulatory Visit (HOSPITAL_COMMUNITY)
Admission: RE | Admit: 2022-01-15 | Discharge: 2022-01-15 | Disposition: A | Discharge status: Home / Self Care | Payer: Medicare Other | Source: Ambulatory Visit | Attending: Cardiology | Admitting: Cardiology

## 2022-01-15 VITALS — BP 112/78 | HR 82 | Wt 187.2 lb

## 2022-01-15 DIAGNOSIS — E0590 Thyrotoxicosis, unspecified without thyrotoxic crisis or storm: Secondary | ICD-10-CM | POA: Diagnosis not present

## 2022-01-15 DIAGNOSIS — I110 Hypertensive heart disease with heart failure: Secondary | ICD-10-CM | POA: Diagnosis not present

## 2022-01-15 DIAGNOSIS — I4891 Unspecified atrial fibrillation: Secondary | ICD-10-CM

## 2022-01-15 DIAGNOSIS — Z7984 Long term (current) use of oral hypoglycemic drugs: Secondary | ICD-10-CM | POA: Insufficient documentation

## 2022-01-15 DIAGNOSIS — I340 Nonrheumatic mitral (valve) insufficiency: Secondary | ICD-10-CM | POA: Diagnosis not present

## 2022-01-15 DIAGNOSIS — T462X1S Poisoning by other antidysrhythmic drugs, accidental (unintentional), sequela: Secondary | ICD-10-CM

## 2022-01-15 DIAGNOSIS — I5022 Chronic systolic (congestive) heart failure: Secondary | ICD-10-CM | POA: Diagnosis not present

## 2022-01-15 DIAGNOSIS — I428 Other cardiomyopathies: Secondary | ICD-10-CM | POA: Diagnosis not present

## 2022-01-15 DIAGNOSIS — R0600 Dyspnea, unspecified: Secondary | ICD-10-CM | POA: Diagnosis not present

## 2022-01-15 DIAGNOSIS — Z79899 Other long term (current) drug therapy: Secondary | ICD-10-CM | POA: Diagnosis not present

## 2022-01-15 DIAGNOSIS — I4819 Other persistent atrial fibrillation: Secondary | ICD-10-CM | POA: Insufficient documentation

## 2022-01-15 LAB — BASIC METABOLIC PANEL
Anion gap: 13 (ref 5–15)
BUN: 28 mg/dL — ABNORMAL HIGH (ref 8–23)
CO2: 21 mmol/L — ABNORMAL LOW (ref 22–32)
Calcium: 9.5 mg/dL (ref 8.9–10.3)
Chloride: 106 mmol/L (ref 98–111)
Creatinine, Ser: 1.15 mg/dL — ABNORMAL HIGH (ref 0.44–1.00)
GFR, Estimated: 54 mL/min — ABNORMAL LOW (ref >60)
Glucose, Bld: 123 mg/dL — ABNORMAL HIGH (ref 70–99)
Potassium: 3.8 mmol/L (ref 3.5–5.1)
Sodium: 140 mmol/L (ref 135–145)

## 2022-01-15 LAB — BRAIN NATRIURETIC PEPTIDE: B Natriuretic Peptide: 113.2 pg/mL — ABNORMAL HIGH (ref 0.0–100.0)

## 2022-01-15 MED ORDER — DAPAGLIFLOZIN PROPANEDIOL 10 MG PO TABS: 10.0000 mg | ORAL_TABLET | Freq: Every day | ORAL | 11 refills | Status: DC

## 2022-01-15 MED ORDER — FUROSEMIDE 40 MG PO TABS: 40.0000 mg | ORAL_TABLET | Freq: Every day | ORAL | 3 refills | Status: DC

## 2022-01-15 NOTE — Progress Notes (Signed)
Medication Samples have been provided to the patient.  Drug name: [name]       Strength: 10 mg        Qty: 2  LOT: [id number]  Exp.Date: 07/05/2024  Dosing instructions: Take 1 tablet daily  The patient has been instructed regarding the correct time, dose, and frequency of taking this medication, including desired effects and most common side effects.   Joanna [name] 12:33 PM 01/15/2022

## 2022-01-15 NOTE — Progress Notes (Signed)
ADVANCED HEART FAILURE CLINIC NOTE  Referring Physician: Marcine Matar, MD  Primary Care: Joanna Matar, MD Primary Cardiologist: Joanna [name]  HPI: Joanna [name] is a 61 y.o. female with hypertension, long-term persistent atrial fibrillation and nonischemic cardiomyopathy with EF as low as 20 to 25% presenting today to establish care. Joanna [name] reports being diagnosed with heart failure for at least 10 years now with cath in 2007 demonstrating normal coronaries. She has had several admissions at [facility] dating back to 2020 when she required intubation for hypertensive urgency associated with pulmonary edema and atrial fibrillation. 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 During that admission, TTE w/ LVEF 25%-30% & severely dilated LA w/ severe eccentric MR. Since that time she has been seen in our [facility] clinic with addition of some GDMT.   Interval hx:  Since her last appointment she reports continued improvement in dyspnea while at rest. Unfortunately with excessive exertion she quickly becomes dyspneic. Due to fear of dyspnea she has remained at home for the past several weeks. She reports compliance with her medications.   Activity level/exercise tolerance:  NYHA IIB-III Orthopnea:  Sleeps on 2-3 pillows Paroxysmal noctural dyspnea:  Infrequent Chest pain/pressure:  No Orthostatic lightheadedness:  No Palpitations:  Yes Lower extremity edema:  Minimal Presyncope/syncope:  No Cough:  No  Past Medical History:  Diagnosis Date   Atrial fibrillation with RVR (HCC)    Chronic combined systolic (congestive) and diastolic (congestive) heart failure (HCC)    Dyspnea    Goiter    Hypertension    NICM (nonischemic cardiomyopathy) (HCC)    a. 08/2005 Echo: EF 30-35%, mod diff HK. Mild to Mod MR. Mildly dil LA; b. 08/2005 Cath: Nl Cors. Elevated  CO w/o shunt; c. 09/2007 Echo: EF 45%. Mild to mod MR; c. 03/2015 Echo: EF 45%, global HK. Gr1 DD. Mild MR. Mildly dil LA.    Current Outpatient Medications  Medication Sig Dispense Refill   carvedilol (COREG) 25 MG tablet Take 1 tablet (25 mg total) by mouth 2 (two) times daily with a meal. 60 tablet 0   digoxin (LANOXIN) 0.125 MG tablet Take 1 tablet (0.125 mg total) by mouth daily. 30 tablet 0   ELIQUIS 5 MG TABS tablet TAKE 1 TABLET BY MOUTH TWICE  DAILY 200 tablet 2   ENTRESTO 97-103 MG TAKE 1 TABLET BY MOUTH TWICE  DAILY 200 tablet 2   methimazole (TAPAZOLE) 5 MG tablet TAKE 1 TABLET(5 MG) BY MOUTH DAILY 90 tablet 1   spironolactone (ALDACTONE) 25 MG tablet Take 0.5 tablets (12.5 mg total) by mouth daily. 45 tablet 3   dapagliflozin propanediol (FARXIGA) 10 MG TABS tablet Take 1 tablet (10 mg total) by mouth daily. 90 tablet 11   furosemide (LASIX) 40 MG tablet Take 1 tablet (40 mg total) by mouth daily. 90 tablet 3   No current facility-administered medications for this encounter.    No Known Allergies    Social History   Socioeconomic History   Marital status: Single    Spouse name: Not on file   Number of children: 1   Years of education: Not on file   Highest education level: High school graduate  Occupational History   Occupation: unemployed    Comment: on disablity  Tobacco Use   Smoking status: Former    Packs/day: 2.00    Years: 30.00  Total pack years: 60.00    Types: Cigarettes    Quit date: 02/08/2018    Years since quitting: 3.9   Smokeless tobacco: Never  Vaping Use   Vaping Use: Never used  Substance and Sexual Activity   Alcohol use: No    Comment: quit 2 years ago   Drug use: No   Sexual activity: Not on file  Other Topics Concern   Not on file  Social History Narrative   Not on file   Social Determinants of Health   Financial Resource Strain: Low Risk  (12/22/2021)   Overall Financial Resource Strain (CARDIA)    Difficulty of Paying Living  Expenses: Not very hard  Food Insecurity: No Food Insecurity (12/26/2021)   Hunger Vital Sign    Worried About Running Out of Food in the Last Year: Never true    Ran Out of Food in the Last Year: Never true  Transportation Needs: No Transportation Needs (12/26/2021)   PRAPARE - Administrator, Civil Service (Medical): No    Lack of Transportation (Non-Medical): No  Physical Activity: Not on file  Stress: Not on file  Social Connections: Not on file  Intimate Partner Violence: Not At Risk (12/21/2021)   Humiliation, Afraid, Rape, and Kick questionnaire    Fear of Current or Ex-Partner: No    Emotionally Abused: No    Physically Abused: No    Sexually Abused: No      Family History  Problem Relation Age of Onset   Cancer Mother    Heart disease Father     PHYSICAL EXAM: Vitals:   01/15/22 1158  BP: 112/78  Pulse: 82  SpO2: 98%   GENERAL: Thin AAF in NAD HEENT: Negative for arcus senilis or xanthelasma. There is no scleral icterus.  The mucous membranes are pink and moist.   NECK: Supple, No masses. Normal carotid upstrokes without bruits. No masses or thyromegaly.    CHEST: There are no chest wall deformities. There is no chest wall tenderness. Respirations are unlabored.  Lungs- CTA B/L CARDIAC:  JVP: <8 cm H2O         Normal S1, S2  Normal rate with regular rhythm. 2/6 SEM murmur, rubs or gallops.  Pulses are 2+ and symmetrical in upper and lower extremities. no edema.  ABDOMEN: Soft, non-tender, non-distended. There are no masses or hepatomegaly. There are normal bowel sounds.  EXTREMITIES: Warm and well perfused with no cyanosis, clubbing.  LYMPHATIC: No axillary or supraclavicular lymphadenopathy.  NEUROLOGIC: Patient is oriented x3 with no focal or lateralizing neurologic deficits.  PSYCH: Patients affect is appropriate, there is no evidence of anxiety or depression.  SKIN: Warm and dry; no lesions or wounds.   DATA REVIEW  KPV:[id number]  fibrillation  ECHO: 12/21/21: LVEF 25% to 30%. Severe eccentric MR.  10/04/20: LVEF 55% - 60%  CATH:Pending   ASSESSMENT & PLAN:  NYHA IIB, Stage C-D, Systolic Heart Failure Etiology of MB:EMLJQG nonischemic from persistent atrial fibrillation NYHA class / AHA Stage:IIB Volume status & Diuretics: Euvolemic, continue lasix 40mg  daily Vasodilators:Entresto 97/103mg  BID Beta-Blocker:Coreg 25mg  BID 12.5mg  daily Cardiometabolic:start farxiga 10mg  daily Devices therapies & Valvulopathies:Will uptitrate medical therapy, attempt for rhythm control and repeat TTE in 2-43m Advanced therapies:N/A  2. Severe mitral regurgitation  - Severe LA enlargement, likely atrial functional MR - Plan for TEE/LHC and evaluation for TEER after discussion with structural team.   3. Persistent atrial fibrillation  - Refer to afib clinic; I believe she will  have very difficult to control AF given severity of LA enlargement and chronicity of atrial fibrillation at this point.   4. Amiodarone induced thyrotoxicosis - Follow with endocrinology  - Off amiodarone since 10/16/21 - s/p methimazole & prednisone  [facility] Advanced Heart Failure Mechanical Circulatory Support

## 2022-01-15 NOTE — Patient Instructions (Signed)
There has been no changes to your medications.  Labs done today, your results will be available in MyChart, we will contact you for abnormal readings.  You are scheduled for a TEE (Transesophageal Echocardiogram) on Monday, January 22 with Dr. Gasper Lloyd.  Please arrive at the [facility] (Main Entrance A) at [facility]: [address] [place], [place] [address] at 8:00 AM.    DIET:  Nothing to eat or drink after midnight except a sip of water with medications (see medication instructions below)  MEDICATION INSTRUCTIONS: Hold Farxiga, Lasix and Spironolactone on the morning of Monday January 22nd   Do not take  Eliquis from Saturday January 20th till after your procedure   Take all morning medication not listed above the morning of procedure.  FYI:  For your safety, and to allow [place] to monitor your vital signs accurately during the surgery/procedure we request: If you have artificial nails, gel coating, SNS etc, please have those removed prior to your surgery/procedure. Not having the nail coverings /polish removed may result in cancellation or delay of your surgery/procedure.  You must have a responsible person to drive you home and stay in the waiting area during your procedure. Failure to do so could result in cancellation.  Bring your insurance cards.  *Special Note: Every effort is made to have your procedure done on time. Occasionally there are emergencies that occur at the hospital that may cause delays. Please be patient if a delay does occur.   Your physician recommends that you schedule a follow-up appointment will be scheduled after your procedures   If you have any questions or concerns before your next appointment please send [place] a message through [place] or call our office at [phone].    TO LEAVE A MESSAGE FOR THE NURSE SELECT OPTION 2, PLEASE LEAVE A MESSAGE INCLUDING: YOUR NAME DATE OF BIRTH CALL BACK NUMBER REASON FOR CALL**this is important as we  prioritize the call backs  YOU WILL RECEIVE A CALL BACK THE SAME DAY AS LONG AS YOU CALL BEFORE 4:00 PM  At the Advanced Heart Failure Clinic, you and your health needs are our priority. As part of our continuing mission to provide you with exceptional heart care, we have created designated Provider Care Teams. These Care Teams include your primary Cardiologist (physician) and Advanced Practice Providers (APPs- Physician Assistants and Nurse Practitioners) who all work together to provide you with the care you need, when you need it.   You may see any of the following providers on your designated Care Team at your next follow up: Dr Arvilla Meres Dr Marca Ancona Dr. Marcos Eke, NP Robbie Lis, [place] [facility] [place], [place] Brynda Peon, NP Karle Plumber, PharmD   Please be sure to bring in all your medications bottles to every appointment.

## 2022-01-16 ENCOUNTER — Telehealth (HOSPITAL_COMMUNITY): Payer: Self-pay | Admitting: *Deleted

## 2022-01-16 ENCOUNTER — Ambulatory Visit (HOSPITAL_COMMUNITY)
Admission: RE | Admit: 2022-01-16 | Discharge: 2022-01-16 | Disposition: A | Discharge status: Home / Self Care | Payer: Medicare Other | Source: Ambulatory Visit | Attending: Physician Assistant | Admitting: Physician Assistant

## 2022-01-16 ENCOUNTER — Encounter (HOSPITAL_COMMUNITY): Payer: Self-pay | Admitting: Physician Assistant

## 2022-01-16 VITALS — BP 102/90 | HR 71 | Ht 72.0 in | Wt 186.6 lb

## 2022-01-16 DIAGNOSIS — D6869 Other thrombophilia: Secondary | ICD-10-CM | POA: Insufficient documentation

## 2022-01-16 DIAGNOSIS — I38 Endocarditis, valve unspecified: Secondary | ICD-10-CM | POA: Insufficient documentation

## 2022-01-16 DIAGNOSIS — Z7901 Long term (current) use of anticoagulants: Secondary | ICD-10-CM | POA: Diagnosis not present

## 2022-01-16 DIAGNOSIS — I4819 Other persistent atrial fibrillation: Secondary | ICD-10-CM | POA: Insufficient documentation

## 2022-01-16 DIAGNOSIS — Z720 Tobacco use: Secondary | ICD-10-CM | POA: Insufficient documentation

## 2022-01-16 DIAGNOSIS — I110 Hypertensive heart disease with heart failure: Secondary | ICD-10-CM | POA: Insufficient documentation

## 2022-01-16 DIAGNOSIS — I5042 Chronic combined systolic (congestive) and diastolic (congestive) heart failure: Secondary | ICD-10-CM | POA: Insufficient documentation

## 2022-01-16 NOTE — Progress Notes (Signed)
Primary Care Physician: Marcine Matar, MD Primary Cardiologist: Dr Elease Hashimoto Primary Electrophysiologist: Dr Elberta Fortis [facility]: Dr Gasper Lloyd Referring Physician: Manson Passey PA   Joanna [name] is a 61 y.o. female with a history of HTN, combined chronic systolic and diastolic CHF, tobacco abuse, MR, and persistent atrial fibrillation who presents for follow up in the [facility]rial Fibr[facility].  The patient was initially diagnosed with atrial fibrillation on 03/08/18 at the ER after presenting with symptoms of dyspnea on exertion for one week prior. ECG at ER showed afib with RVR and she was given Lopressor and started on Cardizem drip and heparin. She underwent successful DCCV. Echo on admission showed severely reduced EF 20-25%, possibly tachycardia mediated. Discharged on Eliquis and Lopressor. On follow up, she was noted to be in afib again with RVR. Started on amiodarone which did well keeping her in SR. Unfortunately, she developed amiodarone induced hyperthyroidism and this was discontinued 10/16/21.  Patient was hospitalized 12/20/21 with acute CHF in the setting of rapid Afib. Echo showed EF back down again, 25-30% w/ global HK, LV mod-severely dilated, LA severely dilated w/ eccentric MR, posteriorly directed into LA, severe regurgitation. She was diuresed w/ IV Lasix and transitioned to PO with additions of Farxiga and spironolactone. Treated w/ Coreg and digoxin for rate control.   On follow up today, patient reports that she feels well today. She remains in rate controlled afib. She denies tachypalpitations or lower extremity edema.   Today, she denies symptoms of chest pain, orthopnea, PND, lower extremity edema, dizziness, presyncope, syncope, snoring, daytime somnolence, bleeding, or neurologic sequela. The patient is tolerating medications without difficulties and is otherwise without complaint today.    Atrial Fibrillation Risk Factors:  she does not have  symptoms or diagnosis of sleep apnea. she does not have a history of rheumatic fever. she does not have a history of alcohol use. The patient does not have a history of early familial atrial fibrillation or other arrhythmias.  she has a BMI of Body mass index is 25.31 kg/m.Marland Kitchen Filed Weights   01/16/22 1424  Weight: 84.6 kg    Family History  Problem Relation Age of Onset   Cancer Mother    Heart disease Father      Atrial Fibrillation Management history:  Previous antiarrhythmic drugs: amiodarone  Previous cardioversions: 03/14/2018 Previous ablations: none CHADS2VASC score: 3  Anticoagulation history: Eliquis   Past Medical History:  Diagnosis Date   Atrial fibrillation with RVR (HCC)    Chronic combined systolic (congestive) and diastolic (congestive) heart failure (HCC)    Dyspnea    Goiter    Hypertension    NICM (nonischemic cardiomyopathy) (HCC)    a. 08/2005 Echo: EF 30-35%, mod diff HK. Mild to Mod MR. Mildly dil LA; b. 08/2005 Cath: Nl Cors. Elevated CO w/o shunt; c. 09/2007 Echo: EF 45%. Mild to mod MR; c. 03/2015 Echo: EF 45%, global HK. Gr1 DD. Mild MR. Mildly dil LA.   Past Surgical History:  Procedure Laterality Date   APPENDECTOMY     BACK SURGERY     CARDIOVERSION N/A 03/14/2018   Procedure: CARDIOVERSION;  Surgeon: Pricilla Riffle, MD;  Location: [facility] ENDOSCOPY;  Service: Cardiovascular;  Laterality: N/A;   TEE WITHOUT CARDIOVERSION N/A 03/14/2018   Procedure: TRANSESOPHAGEAL ECHOCARDIOGRAM (TEE);  Surgeon: Pricilla Riffle, MD;  Location: [facility] ENDOSCOPY;  Service: Cardiovascular;  Laterality: N/A;    Current Outpatient Medications  Medication Sig Dispense Refill   carvedilol (COREG) 25 MG  tablet Take 1 tablet (25 mg total) by mouth 2 (two) times daily with a meal. 60 tablet 0   dapagliflozin propanediol (FARXIGA) 10 MG TABS tablet Take 1 tablet (10 mg total) by mouth daily. 90 tablet 11   digoxin (LANOXIN) 0.125 MG tablet Take 1 tablet (0.125 mg total) by mouth daily.  30 tablet 0   ELIQUIS 5 MG TABS tablet TAKE 1 TABLET BY MOUTH TWICE  DAILY 200 tablet 2   ENTRESTO 97-103 MG TAKE 1 TABLET BY MOUTH TWICE  DAILY 200 tablet 2   furosemide (LASIX) 40 MG tablet Take 1 tablet (40 mg total) by mouth daily. 90 tablet 3   methimazole (TAPAZOLE) 5 MG tablet TAKE 1 TABLET(5 MG) BY MOUTH DAILY 90 tablet 1   spironolactone (ALDACTONE) 25 MG tablet Take 0.5 tablets (12.5 mg total) by mouth daily. 45 tablet 3   No current facility-administered medications for this encounter.    No Known Allergies   Social History   Socioeconomic History   Marital status: Single    Spouse name: Not on file   Number of children: 1   Years of education: Not on file   Highest education level: High school graduate  Occupational History   Occupation: unemployed    Comment: on disablity  Tobacco Use   Smoking status: Former    Packs/day: 2.00    Years: 30.00    Total pack years: 60.00    Types: Cigarettes    Quit date: 02/08/2018    Years since quitting: 3.9   Smokeless tobacco: Never   Tobacco comments:    Former smoker 01/16/22  Vaping Use   Vaping Use: Never used  Substance and Sexual Activity   Alcohol use: No    Comment: quit 2 years ago   Drug use: No   Sexual activity: Not on file  Other Topics Concern   Not on file  Social History Narrative   Not on file   Social Determinants of Health   Financial Resource Strain: Low Risk  (12/22/2021)   Overall Financial Resource Strain (CARDIA)    Difficulty of Paying Living Expenses: Not very hard  Food Insecurity: No Food Insecurity (12/26/2021)   Hunger Vital Sign    Worried About Running Out of Food in the Last Year: Never true    Ran Out of Food in the Last Year: Never true  Transportation Needs: No Transportation Needs (12/26/2021)   PRAPARE - Administrator, Civil Service (Medical): No    Lack of Transportation (Non-Medical): No  Physical Activity: Not on file  Stress: Not on file  Social  Connections: Not on file  Intimate Partner Violence: Not At Risk (12/21/2021)   Humiliation, Afraid, Rape, and Kick questionnaire    Fear of Current or Ex-Partner: No    Emotionally Abused: No    Physically Abused: No    Sexually Abused: No     ROS- All systems are reviewed and negative except as per the HPI above.  Physical Exam: Vitals:   01/16/22 1424  BP: (!) 102/90  Pulse: 71  Weight: 84.6 kg  Height: 6' (1.829 m)    GEN- The patient is a well appearing female, alert and oriented x 3 today.   HEENT-head normocephalic, atraumatic, sclera clear, conjunctiva pink, hearing intact, trachea midline. Lungs- Clear to ausculation bilaterally, normal work of breathing Heart- irregular rate and rhythm, no rubs or gallops, 2/6 systolic murmur   GI- soft, NT, ND, + BS Extremities- no clubbing,  cyanosis, or edema MS- no significant deformity or atrophy Skin- no rash or lesion Psych- euthymic mood, full affect Neuro- strength and sensation are intact   Wt Readings from Last 3 Encounters:  01/16/22 84.6 kg  01/15/22 84.9 kg  01/10/22 84.9 kg    EKG today demonstrates Afib Vent. rate 71 BPM PR interval * ms QRS duration 96 ms QT/QTcB 314/341 ms  Echo 12/21/21 demonstrated   1. No significant change from echo done in 2020.   2. Left ventricular ejection fraction, by estimation, is 25 to 30%. The  left ventricle has severely decreased function. The left ventricle  demonstrates global hypokinesis. The left ventricular internal cavity size  was moderately to severely dilated.  There is mild left ventricular hypertrophy. Left ventricular diastolic  parameters are indeterminate.   3. Right ventricular systolic function is low normal. The right  ventricular size is normal. There is moderately elevated pulmonary artery  systolic pressure.   4. Left atrial size was severely dilated.   5. MR is eccentric, posteriorly directed into LA . The mitral valve is  abnormal. Severe mitral  valve regurgitation.   6. Tricuspid valve regurgitation is mild to moderate.   7. The aortic valve is tricuspid. Aortic valve regurgitation is trivial.  Aortic valve sclerosis is present, with no evidence of aortic valve  stenosis.   8. The inferior vena cava is dilated in size with <50% respiratory  variability, suggesting right atrial pressure of 15 mmHg.   Epic records are reviewed at length today   CHA2DS2-VASc Score = 3  The patient's score is based upon: CHF History: 1 HTN History: 1 Diabetes History: 0 Stroke History: 0 Vascular Disease History: 0 Age Score: 0 Gender Score: 1       ASSESSMENT AND PLAN: 1. Persistent Atrial Fibrillation (ICD10:  I48.19) The patient's CHA2DS2-VASc score is 3, indicating a 3.2% annual risk of stroke.   Patient off amiodarone due to hyperthyroidism, on methimazole.  Her heart rate is well controlled today. We discussed rhythm control. Her AAD options are limited, not likely an ablation candidate with severely dilated LA. After discussing the risks and benefits, patient wants to pursue dofetilide. PharmD to screen medications QTc in SR 437 ms on ECG 10/12/21 Continue carvedilol 25 mg BID Continue digoxin 0.125 mg daily Continue Eliquis 5 mg BID Patient does state that this is the last time she would like to try AAD+ DCCV to get back in SR.   2. Secondary Hypercoagulable State (ICD10:  D68.69) The patient is at significant risk for stroke/thromboembolism based upon her CHA2DS2-VASc Score of 3.  Continue Apixaban (Eliquis).   3. Chronic systolic CHF Suspected tachycardia mediated.  Fluid status appears stable today. GDMT per [facility], followed by Dr Daniel Nones.  4. Valvular heart disease Severe MR TEE scheduled to evaluate per Dr Daniel Nones.   Follow up in the AF clinic next week for dofetilide loading.    [facility] [address] [place],  [address] [phone] 01/16/2022 2:44 PM

## 2022-01-17 ENCOUNTER — Telehealth: Payer: Self-pay | Admitting: Pharmacist

## 2022-01-17 NOTE — Telephone Encounter (Signed)
Medication list reviewed in anticipation of upcoming Tikosyn initiation. Patient is not taking any contraindicated or QTc prolonging medications. Amiodarone stopped > 3 months ago on 10/16/21, appropriate washout.  Patient is anticoagulated on Eliquis 5mg  BID on the appropriate dose. Please ensure that patient has not missed any anticoagulation doses in the 3 weeks prior to Tikosyn initiation.   Patient will need to be counseled to avoid use of Benadryl while on Tikosyn and in the 2-3 days prior to Tikosyn initiation.

## 2022-01-18 ENCOUNTER — Ambulatory Visit (HOSPITAL_COMMUNITY): Payer: Medicare Other | Admitting: Physician Assistant

## 2022-01-19 ENCOUNTER — Encounter (HOSPITAL_COMMUNITY): Payer: Self-pay

## 2022-01-23 ENCOUNTER — Encounter (HOSPITAL_COMMUNITY): Payer: Self-pay

## 2022-01-23 ENCOUNTER — Ambulatory Visit (HOSPITAL_COMMUNITY)
Admission: RE | Admit: 2022-01-23 | Discharge: 2022-01-23 | Disposition: A | Discharge status: Home / Self Care | Payer: Medicare Other | Source: Ambulatory Visit | Attending: Physician Assistant | Admitting: Physician Assistant

## 2022-01-23 ENCOUNTER — Encounter (HOSPITAL_COMMUNITY): Payer: Self-pay | Admitting: Physician Assistant

## 2022-01-23 VITALS — BP 104/74 | HR 90 | Ht 72.0 in | Wt 184.0 lb

## 2022-01-23 DIAGNOSIS — I5042 Chronic combined systolic (congestive) and diastolic (congestive) heart failure: Secondary | ICD-10-CM | POA: Diagnosis not present

## 2022-01-23 DIAGNOSIS — I38 Endocarditis, valve unspecified: Secondary | ICD-10-CM | POA: Insufficient documentation

## 2022-01-23 DIAGNOSIS — I340 Nonrheumatic mitral (valve) insufficiency: Secondary | ICD-10-CM | POA: Diagnosis not present

## 2022-01-23 DIAGNOSIS — I4819 Other persistent atrial fibrillation: Secondary | ICD-10-CM | POA: Insufficient documentation

## 2022-01-23 DIAGNOSIS — D6869 Other thrombophilia: Secondary | ICD-10-CM | POA: Insufficient documentation

## 2022-01-23 DIAGNOSIS — I5022 Chronic systolic (congestive) heart failure: Secondary | ICD-10-CM | POA: Insufficient documentation

## 2022-01-23 DIAGNOSIS — Z560 Unemployment, unspecified: Secondary | ICD-10-CM | POA: Diagnosis not present

## 2022-01-23 DIAGNOSIS — I428 Other cardiomyopathies: Secondary | ICD-10-CM | POA: Diagnosis not present

## 2022-01-23 DIAGNOSIS — Z720 Tobacco use: Secondary | ICD-10-CM | POA: Insufficient documentation

## 2022-01-23 DIAGNOSIS — I110 Hypertensive heart disease with heart failure: Secondary | ICD-10-CM | POA: Insufficient documentation

## 2022-01-23 DIAGNOSIS — Z7901 Long term (current) use of anticoagulants: Secondary | ICD-10-CM | POA: Insufficient documentation

## 2022-01-23 DIAGNOSIS — Z8249 Family history of ischemic heart disease and other diseases of the circulatory system: Secondary | ICD-10-CM | POA: Diagnosis not present

## 2022-01-23 DIAGNOSIS — Z79899 Other long term (current) drug therapy: Secondary | ICD-10-CM | POA: Diagnosis not present

## 2022-01-23 DIAGNOSIS — R001 Bradycardia, unspecified: Secondary | ICD-10-CM | POA: Diagnosis not present

## 2022-01-23 DIAGNOSIS — Z87891 Personal history of nicotine dependence: Secondary | ICD-10-CM | POA: Diagnosis not present

## 2022-01-23 LAB — BASIC METABOLIC PANEL
Anion gap: 10 (ref 5–15)
BUN: 12 mg/dL (ref 8–23)
CO2: 24 mmol/L (ref 22–32)
Calcium: 9.3 mg/dL (ref 8.9–10.3)
Chloride: 108 mmol/L (ref 98–111)
Creatinine, Ser: 0.98 mg/dL (ref 0.44–1.00)
GFR, Estimated: >60 mL/min (ref >60)
Glucose, Bld: 98 mg/dL (ref 70–99)
Potassium: 3.6 mmol/L (ref 3.5–5.1)
Sodium: 142 mmol/L (ref 135–145)

## 2022-01-23 LAB — MAGNESIUM: Magnesium: 1.9 mg/dL (ref 1.7–2.4)

## 2022-01-23 NOTE — Progress Notes (Signed)
Primary Care Physician: Marcine Matar, MD Primary Cardiologist: Dr Elease Hashimoto Primary Electrophysiologist: Dr Elberta Fortis [facility]: Dr Gasper Lloyd Referring Physician: Manson Passey PA   Joanna [name] is a 61 y.o. female with a history of HTN, combined chronic systolic and diastolic CHF, tobacco abuse, MR, and persistent atrial fibrillation who presents for follow up in the [facility] Atrial Fibrillation [facility].  The patient was initially diagnosed with atrial fibrillation on 03/08/18 at the ER after presenting with symptoms of dyspnea on exertion for one week prior. ECG at ER showed afib with RVR and she was given Lopressor and started on Cardizem drip and heparin. She underwent successful DCCV. Echo on admission showed severely reduced EF 20-25%, possibly tachycardia mediated. Discharged on Eliquis and Lopressor. On follow up, she was noted to be in afib again with RVR. Started on amiodarone which did well keeping her in SR. Unfortunately, she developed amiodarone induced hyperthyroidism and this was discontinued 10/16/21.  Patient was hospitalized 12/20/21 with acute CHF in the setting of rapid Afib. Echo showed EF back down again, 25-30% w/ global HK, LV mod-severely dilated, LA severely dilated w/ eccentric MR, posteriorly directed into LA, severe regurgitation. She was diuresed w/ IV Lasix and transitioned to PO with additions of Farxiga and spironolactone. Treated w/ Coreg and digoxin for rate control.   On follow up today, patient presents for dofetilide admission. She denies any missed doses of anticoagulation in the past 3 weeks. She remains in rate controlled afib.   Today, she denies symptoms of palpitations, chest pain, orthopnea, PND, lower extremity edema, dizziness, presyncope, syncope, snoring, daytime somnolence, bleeding, or neurologic sequela. The patient is tolerating medications without difficulties and is otherwise without complaint today.    Atrial Fibrillation Risk  Factors:  she does not have symptoms or diagnosis of sleep apnea. she does not have a history of rheumatic fever. she does not have a history of alcohol use. The patient does not have a history of early familial atrial fibrillation or other arrhythmias.  she has a BMI of Body mass index is 24.95 kg/m.Marland Kitchen Filed Weights   01/23/22 1049  Weight: 83.5 kg    Family History  Problem Relation Age of Onset   Cancer Mother    Heart disease Father      Atrial Fibrillation Management history:  Previous antiarrhythmic drugs: amiodarone  Previous cardioversions: 03/14/2018 Previous ablations: none CHADS2VASC score: 3  Anticoagulation history: Eliquis   Past Medical History:  Diagnosis Date   Atrial fibrillation with RVR (HCC)    Chronic combined systolic (congestive) and diastolic (congestive) heart failure (HCC)    Dyspnea    Goiter    Hypertension    NICM (nonischemic cardiomyopathy) (HCC)    a. 08/2005 Echo: EF 30-35%, mod diff HK. Mild to Mod MR. Mildly dil LA; b. 08/2005 Cath: Nl Cors. Elevated CO w/o shunt; c. 09/2007 Echo: EF 45%. Mild to mod MR; c. 03/2015 Echo: EF 45%, global HK. Gr1 DD. Mild MR. Mildly dil LA.   Past Surgical History:  Procedure Laterality Date   APPENDECTOMY     BACK SURGERY     CARDIOVERSION N/A 03/14/2018   Procedure: CARDIOVERSION;  Surgeon: Pricilla Riffle, MD;  Location: [facility] ENDOSCOPY;  Service: Cardiovascular;  Laterality: N/A;   TEE WITHOUT CARDIOVERSION N/A 03/14/2018   Procedure: TRANSESOPHAGEAL ECHOCARDIOGRAM (TEE);  Surgeon: Pricilla Riffle, MD;  Location: [facility] ENDOSCOPY;  Service: Cardiovascular;  Laterality: N/A;    Current Outpatient Medications  Medication Sig Dispense Refill  carvedilol (COREG) 25 MG tablet Take 1 tablet (25 mg total) by mouth 2 (two) times daily with a meal. 60 tablet 0   dapagliflozin propanediol (FARXIGA) 10 MG TABS tablet Take 1 tablet (10 mg total) by mouth daily. 90 tablet 11   digoxin (LANOXIN) 0.125 MG tablet Take 1 tablet  (0.125 mg total) by mouth daily. 30 tablet 0   ELIQUIS 5 MG TABS tablet TAKE 1 TABLET BY MOUTH TWICE  DAILY 200 tablet 2   ENTRESTO 97-103 MG TAKE 1 TABLET BY MOUTH TWICE  DAILY 200 tablet 2   furosemide (LASIX) 40 MG tablet Take 1 tablet (40 mg total) by mouth daily. 90 tablet 3   methimazole (TAPAZOLE) 5 MG tablet TAKE 1 TABLET(5 MG) BY MOUTH DAILY 90 tablet 1   spironolactone (ALDACTONE) 25 MG tablet Take 0.5 tablets (12.5 mg total) by mouth daily. 45 tablet 3   No current facility-administered medications for this encounter.    No Known Allergies   Social History   Socioeconomic History   Marital status: Single    Spouse name: Not on file   Number of children: 1   Years of education: Not on file   Highest education level: High school graduate  Occupational History   Occupation: unemployed    Comment: on disablity  Tobacco Use   Smoking status: Former    Packs/day: 2.00    Years: 30.00    Total pack years: 60.00    Types: Cigarettes    Quit date: 02/08/2018    Years since quitting: 3.9   Smokeless tobacco: Never   Tobacco comments:    Former smoker 01/16/22  Vaping Use   Vaping Use: Never used  Substance and Sexual Activity   Alcohol use: No    Comment: quit 2 years ago   Drug use: No   Sexual activity: Not on file  Other Topics Concern   Not on file  Social History Narrative   Not on file   Social Determinants of Health   Financial Resource Strain: Low Risk  (12/22/2021)   Overall Financial Resource Strain (CARDIA)    Difficulty of Paying Living Expenses: Not very hard  Food Insecurity: No Food Insecurity (12/26/2021)   Hunger Vital Sign    Worried About Running Out of Food in the Last Year: Never true    Ran Out of Food in the Last Year: Never true  Transportation Needs: No Transportation Needs (12/26/2021)   PRAPARE - Administrator, Civil Service (Medical): No    Lack of Transportation (Non-Medical): No  Physical Activity: Not on file   Stress: Not on file  Social Connections: Not on file  Intimate Partner Violence: Not At Risk (12/21/2021)   Humiliation, Afraid, Rape, and Kick questionnaire    Fear of Current or Ex-Partner: No    Emotionally Abused: No    Physically Abused: No    Sexually Abused: No     ROS- All systems are reviewed and negative except as per the HPI above.  Physical Exam: Vitals:   01/23/22 1049  BP: 104/74  Pulse: 90  Weight: 83.5 kg  Height: 6' (1.829 m)     GEN- The patient is a well appearing female, alert and oriented x 3 today.   HEENT-head normocephalic, atraumatic, sclera clear, conjunctiva pink, hearing intact, trachea midline. Lungs- Clear to ausculation bilaterally, normal work of breathing Heart- irregular rate and rhythm, no rubs or gallops, 2/6 systolic murmur  GI- soft, NT, ND, + BS  Extremities- no clubbing, cyanosis, or edema MS- no significant deformity or atrophy Skin- no rash or lesion Psych- euthymic mood, full affect Neuro- strength and sensation are intact   Wt Readings from Last 3 Encounters:  01/23/22 83.5 kg  01/16/22 84.6 kg  01/15/22 84.9 kg    EKG today demonstrates Afib, NST Vent. rate 90 BPM PR interval * ms QRS duration 92 ms QT/QTcB 334/408 ms  Echo 12/21/21 demonstrated   1. No significant change from echo done in 2020.   2. Left ventricular ejection fraction, by estimation, is 25 to 30%. The  left ventricle has severely decreased function. The left ventricle  demonstrates global hypokinesis. The left ventricular internal cavity size  was moderately to severely dilated.  There is mild left ventricular hypertrophy. Left ventricular diastolic  parameters are indeterminate.   3. Right ventricular systolic function is low normal. The right  ventricular size is normal. There is moderately elevated pulmonary artery  systolic pressure.   4. Left atrial size was severely dilated.   5. MR is eccentric, posteriorly directed into LA . The mitral  valve is  abnormal. Severe mitral valve regurgitation.   6. Tricuspid valve regurgitation is mild to moderate.   7. The aortic valve is tricuspid. Aortic valve regurgitation is trivial.  Aortic valve sclerosis is present, with no evidence of aortic valve  stenosis.   8. The inferior vena cava is dilated in size with <50% respiratory  variability, suggesting right atrial pressure of 15 mmHg.   Epic records are reviewed at length today   CHA2DS2-VASc Score = 3  The patient's score is based upon: CHF History: 1 HTN History: 1 Diabetes History: 0 Stroke History: 0 Vascular Disease History: 0 Age Score: 0 Gender Score: 1       ASSESSMENT AND PLAN: 1. Persistent Atrial Fibrillation (ICD10:  I48.19) The patient's CHA2DS2-VASc score is 3, indicating a 3.2% annual risk of stroke.   Patient off amiodarone due to hyperthyroidism, on methimazole.  Patient presents for dofetilide admission.  Continue Eliquis 5 mg BID, states no missed doses in the last 3 weeks. No recent benadryl use PharmD has screened medications QTc in SR 437 ms on ECG 10/12/21 Labs today show creatinine at 0.98, K+ 3.6 and mag 1.9, CrCl calculated at 78 mL/min Continue carvedilol 25 mg BID Continue digoxin 0.125 mg daily Patient does state that this is the last time she would like to try AAD+ DCCV to get back in SR.   2. Secondary Hypercoagulable State (ICD10:  D68.69) The patient is at significant risk for stroke/thromboembolism based upon her CHA2DS2-VASc Score of 3.  Continue Apixaban (Eliquis).   3. Chronic systolic CHF Suspected tachycardia mediated.  GDMT per [facility], followed by Dr Gasper Lloyd. Fluid status appears stable today.  4. Valvular heart disease Severe MR TEE scheduled to evaluate per Dr Gasper Lloyd.   To be admitted later today once a bed becomes available.    Jorja Loa PA-C [facility] [address] [place], [place] [address] [phone] 01/23/2022 1:12 PM

## 2022-01-24 ENCOUNTER — Inpatient Hospital Stay (HOSPITAL_COMMUNITY)
Admission: RE | Admit: 2022-01-24 | Discharge: 2022-01-27 | DRG: 309 | Disposition: A | Discharge status: Home / Self Care | Payer: Medicare Other | Attending: Cardiology | Admitting: Cardiology

## 2022-01-24 ENCOUNTER — Other Ambulatory Visit (HOSPITAL_COMMUNITY): Payer: Self-pay

## 2022-01-24 DIAGNOSIS — Z560 Unemployment, unspecified: Secondary | ICD-10-CM

## 2022-01-24 DIAGNOSIS — Z87891 Personal history of nicotine dependence: Secondary | ICD-10-CM

## 2022-01-24 DIAGNOSIS — I5042 Chronic combined systolic (congestive) and diastolic (congestive) heart failure: Secondary | ICD-10-CM | POA: Diagnosis present

## 2022-01-24 DIAGNOSIS — Z7901 Long term (current) use of anticoagulants: Secondary | ICD-10-CM

## 2022-01-24 DIAGNOSIS — Z79899 Other long term (current) drug therapy: Secondary | ICD-10-CM

## 2022-01-24 DIAGNOSIS — I10 Essential (primary) hypertension: Secondary | ICD-10-CM | POA: Diagnosis present

## 2022-01-24 DIAGNOSIS — I110 Hypertensive heart disease with heart failure: Secondary | ICD-10-CM | POA: Diagnosis present

## 2022-01-24 DIAGNOSIS — I428 Other cardiomyopathies: Secondary | ICD-10-CM | POA: Diagnosis present

## 2022-01-24 DIAGNOSIS — D6869 Other thrombophilia: Secondary | ICD-10-CM | POA: Diagnosis present

## 2022-01-24 DIAGNOSIS — R001 Bradycardia, unspecified: Secondary | ICD-10-CM | POA: Diagnosis present

## 2022-01-24 DIAGNOSIS — Z8249 Family history of ischemic heart disease and other diseases of the circulatory system: Secondary | ICD-10-CM | POA: Diagnosis not present

## 2022-01-24 DIAGNOSIS — E064 Drug-induced thyroiditis: Secondary | ICD-10-CM | POA: Diagnosis not present

## 2022-01-24 DIAGNOSIS — I4819 Other persistent atrial fibrillation: Principal | ICD-10-CM | POA: Diagnosis present

## 2022-01-24 DIAGNOSIS — I5033 Acute on chronic diastolic (congestive) heart failure: Secondary | ICD-10-CM | POA: Diagnosis not present

## 2022-01-24 DIAGNOSIS — I340 Nonrheumatic mitral (valve) insufficiency: Secondary | ICD-10-CM | POA: Diagnosis present

## 2022-01-24 DIAGNOSIS — I480 Paroxysmal atrial fibrillation: Secondary | ICD-10-CM | POA: Diagnosis not present

## 2022-01-24 DIAGNOSIS — J449 Chronic obstructive pulmonary disease, unspecified: Secondary | ICD-10-CM | POA: Diagnosis not present

## 2022-01-24 LAB — BASIC METABOLIC PANEL
Anion gap: 9 (ref 5–15)
Anion gap: 13 (ref 5–15)
BUN: 12 mg/dL (ref 8–23)
BUN: 16 mg/dL (ref 8–23)
CO2: 25 mmol/L (ref 22–32)
CO2: 24 mmol/L (ref 22–32)
Calcium: 9.1 mg/dL (ref 8.9–10.3)
Calcium: 9.2 mg/dL (ref 8.9–10.3)
Chloride: 108 mmol/L (ref 98–111)
Chloride: 104 mmol/L (ref 98–111)
Creatinine, Ser: 0.93 mg/dL (ref 0.44–1.00)
Creatinine, Ser: 1.15 mg/dL — ABNORMAL HIGH (ref 0.44–1.00)
GFR, Estimated: >60 mL/min (ref >60)
GFR, Estimated: 54 mL/min — ABNORMAL LOW (ref >60)
Glucose, Bld: 87 mg/dL (ref 70–99)
Glucose, Bld: 92 mg/dL (ref 70–99)
Potassium: 3.5 mmol/L (ref 3.5–5.1)
Potassium: 4.9 mmol/L (ref 3.5–5.1)
Sodium: 142 mmol/L (ref 135–145)
Sodium: 141 mmol/L (ref 135–145)

## 2022-01-24 MED ORDER — SPIRONOLACTONE 12.5 MG HALF TABLET
12.5000 mg | ORAL_TABLET | Freq: Every day | ORAL | Status: DC
  Administered 2022-01-25 – 2022-01-27 (×3): 12.5 mg via ORAL
  Filled 2022-01-24 (×3): qty 1

## 2022-01-24 MED ORDER — APIXABAN 5 MG PO TABS
5.0000 mg | ORAL_TABLET | Freq: Two times a day (BID) | ORAL | Status: DC
  Administered 2022-01-24 – 2022-01-27 (×6): 5 mg via ORAL
  Filled 2022-01-24 (×6): qty 1

## 2022-01-24 MED ORDER — DOFETILIDE 500 MCG PO CAPS
500.0000 ug | ORAL_CAPSULE | Freq: Two times a day (BID) | ORAL | Status: DC
  Administered 2022-01-24 – 2022-01-27 (×6): 500 ug via ORAL
  Filled 2022-01-24 (×8): qty 1

## 2022-01-24 MED ORDER — SODIUM CHLORIDE 0.9% FLUSH: 3.0000 mL | INTRAVENOUS | Status: DC | PRN

## 2022-01-24 MED ORDER — DOFETILIDE 500 MCG PO CAPS: 500.0000 ug | ORAL_CAPSULE | Freq: Two times a day (BID) | ORAL | Status: DC

## 2022-01-24 MED ORDER — MAGNESIUM SULFATE 2 GM/50ML IV SOLN: 2.0000 g | Freq: Once | INTRAVENOUS | Status: DC

## 2022-01-24 MED ORDER — FUROSEMIDE 40 MG PO TABS
40.0000 mg | ORAL_TABLET | Freq: Every day | ORAL | Status: DC
  Administered 2022-01-25 – 2022-01-27 (×3): 40 mg via ORAL
  Filled 2022-01-24 (×3): qty 1

## 2022-01-24 MED ORDER — DIGOXIN 125 MCG PO TABS
0.1250 mg | ORAL_TABLET | Freq: Every day | ORAL | Status: DC
  Administered 2022-01-25: 0.125 mg via ORAL
  Filled 2022-01-24: qty 1

## 2022-01-24 MED ORDER — DAPAGLIFLOZIN PROPANEDIOL 10 MG PO TABS
10.0000 mg | ORAL_TABLET | Freq: Every day | ORAL | Status: DC
  Administered 2022-01-25 – 2022-01-27 (×3): 10 mg via ORAL
  Filled 2022-01-24 (×3): qty 1

## 2022-01-24 MED ORDER — SODIUM CHLORIDE 0.9 % IV SOLN
250.0000 mL | INTRAVENOUS | Status: DC | PRN
  Administered 2022-01-26: 250 mL via INTRAVENOUS

## 2022-01-24 MED ORDER — SPIRONOLACTONE 25 MG PO TABS: 25.0000 mg | ORAL_TABLET | Freq: Every day | ORAL | Status: DC

## 2022-01-24 MED ORDER — POTASSIUM CHLORIDE CRYS ER 20 MEQ PO TBCR
60.0000 meq | EXTENDED_RELEASE_TABLET | Freq: Once | ORAL | Status: AC
  Administered 2022-01-24: 60 meq via ORAL
  Filled 2022-01-24: qty 3

## 2022-01-24 MED ORDER — METHIMAZOLE 5 MG PO TABS
5.0000 mg | ORAL_TABLET | Freq: Every day | ORAL | Status: DC
  Administered 2022-01-25 – 2022-01-27 (×3): 5 mg via ORAL
  Filled 2022-01-24 (×3): qty 1

## 2022-01-24 MED ORDER — SODIUM CHLORIDE 0.9% FLUSH
3.0000 mL | Freq: Two times a day (BID) | INTRAVENOUS | Status: DC
  Administered 2022-01-24 – 2022-01-27 (×6): 3 mL via INTRAVENOUS

## 2022-01-24 MED ORDER — MAGNESIUM SULFATE 2 GM/50ML IV SOLN
2.0000 g | Freq: Once | INTRAVENOUS | Status: AC
  Administered 2022-01-24: 2 g via INTRAVENOUS
  Filled 2022-01-24: qty 50

## 2022-01-24 MED ORDER — SACUBITRIL-VALSARTAN 97-103 MG PO TABS
1.0000 | ORAL_TABLET | Freq: Two times a day (BID) | ORAL | Status: DC
  Administered 2022-01-24 – 2022-01-27 (×5): 1 via ORAL
  Filled 2022-01-24 (×8): qty 1

## 2022-01-24 MED ORDER — CARVEDILOL 25 MG PO TABS
25.0000 mg | ORAL_TABLET | Freq: Two times a day (BID) | ORAL | Status: DC
  Administered 2022-01-24 – 2022-01-25 (×2): 25 mg via ORAL
  Filled 2022-01-24 (×3): qty 1

## 2022-01-24 MED ORDER — POTASSIUM CHLORIDE CRYS ER 20 MEQ PO TBCR
60.0000 meq | EXTENDED_RELEASE_TABLET | ORAL | Status: AC
  Administered 2022-01-24: 60 meq via ORAL
  Filled 2022-01-24: qty 3

## 2022-01-24 NOTE — Progress Notes (Addendum)
Pharmacy: Dofetilide (Tikosyn) - Initial Consult Assessment and Electrolyte Replacement  Pharmacy consulted to assist in monitoring and replacing electrolytes in this 61 y.o. female admitted on 01/24/2022 undergoing dofetilide initiation. First dofetilide dose: 500 mg bid  Assessment:  Patient Exclusion Criteria: If any screening criteria checked as "Yes", then  patient  should NOT receive dofetilide until criteria item is corrected.  If "Yes" please indicate correction plan.  YES  NO Patient  Exclusion Criteria Correction Plan   []   [x]   Baseline QTc interval is greater than or equal to 440 msec. IF above YES box checked dofetilide contraindicated unless patient has ICD; then may proceed if QTc 500-550 msec or with known ventricular conduction abnormalities may proceed with QTc 550-600 msec. QTc =      []   [x]   Patient is known or suspected to have a digoxin level greater than 2 ng/ml: Lab Results  Component Value Date   DIGOXIN 0.7 (L) 01/10/2022       []   [x]   Creatinine clearance less than 20 ml/min (calculated using Cockcroft-Gault, actual body weight and serum creatinine): Estimated Creatinine Clearance: 72.4 mL/min (by C-G formula based on SCr of 0.93 mg/dL).     []   [x]  Patient has received drugs known to prolong the QT intervals within the last 48 hours (phenothiazines, tricyclics or tetracyclic antidepressants, erythromycin, H-1 antihistamines, cisapride, fluoroquinolones, azithromycin, ondansetron).   Updated information on QT prolonging agents is available to be searched on the following database:QT prolonging agents     []   [x]   Patient received a dose of hydrochlorothiazide (Oretic) alone or in any combination including triamterene (Dyazide, Maxzide) in the last 48 hours.    []   [x]  Patient received a medication known to increase dofetilide plasma concentrations prior to initial dofetilide dose:  Trimethoprim (Primsol, Proloprim) in the last 36  hours Verapamil (Calan, Verelan) in the last 36 hours or a sustained release dose in the last 72 hours Megestrol (Megace) in the last 5 days  Cimetidine (Tagamet) in the last 6 hours Ketoconazole (Nizoral) in the last 24 hours Itraconazole (Sporanox) in the last 48 hours  Prochlorperazine (Compazine) in the last 36 hours     []   [x]   Patient is known to have a history of torsades de pointes; congenital or acquired long QT syndromes.    []   [x]   Patient has received a Class 1 antiarrhythmic with less than 2 half-lives since last dose. (Disopyramide, Quinidine, Procainamide, Lidocaine, Mexiletine, Flecainide, Propafenone)    []   [x]   Patient has received amiodarone therapy in the past 3 months or amiodarone level is greater than 0.3 ng/ml.    Labs:    Component Value Date/Time   K 3.5 01/24/2022 1600   MG 1.9 01/23/2022 1118     Plan: Select One Calculated CrCl  Dose q12h  [x]  > 60 ml/min 500 mcg  []  40-60 ml/min 250 mcg  []  20-40 ml/min 125 mcg   [x]   Physician selected initial dose within range recommended for patients level of renal function - will monitor for response.  []   Physician selected initial dose outside of range recommended for patients level of renal function - will discuss if the dose should be altered at this time.   Patient has been appropriately anticoagulated with apixaban.  Potassium: K 3.5-3.7:  Hold Tikosyn initiation and give KCl 60 mEq po x1 and repeat BMET 2hr after dose - repeat appropriate dose if K < 4    ADDENDUM:  Repeat K was  4.9 - okay to give Tikosyn  Magnesium: Mg 1.8-2: Give Mg 2 gm IV x1 to prevent Mg from dropping below 1.8 - do not need to recheck Mg. Appropriate to initiate Tikosyn   Thank you for allowing pharmacy to participate in this patient's care   Jeanella Cara, PharmD, [facility] Clinical Pharmacist Please see AMION for all Pharmacists' Contact Phone Numbers 01/24/2022, 6:13 PM

## 2022-01-24 NOTE — Progress Notes (Signed)
Pharmacy: Dofetilide (Tikosyn) - Initial Consult Assessment and Electrolyte Replacement  Pharmacy consulted to assist in monitoring and replacing electrolytes in this 61 y.o. female admitted on 01/24/2022 undergoing dofetilide initiation. First dofetilide dose: 01/24/22  Assessment:  Patient Exclusion Criteria: If any screening criteria checked as "Yes", then  patient  should NOT receive dofetilide until criteria item is corrected.  If "Yes" please indicate correction plan.  YES  NO Patient  Exclusion Criteria Correction Plan   []   [x]   Baseline QTc interval is greater than or equal to 440 msec. IF above YES box checked dofetilide contraindicated unless patient has ICD; then may proceed if QTc 500-550 msec or with known ventricular conduction abnormalities may proceed with QTc 550-600 msec. QTc = 437    []   [x]   Patient is known or suspected to have a digoxin level greater than 2 ng/ml: Lab Results  Component Value Date   DIGOXIN 0.7 (L) 01/10/2022       []   [x]   Creatinine clearance less than 20 ml/min (calculated using Cockcroft-Gault, actual body weight and serum creatinine): Estimated Creatinine Clearance: 68.7 mL/min (by C-G formula based on SCr of 0.98 mg/dL).     []   [x]  Patient has received drugs known to prolong the QT intervals within the last 48 hours (phenothiazines, tricyclics or tetracyclic antidepressants, erythromycin, H-1 antihistamines, cisapride, fluoroquinolones, azithromycin, ondansetron).   Updated information on QT prolonging agents is available to be searched on the following database:QT prolonging agents     []   [x]   Patient received a dose of hydrochlorothiazide (Oretic) alone or in any combination including triamterene (Dyazide, Maxzide) in the last 48 hours.    []   [x]  Patient received a medication known to increase dofetilide plasma concentrations prior to initial dofetilide dose:  Trimethoprim (Primsol, Proloprim) in the last 36  hours Verapamil (Calan, Verelan) in the last 36 hours or a sustained release dose in the last 72 hours Megestrol (Megace) in the last 5 days  Cimetidine (Tagamet) in the last 6 hours Ketoconazole (Nizoral) in the last 24 hours Itraconazole (Sporanox) in the last 48 hours  Prochlorperazine (Compazine) in the last 36 hours     []   [x]   Patient is known to have a history of torsades de pointes; congenital or acquired long QT syndromes.    []   [x]   Patient has received a Class 1 antiarrhythmic with less than 2 half-lives since last dose. (Disopyramide, Quinidine, Procainamide, Lidocaine, Mexiletine, Flecainide, Propafenone)    []   [x]   Patient has received amiodarone therapy in the past 3 months or amiodarone level is greater than 0.3 ng/ml.    Labs:    Component Value Date/Time   K 3.6 01/23/2022 1118   MG 1.9 01/23/2022 1118     Plan: Select One Calculated CrCl  Dose q12h  [x]  > 60 ml/min 500 mcg  []  40-60 ml/min 250 mcg  []  20-40 ml/min 125 mcg   [x]   Physician selected initial dose within range recommended for patients level of renal function - will monitor for response.  []   Physician selected initial dose outside of range recommended for patients level of renal function - will discuss if the dose should be altered at this time.   Patient has been appropriately anticoagulated with apixaban 5mg  BID.  Potassium: K 3.5-3.7:  Hold Tikosyn initiation and give KCl 60 mEq po x1 and repeat BMET 2hr after dose - repeat appropriate dose if K < 4    Magnesium: Mg 1.8-2: Give Mg 2  gm IV x1 to prevent Mg from dropping below 1.8 - do not need to recheck Mg. Appropriate to initiate Tikosyn   Thank you for allowing pharmacy to participate in this patient's care   Fredonia Highland, PharmD, BCPS, [facility] Clinical Pharmacist [phone] Please check AMION for all [facility] Pharmacy numbers 01/24/2022

## 2022-01-24 NOTE — H&P (Signed)
Primary Care Physician: Marcine Matar, MD Primary Cardiologist: Dr Elease Hashimoto Primary Electrophysiologist: Dr Elberta Fortis [facility]: Dr Gasper Lloyd Referring Physician: Manson Passey PA   Joanna [name] is a 61 y.o. female with a history of HTN, combined chronic systolic and diastolic CHF, tobacco abuse, MR, and persistent atrial fibrillation who presents for follow up in the [facility]ibr[facility].  The patient was initially diagnosed with atrial fibrillation on 03/08/18 at the ER after presenting with symptoms of dyspnea on exertion for one week prior. ECG at ER showed afib with RVR and she was given Lopressor and started on Cardizem drip and heparin. She underwent successful DCCV. Echo on admission showed severely reduced EF 20-25%, possibly tachycardia mediated. Discharged on Eliquis and Lopressor. On follow up, she was noted to be in afib again with RVR. Started on amiodarone which did well keeping her in SR. Unfortunately, she developed amiodarone induced hyperthyroidism and this was discontinued 10/16/21.  Patient was hospitalized 12/20/21 with acute CHF in the setting of rapid Afib. Echo showed EF back down again, 25-30% w/ global HK, LV mod-severely dilated, LA severely dilated w/ eccentric MR, posteriorly directed into LA, severe regurgitation. She was diuresed w/ IV Lasix and transitioned to PO with additions of Farxiga and spironolactone. Treated w/ Coreg and digoxin for rate control.   On follow up today, patient presents for dofetilide admission. She denies any missed doses of anticoagulation in the past 3 weeks. She remains in rate controlled afib.   Today, she denies symptoms of palpitations, chest pain, orthopnea, PND, lower extremity edema, dizziness, presyncope, syncope, snoring, daytime somnolence, bleeding, or neurologic sequela. The patient is tolerating medications without difficulties and is otherwise without complaint today.    Atrial Fibrillation Risk  Factors:  she does not have symptoms or diagnosis of sleep apnea. she does not have a history of rheumatic fever. she does not have a history of alcohol use. The patient does not have a history of early familial atrial fibrillation or other arrhythmias.  she has a BMI of Body mass index is 24.98 kg/m.Marland Kitchen Filed Weights   01/24/22 1008  Weight: 83.6 kg    Family History  Problem Relation Age of Onset   Cancer Mother    Heart disease Father      Atrial Fibrillation Management history:  Previous antiarrhythmic drugs: amiodarone  Previous cardioversions: 03/14/2018 Previous ablations: none CHADS2VASC score: 3  Anticoagulation history: Eliquis   Past Medical History:  Diagnosis Date   Atrial fibrillation with RVR (HCC)    Chronic combined systolic (congestive) and diastolic (congestive) heart failure (HCC)    Dyspnea    Goiter    Hypertension    NICM (nonischemic cardiomyopathy) (HCC)    a. 08/2005 Echo: EF 30-35%, mod diff HK. Mild to Mod MR. Mildly dil LA; b. 08/2005 Cath: Nl Cors. Elevated CO w/o shunt; c. 09/2007 Echo: EF 45%. Mild to mod MR; c. 03/2015 Echo: EF 45%, global HK. Gr1 DD. Mild MR. Mildly dil LA.   Past Surgical History:  Procedure Laterality Date   APPENDECTOMY     BACK SURGERY     CARDIOVERSION N/A 03/14/2018   Procedure: CARDIOVERSION;  Surgeon: Pricilla Riffle, MD;  Location[facility] ENDOSCOPY;  Service: Cardiovascular;  Laterality: N/A;   TEE WITHOUT CARDIOVERSION N/A 03/14/2018   Procedure: TRANSESOPHAGEAL ECHOCARDIOGRAM (TEE);  Surgeon: Pricilla Riffle, MD;  Location: [facility] ENDOSCOPY;  Service: Cardiovascular;  Laterality: N/A;    Current Facility-Administered Medications  Medication Dose Route Frequency Provider Last  Rate Last Admin   0.9 %  sodium chloride infusion  250 mL Intravenous PRN Fenton, Clint R, PA       apixaban (ELIQUIS) tablet 5 mg  5 mg Oral BID Fenton, Clint R, PA       carvedilol (COREG) tablet 25 mg  25 mg Oral BID WC Fenton, Clint R, PA        dapagliflozin propanediol (FARXIGA) tablet 10 mg  10 mg Oral Daily Fenton, Clint R, PA       digoxin (LANOXIN) tablet 0.125 mg  0.125 mg Oral Daily Fenton, Clint R, PA       dofetilide (TIKOSYN) capsule 500 mcg  500 mcg Oral BID Ursuy, [name] Lynn, PA-C       furosemide (LASIX) tablet 40 mg  40 mg Oral Daily Fenton, Clint R, PA       methimazole (TAPAZOLE) tablet 5 mg  5 mg Oral Daily Fenton, Clint R, PA       sacubitril-valsartan (ENTRESTO) 97-103 mg per tablet  1 tablet Oral BID Fenton, Clint R, PA       sodium chloride flush (NS) 0.9 % injection 3 mL  3 mL Intravenous Q12H Fenton, Clint R, PA       sodium chloride flush (NS) 0.9 % injection 3 mL  3 mL Intravenous PRN Fenton, Clint R, PA       spironolactone (ALDACTONE) tablet 25 mg  25 mg Oral Daily Fenton, Clint R, PA        No Known Allergies   Social History   Socioeconomic History   Marital status: Single    Spouse name: Not on file   Number of children: 1   Years of education: Not on file   Highest education level: High school graduate  Occupational History   Occupation: unemployed    Comment: on disablity  Tobacco Use   Smoking status: Former    Packs/day: 2.00    Years: 30.00    Total pack years: 60.00    Types: Cigarettes    Quit date: 02/08/2018    Years since quitting: 3.9   Smokeless tobacco: Never   Tobacco comments:    Former smoker 01/16/22  Vaping Use   Vaping Use: Never used  Substance and Sexual Activity   Alcohol use: No    Comment: quit 2 years ago   Drug use: No   Sexual activity: Not on file  Other Topics Concern   Not on file  Social History Narrative   Not on file   Social Determinants of Health   Financial Resource Strain: Low Risk  (12/22/2021)   Overall Financial Resource Strain (CARDIA)    Difficulty of Paying Living Expenses: Not very hard  Food Insecurity: No Food Insecurity (12/26/2021)   Hunger Vital Sign    Worried About Running Out of Food in the Last Year: Never true    Ran Out  of Food in the Last Year: Never true  Transportation Needs: No Transportation Needs (12/26/2021)   PRAPARE - Administrator, Civil Service (Medical): No    Lack of Transportation (Non-Medical): No  Physical Activity: Not on file  Stress: Not on file  Social Connections: Not on file  Intimate Partner Violence: Not At Risk (12/21/2021)   Humiliation, Afraid, Rape, and Kick questionnaire    Fear of Current or Ex-Partner: No    Emotionally Abused: No    Physically Abused: No    Sexually Abused: No     ROS-  All systems are reviewed and negative except as per the HPI above.  Physical Exam: Vitals:   01/24/22 1008  BP: (!) 86/70  Pulse: 89  Resp: 20  Temp: (!) 97.3 F (36.3 C)  TempSrc: Oral  SpO2: 100%  Weight: 83.6 kg  Height: 6' (1.829 m)     GEN- The patient is a well appearing female, alert and oriented x 3 today.   HEENT-head normocephalic, atraumatic, sclera clear, conjunctiva pink, hearing intact, trachea midline. Lungs- Clear to ausculation bilaterally, normal work of breathing Heart- irregular rate and rhythm, no rubs or gallops, 2/6 systolic murmur  GI- soft, NT, ND, + BS Extremities- no clubbing, cyanosis, or edema MS- no significant deformity or atrophy Skin- no rash or lesion Psych- euthymic mood, full affect Neuro- strength and sensation are intact   Wt Readings from Last 3 Encounters:  01/24/22 83.6 kg  01/23/22 83.5 kg  01/16/22 84.6 kg    EKG today demonstrates Afib, NST Vent. rate 90 BPM PR interval * ms QRS duration 92 ms QT/QTcB 334/408 ms  Echo 12/21/21 demonstrated   1. No significant change from echo done in 2020.   2. Left ventricular ejection fraction, by estimation, is 25 to 30%. The  left ventricle has severely decreased function. The left ventricle  demonstrates global hypokinesis. The left ventricular internal cavity size  was moderately to severely dilated.  There is mild left ventricular hypertrophy. Left ventricular  diastolic  parameters are indeterminate.   3. Right ventricular systolic function is low normal. The right  ventricular size is normal. There is moderately elevated pulmonary artery  systolic pressure.   4. Left atrial size was severely dilated.   5. MR is eccentric, posteriorly directed into LA . The mitral valve is  abnormal. Severe mitral valve regurgitation.   6. Tricuspid valve regurgitation is mild to moderate.   7. The aortic valve is tricuspid. Aortic valve regurgitation is trivial.  Aortic valve sclerosis is present, with no evidence of aortic valve  stenosis.   8. The inferior vena cava is dilated in size with <50% respiratory  variability, suggesting right atrial pressure of 15 mmHg.   Epic records are reviewed at length today   CHA2DS2-VASc Score = 3  The patient's score is based upon: CHF History: 1 HTN History: 1 Diabetes History: 0 Stroke History: 0 Vascular Disease History: 0 Age Score: 0 Gender Score: 1       ASSESSMENT AND PLAN: 1. Persistent Atrial Fibrillation (ICD10:  I48.19) The patient's CHA2DS2-VASc score is 3, indicating a 3.2% annual risk of stroke.   Patient off amiodarone due to hyperthyroidism, on methimazole.  Patient presents for dofetilide admission.  Continue Eliquis 5 mg BID, states no missed doses in the last 3 weeks. No recent benadryl use PharmD has screened medications QTc in SR 437 ms on ECG 10/12/21 Labs today show creatinine at 0.98, K+ 3.6 and mag 1.9, CrCl calculated at 78 mL/min Continue carvedilol 25 mg BID Continue digoxin 0.125 mg daily Patient does state that this is the last time she would like to try AAD+ DCCV to get back in SR.   2. Secondary Hypercoagulable State (ICD10:  D68.69) The patient is at significant risk for stroke/thromboembolism based upon her CHA2DS2-VASc Score of 3.  Continue Apixaban (Eliquis).   3. Chronic systolic CHF Suspected tachycardia mediated.  GDMT per [facility], followed by Dr Gasper Lloyd. Fluid  status appears stable today.  4. Valvular heart disease Severe MR TEE scheduled to evaluate per Dr Gasper Lloyd.  To be admitted later today once a bed becomes available.    Jorja Loa PA-C Afib Clinic [facility] [address] [place], [place] [address] [phone] 01/24/2022 11:46 AM  I have seen and examined this patient with Jorja Loa.  Agree with above, note added to reflect my findings.  Admitted to the hospital for dofetilide load.  QTc is remained stable.  Is in atrial fibrillation today.  She currently feels well with mild shortness of breath and fatigue.  GEN: Well nourished, well developed, in no acute distress  HEENT: normal  Neck: no JVD, carotid bruits, or masses Cardiac: Irregular; no murmurs, rubs, or gallops,no edema  Respiratory:  clear to auscultation bilaterally, normal work of breathing GI: soft, nontender, nondistended, + BS MS: no deformity or atrophy  Skin: warm and dry Neuro:  Strength and sensation are intact Psych: euthymic mood, full affect   Persistent atrial fibrillation: Plan to start dofetilide tonight.  Potassium is low.  Crosby Bevan replete throughout the day today.  Rehmat Murtagh start with 500 mcg twice daily.  ECG after each dose.  She does not convert to sinus rhythm, plan for cardioversion after the fourth dose. Chronic systolic heart failure: Potentially tachycardia mediated.  Plan for dofetilide load as above.  No evidence of volume overload.   Severe mitral regurgitation: Has a TEE scheduled.  Likely functional due to heart failure.  Plan per heart failure cardiology.  Breannah Kratt M. Gerlad Pelzel MD 01/24/2022 11:46 AM

## 2022-01-24 NOTE — TOC Benefit Eligibility Note (Signed)
Patient Product/process development scientist completed.    The patient is currently admitted and upon discharge could be taking dofetilide (Tikosyn) 500 mcg capsules.  The current 30 day co-pay is $0.00.   The patient is insured through [employer] part D   Roland Earl, CPHT Pharmacy Patient Advocate Specialist [facility] Patient Advocate Team Direct Number: [phone]  Fax: [phone]

## 2022-01-24 NOTE — Progress Notes (Signed)
Patient confirmed taking all of her home medications yesterday (and everyday) when asked about her Eliquis, says that she never misses. Took her AM meds this morning at home as well.  Francis Dowse, PA-C

## 2022-01-25 ENCOUNTER — Encounter (HOSPITAL_COMMUNITY): Payer: Self-pay | Admitting: Anesthesiology

## 2022-01-25 DIAGNOSIS — I4819 Other persistent atrial fibrillation: Secondary | ICD-10-CM | POA: Diagnosis not present

## 2022-01-25 LAB — BASIC METABOLIC PANEL
Anion gap: 10 (ref 5–15)
BUN: 14 mg/dL (ref 8–23)
CO2: 28 mmol/L (ref 22–32)
Calcium: 9.3 mg/dL (ref 8.9–10.3)
Chloride: 104 mmol/L (ref 98–111)
Creatinine, Ser: 0.87 mg/dL (ref 0.44–1.00)
GFR, Estimated: >60 mL/min (ref >60)
Glucose, Bld: 132 mg/dL — ABNORMAL HIGH (ref 70–99)
Potassium: 4.5 mmol/L (ref 3.5–5.1)
Sodium: 142 mmol/L (ref 135–145)

## 2022-01-25 LAB — MAGNESIUM: Magnesium: 2.2 mg/dL (ref 1.7–2.4)

## 2022-01-25 MED ORDER — SODIUM CHLORIDE 0.9 % IV SOLN: INTRAVENOUS | Status: DC

## 2022-01-25 MED ORDER — CARVEDILOL 12.5 MG PO TABS: 12.5000 mg | ORAL_TABLET | Freq: Two times a day (BID) | ORAL | Status: DC

## 2022-01-25 NOTE — Progress Notes (Signed)
Pharmacy: Dofetilide (Tikosyn) - Follow Up Assessment and Electrolyte Replacement  Pharmacy consulted to assist in monitoring and replacing electrolytes in this 61 y.o. female admitted on 01/24/2022 undergoing dofetilide initiation. First dofetilide dose: 01/24/22  Labs:    Component Value Date/Time   K 4.5 01/25/2022 0218   MG 2.2 01/25/2022 0218     Plan: Potassium: K >/= 4: No additional supplementation needed  Magnesium: Mg > 2: No additional supplementation needed    Thank you for allowing pharmacy to participate in this patient's care    Fredonia Highland, PharmD, BCPS, [facility] Clinical Pharmacist [phone] Please check AMION for all [facility] Pharmacy numbers 01/25/2022

## 2022-01-25 NOTE — Care Management (Addendum)
  Transition of Care [facility]) Screening Note   Patient Details  Name: Joanna [name] Date of Birth: 05-14-1960   Transition of Care [facility]) CM/SW Contact:    Gala Lewandowsky, RN Phone Number: 01/25/2022, 12:36 PM    Case Manager spoke with patient regarding Tikosyn cost. Patient states she receives meds from [facility] and back up pharmacy is [facility] (medication is in stock). Patient would like for the initial Rx to be escribed to [facility] and Rx refills 90 day supply to be escribed to [facility]. No further home needs identified at this time.

## 2022-01-25 NOTE — Progress Notes (Addendum)
Rounding Note    Patient Name: Joanna [name] Date of Encounter: 01/25/2022  [facility] Cardiologist: Kristeen Miss, MD  HF: Dr. Gasper Lloyd  Subjective   Slept well, no complaints, tolerating drug  Inpatient Medications    Scheduled Meds:  apixaban  5 mg Oral BID   carvedilol  25 mg Oral BID WC   dapagliflozin propanediol  10 mg Oral Daily   digoxin  0.125 mg Oral Daily   dofetilide  500 mcg Oral BID   furosemide  40 mg Oral Daily   methimazole  5 mg Oral Daily   sacubitril-valsartan  1 tablet Oral BID   sodium chloride flush  3 mL Intravenous Q12H   spironolactone  12.5 mg Oral Daily   Continuous Infusions:  sodium chloride     PRN Meds: sodium chloride, sodium chloride flush   Vital Signs    Vitals:   01/24/22 1719 01/24/22 2017 01/25/22 0024 01/25/22 0459  BP: 111/62 113/67 110/75 (!) 92/54  Pulse: (!) 55 70 70 63  Resp: 18     Temp: 97.6 F (36.4 C) 98 F (36.7 C) 98.2 F (36.8 C) 98.1 F (36.7 C)  TempSrc: Oral Oral Oral Oral  SpO2: 100% 99% 96% 97%  Weight:      Height:        Intake/Output Summary (Last 24 hours) at 01/25/2022 0738 Last data filed at 01/24/2022 1700 Gross per 24 hour  Intake 290 ml  Output --  Net 290 ml      01/24/2022   10:08 AM 01/23/2022   10:49 AM 01/16/2022    2:24 PM  Last 3 Weights  Weight (lbs) 184 lb 3.2 oz 184 lb 186 lb 9.6 oz  Weight (kg) 83.553 kg 83.462 kg 84.641 kg      Telemetry    AFib, generally 60's-80s' , nocturnal rates to 40 range - Personally Reviewed  ECG    AFib 63bpm, QTc is stable - Personally Reviewed with Dr. Elberta Fortis  Physical Exam   GEN: No acute distress.   Neck: No JVD Cardiac: irreg-irreg, no murmurs, rubs, or gallops.  Respiratory: CTA b/l. GI: Soft, nontender, non-distended  MS: No edema; No deformity. Neuro:  Nonfocal  Psych: Normal affect   Labs    High Sensitivity Troponin:  No results for input(s): "TROPONINIHS" in the last 720 hours.    Chemistry Recent Labs  Lab 01/23/22 1118 01/24/22 1600 01/24/22 2108 01/25/22 0218  NA 142 142 141 142  K 3.6 3.5 4.9 4.5  CL 108 108 104 104  CO2 24 25 24 28   GLUCOSE 98 87 92 132*  BUN 12 12 16 14   CREATININE 0.98 0.93 1.15* 0.87  CALCIUM 9.3 9.1 9.2 9.3  MG 1.9  --   --  2.2  GFRNONAA >60 >60 54* >60  ANIONGAP 10 9 13 10     Lipids No results for input(s): "CHOL", "TRIG", "HDL", "LABVLDL", "LDLCALC", "CHOLHDL" in the last 168 hours.  HematologyNo results for input(s): "WBC", "RBC", "HGB", "HCT", "MCV", "MCH", "MCHC", "RDW", "PLT" in the last 168 hours. Thyroid No results for input(s): "TSH", "FREET4" in the last 168 hours.  BNPNo results for input(s): "BNP", "PROBNP" in the last 168 hours.  DDimer No results for input(s): "DDIMER" in the last 168 hours.   Radiology    No results found.  Cardiac Studies    12/21/21: TTE 1. No significant change from echo done in 2020.   2. Left ventricular ejection fraction, by estimation, is 25  to 30%. The  left ventricle has severely decreased function. The left ventricle  demonstrates global hypokinesis. The left ventricular internal cavity size  was moderately to severely dilated.  There is mild left ventricular hypertrophy. Left ventricular diastolic  parameters are indeterminate.   3. Right ventricular systolic function is low normal. The right  ventricular size is normal. There is moderately elevated pulmonary artery  systolic pressure.   4. Left atrial size was severely dilated.   5. MR is eccentric, posteriorly directed into LA . The mitral valve is  abnormal. Severe mitral valve regurgitation.   6. Tricuspid valve regurgitation is mild to moderate.   7. The aortic valve is tricuspid. Aortic valve regurgitation is trivial.  Aortic valve sclerosis is present, with no evidence of aortic valve  stenosis.   8. The inferior vena cava is dilated in size with <50% respiratory  variability, suggesting right atrial pressure of 15  mmHg.   Patient Profile     61 y.o. female w/PMHx of HTN, NICM, chronic CHF (systolic), VHD (severe MR, functional) AFib admitted for Tikosyn  Assessment & Plan    Persistent AFib CHA2DS2Vasc is 3, on Eliquis, appropriately dosed Tikosyn load is in progress K+ 4.5 Mag 2.2 Creat 0.87  QTc stable DCCV tomorrow if not in SR, pt is aware and agreeable  NICM Chronic CHF Appears compensated currently Plans with HF team to revisit echo if able to restore/maintain SR Continue home meds  VHD Severe MR (functional)  HTN Home meds    For questions or updates, please contact [facility] Please consult [email or web address] for contact info under        Signed, Sheilah Pigeon, PA-C  01/25/2022, 7:38 AM    I have seen and examined this patient with Francis Dowse.  Agree with above, note added to reflect my findings.  Feeling well.  Remains in atrial fibrillation.  No complaints.  GEN: Well nourished, well developed, in no acute distress  HEENT: normal  Neck: no JVD, carotid bruits, or masses Cardiac: Irregular; no murmurs, rubs, or gallops,no edema  Respiratory:  clear to auscultation bilaterally, normal work of breathing GI: soft, nontender, nondistended, + BS MS: no deformity or atrophy  Skin: warm and dry Neuro:  Strength and sensation are intact Psych: euthymic mood, full affect   Persistent atrial fibrillation: CHA2DS2-VASc of 3.  On Eliquis 5 mg twice daily.  Remains in atrial fibrillation.  Dofetilide load in progress.  Plan for cardioversion tomorrow. Chronic systolic heart failure: Due to nonischemic cardiomyopathy.  No evidence of volume overload.  No changes. Severe mitral regurgitation: No evidence of shortness of breath or volume overload.  Plan per heart failure cardiology.  Lorrinda Ramstad M. Shawnya Mayor MD 01/25/2022 10:17 AM

## 2022-01-25 NOTE — Progress Notes (Signed)
Heart Failure Navigator Progress Note  Assessed for Heart & Vascular TOC clinic readiness.  Patient does not meet criteria due to Advanced Heart Failure patient.   Navigator will sign off at this time.   Moncia Annas, BSN, RN Heart Failure Nurse Navigator Secure Chat Only   

## 2022-01-26 ENCOUNTER — Encounter (HOSPITAL_COMMUNITY)
Admission: RE | Disposition: A | Discharge status: Home / Self Care | Payer: Self-pay | Source: Home / Self Care | Attending: Cardiology

## 2022-01-26 DIAGNOSIS — I4819 Other persistent atrial fibrillation: Secondary | ICD-10-CM | POA: Diagnosis not present

## 2022-01-26 LAB — DIGOXIN LEVEL: Digoxin Level: 0.7 ng/mL — ABNORMAL LOW (ref 0.8–2.0)

## 2022-01-26 LAB — MAGNESIUM: Magnesium: 2 mg/dL (ref 1.7–2.4)

## 2022-01-26 LAB — BASIC METABOLIC PANEL
Anion gap: 9 (ref 5–15)
BUN: 16 mg/dL (ref 8–23)
CO2: 26 mmol/L (ref 22–32)
Calcium: 9.2 mg/dL (ref 8.9–10.3)
Chloride: 105 mmol/L (ref 98–111)
Creatinine, Ser: 1.02 mg/dL — ABNORMAL HIGH (ref 0.44–1.00)
GFR, Estimated: >60 mL/min (ref >60)
Glucose, Bld: 104 mg/dL — ABNORMAL HIGH (ref 70–99)
Potassium: 4.1 mmol/L (ref 3.5–5.1)
Sodium: 140 mmol/L (ref 135–145)

## 2022-01-26 SURGERY — CARDIOVERSION
Anesthesia: General

## 2022-01-26 MED ORDER — MAGNESIUM SULFATE 2 GM/50ML IV SOLN
2.0000 g | Freq: Once | INTRAVENOUS | Status: AC
  Administered 2022-01-26: 2 g via INTRAVENOUS
  Filled 2022-01-26: qty 50

## 2022-01-26 NOTE — Progress Notes (Signed)
Pharmacy: Dofetilide (Tikosyn) - Follow Up Assessment and Electrolyte Replacement  Pharmacy consulted to assist in monitoring and replacing electrolytes in this 61 y.o. female admitted on 01/24/2022 undergoing dofetilide initiation. First dofetilide dose: 01/24/22  Labs:    Component Value Date/Time   K 4.1 01/26/2022 0207   MG 2.0 01/26/2022 0207     Plan: Potassium: K >/= 4: No additional supplementation needed  Magnesium: Mg 1.8-2: Give Mg 2 gm IV x1     Thank you for allowing pharmacy to participate in this patient's care    Fredonia Highland, PharmD, BCPS, [facility] Clinical Pharmacist [phone] Please check AMION for all [facility] Pharmacy numbers 01/26/2022

## 2022-01-26 NOTE — Care Management Important Message (Signed)
Important Message  Patient Details  Name: Joanna [name] MRN: [id number] Date of Birth: 12-11-1960   Medicare Important Message Given:  Yes     Renie Ora 01/26/2022, 9:07 AM

## 2022-01-26 NOTE — Progress Notes (Signed)
Mobility Specialist - Progress Note   01/26/22 1551  Mobility  Activity Ambulated with assistance in hallway  Level of Assistance Standby assist, set-up cues, supervision of patient - no hands on  Assistive Device None  Distance Ambulated (ft) 180 ft  Activity Response Tolerated well  Mobility Referral Yes  $Mobility charge 1 Mobility   Pt received in room and agreeable to session. No complaints throughout. Pt was returned to room with all needs met.   Franki Monte  Mobility Specialist Please contact via Solicitor or Rehab office at [phone]

## 2022-01-26 NOTE — Progress Notes (Addendum)
Rounding Note    Patient Name: Joanna [name] Date of Encounter: 01/26/2022  [facility] Cardiologist: Kristeen Miss, MD  HF: Dr. Gasper Lloyd  Subjective   Feels better, breathing easier, laying supine and flat comfortably.  She can tell she is in normal rhythm  Inpatient Medications    Scheduled Meds:  apixaban  5 mg Oral BID   dapagliflozin propanediol  10 mg Oral Daily   dofetilide  500 mcg Oral BID   furosemide  40 mg Oral Daily   methimazole  5 mg Oral Daily   sacubitril-valsartan  1 tablet Oral BID   sodium chloride flush  3 mL Intravenous Q12H   spironolactone  12.5 mg Oral Daily   Continuous Infusions:  sodium chloride     sodium chloride     magnesium sulfate bolus IVPB     PRN Meds: sodium chloride, sodium chloride flush   Vital Signs    Vitals:   01/25/22 1236 01/25/22 1707 01/25/22 2125 01/26/22 0447  BP: 106/66 113/76 117/71 92/60  Pulse: (!) 41 (!) 48 60 (!) 51  Resp: 18 18 18 18   Temp: (!) 97.5 F (36.4 C) 98.8 F (37.1 C) 98.6 F (37 C) 98.2 F (36.8 C)  TempSrc: Oral Oral Oral Oral  SpO2:   97% 96%  Weight:      Height:       No intake or output data in the 24 hours ending 01/26/22 0751     01/24/2022   10:08 AM 01/23/2022   10:49 AM 01/16/2022    2:24 PM  Last 3 Weights  Weight (lbs) 184 lb 3.2 oz 184 lb 186 lb 9.6 oz  Weight (kg) 83.553 kg 83.462 kg 84.641 kg      Telemetry    SB 40's -50's, some 50's had a 4.2 second post conversion pause- Personally Reviewed  ECG    SB 47bpm, QTc 14/01/2022 - Personally Reviewed with Dr.  Physical Exam   GEN: No acute distress.   Neck: No JVD Cardiac: RRR, bradycardic no murmurs, rubs, or gallops.  Respiratory: CTA b/l. GI: Soft, nontender, non-distended  MS: No edema; No deformity. Neuro:  Nonfocal  Psych: Normal affect   Labs    High Sensitivity Troponin:  No results for input(s): "TROPONINIHS" in the last 720 hours.   Chemistry Recent Labs  Lab  01/23/22 1118 01/24/22 1600 01/24/22 2108 01/25/22 0218 01/26/22 0207  NA 142   < > 141 142 140  K 3.6   < > 4.9 4.5 4.1  CL 108   < > 104 104 105  CO2 24   < > 24 28 26   GLUCOSE 98   < > 92 132* 104*  BUN 12   < > 16 14 16   CREATININE 0.98   < > 1.15* 0.87 1.02*  CALCIUM 9.3   < > 9.2 9.3 9.2  MG 1.9  --   --  2.2 2.0  GFRNONAA >60   < > 54* >60 >60  ANIONGAP 10   < > 13 10 9    < > = values in this interval not displayed.    Lipids No results for input(s): "CHOL", "TRIG", "HDL", "LABVLDL", "LDLCALC", "CHOLHDL" in the last 168 hours.  HematologyNo results for input(s): "WBC", "RBC", "HGB", "HCT", "MCV", "MCH", "MCHC", "RDW", "PLT" in the last 168 hours. Thyroid No results for input(s): "TSH", "FREET4" in the last 168 hours.  BNPNo results for input(s): "BNP", "PROBNP" in the last 168 hours.  DDimer No results for input(s): "DDIMER" in the last 168 hours.   Radiology    No results found.  Cardiac Studies    12/21/21: TTE 1. No significant change from echo done in 2020.   2. Left ventricular ejection fraction, by estimation, is 25 to 30%. The  left ventricle has severely decreased function. The left ventricle  demonstrates global hypokinesis. The left ventricular internal cavity size  was moderately to severely dilated.  There is mild left ventricular hypertrophy. Left ventricular diastolic  parameters are indeterminate.   3. Right ventricular systolic function is low normal. The right  ventricular size is normal. There is moderately elevated pulmonary artery  systolic pressure.   4. Left atrial size was severely dilated.   5. MR is eccentric, posteriorly directed into LA . The mitral valve is  abnormal. Severe mitral valve regurgitation.   6. Tricuspid valve regurgitation is mild to moderate.   7. The aortic valve is tricuspid. Aortic valve regurgitation is trivial.  Aortic valve sclerosis is present, with no evidence of aortic valve  stenosis.   8. The inferior vena  cava is dilated in size with <50% respiratory  variability, suggesting right atrial pressure of 15 mmHg.   Patient Profile     61 y.o. female w/PMHx of HTN, NICM, chronic CHF (systolic), VHD (severe MR, functional) AFib admitted for Tikosyn  Assessment & Plan    Persistent AFib CHA2DS2Vasc is 3, on Eliquis, appropriately dosed Tikosyn load is in progress K+ 4.1 Mag 2.0 Creat 1.02, stable  QTc stable Converted with drug Dr. Elberta Fortis has seen her, reviewed telemetry and EKG 4.2 second post conversion pause, SB Cleona Doubleday stop her dig and coreg, plan for early HF team f/u as well as usual post tikosyn load   Anticipate discharge tomorrow. Patient's preferred local pharmacy [facility] has Dofetilide in stock for a full 30 day fill , they also have though not a full 30 day supply.    NICM Chronic CHF Appears compensated currently Plans with HF team to revisit echo if able to restore/maintain SR Holding dig and coreg  VHD Severe MR (functional)  HTN Home meds    For questions or updates, please contact [facility] Please consult [email or web address] for contact info under        Signed, Sheilah Pigeon, PA-C  01/26/2022, 7:51 AM    I have seen and examined this patient with Francis Dowse.  Agree with above, note added to reflect my findings.  Converted to sinus rhythm, feeling improved  GEN: Well nourished, well developed, in no acute distress  HEENT: normal  Neck: no JVD, carotid bruits, or masses Cardiac: RRR; no murmurs, rubs, or gallops,no edema  Respiratory:  clear to auscultation bilaterally, normal work of breathing GI: soft, nontender, nondistended, + BS MS: no deformity or atrophy  Skin: warm and dry Neuro:  Strength and sensation are intact Psych: euthymic mood, full affect   Persistent atrial fibrillation: converted to sinus rhythm. QTc stable. Varnell Orvis continue current dose of tikosyn. Bradycardic post conversion. Aryan Sparks hold digoxin and  coreg.   Jakaylah Schlafer M. Novalyn Lajara MD 01/26/2022 1:29 PM

## 2022-01-27 LAB — BASIC METABOLIC PANEL
Anion gap: 7 (ref 5–15)
BUN: 15 mg/dL (ref 8–23)
CO2: 28 mmol/L (ref 22–32)
Calcium: 9.1 mg/dL (ref 8.9–10.3)
Chloride: 107 mmol/L (ref 98–111)
Creatinine, Ser: 0.97 mg/dL (ref 0.44–1.00)
GFR, Estimated: >60 mL/min (ref >60)
Glucose, Bld: 106 mg/dL — ABNORMAL HIGH (ref 70–99)
Potassium: 4.4 mmol/L (ref 3.5–5.1)
Sodium: 142 mmol/L (ref 135–145)

## 2022-01-27 LAB — MAGNESIUM: Magnesium: 2.1 mg/dL (ref 1.7–2.4)

## 2022-01-27 MED ORDER — DOFETILIDE 500 MCG PO CAPS: 500.0000 ug | ORAL_CAPSULE | Freq: Two times a day (BID) | ORAL | 5 refills | Status: DC

## 2022-01-27 MED ORDER — ACETAMINOPHEN 325 MG PO TABS
650.0000 mg | ORAL_TABLET | Freq: Four times a day (QID) | ORAL | Status: DC | PRN
  Administered 2022-01-27: 650 mg via ORAL
  Filled 2022-01-27: qty 2

## 2022-01-27 MED ORDER — CARVEDILOL 3.125 MG PO TABS: 3.1250 mg | ORAL_TABLET | Freq: Two times a day (BID) | ORAL | 2 refills | Status: DC

## 2022-01-27 NOTE — Progress Notes (Signed)
Pharmacy: Dofetilide (Tikosyn) - Follow Up Assessment and Electrolyte Replacement  Pharmacy consulted to assist in monitoring and replacing electrolytes in this 61 y.o. female admitted on 01/24/2022 undergoing dofetilide initiation. First dofetilide dose: 01/24/22  Labs:    Component Value Date/Time   K 4.4 01/27/2022 0143   MG 2.1 01/27/2022 0143     Plan: Potassium: K >/= 4: No additional supplementation needed  Magnesium: Mg > 2: No additional supplementation needed   Thank you for allowing pharmacy to participate in this patient's care   Rockwell Alexandria, PharmD, [facility] PGY1 Pharmacy Resident 01/27/2022 8:18 AM

## 2022-01-27 NOTE — Discharge Summary (Addendum)
Discharge Summary    Patient ID: Joanna [name] MRN: [id number]; DOB: 1960-06-14  Admit date: 01/24/2022 Discharge date: 01/27/2022  PCP:  Marcine Matar, MD   [facility] Providers Cardiologist:  Kristeen Miss, MD   {  Discharge Diagnoses    Principal Problem:   Persistent atrial fibrillation [facility]) Active Problems:   Essential hypertension   Chronic combined systolic and diastolic CHF (congestive heart failure) (HCC)   Severe mitral regurgitation    Diagnostic Studies/Procedures    None _____________   History of Present Illness     Joanna [name] is a 61 y.o. female with persistent atrial fibrillation on Eliquis, chronic combined CHF with EF **, severe mitral regurgitation, and hypertension who is followed by Dr. Elease Hashimoto and Dr. Elberta Fortis and was admitted in 01/24/2022 for Tikosyn load.  Patient was initially diagnosed with atrial fibrillation in 03/2018 after presenting to the ED with dyspnea on exertion.  He was found to be in rapid atrial fibrillation at that time.  Echo showed LVEF of 20-25% which was presumed to be tachycardia mediated at that time.  She was started on beta-blockers and Eliquis and underwent successful DCCV with restoration of sinus rhythm.  However, at follow-up she was noted to be back in rapid atrial fibrillation.  She was started on Amiodarone which did help keep her in sinus rhythm but she unfortunately developed Amiodarone induced hyperthyroidism and this was discontinued in 10/2021.  She was recently admitted from 12/20/2021 to 12/25/2021 for acute on chronic CHF and atrial fibrillation with RVR.  Echo showed LVEF of 25-30% with global hypokinesis, low normal RV systolic function, severe left atrial enlargement, severe MR, mild to moderate TR.  She is diuresed with IV Lasix and transition to PO.  GDMT was adjusted.  She was referred to the Atrial Fibrillation Clinic for consideration of Tikosyn versus ablation.  Cardiomyopathy BP was still  felt to be tachycardia mediated in nature.   She was seen by Dr. Gasper Lloyd in the patient's CHF clinic on 01/15/2022 at which time she continued to report dyspnea with excessive exertion and due to fear of dyspnea reported remaining at home for the past several weeks.  She denies any dyspnea at rest and was otherwise doing okay from a cardiac standpoint.  Cardiomyopathy was felt to be nonischemic in nature from persistent atrial fibrillation and her severe MR was felt to be functional in nature; however, outpatient TEE and right/ left heart catheterization were recommended for further evaluation with plans to see the Structural Heart Team following this. LHC is currently scheduled for 02/26/2021.  She was seen in the Atrial Fibrillation Clinic on 01/23/2022 at which time she remained in rate controlled trial fibrillation.  She denied missing any doses of anticoagulant regulation in the past 3 weeks and was admitted for Tikosyn load.  Hospital Course     Consultants: None  Persistent Atrial Fibrillation Patient was admitted for Tikosyn load as described above.  She converted to sinus rhythm after initiation of Tikosyn.  Electrolytes were replaced as needed per protocol.  She was noted to have a 4.2 second post conversion pause and has been in sinus bradycardia therefore Coreg and Digoxin were stopped. QTc remained stable.  She will be discharged on Tikosyn 500 mcg twice daily. She was on high dose Coreg 25mg  twice daily prior to admission so she was restarted on 3.125mg  twice daily prior to discharge to avoid help avoid abrupt discontinuation. She has close follow-up arranged in the A.Fib Clinic and  CHF Clinic.  Chronic Combined CHF Echo on 12/21/2021 showed LVEF of 25-30% with global hypokinesis, low normal RV systolic function, severe left atrial enlargement, severe MR, mild to moderate TR. Felt to likely be tachycardia mediated cardiomyopathy.  She has remained compensated from a heart failure  standpoint this admission.  Home Coreg and Digoxin were stopped due to postconversion pauses and sinus bradycardia. However, Coreg was restarted at 3.125mg  twice daily prior to discharge as above. Will remain off Digoxin. Continue home Lasix 40 mg daily, Entresto 97-103 mg twice daily, Spironolactone 12.5 mg daily, and Farxiga 10 mg daily.  She has close follow-up in the CHF clinic arranged.  Severe Mitral Regurgitation Noted on echo on 12/21/2021.  Felt to likely be functional MR.  However, he has plans for an outpatient TEE and right/left cardiac catheterization on 02/26/2022 and then will see Structural Heart Team.  Hypertension BP well controlled. Continue medications for CHF as above.   Patient seen and examined by Dr. Graciela Husbands today and felt to be stable for discharge. Outpatient follow-up arranged. Medications as below.  EP team called patient's Pharmacy yesterday and confirmed they have patient's dose of Tikosyn available. Pharmacy is open 24/7. Patient is aware that she needs to go and pick this up today. Of note, patient thought she needed a refill of her Methimazole and Spironolactone; however, it looks like she recently had a 90 day prescription of both of these sent in on 01/10/2022 with refills. I tried to call the Pharmacy multiple times without success (was placed on hold each time for long period of time). Asked patient to check with the Pharmacy when she goes to pick up the Tikosyn and let us know if there is an issue.  Did the patient have an acute coronary syndrome (MI, NSTEMI, STEMI, etc) this admission?:  No                               Did the patient have a percutaneous coronary intervention (stent / angioplasty)?:  No.    _____________  Discharge Vitals Blood pressure 103/66, pulse (!) 56, temperature 98.2 F (36.8 C), temperature source Oral, resp. rate 18, height 6' (1.829 m), weight 83.6 kg, last menstrual period 06/10/2011, SpO2 95 %.  Filed Weights   01/24/22 1008   Weight: 83.6 kg    Labs & Radiologic Studies    CBC No results for input(s): "WBC", "NEUTROABS", "HGB", "HCT", "MCV", "PLT" in the last 72 hours. Basic Metabolic Panel Recent Labs    92/33/00 0207 01/27/22 0143  NA 140 142  K 4.1 4.4  CL 105 107  CO2 26 28  GLUCOSE 104* 106*  BUN 16 15  CREATININE 1.02* 0.97  CALCIUM 9.2 9.1  MG 2.0 2.1   Liver Function Tests No results for input(s): "AST", "ALT", "ALKPHOS", "BILITOT", "PROT", "ALBUMIN" in the last 72 hours. No results for input(s): "LIPASE", "AMYLASE" in the last 72 hours. High Sensitivity Troponin:   No results for input(s): "TROPONINIHS" in the last 720 hours.  BNP Invalid input(s): "POCBNP" D-Dimer No results for input(s): "DDIMER" in the last 72 hours. Hemoglobin A1C No results for input(s): "HGBA1C" in the last 72 hours. Fasting Lipid Panel No results for input(s): "CHOL", "HDL", "LDLCALC", "TRIG", "CHOLHDL", "LDLDIRECT" in the last 72 hours. Thyroid Function Tests No results for input(s): "TSH", "T4TOTAL", "T3FREE", "THYROIDAB" in the last 72 hours.  Invalid input(s): "FREET3" _____________  No results found. Disposition  Patient is being discharged home today in good condition.  Follow-up Plans & Appointments     Follow-up Information     Fenton, Clint R, PA Follow up.   Specialty: Cardiology Why: Hospital follow-up in the Atrial Fibrillation Clinic scheduled for 02/06/2022 at 3:30pm. Contact information: [address] [place] [address] [phone]         Dorthula Nettles, DO Follow up.   Specialty: Cardiology Why: Follow-up with Dr. Gasper Lloyd schduled for 02/09/2022 at 11:40am. Contact information: [address] [address] [phone]                Discharge Instructions     Diet - low sodium heart healthy   Complete by: As directed    Increase activity slowly   Complete by: As directed         Discharge Medications   Allergies as of 01/27/2022   No  Known Allergies      Medication List     STOP taking these medications    digoxin 0.125 MG tablet Commonly known as: LANOXIN       TAKE these medications    carvedilol 3.125 MG tablet Commonly known as: Coreg Take 1 tablet (3.125 mg total) by mouth 2 (two) times daily. What changed:  medication strength how much to take when to take this   dapagliflozin propanediol 10 MG Tabs tablet Commonly known as: FARXIGA Take 1 tablet (10 mg total) by mouth daily.   dofetilide 500 MCG capsule Commonly known as: TIKOSYN Take 1 capsule (500 mcg total) by mouth 2 (two) times daily.   Eliquis 5 MG Tabs tablet Generic drug: apixaban TAKE 1 TABLET BY MOUTH TWICE  DAILY   Entresto 97-103 MG Generic drug: sacubitril-valsartan TAKE 1 TABLET BY MOUTH TWICE  DAILY   furosemide 40 MG tablet Commonly known as: LASIX Take 1 tablet (40 mg total) by mouth daily.   methimazole 5 MG tablet Commonly known as: TAPAZOLE TAKE 1 TABLET(5 MG) BY MOUTH DAILY   spironolactone 25 MG tablet Commonly known as: ALDACTONE Take 0.5 tablets (12.5 mg total) by mouth daily.           Outstanding Labs/Studies   N/A  Duration of Discharge Encounter   Greater than 30 minutes including physician time.  Signed, Corrin Parker, PA-C 01/27/2022, 12:51 PM   See separate note

## 2022-01-27 NOTE — Discharge Instructions (Signed)
Medication Changes: - START Tikosyn 500 mcg twice daily. It is very important that you go and pick this prescription up to day and take evening dose as directed. - DECREASE Carvedilol (Coreg) to 3.125mg  twice daily. - STOP Digoxin. - Continue all other home medications as listed elsewhere on discharge paperwork.

## 2022-01-27 NOTE — Progress Notes (Addendum)
Rounding Note    Patient Name: Joanna [name] Date of Encounter: 01/27/2022  [facility] Cardiologist: Mertie Moores, MD  HF: Dr. Daniel Nones  Subjective  Feels pretty good  Inpatient Medications    Scheduled Meds:  apixaban  5 mg Oral BID   dapagliflozin propanediol  10 mg Oral Daily   dofetilide  500 mcg Oral BID   furosemide  40 mg Oral Daily   methimazole  5 mg Oral Daily   sacubitril-valsartan  1 tablet Oral BID   sodium chloride flush  3 mL Intravenous Q12H   spironolactone  12.5 mg Oral Daily   Continuous Infusions:  sodium chloride Stopped (01/26/22 1139)   sodium chloride     PRN Meds: sodium chloride, acetaminophen, sodium chloride flush   Vital Signs    Vitals:   01/26/22 0447 01/26/22 1741 01/26/22 2159 01/27/22 0613  BP: 92/60 96/84 125/78 103/66  Pulse: (!) 51 (!) 51 (!) 51 (!) 56  Resp: _0 Temp: 98.2 F (36.8 C) 98.1 F (36.7 C) 97.6 F (36.4 C) 98.2 F (36.8 C)  TempSrc: Oral Oral Oral Oral  SpO2: 96%  99% 95%  Weight:      Height:        Intake/Output Summary (Last 24 hours) at 01/27/2022 1016 Last data filed at 01/27/2022 0457 Gross per 24 hour  Intake 377.83 ml  Output --  Net 377.83 ml       01/24/2022   10:08 AM 01/23/2022   10:49 AM 01/16/2022    2:24 PM  Last 3 Weights  Weight (lbs) 184 lb 3.2 oz 184 lb 186 lb 9.6 oz  Weight (kg) 83.553 kg 83.462 kg 84.641 kg      Telemetry    SB 40's -50's, some 50's had a 4.2 second post conversion pause-noted yesterday, carvedilol and digoxin held, half-lives are long enough so that we will take a couple of days for this to normalize, today,sinus with rates into 40's but no pause  ECG    Sinus with prolonged p wave   Physical Exam   Well developed and well nourished in no acute distress HENT normal Neck supple   Clear Device pocket well healed; without hematoma or erythema.  There is no tethering  Regular rate and rhythm,  murmur Abd-soft with active  BS No Clubbing cyanosis   edema  arachnodactyly and joint hyperflexibility  Skin-warm and dry A & Oriented  Grossly normal sensory and motor function       Labs    High Sensitivity Troponin:  No results for input(s): "TROPONINIHS" in the last 720 hours.   Chemistry Recent Labs  Lab 01/25/22 0218 01/26/22 0207 01/27/22 0143  NA 142 140 142  K 4.5 4.1 4.4  CL 104 105 107  CO2 _1 GLUCOSE 132* 104* 106*  BUN _2 CREATININE 0.87 1.02* 0.97  CALCIUM 9.3 9.2 9.1  MG 2.2 2.0 2.1  GFRNONAA >60 >60 >60  ANIONGAP _3 Lipids No results for input(s): "CHOL", "TRIG", "HDL", "LABVLDL", "LDLCALC", "CHOLHDL" in the last 168 hours.  HematologyNo results for input(s): "WBC", "RBC", "HGB", "HCT", "MCV", "MCH", "MCHC", "RDW", "PLT" in the last 168 hours. Thyroid No results for input(s): "TSH", "FREET4" in the last 168 hours.  BNPNo results for input(s): "BNP", "PROBNP" in the last 168 hours.  DDimer No results for input(s): "DDIMER" in the last 168 hours.   Radiology  No results found.  Cardiac Studies    12/21/21: TTE 1. No significant change from echo done in 2020.   2. Left ventricular ejection fraction, by estimation, is 25 to 30%. The  left ventricle has severely decreased function. The left ventricle  demonstrates global hypokinesis. The left ventricular internal cavity size  was moderately to severely dilated.  There is mild left ventricular hypertrophy. Left ventricular diastolic  parameters are indeterminate.   3. Right ventricular systolic function is low normal. The right  ventricular size is normal. There is moderately elevated pulmonary artery  systolic pressure.   4. Left atrial size was severely dilated.   5. MR is eccentric, posteriorly directed into LA . The mitral valve is  abnormal. Severe mitral valve regurgitation.   6. Tricuspid valve regurgitation is mild to moderate.   7. The aortic valve is tricuspid. Aortic valve regurgitation is  trivial.  Aortic valve sclerosis is present, with no evidence of aortic valve  stenosis.   8. The inferior vena cava is dilated in size with <50% respiratory  variability, suggesting right atrial pressure of 15 mmHg.   Patient Profile     61 y.o. female w/PMHx of HTN, NICM, chronic CHF (systolic), VHD (severe MR, functional) AFib admitted for Tikosyn  Assessment & Plan  Atrial fibrillation-persistent  Dofetilide for the above  Nonischemic cardiomyopathy  Sinus bradycardia  Congestive heart failure-chronic  Valvular heart disease with functional severe MR/ MAC    Hypertension  Arachnodactyly   Carvedilol stopped abruptly from 25 bid   will discharge on lw dose  3.125; hold dig Euvolemic  Sinus brady may be largely iatrogenic (60 6/22) and will need followup-- maybe monitor placed at afib clnic if any questions, by then dig will be gone  Continue dofetilide at 500 bid--     Signed, Virl Axe, MD  01/27/2022, 10:16 AM

## 2022-02-06 ENCOUNTER — Ambulatory Visit (HOSPITAL_COMMUNITY): Payer: Medicare Other | Admitting: Physician Assistant

## 2022-02-09 ENCOUNTER — Encounter (HOSPITAL_COMMUNITY): Payer: Self-pay | Admitting: Physician Assistant

## 2022-02-09 ENCOUNTER — Encounter (HOSPITAL_COMMUNITY): Payer: Medicare Other | Admitting: Cardiology

## 2022-02-09 ENCOUNTER — Ambulatory Visit (HOSPITAL_COMMUNITY)
Admission: RE | Admit: 2022-02-09 | Discharge: 2022-02-09 | Disposition: A | Discharge status: Home / Self Care | Payer: Medicare Other | Source: Ambulatory Visit | Attending: Physician Assistant | Admitting: Physician Assistant

## 2022-02-09 VITALS — BP 128/88 | HR 75 | Ht 72.0 in | Wt 183.2 lb

## 2022-02-09 DIAGNOSIS — Z7984 Long term (current) use of oral hypoglycemic drugs: Secondary | ICD-10-CM | POA: Insufficient documentation

## 2022-02-09 DIAGNOSIS — I498 Other specified cardiac arrhythmias: Secondary | ICD-10-CM | POA: Insufficient documentation

## 2022-02-09 DIAGNOSIS — I38 Endocarditis, valve unspecified: Secondary | ICD-10-CM | POA: Insufficient documentation

## 2022-02-09 DIAGNOSIS — Z7901 Long term (current) use of anticoagulants: Secondary | ICD-10-CM | POA: Insufficient documentation

## 2022-02-09 DIAGNOSIS — Z79899 Other long term (current) drug therapy: Secondary | ICD-10-CM | POA: Insufficient documentation

## 2022-02-09 DIAGNOSIS — D6869 Other thrombophilia: Secondary | ICD-10-CM

## 2022-02-09 DIAGNOSIS — I4819 Other persistent atrial fibrillation: Secondary | ICD-10-CM

## 2022-02-09 DIAGNOSIS — I110 Hypertensive heart disease with heart failure: Secondary | ICD-10-CM | POA: Insufficient documentation

## 2022-02-09 DIAGNOSIS — I5042 Chronic combined systolic (congestive) and diastolic (congestive) heart failure: Secondary | ICD-10-CM | POA: Insufficient documentation

## 2022-02-09 DIAGNOSIS — F17210 Nicotine dependence, cigarettes, uncomplicated: Secondary | ICD-10-CM | POA: Insufficient documentation

## 2022-02-09 LAB — BASIC METABOLIC PANEL
Anion gap: 15 (ref 5–15)
BUN: 24 mg/dL — ABNORMAL HIGH (ref 8–23)
CO2: 16 mmol/L — ABNORMAL LOW (ref 22–32)
Calcium: 9.3 mg/dL (ref 8.9–10.3)
Chloride: 110 mmol/L (ref 98–111)
Creatinine, Ser: 0.99 mg/dL (ref 0.44–1.00)
GFR, Estimated: >60 mL/min (ref >60)
Glucose, Bld: 88 mg/dL (ref 70–99)
Potassium: 3.7 mmol/L (ref 3.5–5.1)
Sodium: 141 mmol/L (ref 135–145)

## 2022-02-09 LAB — MAGNESIUM: Magnesium: 2.1 mg/dL (ref 1.7–2.4)

## 2022-02-09 NOTE — Progress Notes (Signed)
Primary Care Physician: Ladell Pier, MD Primary Cardiologist: Dr Acie Fredrickson Primary Electrophysiologist: Dr Curt [facility]: Dr Daniel Nones Referring Physician: Leanor Kail PA   Joanna [name] is a 62 y.o. female with a history of HTN, combined chronic systolic and diastolic CHF, tobacco abuse, MR, and persistent atrial fibrillation who presents for follow up in the [facility].  The patient was initially diagnosed with atrial fibrillation on 03/08/18 at the ER after presenting with symptoms of dyspnea on exertion for one week prior. ECG at ER showed afib with RVR and she was given Lopressor and started on Cardizem drip and heparin. She underwent successful DCCV. Echo on admission showed severely reduced EF 20-25%, possibly tachycardia mediated. Discharged on Eliquis and Lopressor. On follow up, she was noted to be in afib again with RVR. Started on amiodarone which did well keeping her in SR. Unfortunately, she developed amiodarone induced hyperthyroidism and this was discontinued 10/16/21.  Patient was hospitalized 12/20/21 with acute CHF in the setting of rapid Afib. Echo showed EF back down again, 25-30% w/ global HK, LV mod-severely dilated, LA severely dilated w/ eccentric MR, posteriorly directed into LA, severe regurgitation. She was diuresed w/ IV Lasix and transitioned to PO with additions of Farxiga and spironolactone. Treated w/ Coreg and digoxin for rate control.   On follow up today, patient is s/p dofetilide admission 12/20-12/23/23. She converted with the medication and did not require DCCV. She had a post termination pause lasting 4.2 seconds, digoxin was discontinued and carvedilol decreased. Patient remains in [place] today and is feeling well. She walked into the clinic instead of using the wheelchair.   Today, she denies symptoms of palpitations, chest pain, orthopnea, PND, lower extremity edema, dizziness, presyncope, syncope, snoring, daytime  somnolence, bleeding, or neurologic sequela. The patient is tolerating medications without difficulties and is otherwise without complaint today.    Atrial Fibrillation Risk Factors:  she does not have symptoms or diagnosis of sleep apnea. she does not have a history of rheumatic fever. she does not have a history of alcohol use. The patient does not have a history of early familial atrial fibrillation or other arrhythmias.  she has a BMI of Body mass index is 24.85 kg/m.Marland Kitchen Filed Weights   02/09/22 1102  Weight: 83.1 kg    Family History  Problem Relation Age of Onset   Cancer Mother    Heart disease Father      Atrial Fibrillation Management history:  Previous antiarrhythmic drugs: amiodarone, dofetilide  Previous cardioversions: 03/14/2018 Previous ablations: none CHADS2VASC score: 3  Anticoagulation history: Eliquis   Past Medical History:  Diagnosis Date   Atrial fibrillation with RVR (HCC)    Chronic combined systolic (congestive) and diastolic (congestive) heart failure (HCC)    Dyspnea    Goiter    Hypertension    NICM (nonischemic cardiomyopathy) ([place])    a. 08/2005 Echo: EF 30-35%, mod diff HK. Mild to Mod MR. Mildly dil LA; b. 08/2005 Cath: Nl Cors. Elevated CO w/o shunt; c. 09/2007 Echo: EF 45%. Mild to mod MR; c. 03/2015 Echo: EF 45%, global HK. Gr1 DD. Mild MR. Mildly dil LA.   Past Surgical History:  Procedure Laterality Date   APPENDECTOMY     BACK SURGERY     CARDIOVERSION N/A 03/14/2018   Procedure: CARDIOVERSION;  Surgeon: Fay Records, MD;  Location: [place];  Service: Cardiovascular;  Laterality: N/A;   TEE WITHOUT CARDIOVERSION N/A 03/14/2018   Procedure: TRANSESOPHAGEAL ECHOCARDIOGRAM (TEE);  Surgeon: Fay Records, MD;  Location[facility] ENDOSCOPY;  Service: Cardiovascular;  Laterality: N/A;    Current Outpatient Medications  Medication Sig Dispense Refill   carvedilol (COREG) 3.125 MG tablet Take 1 tablet (3.125 mg total) by mouth 2 (two) times  daily. 60 tablet 2   dapagliflozin propanediol (FARXIGA) 10 MG TABS tablet Take 1 tablet (10 mg total) by mouth daily. 90 tablet 11   dofetilide (TIKOSYN) 500 MCG capsule Take 1 capsule (500 mcg total) by mouth 2 (two) times daily. 60 capsule 5   ELIQUIS 5 MG TABS tablet TAKE 1 TABLET BY MOUTH TWICE  DAILY 200 tablet 2   ENTRESTO 97-103 MG TAKE 1 TABLET BY MOUTH TWICE  DAILY 200 tablet 2   furosemide (LASIX) 40 MG tablet Take 1 tablet (40 mg total) by mouth daily. 90 tablet 3   methimazole (TAPAZOLE) 5 MG tablet TAKE 1 TABLET(5 MG) BY MOUTH DAILY 90 tablet 1   spironolactone (ALDACTONE) 25 MG tablet Take 0.5 tablets (12.5 mg total) by mouth daily. 45 tablet 3   No current facility-administered medications for this encounter.    No Known Allergies   Social History   Socioeconomic History   Marital status: Single    Spouse name: Not on file   Number of children: 1   Years of education: Not on file   Highest education level: High school graduate  Occupational History   Occupation: unemployed    Comment: on disablity  Tobacco Use   Smoking status: Every Day    Packs/day: 2.00    Years: 30.00    Total pack years: 60.00    Types: Cigarettes    Last attempt to quit: 02/08/2018    Years since quitting: 4.0   Smokeless tobacco: Never   Tobacco comments:    3 cigarettes daily 02/09/22  Vaping Use   Vaping Use: Never used  Substance and Sexual Activity   Alcohol use: No    Comment: quit 2 years ago   Drug use: No   Sexual activity: Not on file  Other Topics Concern   Not on file  Social History Narrative   Not on file   Social Determinants of Health   Financial Resource Strain: Low Risk  (12/22/2021)   Overall Financial Resource Strain (CARDIA)    Difficulty of Paying Living Expenses: Not very hard  Food Insecurity: No Food Insecurity (12/26/2021)   Hunger Vital Sign    Worried About Running Out of Food in the Last Year: Never true    [place] in the Last Year:  Never true  Transportation Needs: No Transportation Needs (12/26/2021)   PRAPARE - Hydrologist (Medical): No    Lack of Transportation (Non-Medical): No  Physical Activity: Not on file  Stress: Not on file  Social Connections: Not on file  Intimate Partner Violence: Not At Risk (12/21/2021)   Humiliation, Afraid, Rape, and Kick questionnaire    Fear of Current or Ex-Partner: No    Emotionally Abused: No    Physically Abused: No    Sexually Abused: No     ROS- All systems are reviewed and negative except as per the HPI above.  Physical Exam: Vitals:   02/09/22 1102  BP: 128/88  Pulse: 75  Weight: 83.1 kg  Height: 6' (1.829 m)    GEN- The patient is a well appearing female, alert and oriented x 3 today.   HEENT-head normocephalic, atraumatic, sclera clear, conjunctiva pink, hearing  intact, trachea midline. Lungs- Clear to ausculation bilaterally, normal work of breathing Heart- Regular rate and rhythm, no rubs or gallops, 2/6 systolic murmur  GI- soft, NT, ND, + BS Extremities- no clubbing, cyanosis, or edema MS- no significant deformity or atrophy Skin- no rash or lesion Psych- euthymic mood, full affect Neuro- strength and sensation are intact   Wt Readings from Last 3 Encounters:  02/09/22 83.1 kg  01/24/22 83.6 kg  01/23/22 83.5 kg    EKG today demonstrates SR, NST Vent. rate 75 BPM PR interval 172 ms QRS duration 90 ms QT/QTcB 412/460 ms  Echo 12/21/21 demonstrated   1. No significant change from echo done in 2020.   2. Left ventricular ejection fraction, by estimation, is 25 to 30%. The  left ventricle has severely decreased function. The left ventricle  demonstrates global hypokinesis. The left ventricular internal cavity size  was moderately to severely dilated.  There is mild left ventricular hypertrophy. Left ventricular diastolic  parameters are indeterminate.   3. Right ventricular systolic function is low normal. The  right  ventricular size is normal. There is moderately elevated pulmonary artery  systolic pressure.   4. Left atrial size was severely dilated.   5. MR is eccentric, posteriorly directed into LA . The mitral valve is  abnormal. Severe mitral valve regurgitation.   6. Tricuspid valve regurgitation is mild to moderate.   7. The aortic valve is tricuspid. Aortic valve regurgitation is trivial.  Aortic valve sclerosis is present, with no evidence of aortic valve  stenosis.   8. The inferior vena cava is dilated in size with <50% respiratory  variability, suggesting right atrial pressure of 15 mmHg.   Epic records are reviewed at length today   CHA2DS2-VASc Score = 3  The patient's score is based upon: CHF History: 1 HTN History: 1 Diabetes History: 0 Stroke History: 0 Vascular Disease History: 0 Age Score: 0 Gender Score: 1       ASSESSMENT AND PLAN: 1. Persistent Atrial Fibrillation (ICD10:  I48.19) The patient's CHA2DS2-VASc score is 3, indicating a 3.2% annual risk of stroke.   Patient off amiodarone due to hyperthyroidism, on methimazole.  S/p dofetilide admission 12/20-12/23/23 Patient appears to be maintaining SR. Continue dofetilide 500 mcg BID. QT stable. Check bmet/mag today. Continue Eliquis 5 mg BID Continue carvedilol 3.125 mg BID  2. Secondary Hypercoagulable State (ICD10:  D68.69) The patient is at significant risk for stroke/thromboembolism based upon her CHA2DS2-VASc Score of 3.  Continue Apixaban (Eliquis).   3. Chronic systolic CHF Suspected tachycardia mediated.  GDMT per [facility], followed by Dr Gasper Lloyd. Appears euvolemic today.   4. Valvular heart disease Severe MR TEE scheduled to evaluate per Dr Gasper Lloyd.   Follow up with Francis Dowse as scheduled. AF clinic in 4 months.    Jorja Loa PA-C Af[facility] [address] [place], [place] [address] [phone] 02/09/2022 12:56 PM

## 2022-02-12 ENCOUNTER — Ambulatory Visit (HOSPITAL_COMMUNITY)
Admission: RE | Admit: 2022-02-12 | Discharge: 2022-02-12 | Disposition: A | Discharge status: Home / Self Care | Payer: Medicare Other | Source: Ambulatory Visit | Attending: Cardiology | Admitting: Cardiology

## 2022-02-12 ENCOUNTER — Encounter (HOSPITAL_COMMUNITY): Payer: Self-pay | Admitting: Cardiology

## 2022-02-12 VITALS — BP 110/70 | HR 86 | Wt 181.8 lb

## 2022-02-12 DIAGNOSIS — T462X1S Poisoning by other antidysrhythmic drugs, accidental (unintentional), sequela: Secondary | ICD-10-CM | POA: Diagnosis not present

## 2022-02-12 DIAGNOSIS — Z79899 Other long term (current) drug therapy: Secondary | ICD-10-CM | POA: Insufficient documentation

## 2022-02-12 DIAGNOSIS — F17210 Nicotine dependence, cigarettes, uncomplicated: Secondary | ICD-10-CM | POA: Diagnosis not present

## 2022-02-12 DIAGNOSIS — I4819 Other persistent atrial fibrillation: Secondary | ICD-10-CM | POA: Insufficient documentation

## 2022-02-12 DIAGNOSIS — I428 Other cardiomyopathies: Secondary | ICD-10-CM | POA: Diagnosis not present

## 2022-02-12 DIAGNOSIS — I110 Hypertensive heart disease with heart failure: Secondary | ICD-10-CM | POA: Diagnosis not present

## 2022-02-12 DIAGNOSIS — I340 Nonrheumatic mitral (valve) insufficiency: Secondary | ICD-10-CM | POA: Diagnosis not present

## 2022-02-12 DIAGNOSIS — I5042 Chronic combined systolic (congestive) and diastolic (congestive) heart failure: Secondary | ICD-10-CM | POA: Diagnosis not present

## 2022-02-12 DIAGNOSIS — Z7984 Long term (current) use of oral hypoglycemic drugs: Secondary | ICD-10-CM | POA: Insufficient documentation

## 2022-02-12 DIAGNOSIS — E0590 Thyrotoxicosis, unspecified without thyrotoxic crisis or storm: Secondary | ICD-10-CM | POA: Diagnosis not present

## 2022-02-12 DIAGNOSIS — I5022 Chronic systolic (congestive) heart failure: Secondary | ICD-10-CM | POA: Diagnosis not present

## 2022-02-12 DIAGNOSIS — Z7901 Long term (current) use of anticoagulants: Secondary | ICD-10-CM | POA: Diagnosis not present

## 2022-02-12 MED ORDER — CARVEDILOL 6.25 MG PO TABS: 6.2500 mg | ORAL_TABLET | Freq: Two times a day (BID) | ORAL | 3 refills | Status: DC

## 2022-02-12 NOTE — Progress Notes (Signed)
 ADVANCED HEART FAILURE CLINIC NOTE  Referring Physician: Johnson, Deborah B, MD  Primary Care: Johnson, Deborah B, MD Primary Cardiologist: Philip Nahser  HPI: Joanna [name] is a 61 y.o. female with hypertension, long-term persistent atrial fibrillation and nonischemic cardiomyopathy with EF as low as 20 to 25% presenting today to establish care. Joanna [name] reports being diagnosed with heart failure for at least 10 years now with cath in 2007 demonstrating normal coronaries. She has had several admissions at [facility] dating back to 2020 when she required intubation for hypertensive urgency associated with pulmonary edema and atrial fibrillation. 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 During that admission, TTE w/ LVEF 25%-30% & severely dilated LA w/ severe eccentric MR. Since that time she has been seen in our TOC clinic with addition of some GDMT.   Interval hx:  Since her last appointment and addition of GDMT Joanna [name] has been doing remarkably well.  She reports being able to walk around [employer] with minimal dyspnea, no longer having PND or lower extremity edema.  She has seen EP and is now on Tikosyn for rhythm control.  Has maintained normal sinus rhythm since their appointment.  Activity level/exercise tolerance:  NYHA IIB Orthopnea:  Sleeps on 2- Paroxysmal noctural dyspnea: No Chest pain/pressure:  No Orthostatic lightheadedness:  No Palpitations: No Lower extremity edema:  Minimal Presyncope/syncope:  No Cough:  No  Past Medical History:  Diagnosis Date   Atrial fibrillation with RVR (HCC)    Chronic combined systolic (congestive) and diastolic (congestive) heart failure (HCC)    Dyspnea    Goiter    Hypertension    NICM (nonischemic cardiomyopathy) (HCC)    a. 08/2005 Echo: EF 30-35%, mod diff HK. Mild to Mod MR. Mildly dil LA; b.  08/2005 Cath: Nl Cors. Elevated CO w/o shunt; c. 09/2007 Echo: EF 45%. Mild to mod MR; c. 03/2015 Echo: EF 45%, global HK. Gr1 DD. Mild MR. Mildly dil LA.    Current Outpatient Medications  Medication Sig Dispense Refill   dapagliflozin propanediol (FARXIGA) 10 MG TABS tablet Take 1 tablet (10 mg total) by mouth daily. 90 tablet 11   dofetilide (TIKOSYN) 500 MCG capsule Take 1 capsule (500 mcg total) by mouth 2 (two) times daily. 60 capsule 5   ELIQUIS 5 MG TABS tablet TAKE 1 TABLET BY MOUTH TWICE  DAILY 200 tablet 2   ENTRESTO 97-103 MG TAKE 1 TABLET BY MOUTH TWICE  DAILY 200 tablet 2   furosemide (LASIX) 40 MG tablet Take 1 tablet (40 mg total) by mouth daily. 90 tablet 3   methimazole (TAPAZOLE) 5 MG tablet TAKE 1 TABLET(5 MG) BY MOUTH DAILY 90 tablet 1   spironolactone (ALDACTONE) 25 MG tablet Take 0.5 tablets (12.5 mg total) by mouth daily. 45 tablet 3   carvedilol (COREG) 6.25 MG tablet Take 1 tablet (6.25 mg total) by mouth 2 (two) times daily. 180 tablet 3   No current facility-administered medications for this encounter.    No Known Allergies    Social History   Socioeconomic History   Marital status: Single    Spouse name: Not on file   Number of children: 1   Years of education: Not on file   Highest education level: High school graduate  Occupational History   Occupation: unemployed    Comment: on disablity  Tobacco Use   Smoking status: Every Day      Packs/day: 2.00    Years: 30.00    Total pack years: 60.00    Types: Cigarettes    Last attempt to quit: 02/08/2018    Years since quitting: 4.0   Smokeless tobacco: Never   Tobacco comments:    3 cigarettes daily 02/09/22  Vaping Use   Vaping Use: Never used  Substance and Sexual Activity   Alcohol use: No    Comment: quit 2 years ago   Drug use: No   Sexual activity: Not on file  Other Topics Concern   Not on file  Social History Narrative   Not on file   Social Determinants of Health   Financial Resource  Strain: Low Risk  (12/22/2021)   Overall Financial Resource Strain (CARDIA)    Difficulty of Paying Living Expenses: Not very hard  Food Insecurity: No Food Insecurity (12/26/2021)   Hunger Vital Sign    Worried About Running Out of Food in the Last Year: Never true    Ran Out of Food in the Last Year: Never true  Transportation Needs: No Transportation Needs (12/26/2021)   PRAPARE - Hydrologist (Medical): No    Lack of Transportation (Non-Medical): No  Physical Activity: Not on file  Stress: Not on file  Social Connections: Not on file  Intimate Partner Violence: Not At Risk (12/21/2021)   Humiliation, Afraid, Rape, and Kick questionnaire    Fear of Current or Ex-Partner: No    Emotionally Abused: No    Physically Abused: No    Sexually Abused: No      Family History  Problem Relation Age of Onset   Cancer Mother    Heart disease Father     PHYSICAL EXAM: Vitals:   02/12/22 0910  BP: 110/70  Pulse: 86  SpO2: 99%    GENERAL: Thin African-American female in no apparent distress HEENT: Negative for arcus senilis or xanthelasma. There is no scleral icterus.  The mucous membranes are pink and moist.   NECK: Supple, No masses. Normal carotid upstrokes without bruits. No masses or thyromegaly.    CHEST: There are no chest wall deformities. There is no chest wall tenderness. Respirations are unlabored.  Lungs-CTA bilaterally CARDIAC:  JVP: Less than 8 cm         Normal S1, S2  Normal rate with regular rhythm. 2/6 SEM murmur, rubs or gallops.  Pulses are 2+ and symmetrical in upper and lower extremities. no edema.  ABDOMEN: Soft, non-tender, non-distended. There are no masses or hepatomegaly. There are normal bowel sounds.  EXTREMITIES: Warm and well perfused with no cyanosis, clubbing.  LYMPHATIC: No axillary or supraclavicular lymphadenopathy.  NEUROLOGIC: Patient is oriented x3 with no focal or lateralizing neurologic deficits.  PSYCH: Patients  affect is appropriate, there is no evidence of anxiety or depression.  SKIN: Warm and dry; no lesions or wounds.   DATA REVIEW  WUJ:[id number] fibrillation 02/12/21: Normal sinus rhythm  ECHO: 12/21/21: LVEF 25% to 30%. Severe eccentric MR.  10/04/20: LVEF 55% - 60%  CATH:Pending   ASSESSMENT & PLAN:  NYHA IIB, Stage C-D, Systolic Heart Failure Etiology of NW:GNFAOZ nonischemic from persistent atrial fibrillation NYHA class / AHA Stage:IIB Volume status & Diuretics: Euvolemic, continue lasix 40mg  daily Vasodilators:Entresto 97/103mg  BID Beta-Blocker: Increase Coreg to 6.25 mg twice daily.  Previously decreased due to bradycardia. HYQ:[id number] 12.5mg  daily Cardiometabolic:start farxiga 10mg  daily Devices therapies & Valvulopathies: Followed with EP, now on dofetilide for rhythm control.  In normal sinus rhythm today.  Patient has severe left atrial remodeling and dilation leading to what is likely atrial functional MR.  Will plan for TEE and left heart cath followed by possible MitraClip. Advanced therapies:N/A  2. Severe mitral regurgitation  - Severe LA enlargement, likely atrial functional MR - Plan for TEE/LHC and evaluation for TEER after discussion with structural team.   3. Persistent atrial fibrillation  -Now on Tikosyn, followed by Dr. Elberta Fortis.  In normal sinus rhythm at this time.  Will plan for TEE in 2 weeks to assess severity of MR.  4. Amiodarone induced thyrotoxicosis - Follow with endocrinology  - Off amiodarone since 10/16/21 - s/p methimazole & prednisone  Joanna [name] Advanced Heart Failure Mechanical Circulatory Support

## 2022-02-12 NOTE — H&P (View-Only) (Signed)
ADVANCED HEART FAILURE CLINIC NOTE  Referring Physician: Ladell Pier, MD  Primary Care: Ladell Pier, MD Primary Cardiologist: Mertie Moores  HPI: Joanna [name] is a 62 y.o. female with hypertension, long-term persistent atrial fibrillation and nonischemic cardiomyopathy with EF as low as 20 to 25% presenting today to establish care. Joanna [name] reports being diagnosed with heart failure for at least 10 years now with cath in 2007 demonstrating normal coronaries. She has had several admissions at [facility] dating back to 2020 when she required intubation for hypertensive urgency associated with pulmonary edema and atrial fibrillation. 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 During that admission, TTE w/ LVEF 25%-30% & severely dilated LA w/ severe eccentric MR. Since that time she has been seen [facility] with addition of some GDMT.   Interval hx:  Since her last appointment and addition of GDMT Joanna [name] has been doing remarkably well.  She reports being able to walk around [place] with minimal dyspnea, no longer having PND or lower extremity edema.  She has seen EP and is now on Tikosyn for rhythm control.  Has maintained normal sinus rhythm since their appointment.  Activity level/exercise tolerance:  NYHA IIB Orthopnea:  Sleeps on 2- Paroxysmal noctural dyspnea: No Chest pain/pressure:  No Orthostatic lightheadedness:  No Palpitations: No Lower extremity edema:  Minimal Presyncope/syncope:  No Cough:  No  Past Medical History:  Diagnosis Date   Atrial fibrillation with RVR (HCC)    Chronic combined systolic (congestive) and diastolic (congestive) heart failure (HCC)    Dyspnea    Goiter    Hypertension    NICM (nonischemic cardiomyopathy) ([place])    a. 08/2005 Echo: EF 30-35%, mod diff HK. Mild to Mod MR. Mildly dil LA; b.  08/2005 Cath: Nl Cors. Elevated CO w/o shunt; c. 09/2007 Echo: EF 45%. Mild to mod MR; c. 03/2015 Echo: EF 45%, global HK. Gr1 DD. Mild MR. Mildly dil LA.    Current Outpatient Medications  Medication Sig Dispense Refill   dapagliflozin propanediol (FARXIGA) 10 MG TABS tablet Take 1 tablet (10 mg total) by mouth daily. 90 tablet 11   dofetilide (TIKOSYN) 500 MCG capsule Take 1 capsule (500 mcg total) by mouth 2 (two) times daily. 60 capsule 5   ELIQUIS 5 MG TABS tablet TAKE 1 TABLET BY MOUTH TWICE  DAILY 200 tablet 2   ENTRESTO 97-103 MG TAKE 1 TABLET BY MOUTH TWICE  DAILY 200 tablet 2   furosemide (LASIX) 40 MG tablet Take 1 tablet (40 mg total) by mouth daily. 90 tablet 3   methimazole (TAPAZOLE) 5 MG tablet TAKE 1 TABLET(5 MG) BY MOUTH DAILY 90 tablet 1   spironolactone (ALDACTONE) 25 MG tablet Take 0.5 tablets (12.5 mg total) by mouth daily. 45 tablet 3   carvedilol (COREG) 6.25 MG tablet Take 1 tablet (6.25 mg total) by mouth 2 (two) times daily. 180 tablet 3   No current facility-administered medications for this encounter.    No Known Allergies    Social History   Socioeconomic History   Marital status: Single    Spouse name: Not on file   Number of children: 1   Years of education: Not on file   Highest education level: High school graduate  Occupational History   Occupation: unemployed    Comment: on disablity  Tobacco Use   Smoking status: Every Day  Packs/day: 2.00    Years: 30.00    Total pack years: 60.00    Types: Cigarettes    Last attempt to quit: 02/08/2018    Years since quitting: 4.0   Smokeless tobacco: Never   Tobacco comments:    3 cigarettes daily 02/09/22  Vaping Use   Vaping Use: Never used  Substance and Sexual Activity   Alcohol use: No    Comment: quit 2 years ago   Drug use: No   Sexual activity: Not on file  Other Topics Concern   Not on file  Social History Narrative   Not on file   Social Determinants of Health   Financial Resource  Strain: Low Risk  (12/22/2021)   Overall Financial Resource Strain (CARDIA)    Difficulty of Paying Living Expenses: Not very hard  Food Insecurity: No Food Insecurity (12/26/2021)   Hunger Vital Sign    Worried About Running Out of Food in the Last Year: Never true    Ran Out of Food in the Last Year: Never true  Transportation Needs: No Transportation Needs (12/26/2021)   PRAPARE - Hydrologist (Medical): No    Lack of Transportation (Non-Medical): No  Physical Activity: Not on file  Stress: Not on file  Social Connections: Not on file  Intimate Partner Violence: Not At Risk (12/21/2021)   Humiliation, Afraid, Rape, and Kick questionnaire    Fear of Current or Ex-Partner: No    Emotionally Abused: No    Physically Abused: No    Sexually Abused: No      Family History  Problem Relation Age of Onset   Cancer Mother    Heart disease Father     PHYSICAL EXAM: Vitals:   02/12/22 0910  BP: 110/70  Pulse: 86  SpO2: 99%    GENERAL: Thin African-American female in no apparent distress HEENT: Negative for arcus senilis or xanthelasma. There is no scleral icterus.  The mucous membranes are pink and moist.   NECK: Supple, No masses. Normal carotid upstrokes without bruits. No masses or thyromegaly.    CHEST: There are no chest wall deformities. There is no chest wall tenderness. Respirations are unlabored.  Lungs-CTA bilaterally CARDIAC:  JVP: Less than 8 cm         Normal S1, S2  Normal rate with regular rhythm. 2/6 SEM murmur, rubs or gallops.  Pulses are 2+ and symmetrical in upper and lower extremities. no edema.  ABDOMEN: Soft, non-tender, non-distended. There are no masses or hepatomegaly. There are normal bowel sounds.  EXTREMITIES: Warm and well perfused with no cyanosis, clubbing.  LYMPHATIC: No axillary or supraclavicular lymphadenopathy.  NEUROLOGIC: Patient is oriented x3 with no focal or lateralizing neurologic deficits.  PSYCH: Patients  affect is appropriate, there is no evidence of anxiety or depression.  SKIN: Warm and dry; no lesions or wounds.   DATA REVIEW  WUJ:WJXBJY fibrillation 02/12/21: Normal sinus rhythm  ECHO: 12/21/21: LVEF 25% to 30%. Severe eccentric MR.  10/04/20: LVEF 55% - 60%  CATH:Pending   ASSESSMENT & PLAN:  NYHA IIB, Stage C-D, Systolic Heart Failure Etiology of NW:GNFAOZ nonischemic from persistent atrial fibrillation NYHA class / AHA Stage:IIB Volume status & Diuretics: Euvolemic, continue lasix 40mg  daily Vasodilators:Entresto 97/103mg  BID Beta-Blocker: Increase Coreg to 6.25 mg twice daily.  Previously decreased due to bradycardia. HYQ:[id number] 12.5mg  daily Cardiometabolic:start farxiga 10mg  daily Devices therapies & Valvulopathies: Followed with EP, now on dofetilide for rhythm control.  In normal sinus rhythm today.  Patient has severe left atrial remodeling and dilation leading to what is likely atrial functional MR.  Will plan for TEE and left heart cath followed by possible MitraClip. Advanced therapies:N/A  2. Severe mitral regurgitation  - Severe LA enlargement, likely atrial functional MR - Plan for TEE/LHC and evaluation for TEER after discussion with structural team.   3. Persistent atrial fibrillation  -Now on Tikosyn, followed by Dr. Elberta Fortis.  In normal sinus rhythm at this time.  Will plan for TEE in 2 weeks to assess severity of MR.  4. Amiodarone induced thyrotoxicosis - Follow with endocrinology  - Off amiodarone since 10/16/21 - s/p methimazole & prednisone  Ry Moody Advanced Heart Failure Mechanical Circulatory Support

## 2022-02-12 NOTE — Patient Instructions (Signed)
INCREASE Carvedilol to 6.25 mg Twice daily  Your physician recommends that you schedule a follow-up appointment in: 3 months ( April 2024)  ** please call the office in February to arrange your follow up appointment. **  If you have any questions or concerns before your next appointment please send [place] a message through [place] or call our office at [phone].    TO LEAVE A MESSAGE FOR THE NURSE SELECT OPTION 2, PLEASE LEAVE A MESSAGE INCLUDING: YOUR NAME DATE OF BIRTH CALL BACK NUMBER REASON FOR CALL**this is important as we prioritize the call backs  YOU WILL RECEIVE A CALL BACK THE SAME DAY AS LONG AS YOU CALL BEFORE 4:00 PM  At the [facility], you and your health needs are our priority. As part of our continuing mission to provide you with exceptional heart care, we have created designated Provider Care Teams. These Care Teams include your primary Cardiologist (physician) and Advanced Practice Providers (APPs- Physician Assistants and Nurse Practitioners) who all work together to provide you with the care you need, when you need it.   You may see any of the following providers on your designated Care Team at your next follow up: Dr Glori Bickers Dr Loralie Champagne Dr. Roxana Hires, NP Lyda Jester, [place] [facility] [place], [place] Forestine Na, NP Audry Riles, PharmD   Please be sure to bring in all your medications bottles to every appointment.

## 2022-02-13 ENCOUNTER — Other Ambulatory Visit (HOSPITAL_COMMUNITY): Payer: Self-pay | Admitting: *Deleted

## 2022-02-13 MED ORDER — POTASSIUM CHLORIDE ER 10 MEQ PO TBCR: 10.0000 meq | EXTENDED_RELEASE_TABLET | Freq: Every day | ORAL | 3 refills | Status: DC

## 2022-02-25 DIAGNOSIS — I480 Paroxysmal atrial fibrillation: Secondary | ICD-10-CM | POA: Diagnosis not present

## 2022-02-25 DIAGNOSIS — I5033 Acute on chronic diastolic (congestive) heart failure: Secondary | ICD-10-CM | POA: Diagnosis not present

## 2022-02-25 DIAGNOSIS — E064 Drug-induced thyroiditis: Secondary | ICD-10-CM | POA: Diagnosis not present

## 2022-02-25 DIAGNOSIS — J449 Chronic obstructive pulmonary disease, unspecified: Secondary | ICD-10-CM | POA: Diagnosis not present

## 2022-02-25 NOTE — Anesthesia Preprocedure Evaluation (Addendum)
Anesthesia Evaluation  Patient identified by MRN, date of birth, ID band Patient awake    Reviewed: Allergy & Precautions, NPO status , Patient's Chart, lab work & pertinent test results  Airway Mallampati: I  TM Distance: >3 FB Neck ROM: Full    Dental  (+) Loose, Chipped, Dental Advisory Given,    Pulmonary COPD, Current Smoker and Patient abstained from smoking.   Pulmonary exam normal breath sounds clear to auscultation       Cardiovascular hypertension, Pt. on home beta blockers and Pt. on medications +CHF  Normal cardiovascular exam+ dysrhythmias (eliquis) Atrial Fibrillation + Valvular Problems/Murmurs (severe MR) MR  Rhythm:Regular Rate:Normal  TTE 2023  1. No significant change from echo done in 2020.   2. Left ventricular ejection fraction, by estimation, is 25 to 30%. The  left ventricle has severely decreased function. The left ventricle  demonstrates global hypokinesis. The left ventricular internal cavity size  was moderately to severely dilated.  There is mild left ventricular hypertrophy. Left ventricular diastolic  parameters are indeterminate.   3. Right ventricular systolic function is low normal. The right  ventricular size is normal. There is moderately elevated pulmonary artery  systolic pressure.   4. Left atrial size was severely dilated.   5. MR is eccentric, posteriorly directed into LA . The mitral valve is  abnormal. Severe mitral valve regurgitation.   6. Tricuspid valve regurgitation is mild to moderate.   7. The aortic valve is tricuspid. Aortic valve regurgitation is trivial.  Aortic valve sclerosis is present, with no evidence of aortic valve  stenosis.   8. The inferior vena cava is dilated in size with <50% respiratory  variability, suggesting right atrial pressure of 15 mmHg.     Neuro/Psych negative neurological ROS  negative psych ROS   GI/Hepatic negative GI ROS, Neg liver ROS,,,   Endo/Other  negative endocrine ROS    Renal/GU negative Renal ROS  negative genitourinary   Musculoskeletal negative musculoskeletal ROS (+)    Abdominal   Peds  Hematology negative hematology ROS (+)   Anesthesia Other Findings   Reproductive/Obstetrics                             Anesthesia Physical Anesthesia Plan  ASA: 4  Anesthesia Plan: MAC   Post-op Pain Management:    Induction: Intravenous  PONV Risk Score and Plan: Propofol infusion and Treatment may vary due to age or medical condition  Airway Management Planned: Natural Airway  Additional Equipment:   Intra-op Plan:   Post-operative Plan:   Informed Consent: I have reviewed the patients History and Physical, chart, labs and discussed the procedure including the risks, benefits and alternatives for the proposed anesthesia with the patient or authorized representative who has indicated his/her understanding and acceptance.     Dental advisory given  Plan Discussed with: CRNA  Anesthesia Plan Comments:        Anesthesia Quick Evaluation

## 2022-02-26 ENCOUNTER — Encounter (HOSPITAL_COMMUNITY)
Admission: RE | Disposition: A | Discharge status: Home / Self Care | Payer: Self-pay | Source: Home / Self Care | Attending: Cardiology

## 2022-02-26 ENCOUNTER — Ambulatory Visit (HOSPITAL_COMMUNITY): Payer: 59 | Admitting: Anesthesiology

## 2022-02-26 ENCOUNTER — Ambulatory Visit (HOSPITAL_COMMUNITY)
Admission: RE | Admit: 2022-02-26 | Discharge: 2022-02-26 | Disposition: A | Discharge status: Home / Self Care | Payer: 59 | Attending: Cardiology | Admitting: Cardiology

## 2022-02-26 ENCOUNTER — Telehealth: Payer: Self-pay

## 2022-02-26 ENCOUNTER — Ambulatory Visit (HOSPITAL_BASED_OUTPATIENT_CLINIC_OR_DEPARTMENT_OTHER)
Admission: RE | Admit: 2022-02-26 | Discharge: 2022-02-26 | Disposition: A | Discharge status: Home / Self Care | Payer: 59 | Source: Ambulatory Visit | Attending: Cardiology | Admitting: Cardiology

## 2022-02-26 ENCOUNTER — Encounter (HOSPITAL_COMMUNITY): Payer: Self-pay | Admitting: Cardiology

## 2022-02-26 ENCOUNTER — Ambulatory Visit (HOSPITAL_BASED_OUTPATIENT_CLINIC_OR_DEPARTMENT_OTHER): Payer: 59 | Admitting: Anesthesiology

## 2022-02-26 DIAGNOSIS — J449 Chronic obstructive pulmonary disease, unspecified: Secondary | ICD-10-CM | POA: Insufficient documentation

## 2022-02-26 DIAGNOSIS — I081 Rheumatic disorders of both mitral and tricuspid valves: Secondary | ICD-10-CM | POA: Diagnosis not present

## 2022-02-26 DIAGNOSIS — F17210 Nicotine dependence, cigarettes, uncomplicated: Secondary | ICD-10-CM | POA: Diagnosis not present

## 2022-02-26 DIAGNOSIS — I110 Hypertensive heart disease with heart failure: Secondary | ICD-10-CM | POA: Diagnosis not present

## 2022-02-26 DIAGNOSIS — I340 Nonrheumatic mitral (valve) insufficiency: Secondary | ICD-10-CM

## 2022-02-26 DIAGNOSIS — I5042 Chronic combined systolic (congestive) and diastolic (congestive) heart failure: Secondary | ICD-10-CM | POA: Insufficient documentation

## 2022-02-26 DIAGNOSIS — I509 Heart failure, unspecified: Secondary | ICD-10-CM

## 2022-02-26 DIAGNOSIS — I5022 Chronic systolic (congestive) heart failure: Secondary | ICD-10-CM

## 2022-02-26 DIAGNOSIS — I4891 Unspecified atrial fibrillation: Secondary | ICD-10-CM | POA: Diagnosis not present

## 2022-02-26 DIAGNOSIS — Z79899 Other long term (current) drug therapy: Secondary | ICD-10-CM | POA: Diagnosis not present

## 2022-02-26 DIAGNOSIS — I4819 Other persistent atrial fibrillation: Secondary | ICD-10-CM | POA: Insufficient documentation

## 2022-02-26 DIAGNOSIS — F17200 Nicotine dependence, unspecified, uncomplicated: Secondary | ICD-10-CM | POA: Diagnosis not present

## 2022-02-26 HISTORY — PX: TEE WITHOUT CARDIOVERSION: SHX5443

## 2022-02-26 LAB — CBC
HCT: 47.2 % — ABNORMAL HIGH (ref 36.0–46.0)
Hemoglobin: 14.8 g/dL (ref 12.0–15.0)
MCH: 28 pg (ref 26.0–34.0)
MCHC: 31.4 g/dL (ref 30.0–36.0)
MCV: 89.2 fL (ref 80.0–100.0)
Platelets: 386 10*3/uL (ref 150–400)
RBC: 5.29 MIL/uL — ABNORMAL HIGH (ref 3.87–5.11)
RDW: 16.1 % — ABNORMAL HIGH (ref 11.5–15.5)
WBC: 7.3 10*3/uL (ref 4.0–10.5)
nRBC: 0 % (ref 0.0–0.2)

## 2022-02-26 SURGERY — ECHOCARDIOGRAM, TRANSESOPHAGEAL
Anesthesia: Monitor Anesthesia Care

## 2022-02-26 SURGERY — RIGHT/LEFT HEART CATH AND CORONARY ANGIOGRAPHY
Anesthesia: LOCAL

## 2022-02-26 MED ORDER — PHENYLEPHRINE 80 MCG/ML (10ML) SYRINGE FOR IV PUSH (FOR BLOOD PRESSURE SUPPORT)
PREFILLED_SYRINGE | INTRAVENOUS | Status: DC | PRN
  Administered 2022-02-26 (×3): 80 ug via INTRAVENOUS

## 2022-02-26 MED ORDER — EPHEDRINE SULFATE-NACL 50-0.9 MG/10ML-% IV SOSY
PREFILLED_SYRINGE | INTRAVENOUS | Status: DC | PRN
  Administered 2022-02-26: 5 mg via INTRAVENOUS
  Administered 2022-02-26: 10 mg via INTRAVENOUS
  Administered 2022-02-26 (×2): 5 mg via INTRAVENOUS

## 2022-02-26 MED ORDER — PROPOFOL 10 MG/ML IV BOLUS
INTRAVENOUS | Status: DC | PRN
  Administered 2022-02-26: 30 mg via INTRAVENOUS

## 2022-02-26 MED ORDER — ETOMIDATE 2 MG/ML IV SOLN
INTRAVENOUS | Status: DC | PRN
  Administered 2022-02-26: 4 mg via INTRAVENOUS

## 2022-02-26 MED ORDER — SODIUM CHLORIDE 0.9 % IV SOLN: INTRAVENOUS | Status: DC

## 2022-02-26 MED ORDER — SODIUM CHLORIDE 0.9% FLUSH: 3.0000 mL | INTRAVENOUS | Status: DC | PRN

## 2022-02-26 MED ORDER — BUTAMBEN-TETRACAINE-BENZOCAINE 2-2-14 % EX AERO
INHALATION_SPRAY | CUTANEOUS | Status: DC | PRN
  Administered 2022-02-26: 1 via TOPICAL

## 2022-02-26 MED ORDER — SODIUM CHLORIDE 0.9% FLUSH: 3.0000 mL | Freq: Two times a day (BID) | INTRAVENOUS | Status: DC

## 2022-02-26 MED ORDER — SODIUM CHLORIDE 0.9 % IV SOLN: 250.0000 mL | INTRAVENOUS | Status: DC | PRN

## 2022-02-26 MED ORDER — GLYCOPYRROLATE 0.2 MG/ML IJ SOLN
INTRAMUSCULAR | Status: DC | PRN
  Administered 2022-02-26 (×2): .1 mg via INTRAVENOUS

## 2022-02-26 MED ORDER — PROPOFOL 500 MG/50ML IV EMUL
INTRAVENOUS | Status: DC | PRN
  Administered 2022-02-26: 75 ug/kg/min via INTRAVENOUS

## 2022-02-26 NOTE — CV Procedure (Signed)
   TRANSESOPHAGEAL ECHOCARDIOGRAM GUIDED DIRECT CURRENT CARDIOVERSION  NAME:  Joanna [name]    MRN: [id number] DOB:  Jul 18, 1960    ADMIT DATE: 02/26/2022  INDICATIONS: Severe Mitral regurgitation HFrEF  PROCEDURE:   Informed consent was obtained prior to the procedure. The risks, benefits and alternatives for the procedure were discussed and the patient comprehended these risks.  Risks include, but are not limited to, cough, sore throat, vomiting, nausea, somnolence, esophageal and stomach trauma or perforation, bleeding, low blood pressure, aspiration, pneumonia, infection, trauma to the teeth and death.    After a procedural time-out, the oropharynx was anesthetized and the patient was sedated by the anesthesia service. The transesophageal probe was inserted in the esophagus and stomach without difficulty and multiple views were obtained. Sedation by anesthesia  COMPLICATIONS:    Complications: No complications Patient tolerated procedure well.  KEY FINDINGS:  LV: Mildly reduced LVEF, 40-45% RV: Normal size and function Mitral valve: Mild central MR.  Tricuspid valve: trivial TR Pulmonic valve: trivial PI LA: mildly dilated RA: normal size.   Full Report to follow.    Fumie Fiallo Advanced Heart Failure 9:34 AM

## 2022-02-26 NOTE — Discharge Instructions (Signed)

## 2022-02-26 NOTE — Telephone Encounter (Signed)
Per [employer] review of 02/26/22 TEE: For patient Joanna [name]: This is Secondary MR  This valve looks suitable for a MitraClip implant. In the Bicaval and SAXB views, the fossa looks reasonable for a transseptal puncture. The LA dimensions are large enough for steering and straddle of the Clip Delivery System. The MR jet is broad based and centrally located. The posterior leaflet measures 1.39 cm in the 131 degree LVOT grasping views. Gradient and MVA were not provided. I'd like to verify that information in the procedure prior to the start of the case before making a clip recommendation.

## 2022-02-26 NOTE — Interval H&P Note (Signed)
History and Physical Interval Note:  02/26/2022 8:30 AM  Joanna [name]  has presented today for surgery, with the diagnosis of [place].  The various methods of treatment have been discussed with the patient and family. After consideration of risks, benefits and other options for treatment, the patient has consented to  Procedure(s): TRANSESOPHAGEAL ECHOCARDIOGRAM (TEE) (N/A) as a surgical intervention.  The patient's history has been reviewed, patient examined, no change in status, stable for surgery.  I have reviewed the patient's chart and labs.  Questions were answered to the patient's satisfaction.     Shonia Skilling

## 2022-02-26 NOTE — Transfer of Care (Signed)
Immediate Anesthesia Transfer of Care Note  Patient: Joanna [name]  Procedure(s) Performed: TRANSESOPHAGEAL ECHOCARDIOGRAM (TEE)  Patient Location: PACU and Endoscopy Unit  Anesthesia Type:MAC  Level of Consciousness: awake and patient cooperative  Airway & Oxygen Therapy: Patient Spontanous Breathing  Post-op Assessment: Report given to RN and Post -op Vital signs reviewed and stable  Post vital signs: Reviewed and stable  Last Vitals:  Vitals Value Taken Time  BP 107/68 02/26/22 0934  Temp 36.3 C 02/26/22 0934  Pulse 71 02/26/22 0934  Resp 18 02/26/22 0934  SpO2 95 % 02/26/22 0934  Vitals shown include unvalidated device data.  Last Pain:  Vitals:   02/26/22 0934  TempSrc: Temporal  PainSc:          Complications: No notable events documented.

## 2022-02-26 NOTE — Progress Notes (Signed)
Echocardiogram Echocardiogram Transesophageal has been performed.  Joanna [name] 02/26/2022, 9:49 AM

## 2022-02-27 ENCOUNTER — Encounter (HOSPITAL_COMMUNITY): Payer: Self-pay | Admitting: Cardiology

## 2022-02-27 NOTE — Anesthesia Postprocedure Evaluation (Signed)
Anesthesia Post Note  Patient: Joanna [name]  Procedure(s) Performed: TRANSESOPHAGEAL ECHOCARDIOGRAM (TEE)     Patient location during evaluation: Endoscopy Anesthesia Type: MAC Level of consciousness: awake and alert Pain management: pain level controlled Vital Signs Assessment: post-procedure vital signs reviewed and stable Respiratory status: spontaneous breathing, nonlabored ventilation, respiratory function stable and patient connected to nasal cannula oxygen Cardiovascular status: blood pressure returned to baseline and stable Postop Assessment: no apparent nausea or vomiting Anesthetic complications: no  No notable events documented.  Last Vitals:  Vitals:   02/26/22 0950 02/26/22 1000  BP: (!) 114/90 109/73  Pulse: 68 (!) 59  Resp: (!) 27 15  Temp:    SpO2: 94% 97%    Last Pain:  Vitals:   02/26/22 1000  TempSrc:   PainSc: 0-No pain                 Chun Sellen L Terease Marcotte

## 2022-03-06 ENCOUNTER — Other Ambulatory Visit: Payer: Self-pay | Admitting: Cardiovascular Disease

## 2022-03-06 ENCOUNTER — Ambulatory Visit: Payer: 59 | Attending: Internal Medicine | Admitting: Internal Medicine

## 2022-03-06 ENCOUNTER — Ambulatory Visit: Payer: Self-pay | Admitting: *Deleted

## 2022-03-06 ENCOUNTER — Encounter: Payer: Self-pay | Admitting: Internal Medicine

## 2022-03-06 VITALS — BP 118/77 | HR 69 | Temp 98.3°F | Ht 71.0 in | Wt 178.0 lb

## 2022-03-06 DIAGNOSIS — M5431 Sciatica, right side: Secondary | ICD-10-CM | POA: Diagnosis not present

## 2022-03-06 DIAGNOSIS — F17210 Nicotine dependence, cigarettes, uncomplicated: Secondary | ICD-10-CM

## 2022-03-06 DIAGNOSIS — Z7689 Persons encountering health services in other specified circumstances: Secondary | ICD-10-CM | POA: Diagnosis not present

## 2022-03-06 DIAGNOSIS — I5042 Chronic combined systolic (congestive) and diastolic (congestive) heart failure: Secondary | ICD-10-CM

## 2022-03-06 DIAGNOSIS — Z2821 Immunization not carried out because of patient refusal: Secondary | ICD-10-CM

## 2022-03-06 DIAGNOSIS — J449 Chronic obstructive pulmonary disease, unspecified: Secondary | ICD-10-CM

## 2022-03-06 DIAGNOSIS — I480 Paroxysmal atrial fibrillation: Secondary | ICD-10-CM | POA: Diagnosis not present

## 2022-03-06 DIAGNOSIS — Z1211 Encounter for screening for malignant neoplasm of colon: Secondary | ICD-10-CM

## 2022-03-06 DIAGNOSIS — F17200 Nicotine dependence, unspecified, uncomplicated: Secondary | ICD-10-CM

## 2022-03-06 DIAGNOSIS — H9193 Unspecified hearing loss, bilateral: Secondary | ICD-10-CM

## 2022-03-06 DIAGNOSIS — E069 Thyroiditis, unspecified: Secondary | ICD-10-CM

## 2022-03-06 DIAGNOSIS — Z1231 Encounter for screening mammogram for malignant neoplasm of breast: Secondary | ICD-10-CM

## 2022-03-06 MED ORDER — NICOTINE 14 MG/24HR TD PT24: 14.0000 mg | MEDICATED_PATCH | Freq: Every day | TRANSDERMAL | 2 refills | Status: DC

## 2022-03-06 NOTE — Progress Notes (Signed)
Patient ID: Joanna [name], female    DOB: May 12, 1960  MRN: [id number]  CC: Establish Care (Re-establishing care. /ER visit on 02/26/22, pt requesting blood work. Rayne Du to flu vax.)   Subjective: Joanna [name] is a 62 y.o. female who presents for re-est care.  Sister Joanna [name] is with her. 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 Cathie Olden and Curt Bears), tob dep, COPD  Pt last saw me 2020.  No PCP since then.    Patient hospitalized 12/20-23/2023 with persistent atrial fibrillation for initiation of Tikosyn loading.  She successfully converted to sinus. Had TEE 02/26/2022. Reports being told heart looked good with TEE and cardiac did not need to be done.  Findings are copied below: LV: Mildly reduced LVEF, 40-45% RV: Normal size and function Mitral valve: Mild central MR.  Tricuspid valve: trivial TR Pulmonic valve: trivial PI LA: mildly dilated RA: normal size.   Atrial fibrillation/HTN/nonischemic cardiomyopathy/combined systolic and diastolic CHF: Reports compliance with meds:  Coreg 6.25 mg BID, Tikosyn 500 mg BID, Eliquis 5 mg BID, Furosemide 40 mg daily, spironolactone 12.5 mg daily, Farxiga 10 mg daily, Entresto 97/103 mg twice a day Apparently was on amiodarone in the past.  Patient states this because significant generalized body rash and also affected her thyroid.  This was discontinued 10/2021. No palpitations, CP, SOB, LE edema.  Pt thought she needed blood test No bruising or bleeding on Eliquis.  H/H stable.  GFR >60  Tob dep:  smoking 5 cig/day Smoked since age 55.  At her heavies she was 1/2 pk a day Quit for 4 mths cold [place] one time.  Restarted because she was around people who smoke.  Trying to quit again Dx with COPD in past but never on inhaler and does not feel the need for 1.  Hyperthyroid:  on Tapazole 5 mg daily.   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  Last TSH level was suppressed.  C/o pain RT lower back.  Started 1 yr ago when she was started on Amiodarone.  Pain is intermittent and sometimes radiates down the side of the right leg.  When it occurs it can last for 2 days.  She takes Tylenol as needed.  She has not had any falls but the leg feels weak when she has a flareup.  No numbness or tingling in the legs.  Sister gave her one of her canes to use today because she was having a little bit of a flareup this morning.  HM:  fhx of colon CA in mom.  Does not want c-scope but agreeable to doing Cologuard test.  Declines flu shot.  Declines shingles vaccine.  Due for mammogram..    Patient Active Problem List   Diagnosis Date Noted   Persistent atrial fibrillation ([place]) 01/16/2022   Hypercoagulable state due to persistent atrial fibrillation ([place]) 01/16/2022   Severe mitral regurgitation 03/50/0938   Chronic systolic heart failure ([place]) 01/15/2022   Amiodarone-induced thyroiditis 12/22/2021   Acute on chronic diastolic CHF (congestive heart failure) ([place]) 12/20/2021   Chronic combined systolic and diastolic CHF (congestive heart failure) ([place]) 09/07/2019   Former smoker 03/17/2018   Essential hypertension 03/17/2018   Atrial fibrillation with RVR ([place]) 03/08/2018   Accelerated hypertension 03/08/2018   Hyperglycemia 18/29/9371   Acute systolic (congestive) heart failure ([place])    Hypertensive urgency 04/11/2015   COPD (chronic obstructive  pulmonary disease) (H[facility]) 04/01/2015     Current Outpatient Medications on File Prior to Visit  Medication Sig Dispense Refill   acetaminophen (TYLENOL) 500 MG tablet Take 500-1,000 mg by mouth every 6 (six) hours as needed (pain.).     carvedilol (COREG) 6.25 MG tablet Take 1 tablet (6.25 mg total) by mouth 2 (two) times daily. 180 tablet 3   dapagliflozin propanediol (FARXIGA) 10 MG TABS tablet Take 1 tablet (10 mg total) by mouth daily. 90 tablet  11   dofetilide (TIKOSYN) 500 MCG capsule Take 1 capsule (500 mcg total) by mouth 2 (two) times daily. 60 capsule 5   ELIQUIS 5 MG TABS tablet TAKE 1 TABLET BY MOUTH TWICE  DAILY 200 tablet 2   ENTRESTO 97-103 MG TAKE 1 TABLET BY MOUTH TWICE  DAILY 200 tablet 2   furosemide (LASIX) 40 MG tablet Take 1 tablet (40 mg total) by mouth daily. 90 tablet 3   methimazole (TAPAZOLE) 5 MG tablet TAKE 1 TABLET(5 MG) BY MOUTH DAILY 90 tablet 1   potassium chloride (KLOR-CON) 10 MEQ tablet Take 1 tablet (10 mEq total) by mouth daily. 90 tablet 3   spironolactone (ALDACTONE) 25 MG tablet Take 0.5 tablets (12.5 mg total) by mouth daily. 45 tablet 3   No current facility-administered medications on file prior to visit.    No Known Allergies  Social History   Socioeconomic History   Marital status: Single    Spouse name: Not on file   Number of children: 1   Years of education: Not on file   Highest education level: High school graduate  Occupational History   Occupation: unemployed    Comment: on disablity  Tobacco Use   Smoking status: Every Day    Packs/day: 2.00    Years: 30.00    Total pack years: 60.00    Types: Cigarettes    Last attempt to quit: 02/08/2018    Years since quitting: 4.0   Smokeless tobacco: Never   Tobacco comments:    3 cigarettes daily 02/09/22  Vaping Use   Vaping Use: Never used  Substance and Sexual Activity   Alcohol use: No    Comment: quit 2 years ago   Drug use: No   Sexual activity: Not on file  Other Topics Concern   Not on file  Social History Narrative   Not on file   Social Determinants of Health   Financial Resource Strain: Low Risk  (12/22/2021)   Overall Financial Resource Strain (CARDIA)    Difficulty of Paying Living Expenses: Not very hard  Food Insecurity: No Food Insecurity (12/26/2021)   Hunger Vital Sign    Worried About Running Out of Food in the Last Year: Never true    Ran Out of Food in the Last Year: Never true  Transportation  Needs: No Transportation Needs (12/26/2021)   PRAPARE - Administrator, Civil Service (Medical): No    Lack of Transportation (Non-Medical): No  Physical Activity: Not on file  Stress: Not on file  Social Connections: Not on file  Intimate Partner Violence: Not At Risk (12/21/2021)   Humiliation, Afraid, Rape, and Kick questionnaire    Fear of Current or Ex-Partner: No    Emotionally Abused: No    Physically Abused: No    Sexually Abused: No    Family History  Problem Relation Age of Onset   Cancer Mother    Heart disease Father     Past Surgical History:  Procedure  Laterality Date   APPENDECTOMY     BACK SURGERY     CARDIOVERSION N/A 03/14/2018   Procedure: CARDIOVERSION;  Surgeon: Fay Records, MD;  Location: [facility];  Service: Cardiovascular;  Laterality: N/A;   TEE WITHOUT CARDIOVERSION N/A 03/14/2018   Procedure: TRANSESOPHAGEAL ECHOCARDIOGRAM (TEE);  Surgeon: Fay Records, MD;  Location: [facility];  Service: Cardiovascular;  Laterality: N/A;   TEE WITHOUT CARDIOVERSION N/A 02/26/2022   Procedure: TRANSESOPHAGEAL ECHOCARDIOGRAM (TEE);  Surgeon: Hebert Soho, DO;  [facility];  Service: Cardiovascular;  Laterality: N/A;    ROS: Review of Systems Negative except as stated above  PHYSICAL EXAM: BP 118/77 (BP Location: Left Arm, Patient Position: Sitting, Cuff Size: Normal)   Pulse 69   Temp 98.3 F (36.8 C) (Oral)   Ht 5\' 11"  (1.803 m)   Wt 178 lb (80.7 kg)   LMP 06/10/2011   SpO2 100%   BMI 24.83 kg/m   Wt Readings from Last 3 Encounters:  03/06/22 178 lb (80.7 kg)  02/26/22 179 lb (81.2 kg)  02/12/22 181 lb 12.8 oz (82.5 kg)    Physical Exam  General appearance - alert, well appearing, and in no distress.  Patient with decreased hearing so I had to talk above conversational voice Mental status - normal mood, behavior, speech, dress, motor activity, and thought processes Neck - supple, no significant adenopathy Ears: Both  ear canal and tympanic membrane within normal limits. Chest - clear to auscultation, no wheezes, rales or rhonchi, symmetric air entry Heart -sounds to be in sinus today.  Normal rate, regular rhythm, normal S1, S2, no murmurs, rubs, clicks or gallops Musculoskeletal -patient ambulates with a cane.  Gait is slow.  Seems to favor the right leg.  Power in both lower extremities 5/5 bilaterally.  Straight leg raise negative.  No tenderness on palpation of the lumbar spine Extremities - peripheral pulses normal, no pedal edema, no clubbing or cyanosis Skin: Healed excoriated areas noted over upper and lower extremities.  Patient attributes this to widespread rash that she had several months ago which she attributed to amiodarone.     Latest Ref Rng & Units 02/09/2022   10:55 AM 01/27/2022    1:43 AM 01/26/2022    2:07 AM  CMP  Glucose 70 - 99 mg/dL 88  106  104   BUN 8 - 23 mg/dL 24  15  16    Creatinine 0.44 - 1.00 mg/dL 0.99  0.97  1.02   Sodium 135 - 145 mmol/L 141  142  140   Potassium 3.5 - 5.1 mmol/L 3.7  4.4  4.1   Chloride 98 - 111 mmol/L 110  107  105   CO2 22 - 32 mmol/L 16  28  26    Calcium 8.9 - 10.3 mg/dL 9.3  9.1  9.2    Lipid Panel     Component Value Date/Time   CHOL 176 08/03/2020 0814   TRIG 145 08/03/2020 0814   HDL 41 08/03/2020 0814   CHOLHDL 4.3 08/03/2020 0814   CHOLHDL 3.6 03/09/2018 0407   VLDL 13 03/09/2018 0407   LDLCALC 109 (H) 08/03/2020 0814    CBC    Component Value Date/Time   WBC 7.3 02/26/2022 0759   RBC 5.29 (H) 02/26/2022 0759   HGB 14.8 02/26/2022 0759   HGB 14.4 03/03/2021 0954   HCT 47.2 (H) 02/26/2022 0759   HCT 44.3 03/03/2021 0954   PLT 386 02/26/2022 0759   PLT 378 03/03/2021 0954  MCV 89.2 02/26/2022 0759   MCV 87 03/03/2021 0954   MCH 28.0 02/26/2022 0759   MCHC 31.4 02/26/2022 0759   RDW 16.1 (H) 02/26/2022 0759   RDW 11.3 (L) 03/03/2021 0954   LYMPHSABS 2.0 12/21/2021 0140   MONOABS 0.5 12/21/2021 0140   EOSABS 0.1  12/21/2021 0140   BASOSABS 0.0 12/21/2021 0140    ASSESSMENT AND PLAN: 1. Establishing care with new doctor, encounter for   2. Chronic combined systolic and diastolic CHF (congestive heart failure) ([place]) Compensated. Continue guideline directed therapy including spironolactone 12.5 mg daily Entresto 97/103 mg twice a day Farxiga 10 mg daily Carvedilol 6.25 mg twice a day Furosemide 40 mg daily 3. Paroxysmal atrial fibrillation (HCC) On Eliquis without reports of bruising or bleeding.  Also on Tikosyn.  Followed by cardiology  4. Tobacco dependence Pt is current smoker. Patient advised to quit smoking. Discussed health risks associated with smoking including lung and other types of cancers, chronic lung diseases and CV risks.. Pt ready to give trail of quitting.   Discussed methods to help quit including quitting cold [place], use of NRT, Chantix and Bupropion.  Pt wanting to try: Nicotine patches.  We discussed the stepdown approach.  Given the amount that she currently smokes, we will start with the 14 mg patches.  I went over with her how to use it. _3_ Minutes spent on counseling. F/U: Assess progress on subsequent visit   5. Decreased hearing of both ears Refer to ENT for evaluation  6. Sciatica of right side Advised to avoid heavy lifting, pushing or pulling.  Okay to continue to use Tylenol as needed.  7. Chronic obstructive pulmonary disease, unspecified COPD type ([place]) Asymptomatic and without need of inhaler at this time  8. Hyperthyroiditis Caused by amiodarone which was discontinued.  She is currently on low-dose Tapazole. - TSH+T4F+T3Free  9. Screening for colon cancer - Cologuard  10. Influenza vaccination declined   11. Encounter for screening mammogram for malignant neoplasm of breast - MM Digital Screening; Future   Patient was given the opportunity to ask questions.  Patient verbalized understanding of the plan and was able to repeat key elements  of the plan.   This documentation was completed using Radio producer.  Any transcriptional errors are unintentional.  No orders of the defined types were placed in this encounter.    Requested Prescriptions    No prescriptions requested or ordered in this encounter    No follow-ups on file.  Karle Plumber, MD, FACP

## 2022-03-06 NOTE — Telephone Encounter (Signed)
  Chief Complaint: Leg pain Symptoms: Upper right leg, side, below hip. Also with runny nose ,sneezing this AM, voice hoarse, fatigued Frequency: "Months" Pertinent Negatives: Patient denies swelling, redness, warmth of leg, no cough. Disposition: [] ED /[] Urgent Care (no appt availability in office) / [x] Appointment(In office/virtual)/ []  [facility]/ [] Home Care/ [] Refused Recommended Disposition /[] [facility]/ []  Follow-up with PCP Additional Notes: Pt evasive historian. States "Heart med I was on caused leg pain, they put me on another and my heart is good now, but still have the leg pain."  Pt has appt secured for this afternoon. Was initially calling to cancel "So tired of running to appts." Advised to keep appt, go to ED for worsening symptoms. Reiterated need to keep appt today. Unsure pt will do so. Reason for Disposition  [1] SEVERE pain (e.g., excruciating, unable to do any normal activities) AND [2] not improved after 2 hours of pain medicine  Answer Assessment - Initial Assessment Questions 1. ONSET: "When did the pain start?"      Months 2. LOCATION: "Where is the pain located?"      Side of right leg 3. PAIN: "How bad is the pain?"    (Scale 1-10; or mild, moderate, severe)   -  MILD (1-3): doesn't interfere with normal activities    -  MODERATE (4-7): interferes with normal activities (e.g., work or school) or awakens from sleep, limping    -  SEVERE (8-10): excruciating pain, unable to do any normal activities, unable to walk     10/10 at times. 8/10 presently 4. WORK OR EXERCISE: "Has there been any recent work or exercise that involved this part of the body?"      No 5. CAUSE: "What do you think is causing the leg pain?"     "Heart medication" Took me off of it in November 6. OTHER SYMPTOMS: "Do you have any other symptoms?" (e.g., chest pain, back pain, breathing difficulty, swelling, rash, fever, numbness, weakness)     Also has runny nose, hoarse  voice, sneezing. Onset this AM  Protocols used: Leg Pain-A-AH

## 2022-03-07 ENCOUNTER — Other Ambulatory Visit: Payer: Self-pay | Admitting: Cardiovascular Disease

## 2022-03-07 LAB — TSH+T4F+T3FREE
Free T4: 0.95 ng/dL (ref 0.82–1.77)
T3, Free: 2.3 pg/mL (ref 2.0–4.4)
TSH: 3.51 u[IU]/mL (ref 0.450–4.500)

## 2022-03-09 ENCOUNTER — Ambulatory Visit: Payer: Medicare Other | Admitting: Physician Assistant

## 2022-03-12 LAB — ECHO TEE

## 2022-03-19 ENCOUNTER — Telehealth: Payer: Self-pay

## 2022-03-19 ENCOUNTER — Ambulatory Visit: Payer: Self-pay | Admitting: Internal Medicine

## 2022-03-19 NOTE — Progress Notes (Signed)
Patient ID: Joanna [name] MRN: [id number] DOB/AGE: [birth year] 63 y.o.  Primary Care Physician:Johnson, Dalbert Batman, MD Primary Cardiologist: Hebert Soho  PATIENT DID NOT APPEAR FOR APPOINTMENT   FOCUSED CARDIOVASCULAR PROBLEM LIST:   1.  Nonischemic cardiomyopathy with ejection fraction of 35 to 40% on TEE 2024 2.  Mild to moderate mitral regurgitation on TEE 2024 3.  Persistent atrial fibrillation on Eliquis and Tikosyn  HISTORY OF PRESENT ILLNESS: The patient is a 62 y.o. female with the indicated medical history here for recommendations regarding her mitral regurgitation.  The patient was admitted in November due to decompensated heart failure thought to be due to atrial fibrillation from amiodarone induced thyrotoxicosis.  She had an echocardiogram that I reviewed which demonstrated severe eccentric mitral regurgitation.  She was discharged and was started on goal-directed medical therapy.  She saw advanced heart failure in January and endorses NYHA class II symptoms.  It was noted that she was in normal sinus rhythm during that appointment.  Her Coreg was increased to 6.25 mg twice a day.  She was referred for a TEE.  Past Medical History:  Diagnosis Date   Atrial fibrillation with RVR (HCC)    Chronic combined systolic (congestive) and diastolic (congestive) heart failure (HCC)    Dyspnea    Goiter    Hypertension    NICM (nonischemic cardiomyopathy) ([place])    a. 08/2005 Echo: EF 30-35%, mod diff HK. Mild to Mod MR. Mildly dil LA; b. 08/2005 Cath: Nl Cors. Elevated CO w/o shunt; c. 09/2007 Echo: EF 45%. Mild to mod MR; c. 03/2015 Echo: EF 45%, global HK. Gr1 DD. Mild MR. Mildly dil LA.    Past Surgical History:  Procedure Laterality Date   APPENDECTOMY     BACK SURGERY     CARDIOVERSION N/A 03/14/2018   Procedure: CARDIOVERSION;  Surgeon: Fay Records, MD;  Location: [facility];  Service: Cardiovascular;  Laterality: N/A;   TEE WITHOUT CARDIOVERSION N/A 03/14/2018    Procedure: TRANSESOPHAGEAL ECHOCARDIOGRAM (TEE);  Surgeon: Fay Records, MD;  Location: [facility];  Service: Cardiovascular;  Laterality: N/A;   TEE WITHOUT CARDIOVERSION N/A 02/26/2022   Procedure: TRANSESOPHAGEAL ECHOCARDIOGRAM (TEE);  Surgeon: Hebert Soho, DO;  [facility];  Service: Cardiovascular;  Laterality: N/A;    Family History  Problem Relation Age of Onset   Cancer Mother    Heart disease Father     Social History   Socioeconomic History   Marital status: Single    Spouse name: Not on file   Number of children: 1   Years of education: Not on file   Highest education level: High school graduate  Occupational History   Occupation: unemployed    Comment: on disablity  Tobacco Use   Smoking status: Every Day    Packs/day: 2.00    Years: 30.00    Total pack years: 60.00    Types: Cigarettes    Last attempt to quit: 02/08/2018    Years since quitting: 4.1   Smokeless tobacco: Never   Tobacco comments:    3 cigarettes daily 02/09/22  Vaping Use   Vaping Use: Never used  Substance and Sexual Activity   Alcohol use: No    Comment: quit 2 years ago   Drug use: No   Sexual activity: Not on file  Other Topics Concern   Not on file  Social History Narrative   Not on file   Social Determinants of Health   Financial Resource Strain: Low Risk  (  12/22/2021)   Overall Financial Resource Strain (CARDIA)    Difficulty of Paying Living Expenses: Not very hard  Food Insecurity: No Food Insecurity (12/26/2021)   Hunger Vital Sign    Worried About Running Out of Food in the Last Year: Never true    Ran Out of Food in the Last Year: Never true  Transportation Needs: No Transportation Needs (12/26/2021)   PRAPARE - Hydrologist (Medical): No    Lack of Transportation (Non-Medical): No  Physical Activity: Not on file  Stress: Not on file  Social Connections: Not on file  Intimate Partner Violence: Not At Risk (12/21/2021)    Humiliation, Afraid, Rape, and Kick questionnaire    Fear of Current or Ex-Partner: No    Emotionally Abused: No    Physically Abused: No    Sexually Abused: No     Prior to Admission medications   Medication Sig Start Date End Date Taking? Authorizing Provider  acetaminophen (TYLENOL) 500 MG tablet Take 500-1,000 mg by mouth every 6 (six) hours as needed (pain.).    [provider]  carvedilol (COREG) 6.25 MG tablet Take 1 tablet (6.25 mg total) by mouth 2 (two) times daily. 02/12/22   Sabharwal, Aditya, DO  dapagliflozin propanediol (FARXIGA) 10 MG TABS tablet Take 1 tablet (10 mg total) by mouth daily. 01/15/22   Sabharwal, Aditya, DO  dofetilide (TIKOSYN) 500 MCG capsule Take 1 capsule (500 mcg total) by mouth 2 (two) times daily. 01/27/22   Darreld Mclean, PA-C  ELIQUIS 5 MG TABS tablet TAKE 1 TABLET BY MOUTH TWICE  DAILY 10/02/21   Nahser, Wonda Cheng, MD  ENTRESTO 97-103 MG TAKE 1 TABLET BY MOUTH TWICE  DAILY 01/03/22   Nahser, Wonda Cheng, MD  furosemide (LASIX) 40 MG tablet Take 1 tablet (40 mg total) by mouth daily. 01/15/22   Sabharwal, Aditya, DO  methimazole (TAPAZOLE) 5 MG tablet TAKE 1 TABLET(5 MG) BY MOUTH DAILY 01/10/22   Nahser, Wonda Cheng, MD  nicotine (NICODERM CQ - DOSED IN MG/24 HOURS) 14 mg/24hr patch Place 1 patch (14 mg total) onto the skin daily. 03/06/22   Ladell Pier, MD  potassium chloride (KLOR-CON) 10 MEQ tablet Take 1 tablet (10 mEq total) by mouth daily. 02/13/22 05/14/22  Fenton, Clint R, PA  spironolactone (ALDACTONE) 25 MG tablet Take 0.5 tablets (12.5 mg total) by mouth daily. 01/10/22   Lyda Jester M, PA-C    No Known Allergies  REVIEW OF SYSTEMS:  General: no fevers/chills/night sweats Eyes: no blurry vision, diplopia, or amaurosis ENT: no sore throat or hearing loss Resp: no cough, wheezing, or hemoptysis CV: no edema or palpitations GI: no abdominal pain, nausea, vomiting, diarrhea, or constipation GU: no dysuria, frequency, or  hematuria Skin: no rash Neuro: no headache, numbness, tingling, or weakness of extremities Musculoskeletal: no joint pain or swelling Heme: no bleeding, DVT, or easy bruising Endo: no polydipsia or polyuria  LMP 06/10/2011   PHYSICAL EXAM:     DATA AND STUDIES:  EKG: January 2024 normal sinus rhythm with nonspecific ST and T wave changes  2D ECHO: November 2023  1. No significant change from echo done in 2020.   2. Left ventricular ejection fraction, by estimation, is 25 to 30%. The  left ventricle has severely decreased function. The left ventricle  demonstrates global hypokinesis. The left ventricular internal cavity size  was moderately to severely dilated.  There is mild left ventricular hypertrophy. Left ventricular diastolic  parameters are  indeterminate.   3. Right ventricular systolic function is low normal. The right  ventricular size is normal. There is moderately elevated pulmonary artery  systolic pressure.   4. Left atrial size was severely dilated.   5. MR is eccentric, posteriorly directed into LA . The mitral valve is  abnormal. Severe mitral valve regurgitation.   6. Tricuspid valve regurgitation is mild to moderate.   7. The aortic valve is tricuspid. Aortic valve regurgitation is trivial.  Aortic valve sclerosis is present, with no evidence of aortic valve  stenosis.   8. The inferior vena cava is dilated in size with <50% respiratory  variability, suggesting right atrial pressure of 15 mmHg.   TEE:  January 2024  1. Left ventricular ejection fraction, by estimation, is 35 to 40%. The  left ventricle has mild to moderately decreased function. The left  ventricle has no regional wall motion abnormalities. Left ventricular  diastolic function could not be evaluated.   2. Right ventricular systolic function is normal. The right ventricular  size is normal.   3. Left atrial size was mild to moderately dilated. No left atrial/left  atrial appendage  thrombus was detected.   4. The mitral valve is normal in structure. Mild to moderate mitral valve  regurgitation. No evidence of mitral stenosis.   5. The aortic valve is normal in structure. Aortic valve regurgitation is  not visualized. No aortic stenosis is present.   6. The inferior vena cava is normal in size with greater than 50%  respiratory variability, suggesting right atrial pressure of 3 mmHg.   CARDIAC CATH: Per report done in 2007 with no obstructive coronary artery disease  STS RISK CALCULATOR: pending  NHYA CLASS: NYHA II    ASSESSMENT AND PLAN:   Mitral valve insufficiency, unspecified etiology  Chronic systolic heart failure ([place])  Persistent atrial fibrillation ([place])     Early Osmond, MD  04/18/2022 2:22 PM    [address], [place], [place]  [address] Phone: [phone]; Fax: [phone]

## 2022-03-19 NOTE — Telephone Encounter (Signed)
The patient was scheduled for MR consult with Dr. Ali Lowe today and cancelled because she did not feel well. Spoke with Dr. Ali Lowe, who reviewed her TEE and said her MR is mild-moderate and rescheduled appointment is not needed at this time.  Called and discussed with the patient - she understands to continue following with her primary cardiologist and they will let Dr. Ali Lowe know if her leaky VM worsens.   She was grateful for call and agreed with plan.

## 2022-03-19 NOTE — Patient Instructions (Signed)
Lab encounter

## 2022-03-19 NOTE — Progress Notes (Signed)
No show

## 2022-03-24 ENCOUNTER — Other Ambulatory Visit: Payer: Self-pay | Admitting: Cardiovascular Disease

## 2022-03-28 DIAGNOSIS — I480 Paroxysmal atrial fibrillation: Secondary | ICD-10-CM | POA: Diagnosis not present

## 2022-03-28 DIAGNOSIS — I5033 Acute on chronic diastolic (congestive) heart failure: Secondary | ICD-10-CM | POA: Diagnosis not present

## 2022-03-28 DIAGNOSIS — E064 Drug-induced thyroiditis: Secondary | ICD-10-CM | POA: Diagnosis not present

## 2022-03-28 DIAGNOSIS — J449 Chronic obstructive pulmonary disease, unspecified: Secondary | ICD-10-CM | POA: Diagnosis not present

## 2022-04-02 ENCOUNTER — Ambulatory Visit: Payer: Self-pay | Admitting: Physician Assistant

## 2022-04-02 NOTE — Progress Notes (Deleted)
Cardiology Office Note Date:  04/02/2022  Patient ID:  Joanna, [name] 1960-10-27, MRN [id number] PCP:  Ladell Pier, MD  Cardiologist:  Dr. Acie Fredrickson Electrophysiologist: Dr. Curt Bears  ***refresh   Chief Complaint: *** 1 mo  History of Present Illness: Joanna [name] is a 62 y.o. female with history of HTN, NICM, chronic CHF (systolic), VHD (severe MR, functional) AFib.  She saw HF team 02/12/22, maintaining SR, planned for TEE/LHC to start eval for possible MV intervention/TEER  TEE LVEF 40-45%mild MR (No cath) No longer felt need for structural heart tea,/MV intervention  *** tiksoyn EKG, labs *** burden *** symptoms, volume   AFib/AAD hx Diagnosed 2020 Hx of amiodarone started 2020 stopped 2/2 thyrotoxicosis Sept 2023 Tikosyn started Dec 2023 Past Medical History:  Diagnosis Date   Atrial fibrillation with RVR ([place])    Chronic combined systolic (congestive) and diastolic (congestive) heart failure (HCC)    Dyspnea    Goiter    Hypertension    NICM (nonischemic cardiomyopathy) ([place])    a. 08/2005 Echo: EF 30-35%, mod diff HK. Mild to Mod MR. Mildly dil LA; b. 08/2005 Cath: Nl Cors. Elevated CO w/o shunt; c. 09/2007 Echo: EF 45%. Mild to mod MR; c. 03/2015 Echo: EF 45%, global HK. Gr1 DD. Mild MR. Mildly dil LA.    Past Surgical History:  Procedure Laterality Date   APPENDECTOMY     BACK SURGERY     CARDIOVERSION N/A 03/14/2018   Procedure: CARDIOVERSION;  Surgeon: Fay Records, MD;  Location: [place];  Service: Cardiovascular;  Laterality: N/A;   TEE WITHOUT CARDIOVERSION N/A 03/14/2018   Procedure: TRANSESOPHAGEAL ECHOCARDIOGRAM (TEE);  Surgeon: Fay Records, MD;  Location: [place];  Service: Cardiovascular;  Laterality: N/A;   TEE WITHOUT CARDIOVERSION N/A 02/26/2022   Procedure: TRANSESOPHAGEAL ECHOCARDIOGRAM (TEE);  Surgeon: Hebert Soho, DO;  Location: [facility];  Service: Cardiovascular;  Laterality: N/A;    Current Outpatient  Medications  Medication Sig Dispense Refill   acetaminophen (TYLENOL) 500 MG tablet Take 500-1,000 mg by mouth every 6 (six) hours as needed (pain.).     carvedilol (COREG) 6.25 MG tablet Take 1 tablet (6.25 mg total) by mouth 2 (two) times daily. 180 tablet 3   dapagliflozin propanediol (FARXIGA) 10 MG TABS tablet Take 1 tablet (10 mg total) by mouth daily. 90 tablet 11   dofetilide (TIKOSYN) 500 MCG capsule Take 1 capsule (500 mcg total) by mouth 2 (two) times daily. 60 capsule 5   ELIQUIS 5 MG TABS tablet TAKE 1 TABLET BY MOUTH TWICE  DAILY 200 tablet 2   ENTRESTO 97-103 MG TAKE 1 TABLET BY MOUTH TWICE  DAILY 200 tablet 2   furosemide (LASIX) 40 MG tablet Take 1 tablet (40 mg total) by mouth daily. 90 tablet 3   methimazole (TAPAZOLE) 5 MG tablet TAKE 1 TABLET(5 MG) BY MOUTH DAILY 90 tablet 1   nicotine (NICODERM CQ - DOSED IN MG/24 HOURS) 14 mg/24hr patch Place 1 patch (14 mg total) onto the skin daily. 28 patch 2   potassium chloride (KLOR-CON) 10 MEQ tablet Take 1 tablet (10 mEq total) by mouth daily. 90 tablet 3   spironolactone (ALDACTONE) 25 MG tablet Take 0.5 tablets (12.5 mg total) by mouth daily. 45 tablet 3   No current facility-administered medications for this visit.    Allergies:   Patient has no known allergies.   Social History:  The patient  reports that she has been smoking cigarettes. She has a  60.00 pack-year smoking history. She has never used smokeless tobacco. She reports that she does not drink alcohol and does not use drugs.   Family History:  The patient's family history includes Cancer in her mother; Heart disease in her father.  ROS:  Please see the history of present illness.    All other systems are reviewed and otherwise negative.   PHYSICAL EXAM:  VS:  LMP 06/10/2011  BMI: There is no height or weight on file to calculate BMI. Well nourished, well developed, in no acute distress HEENT: normocephalic, atraumatic Neck: no JVD, carotid bruits or  masses Cardiac:  *** RRR; no significant murmurs, no rubs, or gallops Lungs:  *** CTA b/l, no wheezing, rhonchi or rales Abd: soft, nontender MS: no deformity or *** atrophy Ext: *** no edema Skin: warm and dry, no rash Neuro:  No gross deficits appreciated Psych: euthymic mood, full affect   EKG:  Done today and reviewed by myself shows  ***  02/26/22: TEE LV: Mildly reduced LVEF, 40-45% RV: Normal size and function Mitral valve: Mild central MR.  Tricuspid valve: trivial TR Pulmonic valve: trivial PI LA: mildly dilated RA: normal size.   12/21/21: TTE 1. No significant change from echo done in 2020.   2. Left ventricular ejection fraction, by estimation, is 25 to 30%. The  left ventricle has severely decreased function. The left ventricle  demonstrates global hypokinesis. The left ventricular internal cavity size  was moderately to severely dilated.  There is mild left ventricular hypertrophy. Left ventricular diastolic  parameters are indeterminate.   3. Right ventricular systolic function is low normal. The right  ventricular size is normal. There is moderately elevated pulmonary artery  systolic pressure.   4. Left atrial size was severely dilated.   5. MR is eccentric, posteriorly directed into LA . The mitral valve is  abnormal. Severe mitral valve regurgitation.   6. Tricuspid valve regurgitation is mild to moderate.   7. The aortic valve is tricuspid. Aortic valve regurgitation is trivial.  Aortic valve sclerosis is present, with no evidence of aortic valve  stenosis.   8. The inferior vena cava is dilated in size with <50% respiratory  variability, suggesting right atrial pressure of 15 mmHg.    10/04/2020: TTE 1. Left ventricular ejection fraction, by estimation, is 55 to 60%. The  left ventricle has normal function. The left ventricle has no regional  wall motion abnormalities. There is moderate left ventricular hypertrophy.  Left ventricular diastolic   parameters are indeterminate.   2. Right ventricular systolic function is normal. The right ventricular  size is normal.   3. Left atrial size was severely dilated.   4. The mitral valve is grossly normal. Moderate mitral valve  regurgitation.   5. The aortic valve is normal in structure. Aortic valve regurgitation is  not visualized. No aortic stenosis is present.    Recent Labs: 12/21/2021: ALT 46 01/15/2022: B Natriuretic Peptide 113.2 02/09/2022: BUN 24; Creatinine, Ser 0.99; Magnesium 2.1; Potassium 3.7; Sodium 141 02/26/2022: Hemoglobin 14.8; Platelets 386 03/06/2022: TSH 3.510  No results found for requested labs within last 365 days.   CrCl cannot be calculated (Patient's most recent lab result is older than the maximum 21 days allowed.).   Wt Readings from Last 3 Encounters:  03/06/22 178 lb (80.7 kg)  02/26/22 179 lb (81.2 kg)  02/12/22 181 lb 12.8 oz (82.5 kg)     Other studies reviewed: Additional studies/records reviewed today include: summarized above  ASSESSMENT  AND PLAN:  Persistent AFib CHA2DS2Vasc is 3, on eliquis, *** appropriately dosed Tikosyn ***  NICM Suspect tachy-mediated Chronic CHF MR Felt to be functional LVEF and MR are improved with SR ***  HTN ***  Disposition: F/u with ***  Current medicines are reviewed at length with the patient today.  The patient did not have any concerns regarding medicines.  Venetia Night, PA-C 04/02/2022 7:14 AM     [facility] [address] [place] [address] [phone] (office)  [phone] (fax)

## 2022-04-15 NOTE — Progress Notes (Unsigned)
Cardiology Office Note Date:  04/17/2022  Patient ID:  Joanna [name], Joanna [name] [birth year], MRN [id number] PCP:  Ladell Pier, MD  Cardiologist:  Dr. Acie Fredrickson Electrophysiologist: Dr. Curt Bears     Chief Complaint: overdue for her 1 mo tikosyn  History of Present Illness: Joanna [name] is a 62 y.o. female with history of HTN, NICM, chronic CHF (systolic), VHD (severe MR, functional) AFib.  Admitted for Tikosyn initiation Dec 2023, during which she converted with drug, did have a 4.2second post termination pause and her dig stopped, coreg reduced. She saw the Afib clinic as usual in f/u, maintaining SR  Missed/cancelled her 1 mo f/u  She saw HF team 02/12/22, maintaining SR, planned for TEE/LHC to start eval for possible MV intervention/TEER  TEE LVEF 40-45%mild MR (No cath) No longer felt need for structural heart tea,/MV intervention  TODAY Cardiac-wise she has no complaints or concerns, says her heart has been "great". She denies any CP, palpitations or cardiac awareness No SOB No near syncope or syncope  She is in a lot of pain with R wrist and L hip/buttock pain, now for weeks, saw her PMD a bit ago without much help she reports. She has had intermittent flare ups of this pain over the years. I have advised her to reach out to her PMD to re-evaluate and manage this  She reports excellent medication compliance.   AFib/AAD hx Diagnosed 2020 Hx of amiodarone started 2020 stopped 2/2 thyrotoxicosis Sept 2023 Tikosyn started Dec 2023 Past Medical History:  Diagnosis Date   Atrial fibrillation with RVR (HCC)    Chronic combined systolic (congestive) and diastolic (congestive) heart failure (HCC)    Dyspnea    Goiter    Hypertension    NICM (nonischemic cardiomyopathy) ([place])    a. 08/2005 Echo: EF 30-35%, mod diff HK. Mild to Mod MR. Mildly dil LA; b. 08/2005 Cath: Nl Cors. Elevated CO w/o shunt; c. 09/2007 Echo: EF 45%. Mild to mod MR; c. 03/2015 Echo: EF 45%, global HK.  Gr1 DD. Mild MR. Mildly dil LA.    Past Surgical History:  Procedure Laterality Date   APPENDECTOMY     BACK SURGERY     CARDIOVERSION N/A 03/14/2018   Procedure: CARDIOVERSION;  Surgeon: Fay Records, MD;  Location: [place];  Service: Cardiovascular;  Laterality: N/A;   TEE WITHOUT CARDIOVERSION N/A 03/14/2018   Procedure: TRANSESOPHAGEAL ECHOCARDIOGRAM (TEE);  Surgeon: Fay Records, MD;  Location: [place];  Service: Cardiovascular;  Laterality: N/A;   TEE WITHOUT CARDIOVERSION N/A 02/26/2022   Procedure: TRANSESOPHAGEAL ECHOCARDIOGRAM (TEE);  Surgeon: Hebert Soho, DO;  Location: [facility];  Service: Cardiovascular;  Laterality: N/A;    Current Outpatient Medications  Medication Sig Dispense Refill   acetaminophen (TYLENOL) 500 MG tablet Take 500-1,000 mg by mouth every 6 (six) hours as needed (pain.).     carvedilol (COREG) 3.125 MG tablet Take 3.125 mg by mouth 2 (two) times daily with a meal.     dapagliflozin propanediol (FARXIGA) 10 MG TABS tablet Take 1 tablet (10 mg total) by mouth daily. 90 tablet 11   dofetilide (TIKOSYN) 500 MCG capsule Take 1 capsule (500 mcg total) by mouth 2 (two) times daily. 60 capsule 5   ELIQUIS 5 MG TABS tablet TAKE 1 TABLET BY MOUTH TWICE  DAILY 200 tablet 2   ENTRESTO 97-103 MG TAKE 1 TABLET BY MOUTH TWICE  DAILY 200 tablet 2   furosemide (LASIX) 40 MG tablet Take 1 tablet (40 mg  total) by mouth daily. 90 tablet 3   methimazole (TAPAZOLE) 5 MG tablet TAKE 1 TABLET(5 MG) BY MOUTH DAILY 90 tablet 1   nicotine (NICODERM CQ - DOSED IN MG/24 HOURS) 14 mg/24hr patch Place 1 patch (14 mg total) onto the skin daily. 28 patch 2   potassium chloride (KLOR-CON) 10 MEQ tablet Take 1 tablet (10 mEq total) by mouth daily. 90 tablet 3   spironolactone (ALDACTONE) 25 MG tablet Take 0.5 tablets (12.5 mg total) by mouth daily. 45 tablet 3   No current facility-administered medications for this visit.    Allergies:   Patient has no known allergies.    Social History:  The patient  reports that she has been smoking cigarettes. She has a 60.00 pack-year smoking history. She has never used smokeless tobacco. She reports that she does not drink alcohol and does not use drugs.   Family History:  The patient's family history includes Cancer in her mother; Heart disease in her father.  ROS:  Please see the history of present illness.    All other systems are reviewed and otherwise negative.   PHYSICAL EXAM:  VS:  BP 130/80   Pulse 89   Ht 6' (1.829 m)   Wt 172 lb (78 kg)   LMP 06/10/2011   SpO2 99%   BMI 23.33 kg/m  BMI: Body mass index is 23.33 kg/m. Well nourished, well developed, in no acute distress HEENT: normocephalic, atraumatic Neck: no JVD, carotid bruits or masses Cardiac:  RRR; no significant murmurs, no rubs, or gallops Lungs:  CTA b/l, no wheezing, rhonchi or rales Abd: soft, nontender MS: no deformity or atrophy Ext: no edema Skin: warm and dry, no rash Neuro:  No gross deficits appreciated Psych: euthymic mood, full affect   EKG:  Done today and reviewed by myself shows  SR 89bpm, QTc 42m  02/26/22: TEE LV: Mildly reduced LVEF, 40-45% RV: Normal size and function Mitral valve: Mild central MR.  Tricuspid valve: trivial TR Pulmonic valve: trivial PI LA: mildly dilated RA: normal size.   12/21/21: TTE 1. No significant change from echo done in 2020.   2. Left ventricular ejection fraction, by estimation, is 25 to 30%. The  left ventricle has severely decreased function. The left ventricle  demonstrates global hypokinesis. The left ventricular internal cavity size  was moderately to severely dilated.  There is mild left ventricular hypertrophy. Left ventricular diastolic  parameters are indeterminate.   3. Right ventricular systolic function is low normal. The right  ventricular size is normal. There is moderately elevated pulmonary artery  systolic pressure.   4. Left atrial size was severely  dilated.   5. MR is eccentric, posteriorly directed into LA . The mitral valve is  abnormal. Severe mitral valve regurgitation.   6. Tricuspid valve regurgitation is mild to moderate.   7. The aortic valve is tricuspid. Aortic valve regurgitation is trivial.  Aortic valve sclerosis is present, with no evidence of aortic valve  stenosis.   8. The inferior vena cava is dilated in size with <50% respiratory  variability, suggesting right atrial pressure of 15 mmHg.    10/04/2020: TTE 1. Left ventricular ejection fraction, by estimation, is 55 to 60%. The  left ventricle has normal function. The left ventricle has no regional  wall motion abnormalities. There is moderate left ventricular hypertrophy.  Left ventricular diastolic  parameters are indeterminate.   2. Right ventricular systolic function is normal. The right ventricular  size is normal.  3. Left atrial size was severely dilated.   4. The mitral valve is grossly normal. Moderate mitral valve  regurgitation.   5. The aortic valve is normal in structure. Aortic valve regurgitation is  not visualized. No aortic stenosis is present.    Recent Labs: 12/21/2021: ALT 46 01/15/2022: B Natriuretic Peptide 113.2 02/09/2022: BUN 24; Creatinine, Ser 0.99; Magnesium 2.1; Potassium 3.7; Sodium 141 02/26/2022: Hemoglobin 14.8; Platelets 386 03/06/2022: TSH 3.510  No results found for requested labs within last 365 days.   CrCl cannot be calculated (Patient's most recent lab result is older than the maximum 21 days allowed.).   Wt Readings from Last 3 Encounters:  04/17/22 172 lb (78 kg)  03/06/22 178 lb (80.7 kg)  02/26/22 179 lb (81.2 kg)     Other studies reviewed: Additional studies/records reviewed today include: summarized above  ASSESSMENT AND PLAN:  Persistent AFib CHA2DS2Vasc is 3, on eliquis, appropriately dosed Tikosyn, with stable QTc Maintaining SR by her report  Tikosyn teaching re-enforced today Labs  today  NICM Suspect tachy-mediated Chronic CHF MR Felt to be functional LVEF and MR are improved with SR No symptoms/exam findings of volume OL today  HTN Looks OK  6.  Secondary hypercoagulable state   Disposition: F/u with [place] in 3 mo, sooner if needed  Current medicines are reviewed at length with the patient today.  The patient did not have any concerns regarding medicines.  Venetia Night, PA-C 04/17/2022 9:12 AM     [facility] [address] [place] [address] [phone] (office)  [phone] (fax)

## 2022-04-17 ENCOUNTER — Ambulatory Visit: Payer: 59 | Attending: Physician Assistant | Admitting: Physician Assistant

## 2022-04-17 ENCOUNTER — Encounter: Payer: Self-pay | Admitting: Physician Assistant

## 2022-04-17 VITALS — BP 130/80 | HR 89 | Ht 72.0 in | Wt 172.0 lb

## 2022-04-17 DIAGNOSIS — I5022 Chronic systolic (congestive) heart failure: Secondary | ICD-10-CM | POA: Diagnosis not present

## 2022-04-17 DIAGNOSIS — I4819 Other persistent atrial fibrillation: Secondary | ICD-10-CM

## 2022-04-17 DIAGNOSIS — Z5181 Encounter for therapeutic drug level monitoring: Secondary | ICD-10-CM

## 2022-04-17 DIAGNOSIS — D6869 Other thrombophilia: Secondary | ICD-10-CM | POA: Diagnosis not present

## 2022-04-17 DIAGNOSIS — Z79899 Other long term (current) drug therapy: Secondary | ICD-10-CM

## 2022-04-17 DIAGNOSIS — I428 Other cardiomyopathies: Secondary | ICD-10-CM | POA: Diagnosis not present

## 2022-04-17 LAB — BASIC METABOLIC PANEL
BUN/Creatinine Ratio: 24 (ref 12–28)
BUN: 27 mg/dL (ref 8–27)
CO2: 23 mmol/L (ref 20–29)
Calcium: 10.1 mg/dL (ref 8.7–10.3)
Chloride: 105 mmol/L (ref 96–106)
Creatinine, Ser: 1.11 mg/dL — ABNORMAL HIGH (ref 0.57–1.00)
Glucose: 121 mg/dL — ABNORMAL HIGH (ref 70–99)
Potassium: 4.9 mmol/L (ref 3.5–5.2)
Sodium: 144 mmol/L (ref 134–144)
eGFR: 56 mL/min/{1.73_m2} — ABNORMAL LOW (ref >59)

## 2022-04-17 LAB — MAGNESIUM: Magnesium: 2 mg/dL (ref 1.6–2.3)

## 2022-04-17 NOTE — Patient Instructions (Signed)
Medication Instructions:   Your physician recommends that you continue on your current medications as directed. Please refer to the Current Medication list given to you today.  *If you need a refill on your cardiac medications before your next appointment, please call your pharmacy*   Lab Work:  [facility] AND [place]    If you have labs (blood work) drawn today and your tests are completely normal, you will receive your results only by: [place] (if you have MyChart) OR A paper copy in the mail If you have any lab test that is abnormal or we need to change your treatment, we will call you to review the results.   Testing/Procedures: NONE ORDERED  TODAY      Follow-Up: At [facility], you and your health needs are our priority.  As part of our continuing mission to provide you with exceptional heart care, we have created designated Provider Care Teams.  These Care Teams include your primary Cardiologist (physician) and Advanced Practice Providers (APPs -  Physician Assistants and Nurse Practitioners) who all work together to provide you with the care you need, when you need it.  We recommend signing up for the patient portal called "MyChart".  Sign up information is provided on this After Visit Summary.  MyChart is used to connect with patients for Virtual Visits (Telemedicine).  Patients are able to view lab/test results, encounter notes, upcoming appointments, etc.  Non-urgent messages can be sent to your provider as well.   To learn more about what you can do with MyChart, go to [email or web address].    Your next appointment:   2 month(s)  Provider:   You may see  or one of the following Advanced Practice Providers on your designated Care Team:   Tommye Standard, [place]  Other Instructions

## 2022-04-26 DIAGNOSIS — I5033 Acute on chronic diastolic (congestive) heart failure: Secondary | ICD-10-CM | POA: Diagnosis not present

## 2022-04-26 DIAGNOSIS — I480 Paroxysmal atrial fibrillation: Secondary | ICD-10-CM | POA: Diagnosis not present

## 2022-04-26 DIAGNOSIS — E064 Drug-induced thyroiditis: Secondary | ICD-10-CM | POA: Diagnosis not present

## 2022-04-26 DIAGNOSIS — J449 Chronic obstructive pulmonary disease, unspecified: Secondary | ICD-10-CM | POA: Diagnosis not present

## 2022-05-05 ENCOUNTER — Other Ambulatory Visit: Payer: Self-pay | Admitting: Student

## 2022-05-07 ENCOUNTER — Telehealth: Payer: Self-pay | Admitting: Internal Medicine

## 2022-05-07 NOTE — Telephone Encounter (Signed)
Called patient to schedule Medicare Annual Wellness Visit (AWV). No voicemail available to leave a message.  Last date of AWV: awvi 09/06/18 per [place]     If any questions, please contact me at [phone].  Thank you ,  Barkley Boards AWV direct phone # [phone]

## 2022-05-27 DIAGNOSIS — I480 Paroxysmal atrial fibrillation: Secondary | ICD-10-CM | POA: Diagnosis not present

## 2022-05-27 DIAGNOSIS — I5033 Acute on chronic diastolic (congestive) heart failure: Secondary | ICD-10-CM | POA: Diagnosis not present

## 2022-05-27 DIAGNOSIS — J449 Chronic obstructive pulmonary disease, unspecified: Secondary | ICD-10-CM | POA: Diagnosis not present

## 2022-05-27 DIAGNOSIS — E064 Drug-induced thyroiditis: Secondary | ICD-10-CM | POA: Diagnosis not present

## 2022-06-14 ENCOUNTER — Ambulatory Visit (HOSPITAL_COMMUNITY): Payer: Medicare Other | Admitting: Physician Assistant

## 2022-06-24 NOTE — Progress Notes (Deleted)
Cardiology Office Note Date:  06/24/2022  Patient ID:  Aquia, Hettinger 10/27/1960, MRN [id number] PCP:  Marcine Matar, MD  Cardiologist:  Dr. Elease Hashimoto Electrophysiologist: Dr. Elberta Fortis     Chief Complaint:  *** 3 mo  History of Present Illness: Lorrain Duru is a 62 y.o. female with history of HTN, NICM, chronic CHF (systolic), VHD (severe MR, functional) AFib.  Admitted for Tikosyn initiation Dec 2023, during which she converted with drug, did have a 4.2second post termination pause and her dig stopped, coreg reduced. She saw the Afib clinic as usual in f/u, maintaining SR  Missed/cancelled her 1 mo f/u  She saw HF team 02/12/22, maintaining SR, planned for TEE/LHC to start eval for possible MV intervention/TEER  TEE LVEF 40-45%mild MR (No cath) No longer felt need for structural heart team/MV intervention  I saw her 04/17/22,  Cardiac-wise she has no complaints or concerns, says her heart has been "great". She denies any CP, palpitations or cardiac awareness No SOB No near syncope or syncope She is in a lot of pain with R wrist and L hip/buttock pain, now for weeks, saw her PMD a bit ago without much help she reports. She has had intermittent flare ups of this pain over the years. I have advised her to reach out to her PMD to re-evaluate and manage this She reports excellent medication compliance. No changes were made, planned for 3 mo visit  *** Tikosyn EKG, labs, teaching, list *** eliquis, bleeding, dose *** burden, symptoms  AFib/AAD hx Diagnosed 2020 Hx of amiodarone started 2020 stopped 2/2 thyrotoxicosis Sept 2023 Tikosyn started Dec 2023    Past Medical History:  Diagnosis Date   Atrial fibrillation with RVR (HCC)    Chronic combined systolic (congestive) and diastolic (congestive) heart failure (HCC)    Dyspnea    Goiter    Hypertension    NICM (nonischemic cardiomyopathy) (HCC)    a. 08/2005 Echo: EF 30-35%, mod diff HK. Mild to Mod MR.  Mildly dil LA; b. 08/2005 Cath: Nl Cors. Elevated CO w/o shunt; c. 09/2007 Echo: EF 45%. Mild to mod MR; c. 03/2015 Echo: EF 45%, global HK. Gr1 DD. Mild MR. Mildly dil LA.    Past Surgical History:  Procedure Laterality Date   APPENDECTOMY     BACK SURGERY     CARDIOVERSION N/A 03/14/2018   Procedure: CARDIOVERSION;  Surgeon: Pricilla Riffle, MD;  Location: [facility] ENDOSCOPY;  Service: Cardiovascular;  Laterality: N/A;   TEE WITHOUT CARDIOVERSION N/A 03/14/2018   Procedure: TRANSESOPHAGEAL ECHOCARDIOGRAM (TEE);  Surgeon: Pricilla Riffle, MD;  Location: [facility] ENDOSCOPY;  Service: Cardiovascular;  Laterality: N/A;   TEE WITHOUT CARDIOVERSION N/A 02/26/2022   Procedure: TRANSESOPHAGEAL ECHOCARDIOGRAM (TEE);  Surgeon: Dorthula Nettles, DO;  Location: [facility] ENDOSCOPY;  Service: Cardiovascular;  Laterality: N/A;    Current Outpatient Medications  Medication Sig Dispense Refill   acetaminophen (TYLENOL) 500 MG tablet Take 500-1,000 mg by mouth every 6 (six) hours as needed (pain.).     carvedilol (COREG) 3.125 MG tablet Take 1 tablet (3.125 mg total) by mouth 2 (two) times daily with a meal. 180 tablet 3   dapagliflozin propanediol (FARXIGA) 10 MG TABS tablet Take 1 tablet (10 mg total) by mouth daily. 90 tablet 11   dofetilide (TIKOSYN) 500 MCG capsule Take 1 capsule (500 mcg total) by mouth 2 (two) times daily. 60 capsule 5   ELIQUIS 5 MG TABS tablet TAKE 1 TABLET BY MOUTH TWICE  DAILY 200 tablet  2   ENTRESTO 97-103 MG TAKE 1 TABLET BY MOUTH TWICE  DAILY 200 tablet 2   furosemide (LASIX) 40 MG tablet Take 1 tablet (40 mg total) by mouth daily. 90 tablet 3   methimazole (TAPAZOLE) 5 MG tablet TAKE 1 TABLET(5 MG) BY MOUTH DAILY 90 tablet 1   nicotine (NICODERM CQ - DOSED IN MG/24 HOURS) 14 mg/24hr patch Place 1 patch (14 mg total) onto the skin daily. 28 patch 2   potassium chloride (KLOR-CON) 10 MEQ tablet Take 1 tablet (10 mEq total) by mouth daily. 90 tablet 3   spironolactone (ALDACTONE) 25 MG tablet Take 0.5  tablets (12.5 mg total) by mouth daily. 45 tablet 3   No current facility-administered medications for this visit.    Allergies:   Patient has no known allergies.   Social History:  The patient  reports that she has been smoking cigarettes. She has a 60.00 pack-year smoking history. She has never used smokeless tobacco. She reports that she does not drink alcohol and does not use drugs.   Family History:  The patient's family history includes Cancer in her mother; Heart disease in her father.  ROS:  Please see the history of present illness.    All other systems are reviewed and otherwise negative.   PHYSICAL EXAM:  VS:  LMP 06/10/2011  BMI: There is no height or weight on file to calculate BMI. Well nourished, well developed, in no acute distress HEENT: normocephalic, atraumatic Neck: no JVD, carotid bruits or masses Cardiac: *** RRR; no significant murmurs, no rubs, or gallops Lungs: *** CTA b/l, no wheezing, rhonchi or rales Abd: soft, nontender MS: no deformity or atrophy Ext: *** no edema Skin: warm and dry, no rash Neuro:  No gross deficits appreciated Psych: euthymic mood, full affect   EKG:  Done today and reviewed by myself shows  ***  02/26/22: TEE LV: Mildly reduced LVEF, 40-45% RV: Normal size and function Mitral valve: Mild central MR.  Tricuspid valve: trivial TR Pulmonic valve: trivial PI LA: mildly dilated RA: normal size.   12/21/21: TTE 1. No significant change from echo done in 2020.   2. Left ventricular ejection fraction, by estimation, is 25 to 30%. The  left ventricle has severely decreased function. The left ventricle  demonstrates global hypokinesis. The left ventricular internal cavity size  was moderately to severely dilated.  There is mild left ventricular hypertrophy. Left ventricular diastolic  parameters are indeterminate.   3. Right ventricular systolic function is low normal. The right  ventricular size is normal. There is moderately  elevated pulmonary artery  systolic pressure.   4. Left atrial size was severely dilated.   5. MR is eccentric, posteriorly directed into LA . The mitral valve is  abnormal. Severe mitral valve regurgitation.   6. Tricuspid valve regurgitation is mild to moderate.   7. The aortic valve is tricuspid. Aortic valve regurgitation is trivial.  Aortic valve sclerosis is present, with no evidence of aortic valve  stenosis.   8. The inferior vena cava is dilated in size with <50% respiratory  variability, suggesting right atrial pressure of 15 mmHg.    10/04/2020: TTE 1. Left ventricular ejection fraction, by estimation, is 55 to 60%. The  left ventricle has normal function. The left ventricle has no regional  wall motion abnormalities. There is moderate left ventricular hypertrophy.  Left ventricular diastolic  parameters are indeterminate.   2. Right ventricular systolic function is normal. The right ventricular  size is  normal.   3. Left atrial size was severely dilated.   4. The mitral valve is grossly normal. Moderate mitral valve  regurgitation.   5. The aortic valve is normal in structure. Aortic valve regurgitation is  not visualized. No aortic stenosis is present.    Recent Labs: 12/21/2021: ALT 46 01/15/2022: B Natriuretic Peptide 113.2 02/26/2022: Hemoglobin 14.8; Platelets 386 03/06/2022: TSH 3.510 04/17/2022: BUN 27; Creatinine, Ser 1.11; Magnesium 2.0; Potassium 4.9; Sodium 144  No results found for requested labs within last 365 days.   CrCl cannot be calculated (Patient's most recent lab result is older than the maximum 21 days allowed.).   Wt Readings from Last 3 Encounters:  04/17/22 172 lb (78 kg)  03/06/22 178 lb (80.7 kg)  02/26/22 179 lb (81.2 kg)     Other studies reviewed: Additional studies/records reviewed today include: summarized above  ASSESSMENT AND PLAN:  Persistent AFib CHA2DS2Vasc is 3, on eliquis, *** appropriately dosed Tikosyn, with ***  stable QTc *** Maintaining SR by her report  *** Tikosyn teaching re-enforced today *** Labs today  NICM Suspect tachy-mediated Chronic CHF MR Felt to be functional LVEF and MR are improved with SR *** No symptoms/exam findings of volume OL today  HTN *** Looks OK  6.  Secondary hypercoagulable state   Disposition: ***  Current medicines are reviewed at length with the patient today.  The patient did not have any concerns regarding medicines.  Norma Fredrickson, PA-C 06/24/2022 9:31 AM     [facility] [address] [place] [address] [phone] (office)  [phone] (fax)

## 2022-06-25 ENCOUNTER — Ambulatory Visit: Payer: 59 | Attending: Physician Assistant | Admitting: Physician Assistant

## 2022-06-26 ENCOUNTER — Encounter: Payer: Self-pay | Admitting: Physician Assistant

## 2022-06-26 DIAGNOSIS — J449 Chronic obstructive pulmonary disease, unspecified: Secondary | ICD-10-CM | POA: Diagnosis not present

## 2022-06-26 DIAGNOSIS — E064 Drug-induced thyroiditis: Secondary | ICD-10-CM | POA: Diagnosis not present

## 2022-06-26 DIAGNOSIS — I5033 Acute on chronic diastolic (congestive) heart failure: Secondary | ICD-10-CM | POA: Diagnosis not present

## 2022-06-26 DIAGNOSIS — I480 Paroxysmal atrial fibrillation: Secondary | ICD-10-CM | POA: Diagnosis not present

## 2022-07-04 ENCOUNTER — Other Ambulatory Visit: Payer: Self-pay | Admitting: Cardiovascular Disease

## 2022-07-04 ENCOUNTER — Other Ambulatory Visit (HOSPITAL_COMMUNITY): Payer: Self-pay | Admitting: Cardiology

## 2022-07-04 DIAGNOSIS — I5022 Chronic systolic (congestive) heart failure: Secondary | ICD-10-CM

## 2022-07-05 NOTE — Telephone Encounter (Signed)
Pt's pharmacy is requesting a refill on methimazole. Would Dr Elease Hashimoto like to refill this medication? Please address

## 2022-07-08 ENCOUNTER — Other Ambulatory Visit: Payer: Self-pay | Admitting: Cardiovascular Disease

## 2022-07-08 DIAGNOSIS — I4819 Other persistent atrial fibrillation: Secondary | ICD-10-CM

## 2022-07-09 NOTE — Telephone Encounter (Signed)
Prescription refill request for Eliquis received. Indication:afib Last office visit:3/24 Scr:1.11  3/24 Age: 62 Weight:78  kg  Prescription refilled

## 2022-07-15 ENCOUNTER — Other Ambulatory Visit: Payer: Self-pay | Admitting: Cardiovascular Disease

## 2022-07-16 ENCOUNTER — Ambulatory Visit (HOSPITAL_COMMUNITY)
Admission: RE | Admit: 2022-07-16 | Discharge: 2022-07-16 | Disposition: A | Discharge status: Home / Self Care | Payer: 59 | Source: Ambulatory Visit | Attending: Internal Medicine | Admitting: Internal Medicine

## 2022-07-16 ENCOUNTER — Encounter (HOSPITAL_COMMUNITY): Payer: Self-pay | Admitting: Internal Medicine

## 2022-07-16 VITALS — BP 122/74 | HR 68 | Ht 72.0 in | Wt 169.0 lb

## 2022-07-16 DIAGNOSIS — I4819 Other persistent atrial fibrillation: Secondary | ICD-10-CM | POA: Diagnosis not present

## 2022-07-16 DIAGNOSIS — Z79899 Other long term (current) drug therapy: Secondary | ICD-10-CM | POA: Diagnosis not present

## 2022-07-16 DIAGNOSIS — Z7901 Long term (current) use of anticoagulants: Secondary | ICD-10-CM | POA: Diagnosis not present

## 2022-07-16 DIAGNOSIS — D6869 Other thrombophilia: Secondary | ICD-10-CM | POA: Diagnosis not present

## 2022-07-16 DIAGNOSIS — I110 Hypertensive heart disease with heart failure: Secondary | ICD-10-CM | POA: Diagnosis not present

## 2022-07-16 DIAGNOSIS — F17210 Nicotine dependence, cigarettes, uncomplicated: Secondary | ICD-10-CM | POA: Diagnosis not present

## 2022-07-16 DIAGNOSIS — I5022 Chronic systolic (congestive) heart failure: Secondary | ICD-10-CM | POA: Diagnosis not present

## 2022-07-16 DIAGNOSIS — Z5181 Encounter for therapeutic drug level monitoring: Secondary | ICD-10-CM | POA: Insufficient documentation

## 2022-07-16 DIAGNOSIS — Z8249 Family history of ischemic heart disease and other diseases of the circulatory system: Secondary | ICD-10-CM | POA: Insufficient documentation

## 2022-07-16 DIAGNOSIS — I480 Paroxysmal atrial fibrillation: Secondary | ICD-10-CM | POA: Diagnosis present

## 2022-07-16 LAB — MAGNESIUM: Magnesium: 2.5 mg/dL — ABNORMAL HIGH (ref 1.7–2.4)

## 2022-07-16 LAB — BASIC METABOLIC PANEL
Anion gap: 11 (ref 5–15)
BUN: 41 mg/dL — ABNORMAL HIGH (ref 8–23)
CO2: 21 mmol/L — ABNORMAL LOW (ref 22–32)
Calcium: 9.2 mg/dL (ref 8.9–10.3)
Chloride: 107 mmol/L (ref 98–111)
Creatinine, Ser: 1.6 mg/dL — ABNORMAL HIGH (ref 0.44–1.00)
GFR, Estimated: 36 mL/min — ABNORMAL LOW (ref >60)
Glucose, Bld: 92 mg/dL (ref 70–99)
Potassium: 4.9 mmol/L (ref 3.5–5.1)
Sodium: 139 mmol/L (ref 135–145)

## 2022-07-16 MED ORDER — DOFETILIDE 500 MCG PO CAPS: 500.0000 ug | ORAL_CAPSULE | Freq: Two times a day (BID) | ORAL | 5 refills | Status: DC

## 2022-07-16 NOTE — Progress Notes (Signed)
Primary Care Physician: Marcine Matar, MD Primary Cardiologist: Dr Elease Hashimoto Primary Electrophysiologist: Dr Elberta Fortis [facility]: Dr Gasper Lloyd Referring Physician: Manson Passey PA   Joanna [name] is a 62 y.o. female with a history of HTN, combined chronic systolic and diastolic CHF, tobacco abuse, MR, and persistent atrial fibrillation who presents for follow up in the [facility].  The patient was initially diagnosed with atrial fibrillation on 03/08/18 at the ER after presenting with symptoms of dyspnea on exertion for one week prior. ECG at ER showed afib with RVR and she was given Lopressor and started on Cardizem drip and heparin. She underwent successful DCCV. Echo on admission showed severely reduced EF 20-25%, possibly tachycardia mediated. Discharged on Eliquis and Lopressor. On follow up, she was noted to be in afib again with RVR. Started on amiodarone which did well keeping her in SR. Unfortunately, she developed amiodarone induced hyperthyroidism and this was discontinued 10/16/21.  Patient was hospitalized 12/20/21 with acute CHF in the setting of rapid Afib. Echo showed EF back down again, 25-30% w/ global HK, LV mod-severely dilated, LA severely dilated w/ eccentric MR, posteriorly directed into LA, severe regurgitation. She was diuresed w/ IV Lasix and transitioned to PO with additions of Farxiga and spironolactone. Treated w/ Coreg and digoxin for rate control.   On follow up today, patient is s/p dofetilide admission 12/20-12/23/23. She converted with the medication and did not require DCCV. She had a post termination pause lasting 4.2 seconds, digoxin was discontinued and carvedilol decreased. Patient remains in SR today and is feeling well. She walked into the clinic instead of using the wheelchair.   On follow up 07/16/22, she is currently in NSR. S/p Tikosyn load 12/20-23/23. Seen by Francis Dowse 04/17/22 with stable Qtc and maintaining SR. She has  not noted any episodes of Afib since OV with [name]. She complains of right leg pain ongoing which is not new; she uses a cane for ambulation. She needs a refill of Tikosyn as she took her last dose this morning. No missed doses of anticoagulation.  Today, she denies symptoms of palpitations, chest pain, orthopnea, PND, lower extremity edema, dizziness, presyncope, syncope, snoring, daytime somnolence, bleeding, or neurologic sequela. The patient is tolerating medications without difficulties and is otherwise without complaint today.    Atrial Fibrillation Risk Factors:  she does not have symptoms or diagnosis of sleep apnea. she does not have a history of rheumatic fever. she does not have a history of alcohol use. The patient does not have a history of early familial atrial fibrillation or other arrhythmias.  she has a BMI of Body mass index is 22.92 kg/m.Marland Kitchen Filed Weights   07/16/22 1433  Weight: 76.7 kg     Family History  Problem Relation Age of Onset   Cancer Mother    Heart disease Father     Atrial Fibrillation Management history:  Previous antiarrhythmic drugs: amiodarone, dofetilide  Previous cardioversions: 03/14/2018 Previous ablations: none CHADS2VASC score: 3  Anticoagulation history: Eliquis   Past Medical History:  Diagnosis Date   Atrial fibrillation with RVR (HCC)    Chronic combined systolic (congestive) and diastolic (congestive) heart failure (HCC)    Dyspnea    Goiter    Hypertension    NICM (nonischemic cardiomyopathy) (HCC)    a. 08/2005 Echo: EF 30-35%, mod diff HK. Mild to Mod MR. Mildly dil LA; b. 08/2005 Cath: Nl Cors. Elevated CO w/o shunt; c. 09/2007 Echo: EF 45%. Mild to mod MR;  c. 03/2015 Echo: EF 45%, global HK. Gr1 DD. Mild MR. Mildly dil LA.   Past Surgical History:  Procedure Laterality Date   APPENDECTOMY     BACK SURGERY     CARDIOVERSION N/A 03/14/2018   Procedure: CARDIOVERSION;  Surgeon: Pricilla Riffle, MD;  Location: [facility] ENDOSCOPY;   Service: Cardiovascular;  Laterality: N/A;   TEE WITHOUT CARDIOVERSION N/A 03/14/2018   Procedure: TRANSESOPHAGEAL ECHOCARDIOGRAM (TEE);  Surgeon: Pricilla Riffle, MD;  Location: [facility] ENDOSCOPY;  Service: Cardiovascular;  Laterality: N/A;   TEE WITHOUT CARDIOVERSION N/A 02/26/2022   Procedure: TRANSESOPHAGEAL ECHOCARDIOGRAM (TEE);  Surgeon: Dorthula Nettles, DO;  Location: [facility] ENDOSCOPY;  Service: Cardiovascular;  Laterality: N/A;    Current Outpatient Medications  Medication Sig Dispense Refill   carvedilol (COREG) 3.125 MG tablet Take 1 tablet (3.125 mg total) by mouth 2 (two) times daily with a meal. 180 tablet 3   dapagliflozin propanediol (FARXIGA) 10 MG TABS tablet Take 1 tablet (10 mg total) by mouth daily. 90 tablet 11   ELIQUIS 5 MG TABS tablet TAKE 1 TABLET BY MOUTH TWICE  DAILY 200 tablet 2   ENTRESTO 97-103 MG TAKE 1 TABLET BY MOUTH TWICE  DAILY 200 tablet 2   furosemide (LASIX) 40 MG tablet Take 1 tablet (40 mg total) by mouth daily. 90 tablet 3   methimazole (TAPAZOLE) 5 MG tablet TAKE 1 TABLET(5 MG) BY MOUTH DAILY 90 tablet 1   potassium chloride (KLOR-CON) 10 MEQ tablet Take 1 tablet (10 mEq total) by mouth daily. 90 tablet 3   spironolactone (ALDACTONE) 25 MG tablet TAKE 1/2 TABLET(12.5 MG) BY MOUTH DAILY 45 tablet 3   acetaminophen (TYLENOL) 500 MG tablet Take 500-1,000 mg by mouth every 6 (six) hours as needed (pain.). (Patient not taking: Reported on 07/16/2022)     dofetilide (TIKOSYN) 500 MCG capsule Take 1 capsule (500 mcg total) by mouth 2 (two) times daily. 60 capsule 5   nicotine (NICODERM CQ - DOSED IN MG/24 HOURS) 14 mg/24hr patch Place 1 patch (14 mg total) onto the skin daily. (Patient not taking: Reported on 07/16/2022) 28 patch 2   No current facility-administered medications for this encounter.    No Known Allergies   Social History   Socioeconomic History   Marital status: Single    Spouse name: Not on file   Number of children: 1   Years of education: Not on  file   Highest education level: High school graduate  Occupational History   Occupation: unemployed    Comment: on disablity  Tobacco Use   Smoking status: Every Day    Packs/day: 2.00    Years: 30.00    Additional pack years: 0.00    Total pack years: 60.00    Types: Cigarettes    Last attempt to quit: 02/08/2018    Years since quitting: 4.4   Smokeless tobacco: Never   Tobacco comments:    3 cigarettes daily 02/09/22  Vaping Use   Vaping Use: Never used  Substance and Sexual Activity   Alcohol use: No    Comment: quit 2 years ago   Drug use: No   Sexual activity: Not on file  Other Topics Concern   Not on file  Social History Narrative   Not on file   Social Determinants of Health   Financial Resource Strain: Low Risk  (12/22/2021)   Overall Financial Resource Strain (CARDIA)    Difficulty of Paying Living Expenses: Not very hard  Food Insecurity: No Food Insecurity (12/26/2021)  Hunger Vital Sign    Worried About Running Out of Food in the Last Year: Never true    Ran Out of Food in the Last Year: Never true  Transportation Needs: No Transportation Needs (12/26/2021)   PRAPARE - Administrator, Civil Service (Medical): No    Lack of Transportation (Non-Medical): No  Physical Activity: Not on file  Stress: Not on file  Social Connections: Not on file  Intimate Partner Violence: Not At Risk (12/21/2021)   Humiliation, Afraid, Rape, and Kick questionnaire    Fear of Current or Ex-Partner: No    Emotionally Abused: No    Physically Abused: No    Sexually Abused: No     ROS- All systems are reviewed and negative except as per the HPI above.  Physical Exam: Vitals:   07/16/22 1433  BP: 122/74  Pulse: 68  Weight: 76.7 kg  Height: 6' (1.829 m)    GEN- The patient is well appearing, alert and oriented x 3 today.   Head- normocephalic, atraumatic Eyes-  Sclera clear, conjunctiva pink Ears- hearing intact Lungs- Clear to ausculation bilaterally,  normal work of breathing Heart- Regular rate and rhythm, no murmurs, rubs or gallops, PMI not laterally displaced Extremities- no clubbing, cyanosis, or edema MS- no significant deformity or atrophy Skin- no rash or lesion Psych- euthymic mood, full affect Neuro- strength and sensation are intact  Wt Readings from Last 3 Encounters:  07/16/22 76.7 kg  04/17/22 78 kg  03/06/22 80.7 kg    EKG today demonstrates Vent. rate 68 BPM PR interval 174 ms QRS duration 90 ms QT/QTcB 442/469 ms P-R-T axes 91 72 79 Normal sinus rhythm Normal ECG When compared with ECG of 12-Feb-2022 09:13, PREVIOUS ECG IS PRESENT  Echo 12/21/21 demonstrated   1. No significant change from echo done in 2020.   2. Left ventricular ejection fraction, by estimation, is 25 to 30%. The  left ventricle has severely decreased function. The left ventricle  demonstrates global hypokinesis. The left ventricular internal cavity size  was moderately to severely dilated.  There is mild left ventricular hypertrophy. Left ventricular diastolic  parameters are indeterminate.   3. Right ventricular systolic function is low normal. The right  ventricular size is normal. There is moderately elevated pulmonary artery  systolic pressure.   4. Left atrial size was severely dilated.   5. MR is eccentric, posteriorly directed into LA . The mitral valve is  abnormal. Severe mitral valve regurgitation.   6. Tricuspid valve regurgitation is mild to moderate.   7. The aortic valve is tricuspid. Aortic valve regurgitation is trivial.  Aortic valve sclerosis is present, with no evidence of aortic valve  stenosis.   8. The inferior vena cava is dilated in size with <50% respiratory  variability, suggesting right atrial pressure of 15 mmHg.   Epic records are reviewed at length today   CHA2DS2-VASc Score = 3  The patient's score is based upon: CHF History: 1 HTN History: 1 Diabetes History: 0 Stroke History: 0 Vascular  Disease History: 0 Age Score: 0 Gender Score: 1       ASSESSMENT AND PLAN: 1. Persistent Atrial Fibrillation (ICD10:  I48.19) The patient's CHA2DS2-VASc score is 3, indicating a 3.2% annual risk of stroke.   Patient off amiodarone due to hyperthyroidism, on methimazole.  S/p dofetilide admission 12/20-12/23/23 Patient appears to be maintaining SR. Continue dofetilide 500 mcg BID. QT stable. Check bmet/mag today. Continue Eliquis 5 mg BID Continue carvedilol 3.125 mg  BID  2. Secondary Hypercoagulable State (ICD10:  D68.69) The patient is at significant risk for stroke/thromboembolism based upon her CHA2DS2-VASc Score of 3.  Continue Apixaban (Eliquis).   3. Chronic systolic CHF Suspected tachycardia mediated.  GDMT per [facility], followed by Dr Gasper Lloyd. Appears euvolemic today.    Follow up in 3 months Afib clinic.   Lake Bells, PA-C Afib Clinic [facility] [address] [place], [place] [address] [phone] 07/16/2022 3:08 PM

## 2022-07-16 NOTE — Progress Notes (Unsigned)
Subjective:   Joanna [name] is a 62 y.o. female who presents for an Initial Medicare Annual Wellness Visit.  Review of Systems    ***       Objective:    There were no vitals filed for this visit. There is no height or weight on file to calculate BMI.     02/26/2022    7:52 AM 12/21/2021    3:00 PM 03/10/2018   10:58 AM 03/08/2018    9:16 AM 02/08/2018    5:30 PM 04/11/2015   10:58 AM 04/02/2015    1:05 PM  Advanced Directives  Does Patient Have a Medical Advance Directive? No No No No No No No  Would patient like information on creating a medical advance directive?  No - Patient declined No - Patient declined  No - Patient declined No - patient declined information No - patient declined information    Current Medications (verified) Outpatient Encounter Medications as of 07/17/2022  Medication Sig   acetaminophen (TYLENOL) 500 MG tablet Take 500-1,000 mg by mouth every 6 (six) hours as needed (pain.). (Patient not taking: Reported on 07/16/2022)   carvedilol (COREG) 3.125 MG tablet Take 1 tablet (3.125 mg total) by mouth 2 (two) times daily with a meal.   dapagliflozin propanediol (FARXIGA) 10 MG TABS tablet Take 1 tablet (10 mg total) by mouth daily.   dofetilide (TIKOSYN) 500 MCG capsule Take 1 capsule (500 mcg total) by mouth 2 (two) times daily.   ELIQUIS 5 MG TABS tablet TAKE 1 TABLET BY MOUTH TWICE  DAILY   ENTRESTO 97-103 MG TAKE 1 TABLET BY MOUTH TWICE  DAILY   furosemide (LASIX) 40 MG tablet Take 1 tablet (40 mg total) by mouth daily.   methimazole (TAPAZOLE) 5 MG tablet TAKE 1 TABLET(5 MG) BY MOUTH DAILY   nicotine (NICODERM CQ - DOSED IN MG/24 HOURS) 14 mg/24hr patch Place 1 patch (14 mg total) onto the skin daily. (Patient not taking: Reported on 07/16/2022)   potassium chloride (KLOR-CON) 10 MEQ tablet Take 1 tablet (10 mEq total) by mouth daily.   spironolactone (ALDACTONE) 25 MG tablet TAKE 1/2 TABLET(12.5 MG) BY MOUTH DAILY   No facility-administered encounter  medications on file as of 07/17/2022.    Allergies (verified) Patient has no known allergies.   History: Past Medical History:  Diagnosis Date   Atrial fibrillation with RVR (HCC)    Chronic combined systolic (congestive) and diastolic (congestive) heart failure (HCC)    Dyspnea    Goiter    Hypertension    NICM (nonischemic cardiomyopathy) (HCC)    a. 08/2005 Echo: EF 30-35%, mod diff HK. Mild to Mod MR. Mildly dil LA; b. 08/2005 Cath: Nl Cors. Elevated CO w/o shunt; c. 09/2007 Echo: EF 45%. Mild to mod MR; c. 03/2015 Echo: EF 45%, global HK. Gr1 DD. Mild MR. Mildly dil LA.   Past Surgical History:  Procedure Laterality Date   APPENDECTOMY     BACK SURGERY     CARDIOVERSION N/A 03/14/2018   Procedure: CARDIOVERSION;  Surgeon: Pricilla Riffle, MD;  Location: [facility] ENDOSCOPY;  Service: Cardiovascular;  Laterality: N/A;   TEE WITHOUT CARDIOVERSION N/A 03/14/2018   Procedure: TRANSESOPHAGEAL ECHOCARDIOGRAM (TEE);  Surgeon: Pricilla Riffle, MD;  Location: [facility] ENDOSCOPY;  Service: Cardiovascular;  Laterality: N/A;   TEE WITHOUT CARDIOVERSION N/A 02/26/2022   Procedure: TRANSESOPHAGEAL ECHOCARDIOGRAM (TEE);  Surgeon: Dorthula Nettles, DO;  Location: [facility] ENDOSCOPY;  Service: Cardiovascular;  Laterality: N/A;   Family History  Problem Relation  Age of Onset   Cancer Mother    Heart disease Father    Social History   Socioeconomic History   Marital status: Single    Spouse name: Not on file   Number of children: 1   Years of education: Not on file   Highest education level: High school graduate  Occupational History   Occupation: unemployed    Comment: on disablity  Tobacco Use   Smoking status: Every Day    Packs/day: 2.00    Years: 30.00    Additional pack years: 0.00    Total pack years: 60.00    Types: Cigarettes    Last attempt to quit: 02/08/2018    Years since quitting: 4.4   Smokeless tobacco: Never   Tobacco comments:    3 cigarettes daily 02/09/22  Vaping Use   Vaping Use: Never  used  Substance and Sexual Activity   Alcohol use: No    Comment: quit 2 years ago   Drug use: No   Sexual activity: Not on file  Other Topics Concern   Not on file  Social History Narrative   Not on file   Social Determinants of Health   Financial Resource Strain: Low Risk  (12/22/2021)   Overall Financial Resource Strain (CARDIA)    Difficulty of Paying Living Expenses: Not very hard  Food Insecurity: No Food Insecurity (12/26/2021)   Hunger Vital Sign    Worried About Running Out of Food in the Last Year: Never true    Ran Out of Food in the Last Year: Never true  Transportation Needs: No Transportation Needs (12/26/2021)   PRAPARE - Administrator, Civil Service (Medical): No    Lack of Transportation (Non-Medical): No  Physical Activity: Not on file  Stress: Not on file  Social Connections: Not on file    Tobacco Counseling Ready to quit: Not Answered Counseling given: Not Answered Tobacco comments: 3 cigarettes daily 02/09/22   Clinical Intake:                 Diabetic?No          Activities of Daily Living    12/21/2021    3:00 PM  In your present state of health, do you have any difficulty performing the following activities:  Hearing? 0  Vision? 0  Difficulty concentrating or making decisions? 0  Walking or climbing stairs? 0  Dressing or bathing? 0  Doing errands, shopping? 0    Patient Care Team: Marcine Matar, MD as PCP - General (Internal Medicine) Nahser, Deloris Ping, MD as PCP - Cardiology (Cardiology)  Indicate any recent Medical Services you may have received from other than [facility] providers in the past year (date may be approximate).     Assessment:   This is a routine wellness examination for Joanna [name].  Hearing/Vision screen No results found.  Dietary issues and exercise activities discussed:     Goals Addressed   None    Depression Screen    03/06/2022    2:48 PM 03/17/2018    2:02 PM 04/11/2015    10:58 AM  PHQ 2/9 Scores  PHQ - 2 Score 0 0 0  PHQ- 9 Score 0      Fall Risk     No data to display          FALL RISK PREVENTION PERTAINING TO THE HOME:  Any stairs in or around the home? {YES/NO:21197} If so, are there any without handrails? {YES/NO:21197} Home  free of loose throw rugs in walkways, pet beds, electrical cords, etc? {YES/NO:21197} Adequate lighting in your home to reduce risk of falls? {YES/NO:21197}  ASSISTIVE DEVICES UTILIZED TO PREVENT FALLS:  Life alert? {YES/NO:21197} Use of a cane, walker or w/c? {YES/NO:21197} Grab bars in the bathroom? {YES/NO:21197} Shower chair or bench in shower? {YES/NO:21197} Elevated toilet seat or a handicapped toilet? {YES/NO:21197}  TIMED UP AND GO:  Was the test performed? No . Telephonic visit   Cognitive Function:        Immunizations Immunization History  Administered Date(s) Administered   Influenza,inj,Quad PF,6+ Mos 02/10/2018   PPD Test 11/01/2014   Pneumococcal Polysaccharide-23 02/10/2018    {TDAP status:2101805}  Pneumococcal vaccine status: Up to date  Covid-19 vaccine status: Information provided on how to obtain vaccines.   Qualifies for Shingles Vaccine? Yes   Zostavax completed No   Shingrix Completed?: No.    Education has been provided regarding the importance of this vaccine. Patient has been advised to call insurance company to determine out of pocket expense if they have not yet received this vaccine. Advised may also receive vaccine at local pharmacy or Health Dept. Verbalized acceptance and understanding.  Screening Tests Health Maintenance  Topic Date Due   Medicare Annual Wellness (AWV)  Never done   COVID-19 Vaccine (1) Never done   Hepatitis C Screening  Never done   DTaP/Tdap/Td (1 - Tdap) Never done   PAP SMEAR-Modifier  Never done   Fecal DNA (Cologuard)  Never done   Lung Cancer Screening  08/14/2009   MAMMOGRAM  Never done   Zoster Vaccines- Shingrix (1 of 2) 02/25/2023  (Originally 08/15/1979)   INFLUENZA VACCINE  09/06/2022   HIV Screening  Completed   HPV VACCINES  Aged Out    Health Maintenance  Health Maintenance Due  Topic Date Due   Medicare Annual Wellness (AWV)  Never done   COVID-19 Vaccine (1) Never done   Hepatitis C Screening  Never done   DTaP/Tdap/Td (1 - Tdap) Never done   PAP SMEAR-Modifier  Never done   Fecal DNA (Cologuard)  Never done   Lung Cancer Screening  08/14/2009   MAMMOGRAM  Never done    {Colorectal cancer screening:2101809}  {Mammogram status:21018020}  Lung Cancer Screening: (Low Dose CT Chest recommended if Age 75-80 years, 30 pack-year currently smoking OR have quit w/in 15years.) does qualify.   Lung Cancer Screening Referral: ***  Additional Screening:  Hepatitis C Screening: {DOES NOT does:27190::"does not"} qualify; Completed ***  Vision Screening: Recommended annual ophthalmology exams for early detection of glaucoma and other disorders of the eye. Is the patient up to date with their annual eye exam?  {YES/NO:21197} Who is the provider or what is the name of the office in which the patient attends annual eye exams? *** If pt is not established with a provider, would they like to be referred to a provider to establish care? {YES/NO:21197}.   Dental Screening: Recommended annual dental exams for proper oral hygiene  Community Resource Referral / Chronic Care Management: CRR required this visit?  {YES/NO:21197}  CCM required this visit?  {YES/NO:21197}     Plan:     I have personally reviewed and noted the following in the patient's chart:   Medical and social history Use of alcohol, tobacco or illicit drugs  Current medications and supplements including opioid prescriptions. {Opioid Prescriptions:[phone]} Functional ability and status Nutritional status Physical activity Advanced directives List of other physicians Hospitalizations, surgeries, and ER visits in previous  12  months Vitals Screenings to include cognitive, depression, and falls Referrals and appointments  In addition, I have reviewed and discussed with patient certain preventive protocols, quality metrics, and best practice recommendations. A written personalized care plan for preventive services as well as general preventive health recommendations were provided to patient.     Durwin Nora, [place]   05/14/8117   Due to this being a virtual visit, the after visit summary with patients personalized plan was offered to patient via mail or my-chart. ***Patient declined at this time./ Patient would like to access on my-chart/ per request, patient was mailed a copy of AVS./ Patient preferred to pick up at office at next visit  Nurse Notes: ***

## 2022-07-16 NOTE — Patient Instructions (Incomplete)
Joanna [name] , Thank you for taking time to come for your Medicare Wellness Visit. I appreciate your ongoing commitment to your health goals. Please review the following plan we discussed and let me know if I can assist you in the future.   These are the goals we discussed:  Goals   None     This is a list of the screening recommended for you and due dates:  Health Maintenance  Topic Date Due   Medicare Annual Wellness Visit  Never done   COVID-19 Vaccine (1) Never done   Hepatitis C Screening  Never done   DTaP/Tdap/Td vaccine (1 - Tdap) Never done   Pap Smear  Never done   Cologuard (Stool DNA test)  Never done   Screening for Lung Cancer  08/14/2009   Mammogram  Never done   Zoster (Shingles) Vaccine (1 of 2) 02/25/2023*   Flu Shot  09/06/2022   HIV Screening  Completed   HPV Vaccine  Aged Out  *Topic was postponed. The date shown is not the original due date.    Advanced directives: Information on Advanced Care Planning can be found at [facility] Advance Health Care Directives Advance Health Care Directives ([email or web address] Please bring a copy of your health care power of attorney and living will to the office to be added to your chart at your convenience.   Conditions/risks identified: Aim for 30 minutes of exercise or brisk walking, 6-8 glasses of water, and 5 servings of fruits and vegetables each day.  Next appointment: Follow up in one year for your annual wellness visit.   Preventive Care 40-64 Years, Female Preventive care refers to lifestyle choices and visits with your health care provider that can promote health and wellness. What does preventive care include? A yearly physical exam. This is also called an annual well check. Dental exams once or twice a year. Routine eye exams. Ask your health care provider how often you should have your eyes checked. Personal lifestyle choices, including: Daily care of your teeth and gums. Regular physical  activity. Eating a healthy diet. Avoiding tobacco and drug use. Limiting alcohol use. Practicing safe sex. Taking low-dose aspirin daily starting at age 70. Taking vitamin and mineral supplements as recommended by your health care provider. What happens during an annual well check? The services and screenings done by your health care provider during your annual well check will depend on your age, overall health, lifestyle risk factors, and family history of disease. Counseling  Your health care provider may ask you questions about your: Alcohol use. Tobacco use. Drug use. Emotional well-being. Home and relationship well-being. Sexual activity. Eating habits. Work and work Astronomer. Method of birth control. Menstrual cycle. Pregnancy history. Screening  You may have the following tests or measurements: Height, weight, and BMI. Blood pressure. Lipid and cholesterol levels. These may be checked every 5 years, or more frequently if you are over 66 years old. Skin check. Lung cancer screening. You may have this screening every year starting at age 16 if you have a 30-pack-year history of smoking and currently smoke or have quit within the past 15 years. Fecal occult blood test (FOBT) of the stool. You may have this test every year starting at age 28. Flexible sigmoidoscopy or colonoscopy. You may have a sigmoidoscopy every 5 years or a colonoscopy every 10 years starting at age 34. Hepatitis C blood test. Hepatitis B blood test. Sexually transmitted disease (STD) testing. Diabetes screening. This is  done by checking your blood sugar (glucose) after you have not eaten for a while (fasting). You may have this done every 1-3 years. Mammogram. This may be done every 1-2 years. Talk to your health care provider about when you should start having regular mammograms. This may depend on whether you have a family history of breast cancer. BRCA-related cancer screening. This may be done if  you have a family history of breast, ovarian, tubal, or peritoneal cancers. Pelvic exam and Pap test. This may be done every 3 years starting at age 79. Starting at age 2, this may be done every 5 years if you have a Pap test in combination with an HPV test. Bone density scan. This is done to screen for osteoporosis. You may have this scan if you are at high risk for osteoporosis. Discuss your test results, treatment options, and if necessary, the need for more tests with your health care provider. Vaccines  Your health care provider may recommend certain vaccines, such as: Influenza vaccine. This is recommended every year. Tetanus, diphtheria, and acellular pertussis (Tdap, Td) vaccine. You may need a Td booster every 10 years. Zoster vaccine. You may need this after age 72. Pneumococcal 13-valent conjugate (PCV13) vaccine. You may need this if you have certain conditions and were not previously vaccinated. Pneumococcal polysaccharide (PPSV23) vaccine. You may need one or two doses if you smoke cigarettes or if you have certain conditions. Talk to your health care provider about which screenings and vaccines you need and how often you need them. This information is not intended to replace advice given to you by your health care provider. Make sure you discuss any questions you have with your health care provider. Document Released: 02/18/2015 Document Revised: 10/12/2015 Document Reviewed: 11/23/2014 Elsevier Interactive Patient Education  2017 [employer].    Fall Prevention in the Home Falls can cause injuries. They can happen to people of all ages. There are many things you can do to make your home safe and to help prevent falls. What can I do on the outside of my home? Regularly fix the edges of walkways and driveways and fix any cracks. Remove anything that might make you trip as you walk through a door, such as a raised step or threshold. Trim any bushes or trees on the path to your  home. Use bright outdoor lighting. Clear any walking paths of anything that might make someone trip, such as rocks or tools. Regularly check to see if handrails are loose or broken. Make sure that both sides of any steps have handrails. Any raised decks and porches should have guardrails on the edges. Have any leaves, snow, or ice cleared regularly. Use sand or salt on walking paths during winter. Clean up any spills in your garage right away. This includes oil or grease spills. What can I do in the bathroom? Use night lights. Install grab bars by the toilet and in the tub and shower. Do not use towel bars as grab bars. Use non-skid mats or decals in the tub or shower. If you need to sit down in the shower, use a plastic, non-slip stool. Keep the floor dry. Clean up any water that spills on the floor as soon as it happens. Remove soap buildup in the tub or shower regularly. Attach bath mats securely with double-sided non-slip rug tape. Do not have throw rugs and other things on the floor that can make you trip. What can I do in the bedroom? Use night  lights. Make sure that you have a light by your bed that is easy to reach. Do not use any sheets or blankets that are too big for your bed. They should not hang down onto the floor. Have a firm chair that has side arms. You can use this for support while you get dressed. Do not have throw rugs and other things on the floor that can make you trip. What can I do in the kitchen? Clean up any spills right away. Avoid walking on wet floors. Keep items that you use a lot in easy-to-reach places. If you need to reach something above you, use a strong step stool that has a grab bar. Keep electrical cords out of the way. Do not use floor polish or wax that makes floors slippery. If you must use wax, use non-skid floor wax. Do not have throw rugs and other things on the floor that can make you trip. What can I do with my stairs? Do not leave any  items on the stairs. Make sure that there are handrails on both sides of the stairs and use them. Fix handrails that are broken or loose. Make sure that handrails are as long as the stairways. Check any carpeting to make sure that it is firmly attached to the stairs. Fix any carpet that is loose or worn. Avoid having throw rugs at the top or bottom of the stairs. If you do have throw rugs, attach them to the floor with carpet tape. Make sure that you have a light switch at the top of the stairs and the bottom of the stairs. If you do not have them, ask someone to add them for you. What else can I do to help prevent falls? Wear shoes that: Do not have high heels. Have rubber bottoms. Are comfortable and fit you well. Are closed at the toe. Do not wear sandals. If you use a stepladder: Make sure that it is fully opened. Do not climb a closed stepladder. Make sure that both sides of the stepladder are locked into place. Ask someone to hold it for you, if possible. Clearly mark and make sure that you can see: Any grab bars or handrails. First and last steps. Where the edge of each step is. Use tools that help you move around (mobility aids) if they are needed. These include: Canes. Walkers. Scooters. Crutches. Turn on the lights when you go into a dark area. Replace any light bulbs as soon as they burn out. Set up your furniture so you have a clear path. Avoid moving your furniture around. If any of your floors are uneven, fix them. If there are any pets around you, be aware of where they are. Review your medicines with your doctor. Some medicines can make you feel dizzy. This can increase your chance of falling. Ask your doctor what other things that you can do to help prevent falls. This information is not intended to replace advice given to you by your health care provider. Make sure you discuss any questions you have with your health care provider. Document Released: 11/18/2008  Document Revised: 06/30/2015 Document Reviewed: 02/26/2014 Elsevier Interactive Patient Education  2017 [employer].

## 2022-07-17 ENCOUNTER — Ambulatory Visit: Payer: 59 | Attending: Internal Medicine

## 2022-07-17 VITALS — Ht 72.0 in | Wt 172.0 lb

## 2022-07-17 DIAGNOSIS — Z0000 Encounter for general adult medical examination without abnormal findings: Secondary | ICD-10-CM | POA: Diagnosis not present

## 2022-07-19 ENCOUNTER — Other Ambulatory Visit: Payer: Self-pay

## 2022-07-19 ENCOUNTER — Telehealth: Payer: Self-pay | Admitting: Internal Medicine

## 2022-07-19 DIAGNOSIS — E069 Thyroiditis, unspecified: Secondary | ICD-10-CM

## 2022-07-19 MED ORDER — METHIMAZOLE 5 MG PO TABS: 5.0000 mg | ORAL_TABLET | Freq: Every day | ORAL | 3 refills | Status: DC

## 2022-07-19 NOTE — Telephone Encounter (Signed)
Pt is calling in because her medication methimazole (TAPAZOLE) 5 MG tablet [[id number]] says no refills and pt has 6 pills. Per pt [facility] said they can refill it but they need consent from the office. Pt wants to know if the medication can be refilled. Please follow up with pt.

## 2022-07-19 NOTE — Telephone Encounter (Signed)
Call placed to patient advised patient that  [name], Joanna Ping, MD  is the MD that ordered Tapazole . Advised patient to call has him for refills. Patient voiced that she will call his office for refills.

## 2022-07-27 DIAGNOSIS — E064 Drug-induced thyroiditis: Secondary | ICD-10-CM | POA: Diagnosis not present

## 2022-07-27 DIAGNOSIS — J449 Chronic obstructive pulmonary disease, unspecified: Secondary | ICD-10-CM | POA: Diagnosis not present

## 2022-07-27 DIAGNOSIS — I480 Paroxysmal atrial fibrillation: Secondary | ICD-10-CM | POA: Diagnosis not present

## 2022-07-27 DIAGNOSIS — I5033 Acute on chronic diastolic (congestive) heart failure: Secondary | ICD-10-CM | POA: Diagnosis not present

## 2022-07-30 ENCOUNTER — Ambulatory Visit: Payer: 59 | Admitting: Student

## 2022-08-02 ENCOUNTER — Other Ambulatory Visit (HOSPITAL_COMMUNITY): Payer: 59 | Admitting: Internal Medicine

## 2022-08-06 ENCOUNTER — Ambulatory Visit (HOSPITAL_COMMUNITY)
Admission: RE | Admit: 2022-08-06 | Discharge: 2022-08-06 | Disposition: A | Discharge status: Home / Self Care | Payer: 59 | Source: Ambulatory Visit | Attending: Internal Medicine | Admitting: Internal Medicine

## 2022-08-06 DIAGNOSIS — I4819 Other persistent atrial fibrillation: Secondary | ICD-10-CM | POA: Diagnosis not present

## 2022-08-06 LAB — BASIC METABOLIC PANEL
Anion gap: 12 (ref 5–15)
BUN: 53 mg/dL — ABNORMAL HIGH (ref 8–23)
CO2: 12 mmol/L — ABNORMAL LOW (ref 22–32)
Calcium: 9 mg/dL (ref 8.9–10.3)
Chloride: 114 mmol/L — ABNORMAL HIGH (ref 98–111)
Creatinine, Ser: 2.6 mg/dL — ABNORMAL HIGH (ref 0.44–1.00)
GFR, Estimated: 20 mL/min — ABNORMAL LOW (ref >60)
Glucose, Bld: 95 mg/dL (ref 70–99)
Potassium: 4.2 mmol/L (ref 3.5–5.1)
Sodium: 138 mmol/L (ref 135–145)

## 2022-08-07 ENCOUNTER — Telehealth (HOSPITAL_COMMUNITY): Payer: Self-pay | Admitting: *Deleted

## 2022-08-07 MED ORDER — DOFETILIDE 125 MCG PO CAPS: 125.0000 ug | ORAL_CAPSULE | Freq: Two times a day (BID) | ORAL | 2 refills | Status: DC

## 2022-08-07 NOTE — Telephone Encounter (Signed)
Due to new abnormal creatinine level, her CrCl estimate is 28 mL/min. She unfortunately has to have Tikosyn lowered to 125 mcg BID dosage. Please send her new prescription and to stop Tikosyn 500 mcg BID and see PCP for abnormal creatinine. Thank you     Patient notified of recommendations. New RX for dofetilide dose sent to pharmacy. Instructed pt to contact PCP today for further workup regarding kidney function. Pt verbalized agreement.

## 2022-08-08 ENCOUNTER — Telehealth: Payer: Self-pay | Admitting: Internal Medicine

## 2022-08-08 DIAGNOSIS — N184 Chronic kidney disease, stage 4 (severe): Secondary | ICD-10-CM

## 2022-08-08 NOTE — Telephone Encounter (Signed)
PC placed to pt this afternoon.  Advised that I received recent lab results from her cardiologist.  Her GFR has been declining and now sits at 20.  Pt is not on NSAID.  Advised not to take any NSAIDs. Advised patient to hold off on taking Farxiga for now. Message sent to her cardiologist asking about whether the Entresto needs to be changed given her current GFR.  Referral has been submitted for her to see a nephrologist.  All questions were answered.

## 2022-08-13 NOTE — Telephone Encounter (Signed)
Appt sch 7/17

## 2022-08-22 ENCOUNTER — Encounter (HOSPITAL_COMMUNITY): Payer: Self-pay | Admitting: Cardiology

## 2022-08-22 ENCOUNTER — Ambulatory Visit (HOSPITAL_COMMUNITY)
Admission: RE | Admit: 2022-08-22 | Discharge: 2022-08-22 | Disposition: A | Discharge status: Home / Self Care | Payer: 59 | Source: Ambulatory Visit | Attending: Cardiology | Admitting: Cardiology

## 2022-08-22 VITALS — BP 104/70 | HR 66 | Wt 158.2 lb

## 2022-08-22 DIAGNOSIS — Z79899 Other long term (current) drug therapy: Secondary | ICD-10-CM | POA: Insufficient documentation

## 2022-08-22 DIAGNOSIS — M79604 Pain in right leg: Secondary | ICD-10-CM | POA: Insufficient documentation

## 2022-08-22 DIAGNOSIS — M545 Low back pain, unspecified: Secondary | ICD-10-CM | POA: Diagnosis not present

## 2022-08-22 DIAGNOSIS — I4819 Other persistent atrial fibrillation: Secondary | ICD-10-CM | POA: Diagnosis not present

## 2022-08-22 DIAGNOSIS — I5022 Chronic systolic (congestive) heart failure: Secondary | ICD-10-CM

## 2022-08-22 DIAGNOSIS — I110 Hypertensive heart disease with heart failure: Secondary | ICD-10-CM | POA: Diagnosis not present

## 2022-08-22 DIAGNOSIS — Z8249 Family history of ischemic heart disease and other diseases of the circulatory system: Secondary | ICD-10-CM | POA: Insufficient documentation

## 2022-08-22 DIAGNOSIS — I340 Nonrheumatic mitral (valve) insufficiency: Secondary | ICD-10-CM

## 2022-08-22 DIAGNOSIS — F17210 Nicotine dependence, cigarettes, uncomplicated: Secondary | ICD-10-CM | POA: Diagnosis not present

## 2022-08-22 DIAGNOSIS — E0590 Thyrotoxicosis, unspecified without thyrotoxic crisis or storm: Secondary | ICD-10-CM | POA: Insufficient documentation

## 2022-08-22 DIAGNOSIS — I428 Other cardiomyopathies: Secondary | ICD-10-CM | POA: Insufficient documentation

## 2022-08-22 DIAGNOSIS — T148XXA Other injury of unspecified body region, initial encounter: Secondary | ICD-10-CM

## 2022-08-22 LAB — COMPREHENSIVE METABOLIC PANEL
ALT: 7 U/L (ref 0–44)
AST: 9 U/L — ABNORMAL LOW (ref 15–41)
Albumin: 3.6 g/dL (ref 3.5–5.0)
Alkaline Phosphatase: 75 U/L (ref 38–126)
Anion gap: 9 (ref 5–15)
BUN: 20 mg/dL (ref 8–23)
CO2: 20 mmol/L — ABNORMAL LOW (ref 22–32)
Calcium: 9.1 mg/dL (ref 8.9–10.3)
Chloride: 103 mmol/L (ref 98–111)
Creatinine, Ser: 1.33 mg/dL — ABNORMAL HIGH (ref 0.44–1.00)
GFR, Estimated: 45 mL/min — ABNORMAL LOW (ref >60)
Glucose, Bld: 103 mg/dL — ABNORMAL HIGH (ref 70–99)
Potassium: 4.2 mmol/L (ref 3.5–5.1)
Sodium: 132 mmol/L — ABNORMAL LOW (ref 135–145)
Total Bilirubin: 0.6 mg/dL (ref 0.3–1.2)
Total Protein: 7.8 g/dL (ref 6.5–8.1)

## 2022-08-22 LAB — BRAIN NATRIURETIC PEPTIDE: B Natriuretic Peptide: 52.6 pg/mL (ref 0.0–100.0)

## 2022-08-22 LAB — TSH: TSH: 4.916 u[IU]/mL — ABNORMAL HIGH (ref 0.350–4.500)

## 2022-08-22 NOTE — Progress Notes (Signed)
ADVANCED HEART FAILURE CLINIC NOTE  Referring Physician: Marcine Matar, MD  Primary Care: Marcine Matar, MD Primary Cardiologist: Kristeen Miss  HPI: Joanna [name] is a 62 y.o. female with hypertension, long-term persistent atrial fibrillation and nonischemic cardiomyopathy with EF as low as 20 to 25% presenting today to establish care. Ms. Feigel reports being diagnosed with heart failure for at least 10 years now with cath in 2007 demonstrating normal coronaries. She has had several admissions at [facility] dating back to 2020 when she required intubation for hypertensive urgency associated with pulmonary edema and atrial fibrillation. 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 During that admission, TTE w/ LVEF 25%-30% & severely dilated LA w/ severe eccentric MR. Since that time she has been seen in our [facility] clinic with addition of some GDMT.   Interval hx:  -She states over the past month she has had significant difficulty with ambulation due to pain in the right lower extremity. She believes these problems all started after taking amiodarone.  -Her breathing has improved significantly since improvement in her MR and escalation of GDMT.   Activity level/exercise tolerance:  NYHA IIB Orthopnea:  Sleeps on 2- Paroxysmal noctural dyspnea: No Chest pain/pressure:  No Orthostatic lightheadedness:  No Palpitations: No Lower extremity edema:  Minimal Presyncope/syncope:  No Cough:  No  Past Medical History:  Diagnosis Date   Atrial fibrillation with RVR (HCC)    Chronic combined systolic (congestive) and diastolic (congestive) heart failure (HCC)    Dyspnea    Goiter    Hypertension    NICM (nonischemic cardiomyopathy) (HCC)    a. 08/2005 Echo: EF 30-35%, mod diff HK. Mild to Mod MR. Mildly dil LA; b. 08/2005 Cath: Nl Cors. Elevated CO w/o shunt;  c. 09/2007 Echo: EF 45%. Mild to mod MR; c. 03/2015 Echo: EF 45%, global HK. Gr1 DD. Mild MR. Mildly dil LA.    Current Outpatient Medications  Medication Sig Dispense Refill   acetaminophen (TYLENOL) 500 MG tablet Take 500-1,000 mg by mouth every 6 (six) hours as needed (pain.).     carvedilol (COREG) 3.125 MG tablet Take 1 tablet (3.125 mg total) by mouth 2 (two) times daily with a meal. 180 tablet 3   dofetilide (TIKOSYN) 125 MCG capsule Take 1 capsule (125 mcg total) by mouth 2 (two) times daily. 60 capsule 2   ELIQUIS 5 MG TABS tablet TAKE 1 TABLET BY MOUTH TWICE  DAILY 200 tablet 2   ENTRESTO 97-103 MG TAKE 1 TABLET BY MOUTH TWICE  DAILY 200 tablet 2   furosemide (LASIX) 40 MG tablet Take 1 tablet (40 mg total) by mouth daily. 90 tablet 3   methimazole (TAPAZOLE) 5 MG tablet Take 1 tablet (5 mg total) by mouth daily. 90 tablet 3   potassium chloride (KLOR-CON) 10 MEQ tablet Take 1 tablet (10 mEq total) by mouth daily. 90 tablet 3   spironolactone (ALDACTONE) 25 MG tablet TAKE 1/2 TABLET(12.5 MG) BY MOUTH DAILY 45 tablet 3   dapagliflozin propanediol (FARXIGA) 10 MG TABS tablet Take 1 tablet (10 mg total) by mouth daily. (Patient not taking: Reported on 08/22/2022) 90 tablet 11   No current facility-administered medications for this encounter.    No Known Allergies    Social History   Socioeconomic History   Marital status: Single    Spouse name: Not on file   Number of children:  1   Years of education: Not on file   Highest education level: High school graduate  Occupational History   Occupation: unemployed    Comment: on disablity  Tobacco Use   Smoking status: Every Day    Current packs/day: 0.00    Average packs/day: 2.0 packs/day for 30.0 years (60.0 ttl pk-yrs)    Types: Cigarettes    Start date: 02/09/1988    Last attempt to quit: 02/08/2018    Years since quitting: 4.5   Smokeless tobacco: Never   Tobacco comments:    3 cigarettes daily 02/09/22  Vaping Use   Vaping  status: Never Used  Substance and Sexual Activity   Alcohol use: No    Comment: quit 2 years ago   Drug use: No   Sexual activity: Not on file  Other Topics Concern   Not on file  Social History Narrative   Not on file   Social Determinants of Health   Financial Resource Strain: Low Risk  (07/17/2022)   Overall Financial Resource Strain (CARDIA)    Difficulty of Paying Living Expenses: Not hard at all  Food Insecurity: No Food Insecurity (07/17/2022)   Hunger Vital Sign    Worried About Running Out of Food in the Last Year: Never true    Ran Out of Food in the Last Year: Never true  Transportation Needs: No Transportation Needs (07/17/2022)   PRAPARE - Administrator, Civil Service (Medical): No    Lack of Transportation (Non-Medical): No  Physical Activity: Insufficiently Active (07/17/2022)   Exercise Vital Sign    Days of Exercise per Week: 3 days    Minutes of Exercise per Session: 30 min  Stress: No Stress Concern Present (07/17/2022)   [employer] of Occupational Health - Occupational Stress Questionnaire    Feeling of Stress : Not at all  Social Connections: Moderately Isolated (07/17/2022)   Social Connection and Isolation Panel [NHANES]    Frequency of Communication with Friends and Family: More than three times a week    Frequency of Social Gatherings with Friends and Family: Three times a week    Attends Religious Services: More than 4 times per year    Active Member of Clubs or Organizations: No    Attends Banker Meetings: Never    Marital Status: Never married  Intimate Partner Violence: Not At Risk (07/17/2022)   Humiliation, Afraid, Rape, and Kick questionnaire    Fear of Current or Ex-Partner: No    Emotionally Abused: No    Physically Abused: No    Sexually Abused: No      Family History  Problem Relation Age of Onset   Cancer Mother    Heart disease Father     PHYSICAL EXAM: Vitals:   08/22/22 0914  BP: 104/70  Pulse:  66  SpO2: 100%   GENERAL: Well nourished, well developed, and in no apparent distress at rest.  HEENT: Negative for arcus senilis or xanthelasma. There is no scleral icterus.  The mucous membranes are pink and moist.   NECK: Supple, No masses. Normal carotid upstrokes without bruits. No masses or thyromegaly.    CHEST: There are no chest wall deformities. There is no chest wall tenderness. Respirations are unlabored.  Lungs- CTA B/L CARDIAC:  JVP: 7 cm          Normal rate with regular rhythm. No murmurs, rubs or gallops.  Pulses are 2+ and symmetrical in upper and lower extremities. No edema.  ABDOMEN: Soft, non-tender, non-distended. There are no masses or hepatomegaly. There are normal bowel sounds.  EXTREMITIES: Warm and well perfused with no cyanosis, clubbing.  LYMPHATIC: No axillary or supraclavicular lymphadenopathy.  NEUROLOGIC: Patient is oriented x3 with no focal or lateralizing neurologic deficits.  PSYCH: Patients affect is appropriate, there is no evidence of anxiety or depression.  SKIN: Warm and dry; no lesions or wounds.    DATA REVIEW  WJX:BJYNWG fibrillation 02/12/21: Normal sinus rhythm  ECHO: 12/21/21: LVEF 25% to 30%. Severe eccentric MR.  10/04/20: LVEF 55% - 60% TEE: 02/26/22: LVEF 35%-40%, mild-mod MR.   ASSESSMENT & PLAN:  NYHA IIB, Stage C-D, Systolic Heart Failure Etiology of NF:AOZHYQ nonischemic from persistent atrial fibrillation NYHA class / AHA Stage:IIB Volume status & Diuretics: Euvolemic, continue lasix 40mg  daily Vasodilators:Entresto 97/103mg  BID Beta-Blocker: Increase Coreg to 6.25 mg twice daily.  Previously decreased due to bradycardia. MVH:[id number] 12.5mg  daily Cardiometabolic:start farxiga 10mg  daily Devices therapies & Valvulopathies: Followed with EP, now on dofetilide for rhythm control.  Improvement in MR after rhythm control. Repeat TTE at follow up. Advanced therapies:N/A  2. Severe mitral regurgitation  - Severe LA  enlargement, likely atrial functional MR - Repeat TEE with improvement in MR after maintaining NSR; now mild-moderate MR  3. Persistent atrial fibrillation  -Now on Tikosyn, followed by Dr. Elberta Fortis.  In normal sinus rhythm at this time.    4. Amiodarone induced thyrotoxicosis - Follow with endocrinology  - Off amiodarone since 10/16/21 - s/p methimazole & prednisone  5. Severe low back pain / right leg pain  - Reports that she is unable to walk > 45ft due to 10/10pain sharp pain radiating down the RLE.  -Will refer to orthopedic/neurosurgery for further evaluation.  Would likely require MRI back.  Ambyr Qadri Advanced Heart Failure Mechanical Circulatory Support

## 2022-08-22 NOTE — Patient Instructions (Signed)
There has been no changes to your medications.  Labs done today, your results will be available in MyChart, we will contact you for abnormal readings.  You have been referred to Neurology. They will call you to arrange your appointment.  Your physician recommends that you schedule a follow-up appointment in: 4 months ( November) ** please call the office in September to arrange your follow up appointment. **  If you have any questions or concerns before your next appointment please send [place] a message through [place] or call our office at [phone].    TO LEAVE A MESSAGE FOR THE NURSE SELECT OPTION 2, PLEASE LEAVE A MESSAGE INCLUDING: YOUR NAME DATE OF BIRTH CALL BACK NUMBER REASON FOR CALL**this is important as we prioritize the call backs  YOU WILL RECEIVE A CALL BACK THE SAME DAY AS LONG AS YOU CALL BEFORE 4:00 PM  At the [facility], you and your health needs are our priority. As part of our continuing mission to provide you with exceptional heart care, we have created designated Provider Care Teams. These Care Teams include your primary Cardiologist (physician) and Advanced Practice Providers (APPs- Physician Assistants and Nurse Practitioners) who all work together to provide you with the care you need, when you need it.   You may see any of the following providers on your designated Care Team at your next follow up: Dr Arvilla Meres Dr Marca Ancona Dr. Marcos Eke, NP Robbie Lis, [place] [facility] [place], [place] Brynda Peon, NP Karle Plumber, PharmD   Please be sure to bring in all your medications bottles to every appointment.    Thank you for choosing [facility]

## 2022-08-23 ENCOUNTER — Other Ambulatory Visit (HOSPITAL_COMMUNITY): Payer: Self-pay

## 2022-08-23 DIAGNOSIS — T148XXA Other injury of unspecified body region, initial encounter: Secondary | ICD-10-CM

## 2022-08-26 DIAGNOSIS — I480 Paroxysmal atrial fibrillation: Secondary | ICD-10-CM | POA: Diagnosis not present

## 2022-08-26 DIAGNOSIS — E064 Drug-induced thyroiditis: Secondary | ICD-10-CM | POA: Diagnosis not present

## 2022-08-26 DIAGNOSIS — J449 Chronic obstructive pulmonary disease, unspecified: Secondary | ICD-10-CM | POA: Diagnosis not present

## 2022-08-26 DIAGNOSIS — I5033 Acute on chronic diastolic (congestive) heart failure: Secondary | ICD-10-CM | POA: Diagnosis not present

## 2022-08-27 ENCOUNTER — Ambulatory Visit (INDEPENDENT_AMBULATORY_CARE_PROVIDER_SITE_OTHER): Payer: 59 | Admitting: Physical Medicine and Rehabilitation

## 2022-08-27 ENCOUNTER — Other Ambulatory Visit (INDEPENDENT_AMBULATORY_CARE_PROVIDER_SITE_OTHER): Payer: 59

## 2022-08-27 DIAGNOSIS — M47816 Spondylosis without myelopathy or radiculopathy, lumbar region: Secondary | ICD-10-CM | POA: Diagnosis not present

## 2022-08-27 DIAGNOSIS — M1611 Unilateral primary osteoarthritis, right hip: Secondary | ICD-10-CM | POA: Diagnosis not present

## 2022-08-27 DIAGNOSIS — M5136 Other intervertebral disc degeneration, lumbar region: Secondary | ICD-10-CM

## 2022-08-27 DIAGNOSIS — M5416 Radiculopathy, lumbar region: Secondary | ICD-10-CM

## 2022-08-27 NOTE — Progress Notes (Signed)
Joanna [name] - 62 y.o. female MRN [id number]  Date of birth: [birth year]  Office Visit Note: Visit Date: 08/27/2022 PCP: Marcine Matar, MD Referred by: Joanna Nettles, DO  Subjective: Chief Complaint  Patient presents with   Lower Back - Pain   HPI: Joanna [name] is a 62 y.o. female who comes in today per the request of Dr. Dorthula [name] for evaluation of chronic, worsening and severe right sided lower back pain radiating to buttock and down lateral thigh to knee. Patients sister accompanying her during our visit today. Pain ongoing for several months, feels her pain started after she began taking Amiodarone. Pain worsens with movement and walking, describes as sore and aching, currently rates as 8 out of 10. She states right leg feels very weak. Some relief of pain with home exercise regimen, rest and use of medications. Some relief of Tylenol and BC Powder. No prior imaging of lumbar spine. Patient ambulates with cane, sister reports she is very unsteady on her feet. Patient denies focal weakness, numbness and tingling. No recent trauma or falls.    Review of Systems  Musculoskeletal:  Positive for back pain.  Neurological:  Negative for tingling, sensory change, focal weakness and weakness.  All other systems reviewed and are negative.  Otherwise per HPI.  Assessment & Plan: Visit Diagnoses:    ICD-10-CM   1. Lumbar radiculopathy  M54.16 XR Lumbar Spine 2-3 Views    MR LUMBAR SPINE WO CONTRAST    2. Other intervertebral disc degeneration, lumbar region  M51.36 MR LUMBAR SPINE WO CONTRAST    3. Facet arthropathy, lumbar  M47.816 MR LUMBAR SPINE WO CONTRAST    4. Unilateral primary osteoarthritis, right hip  M16.11 MR LUMBAR SPINE WO CONTRAST       Plan: Findings:  Chronic, worsening and severe right sided lower back pain radiating to buttock and down right lateral thigh to knee. Patient continues to have severe pain despite good conservative therapies such as  home exercise regimen, rest and use of medications. Patients clinical presentation and exam are consistent with L5 nerve pattern. I obtained 2 view lumbar spine x-ray today that exhibits multi level disc height loss and spurring, severe osteoarthritis noted to right hip. Suprisingly, no pain to groin with rotation of right hip on exam today. Patient is extremely stiff, exam difficult to perform due to right sided lower extremity pain. Next step is to obtain lumbar MRI imaging. Depending on results of imaging we would consider performing lumbar epidural steroid injection. Could also look at diagnostic right hip injection in the future. Patient to follow up for lumbar MRI review. No red flag symptoms noted upon exam today.     Meds & Orders: No orders of the defined types were placed in this encounter.   Orders Placed This Encounter  Procedures   XR Lumbar Spine 2-3 Views   MR LUMBAR SPINE WO CONTRAST    Follow-up: Return for lumbar MRI review .   Procedures: No procedures performed      Clinical History: No specialty comments available.   She reports that she has been smoking cigarettes. She started smoking about 34 years ago. She has a 60 pack-year smoking history. She has never used smokeless tobacco. No results for input(s): "HGBA1C", "LABURIC" in the last 8760 hours.  Objective:  VS:  HT:    WT:   BMI:     BP:   HR: bpm  TEMP: ( )  RESP:  Physical Exam Vitals and nursing  note reviewed.  HENT:     Head: Normocephalic and atraumatic.     Right Ear: External ear normal.     Left Ear: External ear normal.     Nose: Nose normal.     Mouth/Throat:     Mouth: Mucous membranes are moist.  Eyes:     Extraocular Movements: Extraocular movements intact.  Cardiovascular:     Rate and Rhythm: Normal rate.     Pulses: Normal pulses.  Pulmonary:     Effort: Pulmonary effort is normal.  Abdominal:     General: Abdomen is flat. There is no distension.  Musculoskeletal:         General: Tenderness present.     Cervical back: Normal range of motion.     Comments: Patient is slow to rise from seated to standing position. Good lumbar range of motion. No pain noted with facet loading. 5/5 strength noted with bilateral hip flexion, knee flexion/extension, ankle dorsiflexion/plantarflexion and EHL. No clonus noted bilaterally. No pain upon palpation of greater trochanters. No pain with internal/external rotation of bilateral hips. Sensation intact bilaterally. Negative slump test bilaterally. Ambulates with cane, gait slow and unsteady. Of note, severe pain note to right lower extremity during exam today.   Skin:    General: Skin is warm and dry.     Capillary Refill: Capillary refill takes less than 2 seconds.  Neurological:     Mental Status: She is alert and oriented to person, place, and time.     Gait: Gait abnormal.  Psychiatric:        Mood and Affect: Mood normal.        Behavior: Behavior normal.     Ortho Exam  Imaging: XR Lumbar Spine 2-3 Views  Result Date: 08/27/2022 AP and lateral x-rays of lumbar spine exhibit normal anatomical alignment, no spondylolisthesis. Multi level degenerative disc height loss and spurring. Severe degenerative changes noted to right hip. No fractures or dislocations.    Past Medical/Family/Surgical/Social History: Medications & Allergies reviewed per EMR, new medications updated. Patient Active Problem List   Diagnosis Date Noted   Nerve damage 08/22/2022   Encounter for monitoring dofetilide therapy 07/16/2022   Persistent atrial fibrillation (HCC) 01/16/2022   Hypercoagulable state due to persistent atrial fibrillation (HCC) 01/16/2022   Severe mitral regurgitation 01/15/2022   Chronic systolic heart failure (HCC) 01/15/2022   Amiodarone-induced thyroiditis 12/22/2021   Chronic combined systolic and diastolic CHF (congestive heart failure) (HCC) 09/07/2019   Former smoker 03/17/2018   Essential hypertension 03/17/2018    Atrial fibrillation with RVR (HCC) 03/08/2018   Accelerated hypertension 03/08/2018   Hyperglycemia 03/08/2018   Acute systolic (congestive) heart failure (HCC)    Hypertensive urgency 04/11/2015   COPD (chronic obstructive pulmonary disease) (HCC) 04/01/2015   Past Medical History:  Diagnosis Date   Atrial fibrillation with RVR (HCC)    Chronic combined systolic (congestive) and diastolic (congestive) heart failure (HCC)    Dyspnea    Goiter    Hypertension    NICM (nonischemic cardiomyopathy) (HCC)    a. 08/2005 Echo: EF 30-35%, mod diff HK. Mild to Mod MR. Mildly dil LA; b. 08/2005 Cath: Nl Cors. Elevated CO w/o shunt; c. 09/2007 Echo: EF 45%. Mild to mod MR; c. 03/2015 Echo: EF 45%, global HK. Gr1 DD. Mild MR. Mildly dil LA.   Family History  Problem Relation Age of Onset   Cancer Mother    Heart disease Father    Past Surgical History:  Procedure Laterality Date  APPENDECTOMY     BACK SURGERY     CARDIOVERSION N/A 03/14/2018   Procedure: CARDIOVERSION;  Surgeon: Pricilla Riffle, MD;  Location: [facility] ENDOSCOPY;  Service: Cardiovascular;  Laterality: N/A;   TEE WITHOUT CARDIOVERSION N/A 03/14/2018   Procedure: TRANSESOPHAGEAL ECHOCARDIOGRAM (TEE);  Surgeon: Pricilla Riffle, MD;  Location[facility] ENDOSCOPY;  Service: Cardiovascular;  Laterality: N/A;   TEE WITHOUT CARDIOVERSION N/A 02/26/2022   Procedure: TRANSESOPHAGEAL ECHOCARDIOGRAM (TEE);  Surgeon: Joanna Nettles, DO;  Location: [facility] ENDOSCOPY;  Service: Cardiovascular;  Laterality: N/A;   Social History   Occupational History   Occupation: unemployed    Comment: on disablity  Tobacco Use   Smoking status: Every Day    Current packs/day: 0.00    Average packs/day: 2.0 packs/day for 30.0 years (60.0 ttl pk-yrs)    Types: Cigarettes    Start date: 02/09/1988    Last attempt to quit: 02/08/2018    Years since quitting: 4.5   Smokeless tobacco: Never   Tobacco comments:    3 cigarettes daily 02/09/22  Vaping Use   Vaping status: Never  Used  Substance and Sexual Activity   Alcohol use: No    Comment: quit 2 years ago   Drug use: No   Sexual activity: Not on file

## 2022-08-27 NOTE — Progress Notes (Signed)
Functional Pain Scale - descriptive words and definitions  Unmanageable (7)  Pain interferes with normal ADL's/nothing seems to help/sleep is very difficult/active distractions are very difficult to concentrate on. Severe range order  Average Pain 10  Lower back pain on right side that radiates into the right leg

## 2022-09-06 ENCOUNTER — Ambulatory Visit: Payer: 59 | Admitting: Physician Assistant

## 2022-09-10 ENCOUNTER — Other Ambulatory Visit: Payer: 59

## 2022-09-16 ENCOUNTER — Other Ambulatory Visit: Payer: Self-pay | Admitting: Cardiovascular Disease

## 2022-09-18 ENCOUNTER — Ambulatory Visit: Payer: 59 | Admitting: Physician Assistant

## 2022-09-18 ENCOUNTER — Ambulatory Visit: Payer: 59 | Admitting: Internal Medicine

## 2022-09-18 ENCOUNTER — Ambulatory Visit: Payer: Self-pay

## 2022-09-18 DIAGNOSIS — M5416 Radiculopathy, lumbar region: Secondary | ICD-10-CM

## 2022-09-18 DIAGNOSIS — M25551 Pain in right hip: Secondary | ICD-10-CM

## 2022-09-18 DIAGNOSIS — G8929 Other chronic pain: Secondary | ICD-10-CM

## 2022-09-18 MED ORDER — LIDOCAINE 5 % EX PTCH
1.0000 | MEDICATED_PATCH | CUTANEOUS | 0 refills | Status: DC
Start: 2022-09-18 — End: 2022-11-13

## 2022-09-18 NOTE — Telephone Encounter (Signed)
Patient agreeable  and appreciative to a VV with [facility]. Aware that they will call for appt.

## 2022-09-18 NOTE — Telephone Encounter (Signed)
This is a CHF pt 

## 2022-09-18 NOTE — Progress Notes (Unsigned)
   Established Patient Office Visit  Subjective   Patient ID: Joanna [name], female    DOB: 22-Jul-1960  Age: 61 y.o. MRN: [id number]  No chief complaint on file.   States that     {History (Optional):23778}  ROS    Objective:     LMP 06/10/2011  {Vitals History (Optional):23777}  Physical Exam   No results found for any visits on 09/18/22.  {Labs (Optional):23779}  The 10-year ASCVD risk score (Arnett DK, et al., 2019) is: 9.3%    Assessment & Plan:   Problem List Items Addressed This Visit   None   No follow-ups on file.    Kasandra Knudsen Mayers, PA-C

## 2022-09-18 NOTE — Telephone Encounter (Signed)
Message from [place] T sent at 09/18/2022  8:05 AM EDT  Summary: medication request   Patient called stated she has arthritis in her back and right hip and need a pain medication but nothing too strong. Please f/u with patient         Chief Complaint: back and right leg pain  Symptoms: severe pain to back when walking  Frequency: chronic  Pertinent Negatives: Patient denies weakness, numbness or problems with bowel and bladder Disposition: [] ED /[] Urgent Care (no appt availability in office) / [] Appointment(In office/virtual)/ []  [place] [facility]/ [] Home Care/ [x] Refused Recommended Disposition /[]  Mobile Bus/ []  Follow-up with PCP Additional Notes: pt has no transportation has appt in October but stated her sister usually takes her to appts. And she cannot get in contact with her. Advised pt to call back when sister is available to check if any appts. Advised if no appts to go to UC. Pt verbalized understanding.  Reason for Disposition  [1] SEVERE back pain (e.g., excruciating, unable to do any normal activities) AND [2] not improved 2 hours after pain medicine  Answer Assessment - Initial Assessment Questions 1. ONSET: "When did the pain begin?"      chronic 2. LOCATION: "Where does it hurt?" (upper, mid or lower back)     Lower back and right leg  3. SEVERITY: "How bad is the pain?"  (e.g., Scale 1-10; mild, moderate, or severe)   - MILD (1-3): Doesn't interfere with normal activities.    - MODERATE (4-7): Interferes with normal activities or awakens from sleep.    - SEVERE (8-10): Excruciating pain, unable to do any normal activities.      severe 4. PATTERN: "Is the pain constant?" (e.g., yes, no; constant, intermittent)      Comes and goes  5. RADIATION: "Does the pain shoot into your legs or somewhere else?"     Right leg 6. CAUSE:  "What do you think is causing the back pain?"      arthritis 7. BACK OVERUSE:  "Any recent lifting of heavy objects, strenuous  work or exercise?"     *No Answer* 9. NEUROLOGIC SYMPTOMS: "Do you have any weakness, numbness, or problems with bowel/bladder control?"     *No Answer* 10. OTHER SYMPTOMS: "Do you have any other symptoms?" (e.g., fever, abdomen pain, burning with urination, blood in urine)       No  Protocols used: Back Pain-A-AH

## 2022-09-19 ENCOUNTER — Encounter: Payer: Self-pay | Admitting: Physician Assistant

## 2022-09-21 ENCOUNTER — Other Ambulatory Visit: Payer: 59

## 2022-09-26 DIAGNOSIS — J449 Chronic obstructive pulmonary disease, unspecified: Secondary | ICD-10-CM | POA: Diagnosis not present

## 2022-09-26 DIAGNOSIS — E064 Drug-induced thyroiditis: Secondary | ICD-10-CM | POA: Diagnosis not present

## 2022-09-26 DIAGNOSIS — I480 Paroxysmal atrial fibrillation: Secondary | ICD-10-CM | POA: Diagnosis not present

## 2022-09-26 DIAGNOSIS — I5033 Acute on chronic diastolic (congestive) heart failure: Secondary | ICD-10-CM | POA: Diagnosis not present

## 2022-09-27 ENCOUNTER — Other Ambulatory Visit (HOSPITAL_COMMUNITY): Payer: Self-pay | Admitting: Cardiology

## 2022-10-01 ENCOUNTER — Ambulatory Visit
Admission: RE | Admit: 2022-10-01 | Discharge: 2022-10-01 | Disposition: A | Discharge status: Home / Self Care | Payer: 59 | Source: Ambulatory Visit | Attending: Physical Medicine and Rehabilitation | Admitting: Physical Medicine and Rehabilitation

## 2022-10-01 DIAGNOSIS — M1611 Unilateral primary osteoarthritis, right hip: Secondary | ICD-10-CM

## 2022-10-01 DIAGNOSIS — M48061 Spinal stenosis, lumbar region without neurogenic claudication: Secondary | ICD-10-CM | POA: Diagnosis not present

## 2022-10-01 DIAGNOSIS — M5416 Radiculopathy, lumbar region: Secondary | ICD-10-CM

## 2022-10-01 DIAGNOSIS — M5136 Other intervertebral disc degeneration, lumbar region: Secondary | ICD-10-CM

## 2022-10-01 DIAGNOSIS — M47816 Spondylosis without myelopathy or radiculopathy, lumbar region: Secondary | ICD-10-CM

## 2022-10-01 NOTE — Progress Notes (Deleted)
Cardiology Office Note Date:  10/01/2022  Patient ID:  Joanna [name], Joanna [name] 06-29-1960, MRN [id number] PCP:  Marcine Matar, MD  Cardiologist:  Dr. Elease Hashimoto Electrophysiologist: Dr. Elberta Fortis     Chief Complaint: *** Joice Lofts visit  History of Present Illness: Joanna [name] is a 62 y.o. female with history of HTN, NICM, chronic CHF (systolic), VHD (severe MR, functional) AFib.  Admitted for Tikosyn initiation Dec 2023, during which she converted with drug, did have a 4.2second post termination pause and her dig stopped, coreg reduced. She saw the Afib clinic as usual in f/u, maintaining SR  Missed/cancelled her 1 mo f/u  She saw HF team 02/12/22, maintaining SR, planned for TEE/LHC to start eval for possible MV intervention/TEER  TEE LVEF 40-45%mild MR (No cath) No longer felt need for structural heart tea,/MV intervention  I saw her 04/17/22 Cardiac-wise she has no complaints or concerns, says her heart has been "great". She denies any CP, palpitations or cardiac awareness No SOB No near syncope or syncope She is in a lot of pain with R wrist and L hip/buttock pain, now for weeks, saw her PMD a bit ago without much help she reports. She has had intermittent flare ups of this pain over the years. I have advised her to reach out to her PMD to re-evaluate and manage this She reports excellent medication compliance. Maintaining SR by symptoms Stable QTc No changes  Saw the AFib clinic June 2024, stable EKG, no changes made Saw Dr. Sofie Hartigan 08/22/22, primary c/o was of back/RLE pain limiting her ability to walk, that she connected to the amiodarone, otherwise breathing easier. Referred to orthopedic/neurosurgery for her back/RLE pain  *** Tikosyn EKG, meds, labs *** eliquis, bleeding, dose, labs *** burden *** ? Back *** volume  AFib/AAD hx Diagnosed 2020 Hx of amiodarone started 2020 stopped 2/2 thyrotoxicosis Sept 2023 Tikosyn started Dec 2023   Past Medical  History:  Diagnosis Date   Atrial fibrillation with RVR (HCC)    Chronic combined systolic (congestive) and diastolic (congestive) heart failure (HCC)    Dyspnea    Goiter    Hypertension    NICM (nonischemic cardiomyopathy) (HCC)    a. 08/2005 Echo: EF 30-35%, mod diff HK. Mild to Mod MR. Mildly dil LA; b. 08/2005 Cath: Nl Cors. Elevated CO w/o shunt; c. 09/2007 Echo: EF 45%. Mild to mod MR; c. 03/2015 Echo: EF 45%, global HK. Gr1 DD. Mild MR. Mildly dil LA.    Past Surgical History:  Procedure Laterality Date   APPENDECTOMY     BACK SURGERY     CARDIOVERSION N/A 03/14/2018   Procedure: CARDIOVERSION;  Surgeon: Pricilla Riffle, MD;  Location: [facility] ENDOSCOPY;  Service: Cardiovascular;  Laterality: N/A;   TEE WITHOUT CARDIOVERSION N/A 03/14/2018   Procedure: TRANSESOPHAGEAL ECHOCARDIOGRAM (TEE);  Surgeon: Pricilla Riffle, MD;  Location: [facility] ENDOSCOPY;  Service: Cardiovascular;  Laterality: N/A;   TEE WITHOUT CARDIOVERSION N/A 02/26/2022   Procedure: TRANSESOPHAGEAL ECHOCARDIOGRAM (TEE);  Surgeon: Dorthula Nettles, DO;  Location: [facility] ENDOSCOPY;  Service: Cardiovascular;  Laterality: N/A;    Current Outpatient Medications  Medication Sig Dispense Refill   acetaminophen (TYLENOL) 500 MG tablet Take 500-1,000 mg by mouth every 6 (six) hours as needed (pain.).     carvedilol (COREG) 3.125 MG tablet Take 1 tablet (3.125 mg total) by mouth 2 (two) times daily with a meal. 180 tablet 3   dapagliflozin propanediol (FARXIGA) 10 MG TABS tablet Take 1 tablet (10 mg total) by mouth  daily. (Patient not taking: Reported on 08/22/2022) 90 tablet 11   dofetilide (TIKOSYN) 125 MCG capsule Take 1 capsule (125 mcg total) by mouth 2 (two) times daily. 60 capsule 2   ELIQUIS 5 MG TABS tablet TAKE 1 TABLET BY MOUTH TWICE  DAILY 200 tablet 2   furosemide (LASIX) 40 MG tablet TAKE 1 TABLET BY MOUTH DAILY 90 tablet 2   lidocaine (LIDODERM) 5 % Place 1 patch onto the skin daily. Remove & Discard patch within 12 hours or as  directed by MD 30 patch 0   methimazole (TAPAZOLE) 5 MG tablet Take 1 tablet (5 mg total) by mouth daily. 90 tablet 3   potassium chloride (KLOR-CON) 10 MEQ tablet Take 1 tablet (10 mEq total) by mouth daily. 90 tablet 3   sacubitril-valsartan (ENTRESTO) 97-103 MG TAKE 1 TABLET BY MOUTH TWICE  DAILY 200 tablet 3   spironolactone (ALDACTONE) 25 MG tablet TAKE 1/2 TABLET(12.5 MG) BY MOUTH DAILY 45 tablet 3   No current facility-administered medications for this visit.    Allergies:   Patient has no known allergies.   Social History:  The patient  reports that she has been smoking cigarettes. She started smoking about 34 years ago. She has a 60 pack-year smoking history. She has never used smokeless tobacco. She reports that she does not drink alcohol and does not use drugs.   Family History:  The patient's family history includes Cancer in her mother; Heart disease in her father.  ROS:  Please see the history of present illness.    All other systems are reviewed and otherwise negative.   PHYSICAL EXAM:  VS:  LMP 06/10/2011  BMI: There is no height or weight on file to calculate BMI. Well nourished, well developed, in no acute distress HEENT: normocephalic, atraumatic Neck: no JVD, carotid bruits or masses Cardiac: *** RRR; no significant murmurs, no rubs, or gallops Lungs: *** CTA b/l, no wheezing, rhonchi or rales Abd: soft, nontender MS: no deformity or atrophy Ext: *** no edema Skin: warm and dry, no rash Neuro:  No gross deficits appreciated Psych: euthymic mood, full affect   EKG:  Done today and reviewed by myself shows  ***  02/26/22: TEE LV: Mildly reduced LVEF, 40-45% RV: Normal size and function Mitral valve: Mild central MR.  Tricuspid valve: trivial TR Pulmonic valve: trivial PI LA: mildly dilated RA: normal size.   12/21/21: TTE 1. No significant change from echo done in 2020.   2. Left ventricular ejection fraction, by estimation, is 25 to 30%. The  left  ventricle has severely decreased function. The left ventricle  demonstrates global hypokinesis. The left ventricular internal cavity size  was moderately to severely dilated.  There is mild left ventricular hypertrophy. Left ventricular diastolic  parameters are indeterminate.   3. Right ventricular systolic function is low normal. The right  ventricular size is normal. There is moderately elevated pulmonary artery  systolic pressure.   4. Left atrial size was severely dilated.   5. MR is eccentric, posteriorly directed into LA . The mitral valve is  abnormal. Severe mitral valve regurgitation.   6. Tricuspid valve regurgitation is mild to moderate.   7. The aortic valve is tricuspid. Aortic valve regurgitation is trivial.  Aortic valve sclerosis is present, with no evidence of aortic valve  stenosis.   8. The inferior vena cava is dilated in size with <50% respiratory  variability, suggesting right atrial pressure of 15 mmHg.    10/04/2020: TTE 1.  Left ventricular ejection fraction, by estimation, is 55 to 60%. The  left ventricle has normal function. The left ventricle has no regional  wall motion abnormalities. There is moderate left ventricular hypertrophy.  Left ventricular diastolic  parameters are indeterminate.   2. Right ventricular systolic function is normal. The right ventricular  size is normal.   3. Left atrial size was severely dilated.   4. The mitral valve is grossly normal. Moderate mitral valve  regurgitation.   5. The aortic valve is normal in structure. Aortic valve regurgitation is  not visualized. No aortic stenosis is present.    Recent Labs: 02/26/2022: Hemoglobin 14.8; Platelets 386 07/16/2022: Magnesium 2.5 08/22/2022: ALT 7; B Natriuretic Peptide 52.6; BUN 20; Creatinine, Ser 1.33; Potassium 4.2; Sodium 132; TSH 4.916  No results found for requested labs within last 365 days.   CrCl cannot be calculated (Patient's most recent lab result is older than  the maximum 21 days allowed.).   Wt Readings from Last 3 Encounters:  08/22/22 158 lb 3.2 oz (71.8 kg)  07/17/22 172 lb (78 kg)  07/16/22 169 lb (76.7 kg)     Other studies reviewed: Additional studies/records reviewed today include: summarized above  ASSESSMENT AND PLAN:  Persistent AFib CHA2DS2Vasc is 3, on eliquis, *** appropriately dosed Tikosyn, with *** stable QTc *** Maintaining SR by her report  *** Tikosyn teaching re-enforced today *** Labs today  NICM Suspect tachy-mediated Chronic CHF MR Felt to be functional LVEF and MR are improved with SR *** No symptoms/exam findings of volume OL today  HTN *** Looks OK  6.  Secondary hypercoagulable state   Disposition: ***   Current medicines are reviewed at length with the patient today.  The patient did not have any concerns regarding medicines.  Norma Fredrickson, PA-C 10/01/2022 2:16 PM     [facility] [address] [place] [address] [phone] (office)  [phone] (fax)

## 2022-10-02 ENCOUNTER — Other Ambulatory Visit: Payer: 59

## 2022-10-03 ENCOUNTER — Ambulatory Visit: Payer: 59 | Admitting: Physician Assistant

## 2022-10-16 ENCOUNTER — Ambulatory Visit (HOSPITAL_COMMUNITY): Payer: 59 | Admitting: Internal Medicine

## 2022-10-23 ENCOUNTER — Telehealth: Payer: Self-pay

## 2022-10-23 NOTE — Telephone Encounter (Signed)
Spoke with patient and scheduled OV for 10/26/22.

## 2022-10-23 NOTE — Telephone Encounter (Signed)
-----   Message from [place], [place] E sent at 10/18/2022  2:06 PM EDT ----- Can we have her come in for lumbar MRI review? Thanks ----- Message ----- From: Interface, Rad Results In Sent: 10/18/2022   9:00 AM EDT To: Juanda Chance, NP

## 2022-10-26 ENCOUNTER — Ambulatory Visit (INDEPENDENT_AMBULATORY_CARE_PROVIDER_SITE_OTHER): Payer: 59 | Admitting: Physical Medicine and Rehabilitation

## 2022-10-26 ENCOUNTER — Encounter: Payer: Self-pay | Admitting: Physical Medicine and Rehabilitation

## 2022-10-26 ENCOUNTER — Other Ambulatory Visit: Payer: Self-pay | Admitting: Physical Medicine and Rehabilitation

## 2022-10-26 DIAGNOSIS — M48062 Spinal stenosis, lumbar region with neurogenic claudication: Secondary | ICD-10-CM

## 2022-10-26 DIAGNOSIS — M5416 Radiculopathy, lumbar region: Secondary | ICD-10-CM | POA: Diagnosis not present

## 2022-10-26 DIAGNOSIS — M47816 Spondylosis without myelopathy or radiculopathy, lumbar region: Secondary | ICD-10-CM

## 2022-10-26 MED ORDER — TRAMADOL HCL 50 MG PO TABS: 50.0000 mg | ORAL_TABLET | Freq: Three times a day (TID) | ORAL | 0 refills | Status: DC | PRN

## 2022-10-26 MED ORDER — DIAZEPAM 5 MG PO TABS: ORAL_TABLET | ORAL | 0 refills | Status: DC

## 2022-10-26 NOTE — Progress Notes (Unsigned)
Joanna [name] - 62 y.o. female MRN [id number]  Date of birth: 02-Oct-1960  Office Visit Note: Visit Date: 10/26/2022 PCP: Marcine Matar, MD Referred by: Marcine Matar, MD  Subjective: No chief complaint on file.  HPI: Joanna [name] is a 62 y.o. female who comes in today for evaluation of chronic, worsening and severe right sided lower back pain radiating to buttock and down lateral thigh to knee. Intermittent pain to left lower extremity. Pain ongoing for several months, worsens with prolonged standing and walking. She describes pain as sore, sharp and aching, currently rates as 8 out of 10. Also reports feeling of weakness to right lower extremity. Some relief of pain with home exercise regimen, rest and use of medications. Recent lumbar MRI imaging exhibits severe spinal canal stenosis at L3-L4 and L4-L5, moderate at L2-L3. There is multi level moderate facet arthropathy. Patient ambulates with cane, sister reports she is very unsteady on her feet. Patient denies focal weakness, numbness and tingling. No recent trauma or falls.    Review of Systems  Musculoskeletal:  Positive for back pain.  Neurological:  Negative for tingling, sensory change, focal weakness and weakness.  All other systems reviewed and are negative.  Otherwise per HPI.  Assessment & Plan: Visit Diagnoses:    ICD-10-CM   1. Lumbar radiculopathy  M54.16     2. Spinal stenosis of lumbar region with neurogenic claudication  M48.062     3. Facet arthropathy, lumbar  M47.816        Plan: Findings:  Chronic, worsening and severe right sided lower back pain radiating to buttock and down lateral thigh to knee. Intermittent pain to left lower extremity. I discussed lumbar MRI with patient and sister using imaging and spine model. Patient continues to have severe pain despite good conservative therapies such as home exercise regimen, rest and use of medications. Patients clinical presentation and exam are  consistent with neurogenic claudication as a result of spinal canal stenosis. There is severe spinal canal stenosis at L3-L4 and L4-L5 on recent lumbar MRI imaging. I discussed treatment plan in detail today. Next step is to perform diagnostic and hopefully therapeutic bilateral L4 transforaminal epidural steroid injection under fluoroscopic guidance. If good relief of pain with injection we can repeat this procedure infrequently as needed. Patient did voice anxiety related to needles, I called in pre-procedure Valium for her to take on day of injection procedure. I also discussed medication management and prescribed short course of Tramadol under we are able to get her back in for injections. Patient has no questions at this time. No red flag symptoms noted upon exam today.     Meds & Orders: No orders of the defined types were placed in this encounter.  No orders of the defined types were placed in this encounter.   Follow-up: Return for Bilateral L4 transforaminal epidural steroid injection.   Procedures: No procedures performed      Clinical History: CLINICAL DATA:  Low back pain for over 6 weeks   EXAM: MRI LUMBAR SPINE WITHOUT CONTRAST   TECHNIQUE: Multiplanar, multisequence MR imaging of the lumbar spine was performed. No intravenous contrast was administered.   COMPARISON:  None Available.   FINDINGS: Segmentation:  Standard.   Alignment:  Physiologic.   Vertebrae: No acute fracture, evidence of discitis, or aggressive bone lesion.   Conus medullaris and cauda equina: Conus extends to the L1 level. Conus and cauda equina appear normal.   Paraspinal and other soft tissues: No acute  paraspinal abnormality.   Disc levels:   Disc spaces: Degenerative disease with disc height loss throughout the lumbar spine.   T10-11: Broad-based disc bulge. Bilateral facet arthropathy. Moderate bilateral foraminal stenosis. Mild spinal stenosis.   T11-12: Broad-based disc bulge.  Moderate bilateral facet arthropathy. Moderate right and moderate-severe left foraminal stenosis. Mild spinal stenosis.   T12-L1: Broad-based disc bulge. Moderate bilateral facet arthropathy. Mild right foraminal stenosis. No left foraminal stenosis. No spinal stenosis.   L1-L2: Broad-based disc bulge. Moderate bilateral facet arthropathy. No foraminal or central canal stenosis.   L2-L3: Broad-based disc osteophyte complex. Moderate bilateral facet arthropathy. Moderate spinal stenosis. Bilateral subarticular recess stenosis. Mild bilateral foraminal stenosis.   L3-L4: Broad-based disc osteophyte complex. Moderate bilateral facet arthropathy. Severe spinal stenosis. Moderate-severe right foraminal stenosis. Moderate left foraminal stenosis.   L4-L5: Broad-based disc osteophyte complex. Moderate bilateral facet arthropathy. Severe spinal stenosis. Moderate-severe left foraminal stenosis. Moderate right foraminal stenosis.   L5-S1: Broad-based disc bulge. Moderate bilateral facet arthropathy. Bilateral subarticular recess stenosis. Moderate-severe left foraminal stenosis. Moderate right foraminal stenosis. Mild spinal stenosis.   IMPRESSION: 1. Diffuse lumbar spine spondylosis as described above. 2. No acute osseous injury of the lumbar spine.     Electronically Signed   By: Elige Ko M.D.   On: 10/18/2022 08:58   She reports that she has been smoking cigarettes. She started smoking about 34 years ago. She has a 60 pack-year smoking history. She has never used smokeless tobacco. No results for input(s): "HGBA1C", "LABURIC" in the last 8760 hours.  Objective:  VS:  HT:    WT:   BMI:     BP:   HR: bpm  TEMP: ( )  RESP:  Physical Exam Vitals and nursing note reviewed.  HENT:     Head: Normocephalic and atraumatic.     Right Ear: External ear normal.     Left Ear: External ear normal.     Nose: Nose normal.     Mouth/Throat:     Mouth: Mucous membranes are moist.   Eyes:     Extraocular Movements: Extraocular movements intact.  Cardiovascular:     Rate and Rhythm: Normal rate.     Pulses: Normal pulses.  Pulmonary:     Effort: Pulmonary effort is normal.  Abdominal:     General: Abdomen is flat. There is no distension.  Musculoskeletal:        General: Tenderness present.     Cervical back: Normal range of motion.     Comments: Patient is slow to rise from seated position to standing. Good lumbar range of motion. No pain noted with facet loading. 5/5 strength noted with bilateral hip flexion, knee flexion/extension, ankle dorsiflexion/plantarflexion and EHL. No clonus noted bilaterally. No pain upon palpation of greater trochanters. No pain with internal/external rotation of bilateral hips. Sensation intact bilaterally. Negative slump test bilaterally. Ambulates with cane, gait slow and unsteady.   Skin:    General: Skin is warm and dry.     Capillary Refill: Capillary refill takes less than 2 seconds.  Neurological:     Mental Status: She is alert and oriented to person, place, and time.     Gait: Gait abnormal.  Psychiatric:        Mood and Affect: Mood normal.        Behavior: Behavior normal.     Ortho Exam  Imaging: No results found.  Past Medical/Family/Surgical/Social History: Medications & Allergies reviewed per EMR, new medications updated. Patient Active Problem List  Diagnosis Date Noted   Nerve damage 08/22/2022   Encounter for monitoring dofetilide therapy 07/16/2022   Persistent atrial fibrillation (HCC) 01/16/2022   Hypercoagulable state due to persistent atrial fibrillation (HCC) 01/16/2022   Severe mitral regurgitation 01/15/2022   Chronic systolic heart failure (HCC) 01/15/2022   Amiodarone-induced thyroiditis 12/22/2021   Chronic combined systolic and diastolic CHF (congestive heart failure) (HCC) 09/07/2019   Former smoker 03/17/2018   Essential hypertension 03/17/2018   Atrial fibrillation with RVR (HCC)  03/08/2018   Accelerated hypertension 03/08/2018   Hyperglycemia 03/08/2018   Acute systolic (congestive) heart failure (HCC)    Hypertensive urgency 04/11/2015   COPD (chronic obstructive pulmonary disease) (HCC) 04/01/2015   Past Medical History:  Diagnosis Date   Atrial fibrillation with RVR (HCC)    Chronic combined systolic (congestive) and diastolic (congestive) heart failure (HCC)    Dyspnea    Goiter    Hypertension    NICM (nonischemic cardiomyopathy) (HCC)    a. 08/2005 Echo: EF 30-35%, mod diff HK. Mild to Mod MR. Mildly dil LA; b. 08/2005 Cath: Nl Cors. Elevated CO w/o shunt; c. 09/2007 Echo: EF 45%. Mild to mod MR; c. 03/2015 Echo: EF 45%, global HK. Gr1 DD. Mild MR. Mildly dil LA.   Family History  Problem Relation Age of Onset   Cancer Mother    Heart disease Father    Past Surgical History:  Procedure Laterality Date   APPENDECTOMY     BACK SURGERY     CARDIOVERSION N/A 03/14/2018   Procedure: CARDIOVERSION;  Surgeon: Pricilla Riffle, MD;  Location: [facility] ENDOSCOPY;  Service: Cardiovascular;  Laterality: N/A;   TEE WITHOUT CARDIOVERSION N/A 03/14/2018   Procedure: TRANSESOPHAGEAL ECHOCARDIOGRAM (TEE);  Surgeon: Pricilla Riffle, MD;  Location: [facility] ENDOSCOPY;  Service: Cardiovascular;  Laterality: N/A;   TEE WITHOUT CARDIOVERSION N/A 02/26/2022   Procedure: TRANSESOPHAGEAL ECHOCARDIOGRAM (TEE);  Surgeon: Dorthula Nettles, DO;  Location: [facility] ENDOSCOPY;  Service: Cardiovascular;  Laterality: N/A;   Social History   Occupational History   Occupation: unemployed    Comment: on disablity  Tobacco Use   Smoking status: Every Day    Current packs/day: 0.00    Average packs/day: 2.0 packs/day for 30.0 years (60.0 ttl pk-yrs)    Types: Cigarettes    Start date: 02/09/1988    Last attempt to quit: 02/08/2018    Years since quitting: 4.7   Smokeless tobacco: Never   Tobacco comments:    3 cigarettes daily 02/09/22  Vaping Use   Vaping status: Never Used  Substance and Sexual Activity    Alcohol use: No    Comment: quit 2 years ago   Drug use: No   Sexual activity: Not on file

## 2022-10-29 ENCOUNTER — Telehealth: Payer: Self-pay | Admitting: Physical Medicine and Rehabilitation

## 2022-10-29 ENCOUNTER — Inpatient Hospital Stay (HOSPITAL_COMMUNITY): Admission: RE | Admit: 2022-10-29 | Payer: 59 | Source: Ambulatory Visit | Admitting: Internal Medicine

## 2022-10-29 NOTE — Telephone Encounter (Signed)
Pt called requesting a call from [place] [facility]. Pt states she has some prescription questions. Please call pt at [phone]

## 2022-11-03 ENCOUNTER — Other Ambulatory Visit (HOSPITAL_COMMUNITY): Payer: Self-pay | Admitting: Internal Medicine

## 2022-11-06 ENCOUNTER — Telehealth (HOSPITAL_COMMUNITY): Payer: Self-pay | Admitting: Cardiology

## 2022-11-06 ENCOUNTER — Ambulatory Visit (HOSPITAL_COMMUNITY): Payer: 59 | Admitting: Internal Medicine

## 2022-11-06 NOTE — Telephone Encounter (Signed)
Patient called to request coreg dose clarification- reports she was given both 3.125 mg tabs and 6.25mg  tab  Reports she has been taking 3.125 BID Last OV provider increased to 6.25 BID however med adjustment not on AVS  Pt is reluctant to make change as last OV was told she was doing so well   Will forward to provider for input

## 2022-11-07 ENCOUNTER — Telehealth: Payer: Self-pay | Admitting: Physical Medicine and Rehabilitation

## 2022-11-07 MED ORDER — CARVEDILOL 3.125 MG PO TABS: 3.1250 mg | ORAL_TABLET | Freq: Two times a day (BID) | ORAL | 11 refills | Status: DC

## 2022-11-07 NOTE — Telephone Encounter (Signed)
Spoke with patient and explained to take 1 tablet an hour before her appointment

## 2022-11-07 NOTE — Telephone Encounter (Signed)
Patient called back needing clarification on the medication she is suppose to take tomorrow prior to her procedure. The number to contact patient is [phone]

## 2022-11-07 NOTE — Telephone Encounter (Signed)
Pt aware and voiced understanding   -updated rx sent to pharmacy

## 2022-11-08 ENCOUNTER — Ambulatory Visit: Payer: 59 | Admitting: Physical Medicine and Rehabilitation

## 2022-11-08 ENCOUNTER — Other Ambulatory Visit: Payer: Self-pay

## 2022-11-08 VITALS — BP 111/67 | HR 84

## 2022-11-08 DIAGNOSIS — M5416 Radiculopathy, lumbar region: Secondary | ICD-10-CM

## 2022-11-08 MED ORDER — METHYLPREDNISOLONE ACETATE 80 MG/ML IJ SUSP
80.0000 mg | Freq: Once | INTRAMUSCULAR | Status: AC
Start: 2022-11-08 — End: 2022-11-08
  Administered 2022-11-08: 80 mg

## 2022-11-08 NOTE — Progress Notes (Signed)
Functional Pain Scale - descriptive words and definitions  Distressing (6)    Pain is present/unable to complete most ADLs limited by pain/sleep is difficult and active distraction is only marginal. Moderate range order  Average Pain 8   +Driver, -BT, -Dye Allergies.  Lower back pain on both sides with radiation in the right leg to the ankle

## 2022-11-08 NOTE — Patient Instructions (Signed)

## 2022-11-12 ENCOUNTER — Other Ambulatory Visit: Payer: Self-pay | Admitting: Physical Medicine and Rehabilitation

## 2022-11-12 ENCOUNTER — Telehealth: Payer: Self-pay | Admitting: Physical Medicine and Rehabilitation

## 2022-11-12 NOTE — Telephone Encounter (Signed)
Patient called and wanted a refill on pain medication. [phone]

## 2022-11-12 NOTE — Progress Notes (Signed)
Joanna [name] - 62 y.o. female MRN [id number]  Date of birth: [birth year]  Office Visit Note: Visit Date: 11/08/2022 PCP: Marcine Matar, MD Referred by: Marcine Matar, MD  Subjective: Chief Complaint  Patient presents with   Lower Back - Pain   HPI:  Joanna [name] is a 62 y.o. female who comes in today at the request of Ellin Goodie, FNP for planned Bilateral L4-5 Lumbar Transforaminal epidural steroid injection with fluoroscopic guidance.  The patient has failed conservative care including home exercise, medications, time and activity modification.  This injection will be diagnostic and hopefully therapeutic.  Please see requesting physician notes for further details and justification. Consider L5-S1 interlaminar epidural off Eliquis.   ROS Otherwise per HPI.  Assessment & Plan: Visit Diagnoses:    ICD-10-CM   1. Lumbar radiculopathy  M54.16 XR C-ARM NO REPORT    Epidural Steroid injection    methylPREDNISolone acetate (DEPO-MEDROL) injection 80 mg      Plan: No additional findings.   Meds & Orders:  Meds ordered this encounter  Medications   methylPREDNISolone acetate (DEPO-MEDROL) injection 80 mg    Orders Placed This Encounter  Procedures   XR C-ARM NO REPORT   Epidural Steroid injection    Follow-up: Return for visit to requesting provider as needed.   Procedures: No procedures performed  Lumbosacral Transforaminal Epidural Steroid Injection - Sub-Pedicular Approach with Fluoroscopic Guidance  Patient: Joanna [name]      Date of Birth: 07-27-1960 MRN: [id number] PCP: Marcine Matar, MD      Visit Date: 11/08/2022   Universal Protocol:    Date/Time: 11/08/2022  Consent Given By: the patient  Position: PRONE  Additional Comments: Vital signs were monitored before and after the procedure. Patient was prepped and draped in the usual sterile fashion. The correct patient, procedure, and site was verified.   Injection Procedure Details:    Procedure diagnoses: Lumbar radiculopathy [M54.16]    Meds Administered:  Meds ordered this encounter  Medications   methylPREDNISolone acetate (DEPO-MEDROL) injection 80 mg    Laterality: Bilateral  Location/Site: L4  Needle:5.0 in., 22 ga.  Short bevel or Quincke spinal needle  Needle Placement: Transforaminal  Findings:    -Comments: Excellent flow of contrast along the nerve, nerve root and into the epidural space.  Procedure Details: After squaring off the end-plates to get a true AP view, the C-arm was positioned so that an oblique view of the foramen as noted above was visualized. The target area is just inferior to the "nose of the scotty dog" or sub pedicular. The soft tissues overlying this structure were infiltrated with 2-3 ml. of 1% Lidocaine without Epinephrine.  The spinal needle was inserted toward the target using a "trajectory" view along the fluoroscope beam.  Under AP and lateral visualization, the needle was advanced so it did not puncture dura and was located close the 6 O'Clock position of the pedical in AP tracterory. Biplanar projections were used to confirm position. Aspiration was confirmed to be negative for CSF and/or blood. A 1-2 ml. volume of Isovue-250 was injected and flow of contrast was noted at each level. Radiographs were obtained for documentation purposes.   After attaining the desired flow of contrast documented above, a 0.5 to 1.0 ml test dose of 0.25% Marcaine was injected into each respective transforaminal space.  The patient was observed for 90 seconds post injection.  After no sensory deficits were reported, and normal lower extremity motor function was noted,  the above injectate was administered so that equal amounts of the injectate were placed at each foramen (level) into the transforaminal epidural space.   Additional Comments:  No complications occurred Dressing: 2 x 2 sterile gauze and Band-Aid    Post-procedure  details: Patient was observed during the procedure. Post-procedure instructions were reviewed.  Patient left the clinic in stable condition.    Clinical History: CLINICAL DATA:  Low back pain for over 6 weeks   EXAM: MRI LUMBAR SPINE WITHOUT CONTRAST   TECHNIQUE: Multiplanar, multisequence MR imaging of the lumbar spine was performed. No intravenous contrast was administered.   COMPARISON:  None Available.   FINDINGS: Segmentation:  Standard.   Alignment:  Physiologic.   Vertebrae: No acute fracture, evidence of discitis, or aggressive bone lesion.   Conus medullaris and cauda equina: Conus extends to the L1 level. Conus and cauda equina appear normal.   Paraspinal and other soft tissues: No acute paraspinal abnormality.   Disc levels:   Disc spaces: Degenerative disease with disc height loss throughout the lumbar spine.   T10-11: Broad-based disc bulge. Bilateral facet arthropathy. Moderate bilateral foraminal stenosis. Mild spinal stenosis.   T11-12: Broad-based disc bulge. Moderate bilateral facet arthropathy. Moderate right and moderate-severe left foraminal stenosis. Mild spinal stenosis.   T12-L1: Broad-based disc bulge. Moderate bilateral facet arthropathy. Mild right foraminal stenosis. No left foraminal stenosis. No spinal stenosis.   L1-L2: Broad-based disc bulge. Moderate bilateral facet arthropathy. No foraminal or central canal stenosis.   L2-L3: Broad-based disc osteophyte complex. Moderate bilateral facet arthropathy. Moderate spinal stenosis. Bilateral subarticular recess stenosis. Mild bilateral foraminal stenosis.   L3-L4: Broad-based disc osteophyte complex. Moderate bilateral facet arthropathy. Severe spinal stenosis. Moderate-severe right foraminal stenosis. Moderate left foraminal stenosis.   L4-L5: Broad-based disc osteophyte complex. Moderate bilateral facet arthropathy. Severe spinal stenosis. Moderate-severe left  foraminal stenosis. Moderate right foraminal stenosis.   L5-S1: Broad-based disc bulge. Moderate bilateral facet arthropathy. Bilateral subarticular recess stenosis. Moderate-severe left foraminal stenosis. Moderate right foraminal stenosis. Mild spinal stenosis.   IMPRESSION: 1. Diffuse lumbar spine spondylosis as described above. 2. No acute osseous injury of the lumbar spine.     Electronically Signed   By: Elige Ko M.D.   On: 10/18/2022 08:58     Objective:  VS:  HT:    WT:   BMI:     BP:111/67  HR:84bpm  TEMP: ( )  RESP:  Physical Exam Vitals and nursing note reviewed.  Constitutional:      General: She is not in acute distress.    Appearance: Normal appearance. She is not ill-appearing.  HENT:     Head: Normocephalic and atraumatic.     Right Ear: External ear normal.     Left Ear: External ear normal.  Eyes:     Extraocular Movements: Extraocular movements intact.  Cardiovascular:     Rate and Rhythm: Normal rate.     Pulses: Normal pulses.  Pulmonary:     Effort: Pulmonary effort is normal. No respiratory distress.  Abdominal:     General: There is no distension.     Palpations: Abdomen is soft.  Musculoskeletal:        General: Tenderness present.     Cervical back: Neck supple.     Right lower leg: No edema.     Left lower leg: No edema.     Comments: Patient has good distal strength with no pain over the greater trochanters.  No clonus or focal weakness.  Skin:  Findings: No erythema, lesion or rash.  Neurological:     General: No focal deficit present.     Mental Status: She is alert and oriented to person, place, and time.     Sensory: No sensory deficit.     Motor: No weakness or abnormal muscle tone.     Coordination: Coordination normal.  Psychiatric:        Mood and Affect: Mood normal.        Behavior: Behavior normal.      Imaging: No results found.

## 2022-11-12 NOTE — Procedures (Signed)
Lumbosacral Transforaminal Epidural Steroid Injection - Sub-Pedicular Approach with Fluoroscopic Guidance  Patient: Joanna [name]      Date of Birth: [birth year] MRN: [id number] PCP: Marcine Matar, MD      Visit Date: 11/08/2022   Universal Protocol:    Date/Time: 11/08/2022  Consent Given By: the patient  Position: PRONE  Additional Comments: Vital signs were monitored before and after the procedure. Patient was prepped and draped in the usual sterile fashion. The correct patient, procedure, and site was verified.   Injection Procedure Details:   Procedure diagnoses: Lumbar radiculopathy [M54.16]    Meds Administered:  Meds ordered this encounter  Medications   methylPREDNISolone acetate (DEPO-MEDROL) injection 80 mg    Laterality: Bilateral  Location/Site: L4  Needle:5.0 in., 22 ga.  Short bevel or Quincke spinal needle  Needle Placement: Transforaminal  Findings:    -Comments: Excellent flow of contrast along the nerve, nerve root and into the epidural space.  Procedure Details: After squaring off the end-plates to get a true AP view, the C-arm was positioned so that an oblique view of the foramen as noted above was visualized. The target area is just inferior to the "nose of the scotty dog" or sub pedicular. The soft tissues overlying this structure were infiltrated with 2-3 ml. of 1% Lidocaine without Epinephrine.  The spinal needle was inserted toward the target using a "trajectory" view along the fluoroscope beam.  Under AP and lateral visualization, the needle was advanced so it did not puncture dura and was located close the 6 O'Clock position of the pedical in AP tracterory. Biplanar projections were used to confirm position. Aspiration was confirmed to be negative for CSF and/or blood. A 1-2 ml. volume of Isovue-250 was injected and flow of contrast was noted at each level. Radiographs were obtained for documentation purposes.   After attaining the  desired flow of contrast documented above, a 0.5 to 1.0 ml test dose of 0.25% Marcaine was injected into each respective transforaminal space.  The patient was observed for 90 seconds post injection.  After no sensory deficits were reported, and normal lower extremity motor function was noted,   the above injectate was administered so that equal amounts of the injectate were placed at each foramen (level) into the transforaminal epidural space.   Additional Comments:  No complications occurred Dressing: 2 x 2 sterile gauze and Band-Aid    Post-procedure details: Patient was observed during the procedure. Post-procedure instructions were reviewed.  Patient left the clinic in stable condition.

## 2022-11-13 ENCOUNTER — Ambulatory Visit (HOSPITAL_COMMUNITY)
Admission: RE | Admit: 2022-11-13 | Discharge: 2022-11-13 | Disposition: A | Discharge status: Home / Self Care | Payer: 59 | Source: Ambulatory Visit | Attending: Internal Medicine | Admitting: Internal Medicine

## 2022-11-13 ENCOUNTER — Other Ambulatory Visit: Payer: Self-pay | Admitting: Physical Medicine and Rehabilitation

## 2022-11-13 VITALS — BP 132/88 | HR 59 | Ht 72.0 in | Wt 146.4 lb

## 2022-11-13 DIAGNOSIS — Z7901 Long term (current) use of anticoagulants: Secondary | ICD-10-CM | POA: Diagnosis not present

## 2022-11-13 DIAGNOSIS — I5022 Chronic systolic (congestive) heart failure: Secondary | ICD-10-CM | POA: Insufficient documentation

## 2022-11-13 DIAGNOSIS — I110 Hypertensive heart disease with heart failure: Secondary | ICD-10-CM | POA: Insufficient documentation

## 2022-11-13 DIAGNOSIS — I4819 Other persistent atrial fibrillation: Secondary | ICD-10-CM | POA: Diagnosis not present

## 2022-11-13 DIAGNOSIS — Z79899 Other long term (current) drug therapy: Secondary | ICD-10-CM | POA: Diagnosis not present

## 2022-11-13 DIAGNOSIS — Z5181 Encounter for therapeutic drug level monitoring: Secondary | ICD-10-CM | POA: Diagnosis not present

## 2022-11-13 DIAGNOSIS — E0590 Thyrotoxicosis, unspecified without thyrotoxic crisis or storm: Secondary | ICD-10-CM | POA: Diagnosis not present

## 2022-11-13 DIAGNOSIS — D6869 Other thrombophilia: Secondary | ICD-10-CM | POA: Insufficient documentation

## 2022-11-13 LAB — BASIC METABOLIC PANEL
Anion gap: 15 (ref 5–15)
BUN: 21 mg/dL (ref 8–23)
CO2: 19 mmol/L — ABNORMAL LOW (ref 22–32)
Calcium: 9.2 mg/dL (ref 8.9–10.3)
Chloride: 102 mmol/L (ref 98–111)
Creatinine, Ser: 1.1 mg/dL — ABNORMAL HIGH (ref 0.44–1.00)
GFR, Estimated: 56 mL/min — ABNORMAL LOW (ref >60)
Glucose, Bld: 81 mg/dL (ref 70–99)
Potassium: 4.4 mmol/L (ref 3.5–5.1)
Sodium: 136 mmol/L (ref 135–145)

## 2022-11-13 LAB — MAGNESIUM: Magnesium: 1.8 mg/dL (ref 1.7–2.4)

## 2022-11-13 NOTE — Telephone Encounter (Signed)
Attempted to call patient, unable to leave voicemail.

## 2022-11-13 NOTE — Progress Notes (Signed)
Primary Care Physician: Marcine Matar, MD Primary Cardiologist: Dr Elease Hashimoto Primary Electrophysiologist: Dr Elberta Fortis [facility]: Dr Gasper Lloyd Referring Physician: Manson Passey PA   Joanna [name] is a 62 y.o. female with a history of HTN, combined chronic systolic and diastolic CHF, tobacco abuse, MR, and persistent atrial fibrillation who presents for follow up in the [facility].  The patient was initially diagnosed with atrial fibrillation on 03/08/18 at the ER after presenting with symptoms of dyspnea on exertion for one week prior. ECG at ER showed afib with RVR and she was given Lopressor and started on Cardizem drip and heparin. She underwent successful DCCV. Echo on admission showed severely reduced EF 20-25%, possibly tachycardia mediated. Discharged on Eliquis and Lopressor. On follow up, she was noted to be in afib again with RVR. Started on amiodarone which did well keeping her in SR. Unfortunately, she developed amiodarone induced hyperthyroidism and this was discontinued 10/16/21.  Patient was hospitalized 12/20/21 with acute CHF in the setting of rapid Afib. Echo showed EF back down again, 25-30% w/ global HK, LV mod-severely dilated, LA severely dilated w/ eccentric MR, posteriorly directed into LA, severe regurgitation. She was diuresed w/ IV Lasix and transitioned to PO with additions of Farxiga and spironolactone. Treated w/ Coreg and digoxin for rate control.   On follow up today, patient is s/p dofetilide admission 12/20-12/23/23. She converted with the medication and did not require DCCV. She had a post termination pause lasting 4.2 seconds, digoxin was discontinued and carvedilol decreased. Patient remains in SR today and is feeling well. She walked into the clinic instead of using the wheelchair.   On follow up 07/16/22, she is currently in NSR. S/p Tikosyn load 12/20-23/23. Seen by Francis Dowse 04/17/22 with stable Qtc and maintaining SR. She has  not noted any episodes of Afib since OV with [name]. She complains of right leg pain ongoing which is not new; she uses a cane for ambulation. She needs a refill of Tikosyn as she took her last dose this morning. No missed doses of anticoagulation.  On follow up 10/29/22, she is currently in NSR. She has had no episodes of Afib since last office visit. She feels well overall except she just received an injection to help with her right leg pain. No missed doses of Tikosyn. No bleeding issues on Eliquis.   Today, she denies symptoms of palpitations, chest pain, orthopnea, PND, lower extremity edema, dizziness, presyncope, syncope, snoring, daytime somnolence, bleeding, or neurologic sequela. The patient is tolerating medications without difficulties and is otherwise without complaint today.    Atrial Fibrillation Risk Factors:  she does not have symptoms or diagnosis of sleep apnea. she does not have a history of rheumatic fever. she does not have a history of alcohol use. The patient does not have a history of early familial atrial fibrillation or other arrhythmias.  she has a BMI of Body mass index is 19.86 kg/m.Marland Kitchen Filed Weights   11/13/22 1343  Weight: 66.4 kg    Family History  Problem Relation Age of Onset   Cancer Mother    Heart disease Father     Atrial Fibrillation Management history:  Previous antiarrhythmic drugs: amiodarone, dofetilide  Previous cardioversions: 03/14/2018 Previous ablations: none CHADS2VASC score: 3  Anticoagulation history: Eliquis   Past Medical History:  Diagnosis Date   Atrial fibrillation with RVR (HCC)    Chronic combined systolic (congestive) and diastolic (congestive) heart failure (HCC)    Dyspnea  Goiter    Hypertension    NICM (nonischemic cardiomyopathy) (HCC)    a. 08/2005 Echo: EF 30-35%, mod diff HK. Mild to Mod MR. Mildly dil LA; b. 08/2005 Cath: Nl Cors. Elevated CO w/o shunt; c. 09/2007 Echo: EF 45%. Mild to mod MR; c. 03/2015 Echo: EF  45%, global HK. Gr1 DD. Mild MR. Mildly dil LA.   Past Surgical History:  Procedure Laterality Date   APPENDECTOMY     BACK SURGERY     CARDIOVERSION N/A 03/14/2018   Procedure: CARDIOVERSION;  Surgeon: Pricilla Riffle, MD;  Location: [facility] ENDOSCOPY;  Service: Cardiovascular;  Laterality: N/A;   TEE WITHOUT CARDIOVERSION N/A 03/14/2018   Procedure: TRANSESOPHAGEAL ECHOCARDIOGRAM (TEE);  Surgeon: Pricilla Riffle, MD;  Location: [facility] ENDOSCOPY;  Service: Cardiovascular;  Laterality: N/A;   TEE WITHOUT CARDIOVERSION N/A 02/26/2022   Procedure: TRANSESOPHAGEAL ECHOCARDIOGRAM (TEE);  Surgeon: Dorthula Nettles, DO;  Location: [facility] ENDOSCOPY;  Service: Cardiovascular;  Laterality: N/A;    Current Outpatient Medications  Medication Sig Dispense Refill   carvedilol (COREG) 3.125 MG tablet Take 1 tablet (3.125 mg total) by mouth 2 (two) times daily with a meal. 60 tablet 11   diazepam (VALIUM) 5 MG tablet Take one tablet by mouth with food one hour prior to procedure. May repeat 30 minutes prior if needed. 2 tablet 0   dofetilide (TIKOSYN) 125 MCG capsule TAKE 1 CAPSULE(125 MCG) BY MOUTH TWICE DAILY 60 capsule 1   ELIQUIS 5 MG TABS tablet TAKE 1 TABLET BY MOUTH TWICE  DAILY 200 tablet 2   furosemide (LASIX) 40 MG tablet TAKE 1 TABLET BY MOUTH DAILY 90 tablet 2   methimazole (TAPAZOLE) 5 MG tablet Take 1 tablet (5 mg total) by mouth daily. 90 tablet 3   potassium chloride (KLOR-CON) 10 MEQ tablet Take 1 tablet (10 mEq total) by mouth daily. 90 tablet 3   sacubitril-valsartan (ENTRESTO) 97-103 MG TAKE 1 TABLET BY MOUTH TWICE  DAILY 200 tablet 3   spironolactone (ALDACTONE) 25 MG tablet TAKE 1/2 TABLET(12.5 MG) BY MOUTH DAILY 45 tablet 3   traMADol (ULTRAM) 50 MG tablet Take 1 tablet (50 mg total) by mouth every 8 (eight) hours as needed. 20 tablet 0   No current facility-administered medications for this encounter.    No Known Allergies  ROS- All systems are reviewed and negative except as per the HPI  above.  Physical Exam: Vitals:   11/13/22 1343  BP: 132/88  Pulse: (!) 59  Weight: 66.4 kg  Height: 6' (1.829 m)    GEN- The patient is well appearing, alert and oriented x 3 today.   Neck - no JVD or carotid bruit noted Lungs- Clear to ausculation bilaterally, normal work of breathing Heart- Regular rate and rhythm, no murmurs, rubs or gallops, PMI not laterally displaced Extremities- no clubbing, cyanosis, or edema Skin - no rash or ecchymosis noted   Wt Readings from Last 3 Encounters:  11/13/22 66.4 kg  08/22/22 71.8 kg  07/17/22 78 kg    EKG today demonstrates Vent. rate 59 BPM PR interval 164 ms QRS duration 90 ms QT/QTcB 432/427 ms P-R-T axes 81 68 75 Sinus bradycardia Cannot rule out Anterior infarct , age undetermined Abnormal ECG When compared with ECG of 22-Aug-2022 09:21, PREVIOUS ECG IS PRESENT  Echo 02/26/22 (TEE) demonstrated  1. Left ventricular ejection fraction, by estimation, is 35 to 40%. The  left ventricle has mild to moderately decreased function. The left  ventricle has no regional wall motion abnormalities.  Left ventricular  diastolic function could not be evaluated.   2. Right ventricular systolic function is normal. The right ventricular  size is normal.   3. Left atrial size was mild to moderately dilated. No left atrial/left  atrial appendage thrombus was detected.   4. The mitral valve is normal in structure. Mild to moderate mitral valve  regurgitation. No evidence of mitral stenosis.   5. The aortic valve is normal in structure. Aortic valve regurgitation is  not visualized. No aortic stenosis is present.   6. The inferior vena cava is normal in size with greater than 50%  respiratory variability, suggesting right atrial pressure of 3 mmHg.    Epic records are reviewed at length today   CHA2DS2-VASc Score = 3  The patient's score is based upon: CHF History: 1 HTN History: 1 Diabetes History: 0 Stroke History: 0 Vascular  Disease History: 0 Age Score: 0 Gender Score: 1       ASSESSMENT AND PLAN: 1. Persistent Atrial Fibrillation (ICD10:  I48.19) The patient's CHA2DS2-VASc score is 3, indicating a 3.2% annual risk of stroke.   Patient off amiodarone due to hyperthyroidism, on methimazole.  S/p dofetilide admission 12/20-12/23/23.  Patient appears to be maintaining SR. Continue dofetilide 500 mcg BID. QT stable. Check bmet/mag today. Continue Eliquis 5 mg BID Continue carvedilol 3.125 mg BID  She can be every 6 months after next visit.  2. Secondary Hypercoagulable State (ICD10:  D68.69) The patient is at significant risk for stroke/thromboembolism based upon her CHA2DS2-VASc Score of 3.  Continue Apixaban (Eliquis).   3. Chronic systolic CHF Suspected tachycardia mediated.  GDMT per [facility], followed by Dr Gasper Lloyd. Appears euvolemic today.    Follow up in 3 months Afib clinic for Tikosyn surveillance.   Lake Bells, PA-C Afib Clinic [facility] [address] [place], [place] [address] [phone] 11/13/2022 2:17 PM

## 2022-11-14 NOTE — Telephone Encounter (Signed)
Spoke with patient and she stated she thinks the injection helped for her back, but she is still having pain in the right leg.

## 2022-11-16 ENCOUNTER — Other Ambulatory Visit (HOSPITAL_COMMUNITY): Payer: Self-pay | Admitting: *Deleted

## 2022-11-16 ENCOUNTER — Telehealth: Payer: Self-pay | Admitting: Physical Medicine and Rehabilitation

## 2022-11-16 MED ORDER — MAGNESIUM OXIDE -MG SUPPLEMENT 400 (240 MG) MG PO TABS: 400.0000 mg | ORAL_TABLET | Freq: Every day | ORAL | 3 refills | Status: DC

## 2022-11-16 NOTE — Telephone Encounter (Signed)
See previous encounter

## 2022-11-16 NOTE — Telephone Encounter (Signed)
Attempted to call patient, unable to leave voicemail.

## 2022-11-16 NOTE — Telephone Encounter (Signed)
Spoke with patient and scheduled OV for 11/19/22.

## 2022-11-16 NOTE — Telephone Encounter (Signed)
Patient called returning your call. [phone]

## 2022-11-19 ENCOUNTER — Ambulatory Visit: Payer: 59 | Admitting: Physical Medicine and Rehabilitation

## 2022-11-19 ENCOUNTER — Encounter: Payer: Self-pay | Admitting: Physical Medicine and Rehabilitation

## 2022-11-19 VITALS — BP 118/76 | HR 70

## 2022-11-19 DIAGNOSIS — M5416 Radiculopathy, lumbar region: Secondary | ICD-10-CM

## 2022-11-19 DIAGNOSIS — M25561 Pain in right knee: Secondary | ICD-10-CM | POA: Diagnosis not present

## 2022-11-19 DIAGNOSIS — M48062 Spinal stenosis, lumbar region with neurogenic claudication: Secondary | ICD-10-CM | POA: Diagnosis not present

## 2022-11-19 DIAGNOSIS — G8929 Other chronic pain: Secondary | ICD-10-CM

## 2022-11-19 NOTE — Progress Notes (Unsigned)
Joanna [name] - 62 y.o. female MRN [id number]  Date of birth: [birth year]  Office Visit Note: Visit Date: 11/19/2022 PCP: Marcine Matar, MD Referred by: Marcine Matar, MD  Subjective: Chief Complaint  Patient presents with   Right Leg - Pain   HPI: Joanna [name] is a 62 y.o. female who comes in today for evaluation of chronic, worsening and severe right sided lower back pain radiating to buttock and down lateral thigh to knee. Significant and sustained relief of lower back pain with recent bilateral L4 transforaminal epidural steroid injection in our office on 11/08/2022. She reports lower back feels considerably better, however right sided knee pain remains and does radiate down right leg. She describes pain as sharp and stabbing sensation, currently rates as 8 out of 10. Some relief of pain when laying flat to sleep at night. Some relief of pain with Tramadol. Recent lumbar MRI imaging exhibits severe spinal canal stenosis at L3-L4 and L4-L5, moderate at L2-L3. There is multi level moderate facet arthropathy. Patient ambulates with cane, she is very unsteady on her feet. Patient denies focal weakness, numbness and tingling. No recent trauma or falls.     Review of Systems  Musculoskeletal:  Positive for joint pain.  Neurological:  Negative for tingling, sensory change, focal weakness and weakness.  All other systems reviewed and are negative.  Otherwise per HPI.  Assessment & Plan: Visit Diagnoses:    ICD-10-CM   1. Lumbar radiculopathy  M54.16     2. Spinal stenosis of lumbar region with neurogenic claudication  M48.062     3. Chronic pain of right knee  M25.561    G89.29        Plan: Findings:  Chronic, worsening and severe right sided lower back pain radiating to buttock and down lateral thigh to knee. Significant and sustained relief of lower back pain post injection, she continues to have pain to right knee radiating down lower leg post injection. Recent  bilateral L4 transforaminal epidural steroid injection was difficult to perform due to foraminal stenosis. We discussed treatment plan in detail today, there is severe spinal canal stenosis at L3-L4 and L4-L5. Patient seems very apprehensive to repeat lumbar injection at this time, would consider either transforaminal injection at the level of L3 or interlaminar approach at L5-S1. I am concerned her symptoms could be more of an intrinsic right knee issue. She has severe pain with walking, especially weight bearing on right leg. Tenderness noted upon palpation of medial and lateral joint lines. I spoke with Dr. Alvester Morin regarding her case and feel referral to my partner Karenann Cai, [place] for further evaluation of right knee pain is best option. If he feels this is more of lumbar spine issue we are happy to see her back to discuss injection options. Ultimately, if her symptoms persist would consider referral to Dr. Willia Craze for surgical consultation. Patient has no questions at this time. No red flag symptoms noted upon exam today.     Meds & Orders: No orders of the defined types were placed in this encounter.  No orders of the defined types were placed in this encounter.   Follow-up: Return if symptoms worsen or fail to improve.   Procedures: No procedures performed      Clinical History: CLINICAL DATA:  Low back pain for over 6 weeks   EXAM: MRI LUMBAR SPINE WITHOUT CONTRAST   TECHNIQUE: Multiplanar, multisequence MR imaging of the lumbar spine was performed. No intravenous contrast was administered.  COMPARISON:  None Available.   FINDINGS: Segmentation:  Standard.   Alignment:  Physiologic.   Vertebrae: No acute fracture, evidence of discitis, or aggressive bone lesion.   Conus medullaris and cauda equina: Conus extends to the L1 level. Conus and cauda equina appear normal.   Paraspinal and other soft tissues: No acute paraspinal abnormality.   Disc levels:   Disc spaces:  Degenerative disease with disc height loss throughout the lumbar spine.   T10-11: Broad-based disc bulge. Bilateral facet arthropathy. Moderate bilateral foraminal stenosis. Mild spinal stenosis.   T11-12: Broad-based disc bulge. Moderate bilateral facet arthropathy. Moderate right and moderate-severe left foraminal stenosis. Mild spinal stenosis.   T12-L1: Broad-based disc bulge. Moderate bilateral facet arthropathy. Mild right foraminal stenosis. No left foraminal stenosis. No spinal stenosis.   L1-L2: Broad-based disc bulge. Moderate bilateral facet arthropathy. No foraminal or central canal stenosis.   L2-L3: Broad-based disc osteophyte complex. Moderate bilateral facet arthropathy. Moderate spinal stenosis. Bilateral subarticular recess stenosis. Mild bilateral foraminal stenosis.   L3-L4: Broad-based disc osteophyte complex. Moderate bilateral facet arthropathy. Severe spinal stenosis. Moderate-severe right foraminal stenosis. Moderate left foraminal stenosis.   L4-L5: Broad-based disc osteophyte complex. Moderate bilateral facet arthropathy. Severe spinal stenosis. Moderate-severe left foraminal stenosis. Moderate right foraminal stenosis.   L5-S1: Broad-based disc bulge. Moderate bilateral facet arthropathy. Bilateral subarticular recess stenosis. Moderate-severe left foraminal stenosis. Moderate right foraminal stenosis. Mild spinal stenosis.   IMPRESSION: 1. Diffuse lumbar spine spondylosis as described above. 2. No acute osseous injury of the lumbar spine.     Electronically Signed   By: Elige Ko M.D.   On: 10/18/2022 08:58   She reports that she has been smoking cigarettes. She started smoking about 34 years ago. She has a 60 pack-year smoking history. She has never used smokeless tobacco. No results for input(s): "HGBA1C", "LABURIC" in the last 8760 hours.  Objective:  VS:  HT:    WT:   BMI:     BP:118/76  HR:70bpm  TEMP: ( )  RESP:  Physical  Exam Vitals and nursing note reviewed.  HENT:     Head: Normocephalic and atraumatic.     Right Ear: External ear normal.     Left Ear: External ear normal.     Nose: Nose normal.     Mouth/Throat:     Mouth: Mucous membranes are moist.  Eyes:     Extraocular Movements: Extraocular movements intact.  Cardiovascular:     Rate and Rhythm: Normal rate.     Pulses: Normal pulses.  Pulmonary:     Effort: Pulmonary effort is normal.  Abdominal:     General: Abdomen is flat.  Musculoskeletal:        General: Tenderness present.     Cervical back: Normal range of motion.     Comments: Patient is slow to rises from seated position to standing. Good lumbar range of motion. No pain noted with facet loading. 5/5 strength noted with bilateral hip flexion, knee flexion/extension, ankle dorsiflexion/plantarflexion and EHL. No clonus noted bilaterally. No pain upon palpation of greater trochanters. No pain with internal/external rotation of bilateral hips. Pain noted with flexion/extension of right knee. Joint line tenderness to both medial and lateral aspects of right knee. Positive straight leg raise on the right. Sensation intact bilaterally. Negative slump test bilaterally. Ambulates with cane, gait slow and unsteady.   Skin:    General: Skin is warm and dry.     Capillary Refill: Capillary refill takes less than 2 seconds.  Neurological:     General: No focal deficit present.     Mental Status: She is alert and oriented to person, place, and time.  Psychiatric:        Mood and Affect: Mood normal.        Behavior: Behavior normal.     Ortho Exam  Imaging: No results found.  Past Medical/Family/Surgical/Social History: Medications & Allergies reviewed per EMR, new medications updated. Patient Active Problem List   Diagnosis Date Noted   Nerve damage 08/22/2022   Encounter for monitoring dofetilide therapy 07/16/2022   Persistent atrial fibrillation (HCC) 01/16/2022   Hypercoagulable  state due to persistent atrial fibrillation (HCC) 01/16/2022   Severe mitral regurgitation 01/15/2022   Chronic systolic heart failure (HCC) 01/15/2022   Amiodarone-induced thyroiditis 12/22/2021   Chronic combined systolic and diastolic CHF (congestive heart failure) (HCC) 09/07/2019   Former smoker 03/17/2018   Essential hypertension 03/17/2018   Atrial fibrillation with RVR (HCC) 03/08/2018   Accelerated hypertension 03/08/2018   Hyperglycemia 03/08/2018   Acute systolic (congestive) heart failure (HCC)    Hypertensive urgency 04/11/2015   COPD (chronic obstructive pulmonary disease) (HCC) 04/01/2015   Past Medical History:  Diagnosis Date   Atrial fibrillation with RVR (HCC)    Chronic combined systolic (congestive) and diastolic (congestive) heart failure (HCC)    Dyspnea    Goiter    Hypertension    NICM (nonischemic cardiomyopathy) (HCC)    a. 08/2005 Echo: EF 30-35%, mod diff HK. Mild to Mod MR. Mildly dil LA; b. 08/2005 Cath: Nl Cors. Elevated CO w/o shunt; c. 09/2007 Echo: EF 45%. Mild to mod MR; c. 03/2015 Echo: EF 45%, global HK. Gr1 DD. Mild MR. Mildly dil LA.   Family History  Problem Relation Age of Onset   Cancer Mother    Heart disease Father    Past Surgical History:  Procedure Laterality Date   APPENDECTOMY     BACK SURGERY     CARDIOVERSION N/A 03/14/2018   Procedure: CARDIOVERSION;  Surgeon: Pricilla Riffle, MD;  Location: [facility] ENDOSCOPY;  Service: Cardiovascular;  Laterality: N/A;   TEE WITHOUT CARDIOVERSION N/A 03/14/2018   Procedure: TRANSESOPHAGEAL ECHOCARDIOGRAM (TEE);  Surgeon: Pricilla Riffle, MD;  Location: [facility] ENDOSCOPY;  Service: Cardiovascular;  Laterality: N/A;   TEE WITHOUT CARDIOVERSION N/A 02/26/2022   Procedure: TRANSESOPHAGEAL ECHOCARDIOGRAM (TEE);  Surgeon: Dorthula Nettles, DO;  Location: [facility] ENDOSCOPY;  Service: Cardiovascular;  Laterality: N/A;   Social History   Occupational History   Occupation: unemployed    Comment: on disablity  Tobacco Use    Smoking status: Every Day    Current packs/day: 0.00    Average packs/day: 2.0 packs/day for 30.0 years (60.0 ttl pk-yrs)    Types: Cigarettes    Start date: 02/09/1988    Last attempt to quit: 02/08/2018    Years since quitting: 4.7   Smokeless tobacco: Never   Tobacco comments:    3 cigarettes daily 02/09/22  Vaping Use   Vaping status: Never Used  Substance and Sexual Activity   Alcohol use: No    Comment: quit 2 years ago   Drug use: No   Sexual activity: Not on file

## 2022-11-19 NOTE — Progress Notes (Unsigned)
Functional Pain Scale - descriptive words and definitions  Immobilizing (10)   Unable to move or talk due to intensity of pain/unable to sleep and unable to use distraction. Severe range order  Average Pain: it's a 10 every day   +Driver, -BT, -Dye Allergies.   Patient describes pain all around her right knee x 1 year. NKI Radiates down anterior leg to just above the ankle. Hurts to stretch leg out at night in bed (lying on back). Ambulates with a cane.

## 2022-11-29 ENCOUNTER — Ambulatory Visit: Payer: 59 | Admitting: Internal Medicine

## 2022-11-30 ENCOUNTER — Ambulatory Visit (INDEPENDENT_AMBULATORY_CARE_PROVIDER_SITE_OTHER): Payer: 59 | Admitting: Surgical

## 2022-11-30 ENCOUNTER — Encounter: Payer: Self-pay | Admitting: Surgical

## 2022-11-30 ENCOUNTER — Other Ambulatory Visit (INDEPENDENT_AMBULATORY_CARE_PROVIDER_SITE_OTHER): Payer: 59

## 2022-11-30 ENCOUNTER — Other Ambulatory Visit: Payer: Self-pay

## 2022-11-30 DIAGNOSIS — G8929 Other chronic pain: Secondary | ICD-10-CM | POA: Diagnosis not present

## 2022-11-30 DIAGNOSIS — M25561 Pain in right knee: Secondary | ICD-10-CM

## 2022-11-30 DIAGNOSIS — M1611 Unilateral primary osteoarthritis, right hip: Secondary | ICD-10-CM

## 2022-12-02 ENCOUNTER — Encounter: Payer: Self-pay | Admitting: Surgical

## 2022-12-02 MED ORDER — BUPIVACAINE HCL 0.25 % IJ SOLN
7.0000 mL | INTRAMUSCULAR | Status: AC | PRN
Start: 2022-11-30 — End: 2022-11-30
  Administered 2022-11-30: 7 mL via INTRA_ARTICULAR

## 2022-12-02 MED ORDER — METHYLPREDNISOLONE ACETATE 40 MG/ML IJ SUSP
40.0000 mg | INTRAMUSCULAR | Status: AC | PRN
Start: 2022-11-30 — End: 2022-11-30
  Administered 2022-11-30: 40 mg via INTRA_ARTICULAR

## 2022-12-02 NOTE — Progress Notes (Signed)
Office Visit Note   Patient: Joanna [name]           Date of Birth: 1960/11/21           MRN: [id number] Visit Date: 11/30/2022 Requested by: Juanda Chance, NP [id number],  [place] [address] PCP: Marcine Matar, MD  Subjective: Chief Complaint  Patient presents with   Right Knee - Pain    HPI: Gretchyn Brackins is a 62 y.o. female who presents to the office reporting her right knee pain.  Patient notes pain over the last year without any history of injury.  It has been progressively increasing over the last year.  She has also seen Ellin Goodie, NP and Dr. Alvester Morin with lumbar spine ESI that gave her relief of her right-sided radiculopathy that was causing pain in her right buttock and thigh extending down to the calf.  She states that she notices knee pain diffusely throughout the right knee as well as somewhat in the medial aspect of the knee.  This is worse with weightbearing.  ESI did not help with pain in her knee at all.  Did help more with her back pain and buttock pain.  She does note occasional groin pain but this is not as bothersome for her as the knee pain.  Does have occasional popping sensation but nothing consistent.  She has had to start ambulating with a cane due to pain.  No history of prior hip or back surgery.  No history of prior knee surgery.  Pain will occasionally wake her up from sleep at night.  No fevers or chills..                ROS: All systems reviewed are negative as they relate to the chief complaint within the history of present illness.  Patient denies fevers or chills.  Assessment & Plan: Visit Diagnoses:  1. Chronic pain of right knee   2. Unilateral primary osteoarthritis, right hip     Plan: Patient is a 62 year old female who presents for evaluation of right knee pain.  Patient has previously seen Dr. Alvester Morin and Ellin Goodie, NP for her back pain with good resolution of a lot of her back pain and some of her radicular pain with  lumbar spine ESI.  However, knee pain continues to give her significant difficulty and she continues to walk with a cane due to the amount of pain she has.  Has a little bit of groin pain but not her main complaint.  However on radiographs taken today there is no significant degenerative changes noted of the right knee.  Previous radiographs of her lumbar spine do demonstrate severe degenerative changes of the right hip and new radiographs of the pelvis were taken today.  She seems to have more pain localizing to her knee with hip range of motion that she does with any knee range of motion or palpation in the knee.  We discussed options availed the patient and after discussion, she would like to try right hip injection.  This was administered under ultrasound guidance and patient tolerated procedure well.  Vascular structures were identified with color Doppler and avoided, especially with patient on Eliquis.  Plan to follow-up in 4 weeks for clinical recheck and discussion for or against hip replacement in the future depending on how she does with this injection.  Follow-Up Instructions: No follow-ups on file.   Orders:  Orders Placed This Encounter  Procedures   XR Knee 1-2 Views  Right   XR HIP UNILAT W OR W/O PELVIS 2-3 VIEWS RIGHT   US Guided Needle Placement - No Linked Charges   No orders of the defined types were placed in this encounter.     Procedures: Large Joint Inj: R hip joint on 11/30/2022 11:19 AM Indications: pain and diagnostic evaluation Details: 22 G 3.5 in needle, ultrasound-guided anterior approach  Arthrogram: No  Medications: 7 mL bupivacaine 0.25 %; 40 mg methylPREDNISolone acetate 40 MG/ML Outcome: tolerated well, no immediate complications Procedure, treatment alternatives, risks and benefits explained, specific risks discussed. Consent was given by the patient. Immediately prior to procedure a time out was called to verify the correct patient, procedure, equipment,  support staff and site/side marked as required. Patient was prepped and draped in the usual sterile fashion.       Clinical Data: No additional findings.  Objective: Vital Signs: LMP 06/10/2011   Physical Exam:  Constitutional: Patient appears well-developed HEENT:  Head: Normocephalic Eyes:EOM are normal Neck: Normal range of motion Cardiovascular: Normal rate Pulmonary/chest: Effort normal Neurologic: Patient is alert Skin: Skin is warm Psychiatric: Patient has normal mood and affect  Ortho Exam: Ortho exam demonstrates right knee with well-preserved range of motion from 0 degrees extension to 125 degrees of knee flexion without any significant pain.  She has no knee effusion.  No calf tenderness.  Negative Homans' sign.  She does have significant pain localizing to the knee with passive hip flexion and internal rotation.  She is significantly limited internal rotation of the right hip relative to the left which has normal internal rotation.  Positive Stinchfield sign.  Positive FADIR sign.  She has intact quad strength, hip flexion strength, hamstring/dorsiflexion/plantarflexion strength.  No cellulitis or skin changes noted around the right knee or hip.  Minimal joint line tenderness over the medial joint line and no tenderness over the lateral joint line.  Specialty Comments:  CLINICAL DATA:  Low back pain for over 6 weeks   EXAM: MRI LUMBAR SPINE WITHOUT CONTRAST   TECHNIQUE: Multiplanar, multisequence MR imaging of the lumbar spine was performed. No intravenous contrast was administered.   COMPARISON:  None Available.   FINDINGS: Segmentation:  Standard.   Alignment:  Physiologic.   Vertebrae: No acute fracture, evidence of discitis, or aggressive bone lesion.   Conus medullaris and cauda equina: Conus extends to the L1 level. Conus and cauda equina appear normal.   Paraspinal and other soft tissues: No acute paraspinal abnormality.   Disc levels:   Disc  spaces: Degenerative disease with disc height loss throughout the lumbar spine.   T10-11: Broad-based disc bulge. Bilateral facet arthropathy. Moderate bilateral foraminal stenosis. Mild spinal stenosis.   T11-12: Broad-based disc bulge. Moderate bilateral facet arthropathy. Moderate right and moderate-severe left foraminal stenosis. Mild spinal stenosis.   T12-L1: Broad-based disc bulge. Moderate bilateral facet arthropathy. Mild right foraminal stenosis. No left foraminal stenosis. No spinal stenosis.   L1-L2: Broad-based disc bulge. Moderate bilateral facet arthropathy. No foraminal or central canal stenosis.   L2-L3: Broad-based disc osteophyte complex. Moderate bilateral facet arthropathy. Moderate spinal stenosis. Bilateral subarticular recess stenosis. Mild bilateral foraminal stenosis.   L3-L4: Broad-based disc osteophyte complex. Moderate bilateral facet arthropathy. Severe spinal stenosis. Moderate-severe right foraminal stenosis. Moderate left foraminal stenosis.   L4-L5: Broad-based disc osteophyte complex. Moderate bilateral facet arthropathy. Severe spinal stenosis. Moderate-severe left foraminal stenosis. Moderate right foraminal stenosis.   L5-S1: Broad-based disc bulge. Moderate bilateral facet arthropathy. Bilateral subarticular recess stenosis. Moderate-severe left foraminal stenosis.  Moderate right foraminal stenosis. Mild spinal stenosis.   IMPRESSION: 1. Diffuse lumbar spine spondylosis as described above. 2. No acute osseous injury of the lumbar spine.     Electronically Signed   By: Elige Ko M.D.   On: 10/18/2022 08:58  Imaging: No results found.   PMFS History: Patient Active Problem List   Diagnosis Date Noted   Nerve damage 08/22/2022   Encounter for monitoring dofetilide therapy 07/16/2022   Persistent atrial fibrillation (HCC) 01/16/2022   Hypercoagulable state due to persistent atrial fibrillation (HCC) 01/16/2022   Severe mitral  regurgitation 01/15/2022   Chronic systolic heart failure (HCC) 01/15/2022   Amiodarone-induced thyroiditis 12/22/2021   Chronic combined systolic and diastolic CHF (congestive heart failure) (HCC) 09/07/2019   Former smoker 03/17/2018   Essential hypertension 03/17/2018   Atrial fibrillation with RVR (HCC) 03/08/2018   Accelerated hypertension 03/08/2018   Hyperglycemia 03/08/2018   Acute systolic (congestive) heart failure (HCC)    Hypertensive urgency 04/11/2015   COPD (chronic obstructive pulmonary disease) (HCC) 04/01/2015   Past Medical History:  Diagnosis Date   Atrial fibrillation with RVR (HCC)    Chronic combined systolic (congestive) and diastolic (congestive) heart failure (HCC)    Dyspnea    Goiter    Hypertension    NICM (nonischemic cardiomyopathy) (HCC)    a. 08/2005 Echo: EF 30-35%, mod diff HK. Mild to Mod MR. Mildly dil LA; b. 08/2005 Cath: Nl Cors. Elevated CO w/o shunt; c. 09/2007 Echo: EF 45%. Mild to mod MR; c. 03/2015 Echo: EF 45%, global HK. Gr1 DD. Mild MR. Mildly dil LA.    Family History  Problem Relation Age of Onset   Cancer Mother    Heart disease Father     Past Surgical History:  Procedure Laterality Date   APPENDECTOMY     BACK SURGERY     CARDIOVERSION N/A 03/14/2018   Procedure: CARDIOVERSION;  Surgeon: Pricilla Riffle, MD;  Location: [facility] ENDOSCOPY;  Service: Cardiovascular;  Laterality: N/A;   TEE WITHOUT CARDIOVERSION N/A 03/14/2018   Procedure: TRANSESOPHAGEAL ECHOCARDIOGRAM (TEE);  Surgeon: Pricilla Riffle, MD;  Location: [facility] ENDOSCOPY;  Service: Cardiovascular;  Laterality: N/A;   TEE WITHOUT CARDIOVERSION N/A 02/26/2022   Procedure: TRANSESOPHAGEAL ECHOCARDIOGRAM (TEE);  Surgeon: Dorthula Nettles, DO;  Location: [facility] ENDOSCOPY;  Service: Cardiovascular;  Laterality: N/A;   Social History   Occupational History   Occupation: unemployed    Comment: on disablity  Tobacco Use   Smoking status: Every Day    Current packs/day: 0.00    Average  packs/day: 2.0 packs/day for 30.0 years (60.0 ttl pk-yrs)    Types: Cigarettes    Start date: 02/09/1988    Last attempt to quit: 02/08/2018    Years since quitting: 4.8   Smokeless tobacco: Never   Tobacco comments:    3 cigarettes daily 02/09/22  Vaping Use   Vaping status: Never Used  Substance and Sexual Activity   Alcohol use: No    Comment: quit 2 years ago   Drug use: No   Sexual activity: Not on file

## 2022-12-19 ENCOUNTER — Other Ambulatory Visit: Payer: Self-pay | Admitting: Physical Medicine and Rehabilitation

## 2022-12-19 ENCOUNTER — Other Ambulatory Visit: Payer: Self-pay | Admitting: Surgical

## 2022-12-19 ENCOUNTER — Telehealth: Payer: Self-pay | Admitting: Orthopedic Surgery

## 2022-12-19 MED ORDER — TRAMADOL HCL 50 MG PO TABS: 50.0000 mg | ORAL_TABLET | Freq: Two times a day (BID) | ORAL | 0 refills | Status: DC | PRN

## 2022-12-19 NOTE — Telephone Encounter (Signed)
Refilled tramadol. Can you ask how her knee/hip pain is doing after injection?

## 2022-12-19 NOTE — Telephone Encounter (Signed)
Patient called asked if she can get something called into her pharmacy form pain?  Patient uses [facility] on [address]. The number to contact patient is [phone]

## 2022-12-20 ENCOUNTER — Other Ambulatory Visit: Payer: Self-pay | Admitting: Physical Medicine and Rehabilitation

## 2022-12-20 NOTE — Telephone Encounter (Signed)
Tried calling, no answer, no VM to LM

## 2022-12-21 ENCOUNTER — Encounter (HOSPITAL_COMMUNITY): Payer: 59 | Admitting: Cardiology

## 2022-12-31 ENCOUNTER — Ambulatory Visit: Payer: 59 | Admitting: Orthopedic Surgery

## 2023-01-03 ENCOUNTER — Other Ambulatory Visit (HOSPITAL_COMMUNITY): Payer: Self-pay | Admitting: Internal Medicine

## 2023-01-09 ENCOUNTER — Ambulatory Visit (INDEPENDENT_AMBULATORY_CARE_PROVIDER_SITE_OTHER): Payer: 59 | Admitting: Orthopedic Surgery

## 2023-01-09 DIAGNOSIS — M1611 Unilateral primary osteoarthritis, right hip: Secondary | ICD-10-CM

## 2023-01-09 MED ORDER — ACETAMINOPHEN-CODEINE 300-30 MG PO TABS: ORAL_TABLET | ORAL | 0 refills | Status: DC

## 2023-01-10 LAB — CBC WITH DIFFERENTIAL/PLATELET
Absolute Lymphocytes: 2569 {cells}/uL (ref 850–3900)
Absolute Monocytes: 426 {cells}/uL (ref 200–950)
Basophils Absolute: 61 {cells}/uL (ref 0–200)
Basophils Relative: 0.8 %
Eosinophils Absolute: 137 {cells}/uL (ref 15–500)
Eosinophils Relative: 1.8 %
HCT: 42 % (ref 35.0–45.0)
Hemoglobin: 13.4 g/dL (ref 11.7–15.5)
MCH: 28.2 pg (ref 27.0–33.0)
MCHC: 31.9 g/dL — ABNORMAL LOW (ref 32.0–36.0)
MCV: 88.2 fL (ref 80.0–100.0)
MPV: 10.1 fL (ref 7.5–12.5)
Monocytes Relative: 5.6 %
Neutro Abs: 4408 {cells}/uL (ref 1500–7800)
Neutrophils Relative %: 58 %
Platelets: 410 10*3/uL — ABNORMAL HIGH (ref 140–400)
RBC: 4.76 10*6/uL (ref 3.80–5.10)
RDW: 11.3 % (ref 11.0–15.0)
Total Lymphocyte: 33.8 %
WBC: 7.6 10*3/uL (ref 3.8–10.8)

## 2023-01-10 LAB — ALBUMIN: Albumin: 4.3 g/dL (ref 3.6–5.1)

## 2023-01-11 ENCOUNTER — Encounter: Payer: Self-pay | Admitting: Orthopedic Surgery

## 2023-01-11 NOTE — Progress Notes (Signed)
Office Visit Note   Patient: Joanna [name]           Date of Birth: Oct 15, 1960           MRN: [id number] Visit Date: 01/09/2023 Requested by: Marcine Matar, MD [address] [place],  [place] [address] PCP: Marcine Matar, MD  Subjective: Chief Complaint  Patient presents with   Other    Follow up right hip/knee pain    HPI: Joanna [name] is a 62 y.o. female who presents to the office reporting right hip pain.  Patient had right hip injection 11/30/2022.  Gave her relief for about 2 weeks.  She is ambulating with the cane.  Describes constant pain which does wake her from sleep at night.  Pain does radiate down the right leg to the knee.  Injection helped the groin pain but she still has some back pain.  Tramadol has not been helpful.  She has known severe right hip arthritis along with back issues.  She has had epidural steroid injections which have helped her back as well.  However that did not help with the pain in the knee which I think is referred pain from the hip..                ROS: All systems reviewed are negative as they relate to the chief complaint within the history of present illness.  Patient denies fevers or chills.  Assessment & Plan: Visit Diagnoses:  1. Unilateral primary osteoarthritis, right hip     Plan: Impression is severe right hip arthritis along with back issues.  Plan is one-time prescription for Tylenol 3.  Checked albumin today which was okay and CBC differential also showed normal values.  She is going to think about her situation for a month and let me know if she wants to proceed with total hip replacement.  Follow-up with [place] as needed.  Follow-Up Instructions: No follow-ups on file.   Orders:  Orders Placed This Encounter  Procedures   CBC with Differential   Albumin   Meds ordered this encounter  Medications   acetaminophen-codeine (TYLENOL #3) 300-30 MG tablet    Sig: 1 po q 8 hrs prn pain    Dispense:  30 tablet     Refill:  0      Procedures: No procedures performed   Clinical Data: No additional findings.  Objective: Vital Signs: LMP 06/10/2011   Physical Exam:  Constitutional: Patient appears well-developed HEENT:  Head: Normocephalic Eyes:EOM are normal Neck: Normal range of motion Cardiovascular: Normal rate Pulmonary/chest: Effort normal Neurologic: Patient is alert Skin: Skin is warm Psychiatric: Patient has normal mood and affect  Ortho Exam: Ortho exam demonstrates diminished right hip range of motion consistent with her known diagnosis of hip arthritis.  She has no nerve root tension signs.  5 out of 5 ankle dorsiflexion plantarflexion quad hamstring strength.  No paresthesias L1 S1 bilaterally.  Antalgic Trendelenburg type gait is present.  Specialty Comments:  CLINICAL DATA:  Low back pain for over 6 weeks   EXAM: MRI LUMBAR SPINE WITHOUT CONTRAST   TECHNIQUE: Multiplanar, multisequence MR imaging of the lumbar spine was performed. No intravenous contrast was administered.   COMPARISON:  None Available.   FINDINGS: Segmentation:  Standard.   Alignment:  Physiologic.   Vertebrae: No acute fracture, evidence of discitis, or aggressive bone lesion.   Conus medullaris and cauda equina: Conus extends to the L1 level. Conus and cauda equina appear  normal.   Paraspinal and other soft tissues: No acute paraspinal abnormality.   Disc levels:   Disc spaces: Degenerative disease with disc height loss throughout the lumbar spine.   T10-11: Broad-based disc bulge. Bilateral facet arthropathy. Moderate bilateral foraminal stenosis. Mild spinal stenosis.   T11-12: Broad-based disc bulge. Moderate bilateral facet arthropathy. Moderate right and moderate-severe left foraminal stenosis. Mild spinal stenosis.   T12-L1: Broad-based disc bulge. Moderate bilateral facet arthropathy. Mild right foraminal stenosis. No left foraminal stenosis. No spinal stenosis.   L1-L2:  Broad-based disc bulge. Moderate bilateral facet arthropathy. No foraminal or central canal stenosis.   L2-L3: Broad-based disc osteophyte complex. Moderate bilateral facet arthropathy. Moderate spinal stenosis. Bilateral subarticular recess stenosis. Mild bilateral foraminal stenosis.   L3-L4: Broad-based disc osteophyte complex. Moderate bilateral facet arthropathy. Severe spinal stenosis. Moderate-severe right foraminal stenosis. Moderate left foraminal stenosis.   L4-L5: Broad-based disc osteophyte complex. Moderate bilateral facet arthropathy. Severe spinal stenosis. Moderate-severe left foraminal stenosis. Moderate right foraminal stenosis.   L5-S1: Broad-based disc bulge. Moderate bilateral facet arthropathy. Bilateral subarticular recess stenosis. Moderate-severe left foraminal stenosis. Moderate right foraminal stenosis. Mild spinal stenosis.   IMPRESSION: 1. Diffuse lumbar spine spondylosis as described above. 2. No acute osseous injury of the lumbar spine.     Electronically Signed   By: Elige Ko M.D.   On: 10/18/2022 08:58  Imaging: No results found.   PMFS History: Patient Active Problem List   Diagnosis Date Noted   Nerve damage 08/22/2022   Encounter for monitoring dofetilide therapy 07/16/2022   Persistent atrial fibrillation (HCC) 01/16/2022   Hypercoagulable state due to persistent atrial fibrillation (HCC) 01/16/2022   Severe mitral regurgitation 01/15/2022   Chronic systolic heart failure (HCC) 01/15/2022   Amiodarone-induced thyroiditis 12/22/2021   Chronic combined systolic and diastolic CHF (congestive heart failure) (HCC) 09/07/2019   Former smoker 03/17/2018   Essential hypertension 03/17/2018   Atrial fibrillation with RVR (HCC) 03/08/2018   Accelerated hypertension 03/08/2018   Hyperglycemia 03/08/2018   Acute systolic (congestive) heart failure (HCC)    Hypertensive urgency 04/11/2015   COPD (chronic obstructive pulmonary disease)  (HCC) 04/01/2015   Past Medical History:  Diagnosis Date   Atrial fibrillation with RVR (HCC)    Chronic combined systolic (congestive) and diastolic (congestive) heart failure (HCC)    Dyspnea    Goiter    Hypertension    NICM (nonischemic cardiomyopathy) (HCC)    a. 08/2005 Echo: EF 30-35%, mod diff HK. Mild to Mod MR. Mildly dil LA; b. 08/2005 Cath: Nl Cors. Elevated CO w/o shunt; c. 09/2007 Echo: EF 45%. Mild to mod MR; c. 03/2015 Echo: EF 45%, global HK. Gr1 DD. Mild MR. Mildly dil LA.    Family History  Problem Relation Age of Onset   Cancer Mother    Heart disease Father     Past Surgical History:  Procedure Laterality Date   APPENDECTOMY     BACK SURGERY     CARDIOVERSION N/A 03/14/2018   Procedure: CARDIOVERSION;  Surgeon: Pricilla Riffle, MD;  Location[facility] ENDOSCOPY;  Service: Cardiovascular;  Laterality: N/A;   TEE WITHOUT CARDIOVERSION N/A 03/14/2018   Procedure: TRANSESOPHAGEAL ECHOCARDIOGRAM (TEE);  Surgeon: Pricilla Riffle, MD;  Location: [facility] ENDOSCOPY;  Service: Cardiovascular;  Laterality: N/A;   TEE WITHOUT CARDIOVERSION N/A 02/26/2022   Procedure: TRANSESOPHAGEAL ECHOCARDIOGRAM (TEE);  Surgeon: Dorthula Nettles, DO;  Location: [facility] ENDOSCOPY;  Service: Cardiovascular;  Laterality: N/A;   Social History   Occupational History   Occupation: unemployed  Comment: on disablity  Tobacco Use   Smoking status: Every Day    Current packs/day: 0.00    Average packs/day: 2.0 packs/day for 30.0 years (60.0 ttl pk-yrs)    Types: Cigarettes    Start date: 02/09/1988    Last attempt to quit: 02/08/2018    Years since quitting: 4.9   Smokeless tobacco: Never   Tobacco comments:    3 cigarettes daily 02/09/22  Vaping Use   Vaping status: Never Used  Substance and Sexual Activity   Alcohol use: No    Comment: quit 2 years ago   Drug use: No   Sexual activity: Not on file

## 2023-01-14 NOTE — Progress Notes (Signed)
ADVANCED HEART FAILURE CLINIC NOTE  Referring Physician: Marcine Matar, MD  Primary Care: Marcine Matar, MD Primary Cardiologist: Kristeen Miss  HPI: Joanna [name] is a 62 y.o. female with hypertension, long-term persistent atrial fibrillation and nonischemic cardiomyopathy with EF as low as 20 to 25% presenting today to establish care. Joanna [name] reports being diagnosed with heart failure for at least 10 years now with cath in 2007 demonstrating normal coronaries. She has had several admissions at [facility] dating back to 2020 when she required intubation for hypertensive urgency associated with pulmonary edema and atrial fibrillation. 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 During that admission, TTE w/ LVEF 25%-30% & severely dilated LA w/ severe eccentric MR. Since that time she has been seen in our [facility] clinic with addition of some GDMT.   Interval hx:  -She states over the past month she has had significant difficulty with ambulation due to pain in the right lower extremity. She believes these problems all started after taking amiodarone.  -Her breathing has improved significantly since improvement in her MR and escalation of GDMT.   Activity level/exercise tolerance:  NYHA IIB Orthopnea:  Sleeps on 2- Paroxysmal noctural dyspnea: No Chest pain/pressure:  No Orthostatic lightheadedness:  No Palpitations: No Lower extremity edema:  Minimal Presyncope/syncope:  No Cough:  No  Past Medical History:  Diagnosis Date   Atrial fibrillation with RVR (HCC)    Chronic combined systolic (congestive) and diastolic (congestive) heart failure (HCC)    Dyspnea    Goiter    Hypertension    NICM (nonischemic cardiomyopathy) (HCC)    a. 08/2005 Echo: EF 30-35%, mod diff HK. Mild to Mod MR. Mildly dil LA; b. 08/2005 Cath: Nl Cors. Elevated CO w/o shunt;  c. 09/2007 Echo: EF 45%. Mild to mod MR; c. 03/2015 Echo: EF 45%, global HK. Gr1 DD. Mild MR. Mildly dil LA.    Current Outpatient Medications  Medication Sig Dispense Refill   acetaminophen-codeine (TYLENOL #3) 300-30 MG tablet 1 po q 8 hrs prn pain 30 tablet 0   carvedilol (COREG) 3.125 MG tablet Take 1 tablet (3.125 mg total) by mouth 2 (two) times daily with a meal. 60 tablet 11   diazepam (VALIUM) 5 MG tablet Take one tablet by mouth with food one hour prior to procedure. May repeat 30 minutes prior if needed. 2 tablet 0   dofetilide (TIKOSYN) 125 MCG capsule TAKE 1 CAPSULE(125 MCG) BY MOUTH TWICE DAILY 60 capsule 1   ELIQUIS 5 MG TABS tablet TAKE 1 TABLET BY MOUTH TWICE  DAILY 200 tablet 2   furosemide (LASIX) 40 MG tablet TAKE 1 TABLET BY MOUTH DAILY 90 tablet 2   magnesium oxide (MAG-OX) 400 (240 Mg) MG tablet Take 1 tablet (400 mg total) by mouth daily. 30 tablet 3   methimazole (TAPAZOLE) 5 MG tablet Take 1 tablet (5 mg total) by mouth daily. 90 tablet 3   potassium chloride (KLOR-CON) 10 MEQ tablet Take 1 tablet (10 mEq total) by mouth daily. 90 tablet 3   sacubitril-valsartan (ENTRESTO) 97-103 MG TAKE 1 TABLET BY MOUTH TWICE  DAILY 200 tablet 3   spironolactone (ALDACTONE) 25 MG tablet TAKE 1/2 TABLET(12.5 MG) BY MOUTH DAILY 45 tablet 3   traMADol (ULTRAM) 50 MG tablet Take 1 tablet (50 mg total) by mouth every 12 (twelve) hours as needed. 20 tablet 0   No  current facility-administered medications for this visit.    No Known Allergies    Social History   Socioeconomic History   Marital status: Single    Spouse name: Not on file   Number of children: 1   Years of education: Not on file   Highest education level: High school graduate  Occupational History   Occupation: unemployed    Comment: on disablity  Tobacco Use   Smoking status: Every Day    Current packs/day: 0.00    Average packs/day: 2.0 packs/day for 30.0 years (60.0 ttl pk-yrs)    Types: Cigarettes    Start  date: 02/09/1988    Last attempt to quit: 02/08/2018    Years since quitting: 4.9   Smokeless tobacco: Never   Tobacco comments:    3 cigarettes daily 02/09/22  Vaping Use   Vaping status: Never Used  Substance and Sexual Activity   Alcohol use: No    Comment: quit 2 years ago   Drug use: No   Sexual activity: Not on file  Other Topics Concern   Not on file  Social History Narrative   Not on file   Social Determinants of Health   Financial Resource Strain: Low Risk  (07/17/2022)   Overall Financial Resource Strain (CARDIA)    Difficulty of Paying Living Expenses: Not hard at all  Food Insecurity: No Food Insecurity (07/17/2022)   Hunger Vital Sign    Worried About Running Out of Food in the Last Year: Never true    Ran Out of Food in the Last Year: Never true  Transportation Needs: No Transportation Needs (07/17/2022)   PRAPARE - Administrator, Civil Service (Medical): No    Lack of Transportation (Non-Medical): No  Physical Activity: Insufficiently Active (07/17/2022)   Exercise Vital Sign    Days of Exercise per Week: 3 days    Minutes of Exercise per Session: 30 min  Stress: No Stress Concern Present (07/17/2022)   [employer] of Occupational Health - Occupational Stress Questionnaire    Feeling of Stress : Not at all  Social Connections: Moderately Isolated (07/17/2022)   Social Connection and Isolation Panel [NHANES]    Frequency of Communication with Friends and Family: More than three times a week    Frequency of Social Gatherings with Friends and Family: Three times a week    Attends Religious Services: More than 4 times per year    Active Member of Clubs or Organizations: No    Attends Banker Meetings: Never    Marital Status: Never married  Intimate Partner Violence: Not At Risk (07/17/2022)   Humiliation, Afraid, Rape, and Kick questionnaire    Fear of Current or Ex-Partner: No    Emotionally Abused: No    Physically Abused: No     Sexually Abused: No      Family History  Problem Relation Age of Onset   Cancer Mother    Heart disease Father     PHYSICAL EXAM: There were no vitals filed for this visit. GENERAL: Well nourished, well developed, and in no apparent distress at rest.  HEENT: Negative for arcus senilis or xanthelasma. There is no scleral icterus.  The mucous membranes are pink and moist.   NECK: Supple, No masses. Normal carotid upstrokes without bruits. No masses or thyromegaly.    CHEST: There are no chest wall deformities. There is no chest wall tenderness. Respirations are unlabored.  Lungs- *** CARDIAC:  JVP: *** cm  Normal rate with regular rhythm. No murmurs, rubs or gallops.  Pulses are 2+ and symmetrical in upper and lower extremities. *** edema.  ABDOMEN: Soft, non-tender, non-distended. There are no masses or hepatomegaly. There are normal bowel sounds.  EXTREMITIES: Warm and well perfused with no cyanosis, clubbing.  LYMPHATIC: No axillary or supraclavicular lymphadenopathy.  NEUROLOGIC: Patient is oriented x3 with no focal or lateralizing neurologic deficits.  PSYCH: Patients affect is appropriate, there is no evidence of anxiety or depression.  SKIN: Warm and dry; no lesions or wounds.     DATA REVIEW  GQQ:PYPPJK fibrillation 02/12/21: Normal sinus rhythm  ECHO: 12/21/21: LVEF 25% to 30%. Severe eccentric MR.  10/04/20: LVEF 55% - 60% TEE: 02/26/22: LVEF 35%-40%, mild-mod MR.   ASSESSMENT & PLAN:  NYHA IIB, Stage C-D, Systolic Heart Failure Etiology of DT:OIZTIW nonischemic from persistent atrial fibrillation NYHA class / AHA Stage:IIB Volume status & Diuretics: Euvolemic, continue lasix 40mg  daily Vasodilators:Entresto 97/103mg  BID Beta-Blocker: Increase Coreg to 6.25 mg twice daily.  Previously decreased due to bradycardia. PYK:[id number] 12.5mg  daily Cardiometabolic:start farxiga 10mg  daily Devices therapies & Valvulopathies: Followed with EP, now on dofetilide  for rhythm control.  Improvement in MR after rhythm control. Repeat TTE at follow up. Advanced therapies:N/A  2. Severe mitral regurgitation  - Severe LA enlargement, likely atrial functional MR - Repeat TEE with improvement in MR after maintaining NSR; now mild-moderate MR  3. Persistent atrial fibrillation  -Now on Tikosyn, followed by Dr. Elberta Fortis.  In normal sinus rhythm at this time.    4. Amiodarone induced thyrotoxicosis - Follow with endocrinology  - Off amiodarone since 10/16/21 - s/p methimazole & prednisone  5. Severe low back pain / right leg pain  - Reports that she is unable to walk > 75ft due to 10/10pain sharp pain radiating down the RLE.  -Will refer to orthopedic/neurosurgery for further evaluation.  Would likely require MRI back.  Mordechai Matuszak Advanced Heart Failure Mechanical Circulatory Support

## 2023-01-15 ENCOUNTER — Encounter (HOSPITAL_COMMUNITY): Payer: Self-pay | Admitting: Cardiology

## 2023-01-15 ENCOUNTER — Ambulatory Visit (HOSPITAL_COMMUNITY)
Admission: RE | Admit: 2023-01-15 | Discharge: 2023-01-15 | Disposition: A | Discharge status: Home / Self Care | Payer: 59 | Source: Ambulatory Visit | Attending: Cardiology | Admitting: Cardiology

## 2023-01-15 VITALS — BP 118/82 | HR 72 | Wt 142.0 lb

## 2023-01-15 DIAGNOSIS — I5042 Chronic combined systolic (congestive) and diastolic (congestive) heart failure: Secondary | ICD-10-CM

## 2023-01-15 DIAGNOSIS — I4819 Other persistent atrial fibrillation: Secondary | ICD-10-CM | POA: Insufficient documentation

## 2023-01-15 DIAGNOSIS — I340 Nonrheumatic mitral (valve) insufficiency: Secondary | ICD-10-CM | POA: Diagnosis not present

## 2023-01-15 DIAGNOSIS — T462X5A Adverse effect of other antidysrhythmic drugs, initial encounter: Secondary | ICD-10-CM | POA: Diagnosis not present

## 2023-01-15 DIAGNOSIS — Z87891 Personal history of nicotine dependence: Secondary | ICD-10-CM | POA: Insufficient documentation

## 2023-01-15 DIAGNOSIS — M5459 Other low back pain: Secondary | ICD-10-CM | POA: Insufficient documentation

## 2023-01-15 DIAGNOSIS — M79604 Pain in right leg: Secondary | ICD-10-CM | POA: Insufficient documentation

## 2023-01-15 DIAGNOSIS — E0580 Other thyrotoxicosis without thyrotoxic crisis or storm: Secondary | ICD-10-CM | POA: Insufficient documentation

## 2023-01-15 DIAGNOSIS — I480 Paroxysmal atrial fibrillation: Secondary | ICD-10-CM | POA: Diagnosis not present

## 2023-01-15 DIAGNOSIS — I5022 Chronic systolic (congestive) heart failure: Secondary | ICD-10-CM | POA: Insufficient documentation

## 2023-01-15 DIAGNOSIS — Z7901 Long term (current) use of anticoagulants: Secondary | ICD-10-CM | POA: Insufficient documentation

## 2023-01-15 DIAGNOSIS — I428 Other cardiomyopathies: Secondary | ICD-10-CM | POA: Diagnosis not present

## 2023-01-15 LAB — BASIC METABOLIC PANEL
Anion gap: 8 (ref 5–15)
BUN: 23 mg/dL (ref 8–23)
CO2: 25 mmol/L (ref 22–32)
Calcium: 9.5 mg/dL (ref 8.9–10.3)
Chloride: 104 mmol/L (ref 98–111)
Creatinine, Ser: 1.11 mg/dL — ABNORMAL HIGH (ref 0.44–1.00)
GFR, Estimated: 56 mL/min — ABNORMAL LOW (ref >60)
Glucose, Bld: 100 mg/dL — ABNORMAL HIGH (ref 70–99)
Potassium: 4.2 mmol/L (ref 3.5–5.1)
Sodium: 137 mmol/L (ref 135–145)

## 2023-01-15 LAB — BRAIN NATRIURETIC PEPTIDE: B Natriuretic Peptide: 49.7 pg/mL (ref 0.0–100.0)

## 2023-01-15 MED ORDER — FUROSEMIDE 40 MG PO TABS: 20.0000 mg | ORAL_TABLET | Freq: Every day | ORAL | Status: DC

## 2023-01-15 NOTE — Patient Instructions (Signed)
Medication Changes:  DECREASE Furosemide to 20 mg (1/2 tab) Daily  Lab Work:  Labs done today, your results will be available in MyChart, we will contact you for abnormal readings.   Testing/Procedures:  Your physician has requested that you have an echocardiogram. Echocardiography is a painless test that uses sound waves to create images of your heart. It provides your doctor with information about the size and shape of your heart and how well your heart's chambers and valves are working. This procedure takes approximately one hour. There are no restrictions for this procedure. Please do NOT wear cologne, perfume, aftershave, or lotions (deodorant is allowed). Please arrive 15 minutes prior to your appointment time.  Please note: We ask at that you not bring children with you during ultrasound (echo/ vascular) testing. Due to room size and safety concerns, children are not allowed in the ultrasound rooms during exams. Our front office staff cannot provide observation of children in our lobby area while testing is being conducted. An adult accompanying a patient to their appointment will only be allowed in the ultrasound room at the discretion of the ultrasound technician under special circumstances. We apologize for any inconvenience.   Referrals:  none  Special Instructions // Education:  Do the following things EVERYDAY: Weigh yourself in the morning before breakfast. Write it down and keep it in a log. Take your medicines as prescribed Eat low salt foods--Limit salt (sodium) to 2000 mg per day.  Stay as active as you can everyday Limit all fluids for the day to less than 2 liters   Follow-Up in: 4 months (April 2025), **PLEASE CALL OUR OFFICE IN MARCH TO SCHEDULE THIS APPOINTMENT    At the [facility], you and your health needs are our priority. We have a designated team specialized in the treatment of Heart Failure. This Care Team includes your primary Heart  Failure Specialized Cardiologist (physician), Advanced Practice Providers (APPs- Physician Assistants and Nurse Practitioners), and Pharmacist who all work together to provide you with the care you need, when you need it.   You may see any of the following providers on your designated Care Team at your next follow up:  Dr. Arvilla Meres Dr. Marca Ancona Dr. Dorthula Nettles Dr. Theresia Bough Tonye Becket, NP Robbie Lis, [place] [facility] [place], [place] Brynda Peon, NP [place] Lee, NP Karle Plumber, PharmD   Please be sure to bring in all your medications bottles to every appointment.   Need to Contact us:  If you have any questions or concerns before your next appointment please send [place] a message through [place] or call our office at [phone].    TO LEAVE A MESSAGE FOR THE NURSE SELECT OPTION 2, PLEASE LEAVE A MESSAGE INCLUDING: YOUR NAME DATE OF BIRTH CALL BACK NUMBER REASON FOR CALL**this is important as we prioritize the call backs  YOU WILL RECEIVE A CALL BACK THE SAME DAY AS LONG AS YOU CALL BEFORE 4:00 PM

## 2023-01-28 ENCOUNTER — Other Ambulatory Visit (HOSPITAL_COMMUNITY): Payer: Self-pay | Admitting: Physician Assistant

## 2023-01-31 ENCOUNTER — Ambulatory Visit: Payer: 59 | Admitting: Internal Medicine

## 2023-02-04 ENCOUNTER — Other Ambulatory Visit (HOSPITAL_COMMUNITY): Payer: Self-pay | Admitting: *Deleted

## 2023-02-04 MED ORDER — DOFETILIDE 125 MCG PO CAPS: 125.0000 ug | ORAL_CAPSULE | Freq: Two times a day (BID) | ORAL | 3 refills | Status: DC

## 2023-02-07 ENCOUNTER — Ambulatory Visit (HOSPITAL_COMMUNITY)
Admission: RE | Admit: 2023-02-07 | Discharge: 2023-02-07 | Disposition: A | Discharge status: Home / Self Care | Payer: 59 | Source: Ambulatory Visit | Attending: Internal Medicine | Admitting: Internal Medicine

## 2023-02-07 DIAGNOSIS — I4891 Unspecified atrial fibrillation: Secondary | ICD-10-CM | POA: Insufficient documentation

## 2023-02-07 DIAGNOSIS — I083 Combined rheumatic disorders of mitral, aortic and tricuspid valves: Secondary | ICD-10-CM | POA: Insufficient documentation

## 2023-02-07 DIAGNOSIS — I5042 Chronic combined systolic (congestive) and diastolic (congestive) heart failure: Secondary | ICD-10-CM | POA: Diagnosis not present

## 2023-02-07 DIAGNOSIS — J449 Chronic obstructive pulmonary disease, unspecified: Secondary | ICD-10-CM | POA: Diagnosis not present

## 2023-02-07 DIAGNOSIS — I509 Heart failure, unspecified: Secondary | ICD-10-CM | POA: Diagnosis present

## 2023-02-07 DIAGNOSIS — I429 Cardiomyopathy, unspecified: Secondary | ICD-10-CM | POA: Diagnosis not present

## 2023-02-07 DIAGNOSIS — I3139 Other pericardial effusion (noninflammatory): Secondary | ICD-10-CM | POA: Diagnosis not present

## 2023-02-07 LAB — ECHOCARDIOGRAM COMPLETE
AR max vel: 2.22 cm2
AV Area VTI: 2.79 cm2
AV Area mean vel: 2.47 cm2
AV Mean grad: 2 mm[Hg]
AV Peak grad: 6.1 mm[Hg]
Ao pk vel: 1.23 m/s
Area-P 1/2: 2.43 cm2
Calc EF: 42.4 %
MV VTI: 3.65 cm2
S' Lateral: 3.8 cm
Single Plane A2C EF: 46.3 %
Single Plane A4C EF: 41.3 %

## 2023-02-07 NOTE — Progress Notes (Signed)
  Echocardiogram 2D Echocardiogram has been performed.  Ocie Doyne RDCS 02/07/2023, 2:48 PM

## 2023-02-09 ENCOUNTER — Encounter (HOSPITAL_COMMUNITY): Payer: Self-pay

## 2023-02-09 ENCOUNTER — Ambulatory Visit (HOSPITAL_COMMUNITY)
Admission: EM | Admit: 2023-02-09 | Discharge: 2023-02-09 | Disposition: A | Discharge status: Home / Self Care | Payer: 59 | Attending: Physician Assistant | Admitting: Physician Assistant

## 2023-02-09 DIAGNOSIS — R051 Acute cough: Secondary | ICD-10-CM | POA: Diagnosis not present

## 2023-02-09 DIAGNOSIS — K047 Periapical abscess without sinus: Secondary | ICD-10-CM

## 2023-02-09 MED ORDER — HYDROCODONE-ACETAMINOPHEN 5-325 MG PO TABS: 1.0000 | ORAL_TABLET | ORAL | 0 refills | Status: DC | PRN

## 2023-02-09 MED ORDER — CLINDAMYCIN HCL 300 MG PO CAPS: 300.0000 mg | ORAL_CAPSULE | Freq: Three times a day (TID) | ORAL | 0 refills | Status: AC

## 2023-02-09 NOTE — Discharge Instructions (Signed)
 Return if any problems.

## 2023-02-09 NOTE — ED Provider Notes (Signed)
 [facility]    CSN: [id number] Arrival date & time: 02/09/23  1626      History   Chief Complaint Chief Complaint  Patient presents with   Cough   Dental Pain    HPI Joanna [name] is a 63 y.o. female.   Patient complains of swelling to a tooth in her left lower mouth.  Patient also reports she has had a cough and congestion.  Patient's family member helps with her history and states that they plan to take the patient to see a dentist on Monday.  They brought patient in today because of pain.  Patient has not had any fever or chills she is not having any shortness of breath.  She is not currently having any swelling.  They deny any symptoms of congestive heart failure.  The history is provided by the patient. No language interpreter was used.  Cough Dental Pain   Past Medical History:  Diagnosis Date   Atrial fibrillation with RVR (HCC)    Chronic combined systolic (congestive) and diastolic (congestive) heart failure (HCC)    Dyspnea    Goiter    Hypertension    NICM (nonischemic cardiomyopathy) (HCC)    a. 08/2005 Echo: EF 30-35%, mod diff HK. Mild to Mod MR. Mildly dil LA; b. 08/2005 Cath: Nl Cors. Elevated CO w/o shunt; c. 09/2007 Echo: EF 45%. Mild to mod MR; c. 03/2015 Echo: EF 45%, global HK. Gr1 DD. Mild MR. Mildly dil LA.    Patient Active Problem List   Diagnosis Date Noted   Nerve damage 08/22/2022   Encounter for monitoring dofetilide  therapy 07/16/2022   Persistent atrial fibrillation (HCC) 01/16/2022   Hypercoagulable state due to persistent atrial fibrillation (HCC) 01/16/2022   Severe mitral regurgitation 01/15/2022   Chronic systolic heart failure (HCC) 01/15/2022   Amiodarone -induced thyroiditis 12/22/2021   Chronic combined systolic and diastolic CHF (congestive heart failure) (HCC) 09/07/2019   Former smoker 03/17/2018   Essential hypertension 03/17/2018   Atrial fibrillation with RVR (HCC) 03/08/2018   Accelerated hypertension 03/08/2018    Hyperglycemia 03/08/2018   Acute systolic (congestive) heart failure (HCC)    Hypertensive urgency 04/11/2015   COPD (chronic obstructive pulmonary disease) (HCC) 04/01/2015    Past Surgical History:  Procedure Laterality Date   APPENDECTOMY     BACK SURGERY     CARDIOVERSION N/A 03/14/2018   Procedure: CARDIOVERSION;  Surgeon: Okey Vina GAILS, MD;  Location: [facility] ENDOSCOPY;  Service: Cardiovascular;  Laterality: N/A;   TEE WITHOUT CARDIOVERSION N/A 03/14/2018   Procedure: TRANSESOPHAGEAL ECHOCARDIOGRAM (TEE);  Surgeon: Okey Vina GAILS, MD;  Location: [facility] ENDOSCOPY;  Service: Cardiovascular;  Laterality: N/A;   TEE WITHOUT CARDIOVERSION N/A 02/26/2022   Procedure: TRANSESOPHAGEAL ECHOCARDIOGRAM (TEE);  Surgeon: Gardenia Led, DO;  Location: [facility] ENDOSCOPY;  Service: Cardiovascular;  Laterality: N/A;    OB History   No obstetric history on file.      Home Medications    Prior to Admission medications   Medication Sig Start Date End Date Taking? Authorizing Provider  clindamycin  (CLEOCIN ) 300 MG capsule Take 1 capsule (300 mg total) by mouth 3 (three) times daily for 10 days. 02/09/23 02/19/23 Yes Leila Schuff K, PA-C  HYDROcodone -acetaminophen  (NORCO/VICODIN) 5-325 MG tablet Take 1 tablet by mouth every 4 (four) hours as needed for moderate pain (pain score 4-6). 02/09/23 02/09/24 Yes Karrissa Parchment K, PA-C  acetaminophen -codeine  (TYLENOL  #3) 300-30 MG tablet 1 po q 8 hrs prn pain 01/09/23   Addie Cordella Hamilton, MD  carvedilol  (COREG ) 3.125 MG tablet Take 1 tablet (3.125 mg total) by mouth 2 (two) times daily with a meal. 11/07/22   Milford, Harlene HERO, FNP  diazepam  (VALIUM ) 5 MG tablet Take one tablet by mouth with food one hour prior to procedure. May repeat 30 minutes prior if needed. 10/26/22   Williams, Megan E, NP  dofetilide  (TIKOSYN ) 125 MCG capsule Take 1 capsule (125 mcg total) by mouth 2 (two) times daily. 02/04/23   Terra Fairy PARAS, PA-C  ELIQUIS  5 MG TABS tablet TAKE 1 TABLET BY MOUTH  TWICE  DAILY 07/09/22   Nahser, Aleene PARAS, MD  furosemide  (LASIX ) 40 MG tablet Take 0.5 tablets (20 mg total) by mouth daily. 01/15/23   Sabharwal, Aditya, DO  magnesium  oxide (MAG-OX) 400 (240 Mg) MG tablet Take 1 tablet (400 mg total) by mouth daily. 11/16/22   Terra Fairy PARAS, PA-C  methimazole  (TAPAZOLE ) 5 MG tablet Take 1 tablet (5 mg total) by mouth daily. 07/19/22   Nahser, Aleene PARAS, MD  potassium chloride  (KLOR-CON ) 10 MEQ tablet TAKE 1 TABLET(10 MEQ) BY MOUTH DAILY 01/28/23   Terra Fairy PARAS, PA-C  sacubitril -valsartan  (ENTRESTO ) 97-103 MG TAKE 1 TABLET BY MOUTH TWICE  DAILY 09/18/22   Sabharwal, Aditya, DO  spironolactone  (ALDACTONE ) 25 MG tablet TAKE 1/2 TABLET(12.5 MG) BY MOUTH DAILY 07/04/22   Marcine Caffie HERO, PA-C    Family History Family History  Problem Relation Age of Onset   Cancer Mother    Heart disease Father     Social History Social History   Tobacco Use   Smoking status: Every Day    Current packs/day: 0.00    Average packs/day: 2.0 packs/day for 30.0 years (60.0 ttl pk-yrs)    Types: Cigarettes    Start date: 02/09/1988    Last attempt to quit: 02/08/2018    Years since quitting: 5.0   Smokeless tobacco: Never   Tobacco comments:    3 cigarettes daily 02/09/22  Vaping Use   Vaping status: Never Used  Substance Use Topics   Alcohol use: No    Comment: quit 2 years ago   Drug use: No     Allergies   Patient has no known allergies.   Review of Systems Review of Systems  HENT:  Positive for dental problem.   Respiratory:  Positive for cough.   All other systems reviewed and are negative.    Physical Exam Triage Vital Signs ED Triage Vitals  Encounter Vitals Group     BP 02/09/23 1701 (!) 89/63     Systolic BP Percentile --      Diastolic BP Percentile --      Pulse Rate 02/09/23 1701 88     Resp 02/09/23 1701 16     Temp 02/09/23 1701 98.3 F (36.8 C)     Temp Source 02/09/23 1701 Oral     SpO2 02/09/23 1701 93 %     Weight --       Height --      Head Circumference --      Peak Flow --      Pain Score 02/09/23 1700 9     Pain Loc --      Pain Education --      Exclude from Growth Chart --    No data found.  Updated Vital Signs BP (!) 89/63 (BP Location: Right Arm)   Pulse 88   Temp 98.3 F (36.8 C) (Oral)   Resp 16   LMP 06/10/2011  SpO2 93%   Visual Acuity Right Eye Distance:   Left Eye Distance:   Bilateral Distance:    Right Eye Near:   Left Eye Near:    Bilateral Near:     Physical Exam Vitals and nursing note reviewed.  Constitutional:      Appearance: She is well-developed.  HENT:     Head: Normocephalic.     Comments: Swollen left lower jaw, swelling left lower mouth.  Ludwick's is negative Pulmonary:     Effort: Pulmonary effort is normal.  Abdominal:     General: There is no distension.  Musculoskeletal:        General: Normal range of motion.     Cervical back: Normal range of motion.  Skin:    General: Skin is warm.  Neurological:     Mental Status: She is alert and oriented to person, place, and time.      UC Treatments / Results  Labs (all labs ordered are listed, but only abnormal results are displayed) Labs Reviewed - No data to display  EKG   Radiology No results found.  Procedures Procedures (including critical care time)  Medications Ordered in UC Medications - No data to display  Initial Impression / Assessment and Plan / UC Course  I have reviewed the triage vital signs and the nursing notes.  Pertinent labs & imaging results that were available during my care of the patient were reviewed by me and considered in my medical decision making (see chart for details).     Patient's family given the phone number for Dr. Maryruth dentist I will put the patient on clindamycin .  Patient is given a prescription for hydrocodone  for her discomfort. Final Clinical Impressions(s) / UC Diagnoses   Final diagnoses:  Dental abscess  Acute cough     Discharge  Instructions      Return if any problems.    ED Prescriptions     Medication Sig Dispense Auth. Provider   clindamycin  (CLEOCIN ) 300 MG capsule Take 1 capsule (300 mg total) by mouth 3 (three) times daily for 10 days. 30 capsule Dawn Convery K, PA-C   HYDROcodone -acetaminophen  (NORCO/VICODIN) 5-325 MG tablet Take 1 tablet by mouth every 4 (four) hours as needed for moderate pain (pain score 4-6). 12 tablet Hollis Oh K, PA-C      I have reviewed the PDMP during this encounter. An After Visit Summary was printed and given to the patient.       Flint Sonny POUR, PA-C 02/09/23 1757

## 2023-02-09 NOTE — ED Triage Notes (Signed)
 Patient here today with c/o productive cough and runny nose X 4-5 days. No known sick contacts.   She is also c/o left side dental pain since before Christmas. Patient states that she cannot eat on that side. She has been taking Tylenol with no relief.

## 2023-02-19 ENCOUNTER — Ambulatory Visit (HOSPITAL_COMMUNITY): Payer: 59 | Admitting: Internal Medicine

## 2023-02-28 ENCOUNTER — Telehealth (HOSPITAL_COMMUNITY): Payer: Self-pay | Admitting: Cardiology

## 2023-02-28 NOTE — Telephone Encounter (Signed)
  [facility]   Pre-operative Risk Assessment    Request for Surgical Clearance{  Procedure:   Deep cleaning- needed by periodontics  { Date of Surgery:  Clearance TBD                             {  Surgeon: Not listed  Surgeon's Group or Practice Name:  [facility]  Fax number:  Fax # [phone]  { - Medical  - Pharmacy:  Hold Apixaban (Eliquis) NEEDS INSTRUCTION ON IF THIS NEEDS TO BE HELD  { Type of Anesthesia:  Not Indicated   Additional requests/questions:  Please fax a copy of CLEARANCE AND ELIQUIS INSTRUCTIONS to the surgeon's office.  Signed, Cleotilde Neer Atalie Oros   02/28/2023, 12:51 PM   [facility]

## 2023-02-28 NOTE — Telephone Encounter (Signed)
[facility] called for cardiac clearance for upcoming dental procedure (reports fax option is not available)  -deep cleaning is needed via periodontics   Pt is currently on blood thinner, will need instructions on how to proceeded  Fax # [phone]  Message to [place], RN to assist with clearance

## 2023-03-01 NOTE — Telephone Encounter (Signed)
[place], [place], FNP  You1 hour ago (8:04 AM)    OK for deep dental cleaning from CV standpoint, no need to hold Eliquis beforehand   Clearance faxed over via [employer]

## 2023-03-03 ENCOUNTER — Inpatient Hospital Stay (HOSPITAL_COMMUNITY)
Admission: EM | Admit: 2023-03-03 | Discharge: 2023-03-09 | DRG: 853 | Disposition: A | Discharge status: Home / Self Care | Payer: 59 | Attending: Family Medicine | Admitting: Family Medicine

## 2023-03-03 ENCOUNTER — Emergency Department (HOSPITAL_COMMUNITY): Payer: 59

## 2023-03-03 DIAGNOSIS — F17210 Nicotine dependence, cigarettes, uncomplicated: Secondary | ICD-10-CM | POA: Diagnosis present

## 2023-03-03 DIAGNOSIS — Z9049 Acquired absence of other specified parts of digestive tract: Secondary | ICD-10-CM

## 2023-03-03 DIAGNOSIS — E872 Acidosis, unspecified: Secondary | ICD-10-CM | POA: Diagnosis present

## 2023-03-03 DIAGNOSIS — I5042 Chronic combined systolic (congestive) and diastolic (congestive) heart failure: Secondary | ICD-10-CM | POA: Diagnosis present

## 2023-03-03 DIAGNOSIS — I428 Other cardiomyopathies: Secondary | ICD-10-CM | POA: Diagnosis present

## 2023-03-03 DIAGNOSIS — K029 Dental caries, unspecified: Secondary | ICD-10-CM | POA: Diagnosis present

## 2023-03-03 DIAGNOSIS — I480 Paroxysmal atrial fibrillation: Secondary | ICD-10-CM | POA: Diagnosis present

## 2023-03-03 DIAGNOSIS — E0500 Thyrotoxicosis with diffuse goiter without thyrotoxic crisis or storm: Secondary | ICD-10-CM | POA: Diagnosis present

## 2023-03-03 DIAGNOSIS — E861 Hypovolemia: Secondary | ICD-10-CM | POA: Diagnosis present

## 2023-03-03 DIAGNOSIS — N179 Acute kidney failure, unspecified: Secondary | ICD-10-CM | POA: Diagnosis present

## 2023-03-03 DIAGNOSIS — Z7901 Long term (current) use of anticoagulants: Secondary | ICD-10-CM | POA: Diagnosis not present

## 2023-03-03 DIAGNOSIS — J440 Chronic obstructive pulmonary disease with acute lower respiratory infection: Secondary | ICD-10-CM | POA: Diagnosis present

## 2023-03-03 DIAGNOSIS — I70219 Atherosclerosis of native arteries of extremities with intermittent claudication, unspecified extremity: Secondary | ICD-10-CM | POA: Diagnosis not present

## 2023-03-03 DIAGNOSIS — A419 Sepsis, unspecified organism: Secondary | ICD-10-CM | POA: Diagnosis present

## 2023-03-03 DIAGNOSIS — E860 Dehydration: Secondary | ICD-10-CM | POA: Diagnosis present

## 2023-03-03 DIAGNOSIS — Z7984 Long term (current) use of oral hypoglycemic drugs: Secondary | ICD-10-CM

## 2023-03-03 DIAGNOSIS — Z8249 Family history of ischemic heart disease and other diseases of the circulatory system: Secondary | ICD-10-CM

## 2023-03-03 DIAGNOSIS — Z79899 Other long term (current) drug therapy: Secondary | ICD-10-CM

## 2023-03-03 DIAGNOSIS — I70221 Atherosclerosis of native arteries of extremities with rest pain, right leg: Secondary | ICD-10-CM | POA: Diagnosis present

## 2023-03-03 DIAGNOSIS — I739 Peripheral vascular disease, unspecified: Secondary | ICD-10-CM | POA: Diagnosis not present

## 2023-03-03 DIAGNOSIS — Z560 Unemployment, unspecified: Secondary | ICD-10-CM

## 2023-03-03 DIAGNOSIS — J189 Pneumonia, unspecified organism: Secondary | ICD-10-CM | POA: Diagnosis present

## 2023-03-03 DIAGNOSIS — R570 Cardiogenic shock: Secondary | ICD-10-CM | POA: Diagnosis present

## 2023-03-03 DIAGNOSIS — D7389 Other diseases of spleen: Secondary | ICD-10-CM | POA: Diagnosis present

## 2023-03-03 DIAGNOSIS — Z8619 Personal history of other infectious and parasitic diseases: Secondary | ICD-10-CM

## 2023-03-03 DIAGNOSIS — R6520 Severe sepsis without septic shock: Secondary | ICD-10-CM | POA: Diagnosis not present

## 2023-03-03 DIAGNOSIS — M79671 Pain in right foot: Secondary | ICD-10-CM | POA: Diagnosis present

## 2023-03-03 DIAGNOSIS — E278 Other specified disorders of adrenal gland: Secondary | ICD-10-CM | POA: Diagnosis present

## 2023-03-03 DIAGNOSIS — Z7902 Long term (current) use of antithrombotics/antiplatelets: Secondary | ICD-10-CM

## 2023-03-03 DIAGNOSIS — I110 Hypertensive heart disease with heart failure: Secondary | ICD-10-CM | POA: Diagnosis present

## 2023-03-03 DIAGNOSIS — I9589 Other hypotension: Secondary | ICD-10-CM | POA: Diagnosis present

## 2023-03-03 DIAGNOSIS — Z809 Family history of malignant neoplasm, unspecified: Secondary | ICD-10-CM

## 2023-03-03 DIAGNOSIS — I7090 Unspecified atherosclerosis: Secondary | ICD-10-CM | POA: Diagnosis not present

## 2023-03-03 DIAGNOSIS — Z9889 Other specified postprocedural states: Secondary | ICD-10-CM

## 2023-03-03 DIAGNOSIS — Z5982 Transportation insecurity: Secondary | ICD-10-CM

## 2023-03-03 LAB — COMPREHENSIVE METABOLIC PANEL
ALT: 8 U/L (ref 0–44)
AST: 12 U/L — ABNORMAL LOW (ref 15–41)
Albumin: 3.1 g/dL — ABNORMAL LOW (ref 3.5–5.0)
Alkaline Phosphatase: 54 U/L (ref 38–126)
Anion gap: 16 — ABNORMAL HIGH (ref 5–15)
BUN: 26 mg/dL — ABNORMAL HIGH (ref 8–23)
CO2: 14 mmol/L — ABNORMAL LOW (ref 22–32)
Calcium: 9 mg/dL (ref 8.9–10.3)
Chloride: 108 mmol/L (ref 98–111)
Creatinine, Ser: 1.32 mg/dL — ABNORMAL HIGH (ref 0.44–1.00)
GFR, Estimated: 45 mL/min — ABNORMAL LOW (ref >60)
Glucose, Bld: 98 mg/dL (ref 70–99)
Potassium: 4.7 mmol/L (ref 3.5–5.1)
Sodium: 138 mmol/L (ref 135–145)
Total Bilirubin: 0.8 mg/dL (ref 0.0–1.2)
Total Protein: 6.8 g/dL (ref 6.5–8.1)

## 2023-03-03 LAB — PROTIME-INR
INR: 1.5 — ABNORMAL HIGH (ref 0.8–1.2)
Prothrombin Time: 17.9 s — ABNORMAL HIGH (ref 11.4–15.2)

## 2023-03-03 LAB — RESP PANEL BY RT-PCR (RSV, FLU A&B, COVID)  RVPGX2
Influenza A by PCR: NEGATIVE
Influenza B by PCR: NEGATIVE
Resp Syncytial Virus by PCR: NEGATIVE
SARS Coronavirus 2 by RT PCR: NEGATIVE

## 2023-03-03 LAB — I-STAT CG4 LACTIC ACID, ED
Lactic Acid, Venous: 3.2 mmol/L (ref 0.5–1.9)
Lactic Acid, Venous: 1.4 mmol/L (ref 0.5–1.9)

## 2023-03-03 LAB — CBC WITH DIFFERENTIAL/PLATELET
Abs Immature Granulocytes: 0.04 10*3/uL (ref 0.00–0.07)
Basophils Absolute: 0 10*3/uL (ref 0.0–0.1)
Basophils Relative: 0 %
Eosinophils Absolute: 0.1 10*3/uL (ref 0.0–0.5)
Eosinophils Relative: 1 %
HCT: 40.6 % (ref 36.0–46.0)
Hemoglobin: 11.8 g/dL — ABNORMAL LOW (ref 12.0–15.0)
Immature Granulocytes: 0 %
Lymphocytes Relative: 15 %
Lymphs Abs: 1.4 10*3/uL (ref 0.7–4.0)
MCH: 27.8 pg (ref 26.0–34.0)
MCHC: 29.1 g/dL — ABNORMAL LOW (ref 30.0–36.0)
MCV: 95.8 fL (ref 80.0–100.0)
Monocytes Absolute: 0.5 10*3/uL (ref 0.1–1.0)
Monocytes Relative: 6 %
Neutro Abs: 7.2 10*3/uL (ref 1.7–7.7)
Neutrophils Relative %: 78 %
Platelets: 336 10*3/uL (ref 150–400)
RBC: 4.24 MIL/uL (ref 3.87–5.11)
RDW: 13.4 % (ref 11.5–15.5)
WBC: 9.3 10*3/uL (ref 4.0–10.5)
nRBC: 0 % (ref 0.0–0.2)

## 2023-03-03 LAB — BETA-HYDROXYBUTYRIC ACID: Beta-Hydroxybutyric Acid: 0.87 mmol/L — ABNORMAL HIGH (ref 0.05–0.27)

## 2023-03-03 LAB — APTT: aPTT: 32 s (ref 24–36)

## 2023-03-03 LAB — TROPONIN I (HIGH SENSITIVITY): Troponin I (High Sensitivity): 12 ng/L (ref <18)

## 2023-03-03 LAB — BRAIN NATRIURETIC PEPTIDE: B Natriuretic Peptide: 69.2 pg/mL (ref 0.0–100.0)

## 2023-03-03 MED ORDER — VANCOMYCIN HCL 1250 MG/250ML IV SOLN
1250.0000 mg | Freq: Once | INTRAVENOUS | Status: AC
  Administered 2023-03-03: 1250 mg via INTRAVENOUS
  Filled 2023-03-03: qty 250

## 2023-03-03 MED ORDER — LACTATED RINGERS IV BOLUS
500.0000 mL | Freq: Once | INTRAVENOUS | Status: AC
  Administered 2023-03-03: 500 mL via INTRAVENOUS

## 2023-03-03 MED ORDER — ACETAMINOPHEN 650 MG RE SUPP: 650.0000 mg | Freq: Four times a day (QID) | RECTAL | Status: DC | PRN

## 2023-03-03 MED ORDER — METHIMAZOLE 5 MG PO TABS
5.0000 mg | ORAL_TABLET | Freq: Every day | ORAL | Status: DC
  Administered 2023-03-03 – 2023-03-09 (×7): 5 mg via ORAL
  Filled 2023-03-03 (×7): qty 1

## 2023-03-03 MED ORDER — VANCOMYCIN HCL IN DEXTROSE 1-5 GM/200ML-% IV SOLN
1000.0000 mg | INTRAVENOUS | Status: DC
  Administered 2023-03-04 – 2023-03-05 (×2): 1000 mg via INTRAVENOUS
  Filled 2023-03-03 (×2): qty 200

## 2023-03-03 MED ORDER — ONDANSETRON HCL 4 MG/2ML IJ SOLN
4.0000 mg | Freq: Four times a day (QID) | INTRAMUSCULAR | Status: DC | PRN
Start: 2023-03-03 — End: 2023-03-04

## 2023-03-03 MED ORDER — APIXABAN 5 MG PO TABS
5.0000 mg | ORAL_TABLET | Freq: Two times a day (BID) | ORAL | Status: DC
  Administered 2023-03-03 – 2023-03-05 (×4): 5 mg via ORAL
  Filled 2023-03-03 (×4): qty 1

## 2023-03-03 MED ORDER — LACTATED RINGERS IV SOLN: INTRAVENOUS | Status: AC

## 2023-03-03 MED ORDER — HYDROCODONE-ACETAMINOPHEN 5-325 MG PO TABS
1.0000 | ORAL_TABLET | Freq: Once | ORAL | Status: AC
  Administered 2023-03-03: 1 via ORAL
  Filled 2023-03-03: qty 1

## 2023-03-03 MED ORDER — ACETAMINOPHEN 325 MG PO TABS
650.0000 mg | ORAL_TABLET | Freq: Four times a day (QID) | ORAL | Status: DC | PRN
Start: 2023-03-03 — End: 2023-03-09
  Administered 2023-03-04 – 2023-03-08 (×7): 650 mg via ORAL
  Filled 2023-03-03 (×7): qty 2

## 2023-03-03 MED ORDER — ONDANSETRON HCL 4 MG PO TABS
4.0000 mg | ORAL_TABLET | Freq: Four times a day (QID) | ORAL | Status: DC | PRN
Start: 2023-03-03 — End: 2023-03-04

## 2023-03-03 MED ORDER — SODIUM CHLORIDE 0.9 % IV SOLN
2.0000 g | Freq: Two times a day (BID) | INTRAVENOUS | Status: DC
  Administered 2023-03-03 – 2023-03-06 (×6): 2 g via INTRAVENOUS
  Filled 2023-03-03 (×6): qty 12.5

## 2023-03-03 NOTE — ED Triage Notes (Signed)
Pt to the ed from home via ems  with a CC of weakness, fatigue x 1 month, Pt has been bed bond the last few days. Pt relay she was being treated for a dental infection. Pt also relays she is having foot pain.

## 2023-03-03 NOTE — ED Provider Notes (Signed)
[place] EMERGENCY DEPARTMENT AT [facility] Provider Note  MDM   HPI/ROS:  Joanna [name] is a 63 y.o. female with pertinent past medical history of COPD, HFrEF, A-fib RVR, HTN, thyroiditis who presents for evaluation of fatigue and hypotension. Patient reports 1 month history of worsening fatigue with states that today she became acutely lightheaded and could not get out of bed, which prompted family to call EMS.  She was seen at urgent care on 1/4 for pain in tooth of her left lower mouth at which time she was started on treatment with clindamycin for a likely dental abscess.  She states she has completed this prescription as prescribed.  She reports ongoing subjective fevers and chills.  She additionally reports over the past week she has had worsening right sided foot pain and feels that foot is cooler than the other.  She denies any known trauma.  She reports poor p.o. intake because she is too weak to get up.  She was reportedly hypotensive to the 34s with EMS so she was given 300 cc IV fluid with improvement to the 100/60s  Physical exam is notable for: - Appears dehydrated with dry, cracked lips, dry mucous membranes, mildly prolonged capillary refill -- Extremities mildly cool to touch with intact distal pulses -- Tenderness to palpation diffusely over the right foot most prominent over the base of the first metatarsal without obvious deformity, swelling, ecchymosis -- Numerous dental caries without obvious dental abscess, facial swelling  On my initial evaluation, patient is:  -Mildly hypertensive with blood pressures in the 80s-90s/50s-60s though otherwise stable vital signs. Patient afebrile, confirmed by rectal temperature. -Appears chronically ill but non-toxic appearing. -Additional history obtained from EMS report, review prior records -Per chart review, blood pressure 89/63 at recent urgent care visit on 02/09/2023, however prior to this time was  normotensive  Overall, suspect etiology of patient's hypotension most likely due to hypovolemia in the setting of poor p.o. intake.  Lower suspicion for sepsis/septic shock as patient afebrile with no obvious sources of infection, however will obtain concurrent infectious workup given patient does report subjective fevers at home and had recent dental abscess.  Also considered cardiogenic shock particularly with patient's history of HFrEF, however clinically she appears hypovolemic and blood pressure responsive to IV fluids which is more consistent with hypovolemia.  Regarding her foot pain, no obvious signs of trauma and does not appear consistent with gout, septic arthritis, necrotizing skin infection, cellulitis.  Considered possible ischemic limb/claudication given multiple cavities and mildly cool extremities, however skin does not appear mottled and has palpable distal pulses with intact Doppler flow.   Interpretations, interventions, and the patient's course of care are documented below.   -Labs reviewed: WBC 9.3, hemoglobin 11.8, creatinine 1.32, BUN 26, CO2 14, AG 16, lactic acid 3.2 > 1.4, beta hydroxybutyrate 0.87, BNP 69.2, troponin 12 -Imaging reviewed: Chest x-ray with stable cardiomegaly, no significant pulmonary edema or focal consolidation -Right foot x-ray with no acute fracture or dislocation  Presentation is overall consistent with hypotension and hypovolemia due to dehydration and poor p.o. intake.  Patient with initial mild lactic acidosis that improved after fluid resuscitation and mildly elevated beta hydroxybutyrate consistent with likely component of starvation ketosis.  BNP negative, reassuring against heart failure exacerbation and patient without leukocytosis or other findings on exam or workup that would raise suspicion for sepsis.  Creatinine is mildly elevated though relatively stable from prior.  Patient will require admission for further management of hypotension due to  dehydration.  She will be admitted to the hospitalist service.  Please see their note for further treatment plan details.  Patient remained stable and had no acute events while in my care in the emergency department.  Disposition:  I discussed the case with Dr. Avie Arenas with the hospitalist service who graciously agreed to admit the patient to their service for continued care.    Clinical Impression:  1. Hypotension due to hypovolemia     Rx / DC Orders ED Discharge Orders     None       The plan for this patient was discussed with Dr. Doran Durand, who voiced agreement and who oversaw evaluation and treatment of this patient.   Clinical Complexity A medically appropriate history, review of systems, and physical exam was performed.  My independent interpretations of EKG, labs, and radiology are documented in the ED course above.   If decision rules were used in this patient's evaluation, they are listed below.   Patient's presentation is most consistent with acute presentation with potential threat to life or bodily function.  Medical Decision Making Amount and/or Complexity of Data Reviewed Labs: ordered. Radiology: ordered.  Risk Prescription drug management. Decision regarding hospitalization.    HPI/ROS      See MDM section for pertinent HPI and ROS. A complete ROS was performed with pertinent positives/negatives noted above.   Past Medical History:  Diagnosis Date   Atrial fibrillation with RVR (HCC)    Chronic combined systolic (congestive) and diastolic (congestive) heart failure (HCC)    Dyspnea    Goiter    Hypertension    NICM (nonischemic cardiomyopathy) (HCC)    a. 08/2005 Echo: EF 30-35%, mod diff HK. Mild to Mod MR. Mildly dil LA; b. 08/2005 Cath: Nl Cors. Elevated CO w/o shunt; c. 09/2007 Echo: EF 45%. Mild to mod MR; c. 03/2015 Echo: EF 45%, global HK. Gr1 DD. Mild MR. Mildly dil LA.    Past Surgical History:  Procedure Laterality Date   APPENDECTOMY      BACK SURGERY     CARDIOVERSION N/A 03/14/2018   Procedure: CARDIOVERSION;  Surgeon: Pricilla Riffle, MD;  Location[facility] ENDOSCOPY;  Service: Cardiovascular;  Laterality: N/A;   TEE WITHOUT CARDIOVERSION N/A 03/14/2018   Procedure: TRANSESOPHAGEAL ECHOCARDIOGRAM (TEE);  Surgeon: Pricilla Riffle, MD;  Location: [facility] ENDOSCOPY;  Service: Cardiovascular;  Laterality: N/A;   TEE WITHOUT CARDIOVERSION N/A 02/26/2022   Procedure: TRANSESOPHAGEAL ECHOCARDIOGRAM (TEE);  Surgeon: Dorthula Nettles, DO;  Location: [facility] ENDOSCOPY;  Service: Cardiovascular;  Laterality: N/A;      Physical Exam   ED Triage Vitals  Encounter Vitals Group     BP 03/03/23 1658 (!) 85/60     Systolic BP Percentile --      Diastolic BP Percentile --      Pulse Rate 03/03/23 1658 81     Resp 03/03/23 1658 16     Temp 03/03/23 1658 (!) 97.3 F (36.3 C)     Temp Source 03/03/23 1658 Oral     SpO2 03/03/23 1658 100 %     Weight 03/03/23 1659 141 lb 1.5 oz (64 kg)     Height 03/03/23 1659 6' (1.829 m)     Head Circumference --      Peak Flow --      Pain Score 03/03/23 1659 5     Pain Loc --      Pain Education --      Exclude from Growth Chart --  Physical Exam Gen: NAD. Appears comfortable HENT: Conjunctiva clear, PERRL, EOMI. dry mucous membranes.  Dry, cracked lips.  Numerous dental caries with some discoloration of patient's gums, no swelling, active drainage noted. CV: RRR. No M/R/G Pulm: Lungs CTAB with no wheezing, rales, or rhonchi.  GI: Abdomen soft, non-tender, non-distended. Normal bowel sounds in all 4 quadrants. MSK/Skin: No lower extremity edema.  Extremities mildly cool to touch though with intact distal pulses.  Capillary refill 3-4 seconds.  Tenderness to palpation predominantly over the dorsal aspect of the right medial midfoot.  No associated ecchymosis, erythema, swelling. Neuro: A&Ox3. GCS 15. Moves all extremities.     Procedures   If procedures were preformed on this patient, they are listed  below:  Procedures   Mikeal Hawthorne, MD Emergency Medicine PGY-2   Please note that this documentation was produced with the assistance of voice-to-text technology and may contain errors.    Mikeal Hawthorne, MD 03/04/23 Pernell Dupre    Glyn Ade, MD 03/04/23 1505

## 2023-03-03 NOTE — Progress Notes (Signed)
Pharmacy Antibiotic Note  Joanna [name] is a 63 y.o. female admitted on 03/03/2023 presenting with worsening fatigue, recent dental abscess, concern for sepsis.  Pharmacy has been consulted for vancomycin and cefepime dosing.  Plan: Vancomycin 1250 mg IV x 1, then 1g IV q 24h (eAUC 529) Cefepime 2g IV q 12h Monitor renal function, Cx and clinical progression to narrow Vancomycin levels as needed    Height: 6' (182.9 cm) Weight: 64 kg (141 lb 1.5 oz) IBW/kg (Calculated) : 73.1  Temp (24hrs), Avg:98 F (36.7 C), Min:97.3 F (36.3 C), Max:98.6 F (37 C)  Recent Labs  Lab 03/03/23 1703 03/03/23 1722 03/03/23 1938  WBC 9.3  --   --   CREATININE 1.32*  --   --   LATICACIDVEN  --  3.2* 1.4    Estimated Creatinine Clearance: 44.1 mL/min (A) (by C-G formula based on SCr of 1.32 mg/dL (H)).    No Known Allergies  Daylene Posey, PharmD, [facility] Clinical Pharmacist ED Pharmacist Phone # [phone] 03/03/2023 9:22 PM

## 2023-03-03 NOTE — H&P (Signed)
History and Physical    Damon Baisch GNF:[id number] DOB: [birth year] DOA: 03/03/2023  PCP: Marcine Matar, MD   Chief Complaint:  weakness, fatigue  HPI: Joanna [name] is a 63 y.o. female with medical history significant of A-fib, CHF, hypertension who presented to the emergency department due to 1 month of fatigue, lightheadedness.  Patient was recently treated for a dental infection.  She completed the prescription however developed fever and chills.  She was unable to get out of bed.  She was also endorsing foot pain.  She states she feels as if her right leg is cold and having poor blood flow.  She has been having shooting pain all day.  EMS was called and she was found to be hypotensive with systolics in the 80s.  She was brought to the ER where she was afebrile hemodynamically stable.  Labs were obtained which showed WBC 9.3, hemoglobin 11.8, bicarb 14, creatinine 1.3, lactic acid 3.2, 1.4, respiratory viral panel negative.  INR 1.5.  Beta hydroxybutyrate 0.8.  Patient underwent x-ray of chest which showed chronic findings x-ray of foot showed no acute fracture.  Patient was started on broad-spectrum antibiotics due to the acidosis and concern for sepsis   Review of Systems: Review of Systems  Constitutional:  Positive for chills and fever.  HENT: Negative.    Eyes: Negative.   Respiratory: Negative.    Cardiovascular: Negative.   Gastrointestinal: Negative.   Genitourinary: Negative.   Musculoskeletal: Negative.   Skin: Negative.   Neurological: Negative.   Endo/Heme/Allergies: Negative.   Psychiatric/Behavioral: Negative.       As per HPI otherwise 10 point review of systems negative.   No Known Allergies  Past Medical History:  Diagnosis Date   Atrial fibrillation with RVR (HCC)    Chronic combined systolic (congestive) and diastolic (congestive) heart failure (HCC)    Dyspnea    Goiter    Hypertension    NICM (nonischemic cardiomyopathy) (HCC)    a. 08/2005  Echo: EF 30-35%, mod diff HK. Mild to Mod MR. Mildly dil LA; b. 08/2005 Cath: Nl Cors. Elevated CO w/o shunt; c. 09/2007 Echo: EF 45%. Mild to mod MR; c. 03/2015 Echo: EF 45%, global HK. Gr1 DD. Mild MR. Mildly dil LA.    Past Surgical History:  Procedure Laterality Date   APPENDECTOMY     BACK SURGERY     CARDIOVERSION N/A 03/14/2018   Procedure: CARDIOVERSION;  Surgeon: Pricilla Riffle, MD;  Location: [facility] ENDOSCOPY;  Service: Cardiovascular;  Laterality: N/A;   TEE WITHOUT CARDIOVERSION N/A 03/14/2018   Procedure: TRANSESOPHAGEAL ECHOCARDIOGRAM (TEE);  Surgeon: Pricilla Riffle, MD;  Location: [facility] ENDOSCOPY;  Service: Cardiovascular;  Laterality: N/A;   TEE WITHOUT CARDIOVERSION N/A 02/26/2022   Procedure: TRANSESOPHAGEAL ECHOCARDIOGRAM (TEE);  Surgeon: Dorthula Nettles, DO;  Location: [facility] ENDOSCOPY;  Service: Cardiovascular;  Laterality: N/A;     reports that she has been smoking cigarettes. She started smoking about 35 years ago. She has a 60 pack-year smoking history. She has never used smokeless tobacco. She reports that she does not drink alcohol and does not use drugs.  Family History  Problem Relation Age of Onset   Cancer Mother    Heart disease Father     Prior to Admission medications   Medication Sig Start Date End Date Taking? Authorizing Provider  acetaminophen-codeine (TYLENOL #3) 300-30 MG tablet 1 po q 8 hrs prn pain 01/09/23   Cammy Copa, MD  carvedilol (COREG) 3.125 MG tablet Take 1  tablet (3.125 mg total) by mouth 2 (two) times daily with a meal. 11/07/22   Milford, Anderson Malta, FNP  diazepam (VALIUM) 5 MG tablet Take one tablet by mouth with food one hour prior to procedure. May repeat 30 minutes prior if needed. 10/26/22   Juanda Chance, NP  dofetilide (TIKOSYN) 125 MCG capsule Take 1 capsule (125 mcg total) by mouth 2 (two) times daily. 02/04/23   Eustace Pen, PA-C  ELIQUIS 5 MG TABS tablet TAKE 1 TABLET BY MOUTH TWICE  DAILY 07/09/22   Nahser, Deloris Ping, MD   furosemide (LASIX) 40 MG tablet Take 0.5 tablets (20 mg total) by mouth daily. 01/15/23   Sabharwal, Aditya, DO  HYDROcodone-acetaminophen (NORCO/VICODIN) 5-325 MG tablet Take 1 tablet by mouth every 4 (four) hours as needed for moderate pain (pain score 4-6). 02/09/23 02/09/24  Elson Areas, PA-C  magnesium oxide (MAG-OX) 400 (240 Mg) MG tablet Take 1 tablet (400 mg total) by mouth daily. 11/16/22   Eustace Pen, PA-C  methimazole (TAPAZOLE) 5 MG tablet Take 1 tablet (5 mg total) by mouth daily. 07/19/22   Nahser, Deloris Ping, MD  potassium chloride (KLOR-CON) 10 MEQ tablet TAKE 1 TABLET(10 MEQ) BY MOUTH DAILY 01/28/23   Eustace Pen, PA-C  sacubitril-valsartan (ENTRESTO) 97-103 MG TAKE 1 TABLET BY MOUTH TWICE  DAILY 09/18/22   Sabharwal, Aditya, DO  spironolactone (ALDACTONE) 25 MG tablet TAKE 1/2 TABLET(12.5 MG) BY MOUTH DAILY 07/04/22   Allayne Butcher, PA-C    Physical Exam: Vitals:   03/03/23 1845 03/03/23 1859 03/03/23 1930 03/03/23 2000  BP: 119/76  94/64 95/68  Pulse: 93  73 80  Resp: (!) 23  16 (!) 8  Temp:  98.6 F (37 C)    TempSrc:  Rectal    SpO2: 100%  100% 100%  Weight:      Height:       Physical Exam Constitutional:      Appearance: She is normal weight.  HENT:     Head: Normocephalic.     Nose: Nose normal.     Mouth/Throat:     Mouth: Mucous membranes are moist.     Pharynx: Oropharynx is clear.  Eyes:     Conjunctiva/sclera: Conjunctivae normal.     Pupils: Pupils are equal, round, and reactive to light.  Cardiovascular:     Rate and Rhythm: Normal rate and regular rhythm.     Pulses: Normal pulses.     Heart sounds: Normal heart sounds.  Pulmonary:     Effort: Pulmonary effort is normal.     Breath sounds: Normal breath sounds.  Abdominal:     General: Abdomen is flat. Bowel sounds are normal.  Musculoskeletal:        General: Tenderness present.     Cervical back: Normal range of motion.  Skin:    General: Skin is warm.     Capillary  Refill: Capillary refill takes less than 2 seconds.  Neurological:     General: No focal deficit present.     Mental Status: She is alert.  Psychiatric:        Mood and Affect: Mood normal.        Labs on Admission: I have personally reviewed the patients's labs and imaging studies.  Assessment/Plan Active Problems:   Sepsis (HCC)   # Right foot pain - Patient has palpable pulses however foot feels cold - Patient endorsing pain with movement and claudication with walking  Plan: Obtain ABIs  Poor perfusion could be due to cardiogenic shock  # Sepsis, unclear etiology # Lactic acidosis -Patient hypotensive on presentation with lactic acid 3.2 - Given history of heart failure underwent conservative volume resuscitation  Plan: Continue IV fluids Continue vancomycin and cefepime  # COPD-continue home inhalers  # History of paroxysmal A-fib-continue Eliquis.  Patient was previously on Tikosyn however unable to confirm compliance with medication  # History of heart failure reduced ejection fraction-patient taking Lasix at home.  Last echocardiogram was obtained on 1/2 which showed EF 40 to 45%.  Will continue to monitor volume status in setting of resuscitation.  Will hold spironolactone and Entresto due to hypotension  # Hypertension  # Hyperthyroidism-continue methimazole   Admission status: Inpatient Progressive  Certification: The appropriate patient status for this patient is INPATIENT. Inpatient status is judged to be reasonable and necessary in order to provide the required intensity of service to ensure the patient's safety. The patient's presenting symptoms, physical exam findings, and initial radiographic and laboratory data in the context of their chronic comorbidities is felt to place them at high risk for further clinical deterioration. Furthermore, it is not anticipated that the patient will be medically stable for discharge from the hospital within 2 midnights  of admission.   * I certify that at the point of admission it is my clinical judgment that the patient will require inpatient hospital care spanning beyond 2 midnights from the point of admission due to high intensity of service, high risk for further deterioration and high frequency of surveillance required.Alan Mulder MD [facility] If 7PM-7AM, please contact night-coverage [email or web address]  03/03/2023, 9:09 PM

## 2023-03-04 ENCOUNTER — Inpatient Hospital Stay (HOSPITAL_COMMUNITY): Payer: 59

## 2023-03-04 ENCOUNTER — Other Ambulatory Visit: Payer: Self-pay

## 2023-03-04 ENCOUNTER — Encounter (HOSPITAL_COMMUNITY): Payer: Self-pay | Admitting: Internal Medicine

## 2023-03-04 ENCOUNTER — Ambulatory Visit: Payer: 59 | Admitting: Internal Medicine

## 2023-03-04 DIAGNOSIS — A419 Sepsis, unspecified organism: Secondary | ICD-10-CM | POA: Diagnosis not present

## 2023-03-04 DIAGNOSIS — R6520 Severe sepsis without septic shock: Secondary | ICD-10-CM | POA: Diagnosis not present

## 2023-03-04 DIAGNOSIS — I70219 Atherosclerosis of native arteries of extremities with intermittent claudication, unspecified extremity: Secondary | ICD-10-CM

## 2023-03-04 DIAGNOSIS — I739 Peripheral vascular disease, unspecified: Secondary | ICD-10-CM

## 2023-03-04 LAB — BASIC METABOLIC PANEL
Anion gap: 9 (ref 5–15)
Anion gap: 9 (ref 5–15)
BUN: 27 mg/dL — ABNORMAL HIGH (ref 8–23)
BUN: 19 mg/dL (ref 8–23)
CO2: 20 mmol/L — ABNORMAL LOW (ref 22–32)
CO2: 19 mmol/L — ABNORMAL LOW (ref 22–32)
Calcium: 8.7 mg/dL — ABNORMAL LOW (ref 8.9–10.3)
Calcium: 8.4 mg/dL — ABNORMAL LOW (ref 8.9–10.3)
Chloride: 106 mmol/L (ref 98–111)
Chloride: 111 mmol/L (ref 98–111)
Creatinine, Ser: 1.04 mg/dL — ABNORMAL HIGH (ref 0.44–1.00)
Creatinine, Ser: 0.8 mg/dL (ref 0.44–1.00)
GFR, Estimated: >60 mL/min (ref >60)
GFR, Estimated: >60 mL/min (ref >60)
Glucose, Bld: 152 mg/dL — ABNORMAL HIGH (ref 70–99)
Glucose, Bld: 97 mg/dL (ref 70–99)
Potassium: 3.6 mmol/L (ref 3.5–5.1)
Potassium: 4.4 mmol/L (ref 3.5–5.1)
Sodium: 135 mmol/L (ref 135–145)
Sodium: 139 mmol/L (ref 135–145)

## 2023-03-04 LAB — MAGNESIUM
Magnesium: 1.6 mg/dL — ABNORMAL LOW (ref 1.7–2.4)
Magnesium: 2.2 mg/dL (ref 1.7–2.4)

## 2023-03-04 LAB — URINALYSIS, W/ REFLEX TO CULTURE (INFECTION SUSPECTED)
Bilirubin Urine: NEGATIVE
Glucose, UA: NEGATIVE mg/dL
Ketones, ur: NEGATIVE mg/dL
Nitrite: NEGATIVE
Protein, ur: NEGATIVE mg/dL
Specific Gravity, Urine: 1.01 (ref 1.005–1.030)
WBC, UA: >50 WBC/hpf (ref 0–5)
pH: 5 (ref 5.0–8.0)

## 2023-03-04 LAB — CBC
HCT: 33.5 % — ABNORMAL LOW (ref 36.0–46.0)
Hemoglobin: 10.1 g/dL — ABNORMAL LOW (ref 12.0–15.0)
MCH: 27.7 pg (ref 26.0–34.0)
MCHC: 30.1 g/dL (ref 30.0–36.0)
MCV: 91.8 fL (ref 80.0–100.0)
Platelets: 318 10*3/uL (ref 150–400)
RBC: 3.65 MIL/uL — ABNORMAL LOW (ref 3.87–5.11)
RDW: 13.4 % (ref 11.5–15.5)
WBC: 6.4 10*3/uL (ref 4.0–10.5)
nRBC: 0 % (ref 0.0–0.2)

## 2023-03-04 LAB — TROPONIN I (HIGH SENSITIVITY): Troponin I (High Sensitivity): 14 ng/L (ref <18)

## 2023-03-04 LAB — VAS US ABI WITH/WO TBI
Left ABI: 1.11
Right ABI: 0.6

## 2023-03-04 MED ORDER — POTASSIUM CHLORIDE CRYS ER 20 MEQ PO TBCR
20.0000 meq | EXTENDED_RELEASE_TABLET | Freq: Once | ORAL | Status: AC
  Administered 2023-03-04: 20 meq via ORAL
  Filled 2023-03-04: qty 1

## 2023-03-04 MED ORDER — POTASSIUM CHLORIDE CRYS ER 20 MEQ PO TBCR
40.0000 meq | EXTENDED_RELEASE_TABLET | Freq: Once | ORAL | Status: AC
  Administered 2023-03-04: 40 meq via ORAL
  Filled 2023-03-04: qty 2

## 2023-03-04 MED ORDER — MAGNESIUM SULFATE 2 GM/50ML IV SOLN
2.0000 g | Freq: Once | INTRAVENOUS | Status: AC
  Administered 2023-03-04: 2 g via INTRAVENOUS
  Filled 2023-03-04: qty 50

## 2023-03-04 MED ORDER — MAGNESIUM SULFATE 2 GM/50ML IV SOLN
2.0000 g | Freq: Once | INTRAVENOUS | Status: AC
Start: 2023-03-04 — End: 2023-03-04
  Administered 2023-03-04: 2 g via INTRAVENOUS
  Filled 2023-03-04: qty 50

## 2023-03-04 MED ORDER — DOFETILIDE 125 MCG PO CAPS
125.0000 ug | ORAL_CAPSULE | Freq: Two times a day (BID) | ORAL | Status: DC
  Administered 2023-03-04 – 2023-03-09 (×11): 125 ug via ORAL
  Filled 2023-03-04 (×12): qty 1

## 2023-03-04 MED ORDER — CARVEDILOL 3.125 MG PO TABS
3.1250 mg | ORAL_TABLET | Freq: Two times a day (BID) | ORAL | Status: DC
  Administered 2023-03-05 – 2023-03-09 (×6): 3.125 mg via ORAL
  Filled 2023-03-04 (×8): qty 1

## 2023-03-04 NOTE — ED Provider Notes (Incomplete)
[facility] EMERGENCY DEPARTMENT AT [facility] Provider Note  MDM   HPI/ROS:  Joanna [name] is a 63 y.o. female with pertinent past medical history of COPD, HFrEF, A-fib RVR, HTN, thyroiditis who presents for evaluation of fatigue and hypotension. Patient reports 1 month history of worsening fatigue with states that today she became acutely lightheaded and could not get out of bed, which prompted family to call EMS.  She was seen at urgent care on 1/4 for pain in tooth of her left lower mouth at which time she was started on treatment with clindamycin for a likely dental abscess.  She states she has completed this prescription as prescribed.  She reports ongoing subjective fevers and chills.  She additionally reports over the past week she has had worsening right sided foot pain and feels that foot is cooler than the other.  She denies any known trauma.  She reports poor p.o. intake because she is too weak to get up.  She was reportedly hypotensive to the 22s with EMS so she was given 300 cc IV fluid with improvement to the 100/60s  Physical exam is notable for: - Appears dehydrated with dry, cracked lips, dry mucous membranes, mildly prolonged capillary refill -- Extremities mildly cool to touch with intact distal pulses -- Tenderness to palpation diffusely over the right foot most prominent over the base of the first metatarsal without obvious deformity, swelling, ecchymosis -- Numerous dental caries without obvious dental abscess, facial swelling  On my initial evaluation, patient is:  -Mildly hypertensive with blood pressures in the 80s-90s/50s-60s though otherwise stable vital signs. Patient afebrile, confirmed by rectal temperature. -Appears chronically ill but non-toxic appearing. -Additional history obtained from EMS report, review prior records -Per chart review, blood pressure 89/63 at recent urgent care visit on 02/09/2023, however prior to this time was  normotensive  Overall, suspect etiology of patient's hypotension most likely due to hypovolemia in the setting of poor p.o. intake.  Lower suspicion for sepsis/septic shock as patient afebrile with no obvious sources of infection, however will obtain concurrent infectious workup given patient does report subjective fevers at home and had recent dental abscess.  Also considered cardiogenic shock particularly with patient's history of HFrEF, however clinically she appears hypovolemic and blood pressure responsive to IV fluids which is more consistent with hypovolemia.  Regarding her foot pain, no obvious signs of trauma and does not appear consistent with gout, septic arthritis, necrotizing skin infection, cellulitis.  Considered possible ischemic limb/claudication given multiple cavities and mildly cool extremities, however skin does not appear mottled and has palpable distal pulses with intact Doppler flow.   Interpretations, interventions, and the patient's course of care are documented below.   -Labs reviewed: WBC 9.3, hemoglobin 11.8, creatinine 1.32, BUN 26, CO2 14, AG 16, lactic acid 3.2 > 1.4, beta hydroxybutyrate 0.87, BNP 69.2, troponin 12 -Imaging reviewed: Chest x-ray with stable cardiomegaly, no significant pulmonary edema or focal consolidation -Right foot x-ray with no acute fracture or dislocation  Presentation is overall consistent with hypotension and hypovolemia due to dehydration and poor p.o. intake.  Patient with initial mild lactic acidosis that improved after fluid resuscitation and mildly elevated beta hydroxybutyrate consistent with likely component of starvation ketosis.  BNP negative, reassuring against heart failure exacerbation and patient without leukocytosis or other findings on exam or workup that would concern for sepsis.  Disposition:  I discussed the case with Dr. Avie Arenas with the hospitalist service who graciously agreed to admit the patient to their  service for  continued care.  Please see their note for further treatment plan details  Clinical Impression: No diagnosis found.  Rx / DC Orders ED Discharge Orders     None       The plan for this patient was discussed with Dr. ***, who voiced agreement and who oversaw evaluation and treatment of this patient.   Clinical Complexity A medically appropriate history, review of systems, and physical exam was performed.  My independent interpretations of EKG, labs, and radiology are documented in the ED course above.   If decision rules were used in this patient's evaluation, they are listed below.  *** Click here for ABCD2, HEART and other calculatorsREFRESH Note before signing   Patient's presentation is most consistent with {EM COPA:27473}  Medical Decision Making Amount and/or Complexity of Data Reviewed Labs: ordered. Radiology: ordered.  Risk Prescription drug management. Decision regarding hospitalization.    HPI/ROS      See MDM section for pertinent HPI and ROS. A complete ROS was performed with pertinent positives/negatives noted above.   Past Medical History:  Diagnosis Date  . Atrial fibrillation with RVR (HCC)   . Chronic combined systolic (congestive) and diastolic (congestive) heart failure (HCC)   . Dyspnea   . Goiter   . Hypertension   . NICM (nonischemic cardiomyopathy) (HCC)    a. 08/2005 Echo: EF 30-35%, mod diff HK. Mild to Mod MR. Mildly dil LA; b. 08/2005 Cath: Nl Cors. Elevated CO w/o shunt; c. 09/2007 Echo: EF 45%. Mild to mod MR; c. 03/2015 Echo: EF 45%, global HK. Gr1 DD. Mild MR. Mildly dil LA.    Past Surgical History:  Procedure Laterality Date  . APPENDECTOMY    . BACK SURGERY    . CARDIOVERSION N/A 03/14/2018   Procedure: CARDIOVERSION;  Surgeon: Pricilla Riffle, MD;  Location: [facility] ENDOSCOPY;  Service: Cardiovascular;  Laterality: N/A;  . TEE WITHOUT CARDIOVERSION N/A 03/14/2018   Procedure: TRANSESOPHAGEAL ECHOCARDIOGRAM (TEE);  Surgeon: Pricilla Riffle,  MD;  Location: [facility] ENDOSCOPY;  Service: Cardiovascular;  Laterality: N/A;  . TEE WITHOUT CARDIOVERSION N/A 02/26/2022   Procedure: TRANSESOPHAGEAL ECHOCARDIOGRAM (TEE);  Surgeon: Dorthula Nettles, DO;  Location: [facility] ENDOSCOPY;  Service: Cardiovascular;  Laterality: N/A;      Physical Exam   There were no vitals filed for this visit.  Physical Exam Gen: NAD. Appears comfortable HENT: Conjunctiva clear, PERRL, EOMI. MMM.  CV: RRR. No M/R/G Pulm: Lungs CTAB with no wheezing, rales, or rhonchi.  GI: Abdomen soft, non-tender, non-distended. Normal bowel sounds in all 4 quadrants. MSK/Skin: No lower extremity edema. Extremities warm, well-perfused with 2+ pulses in all 4 extremities. Neuro: A&Ox3. GCS 15. Moves all extremities.     Procedures   If procedures were preformed on this patient, they are listed below:  Procedures   Mikeal Hawthorne, MD Emergency Medicine PGY-2   Please note that this documentation was produced with the assistance of voice-to-text technology and may contain errors.

## 2023-03-04 NOTE — Progress Notes (Signed)
PROGRESS NOTE    Joanna [name]  UEA:[id number] DOB: 1960-05-06 DOA: 03/03/2023 PCP: Marcine Matar, MD   Brief Narrative:  This 62 yrs old female with PMH significant of A-fib, CHF, hypertension who presented to the ED with c/o: 1 month of fatigue, lightheadedness. Patient was recently treated for a dental infection. She completed the prescription however developed fever and chills. She was unable to get out of bed. She was also endorsing foot pain. EMS was called and she was found to be hypotensive with systolic in the 80s. She was brought to the ER where she was afebrile and hemodynamically stable.  Patient underwent x-ray of chest which showed chronic findings,  X-ray Right foot showed no acute fracture.  Patient was started on broad-spectrum antibiotics due to the lactic acidosis and concern for sepsis.  She was admitted for further evaluation.  Assessment & Plan:   Active Problems:   Sepsis (HCC)   Sepsis likely due to suspected pneumonia: Patient presented with tachycardia, tachypnea, lactic acidosis,  hypotension. Lactic acid 3.2, improved with IV hydration. Continue gentle volume resuscitation. Continue empiric antibiotics (vancomycin and cefepime). Follow-up blood cultures urine cultures CT chest showed mild patchy airspace opacity right lower lobe worrisome for pneumonia.  Right foot pain: Patient has palpable pulses however foot feels cold. Patient endorsing pain with movement and claudication with walking Obtain ABIs Poor perfusion could be due to cardiogenic shock.  COPD: No acute exacerbation. Continue home inhalers.   History of paroxysmal A-fib: Continue Tikosyn and Eliquis. Heart rate controlled. Continue Coreg.  History of heart failure reduced ejection fraction: Patient taking Lasix at home.  Last echocardiogram was obtained on 1/2 which showed EF 40 to 45%.  Continue to monitor volume status in setting of resuscitation.  Hold spironolactone and  Entresto due to hypotension.   Essential Hypertension:  Continue Coreg.   Hyperthyroidism: Continue methimazole  Hypomagnesemia: Replaced.  Continue to monitor  Acute kidney injury: Likely due to sepsis. Creatinine 1.32, baseline normal. Avoid nephrotoxic medications. Monitor serum creatinine.  Splenic lesion/bilateral adrenal nodules: Follow-up outpatient  DVT prophylaxis: Eliquis Code Status: Full code Family Communication:No family at bed side. Disposition Plan:  Status is: Inpatient Remains inpatient appropriate because: Admitted for sepsis due to unknown etiology.     Consultants:  None  Procedures: None  Antimicrobials: Anti-infectives (From admission, onward)    Start     Dose/Rate Route Frequency Ordered Stop   03/04/23 2200  vancomycin (VANCOCIN) IVPB 1000 mg/200 mL premix        1,000 mg 200 mL/hr over 60 Minutes Intravenous Every 24 hours 03/03/23 2144     03/03/23 2130  vancomycin (VANCOREADY) IVPB 1250 mg/250 mL        1,250 mg 166.7 mL/hr over 90 Minutes Intravenous  Once 03/03/23 2124 03/04/23 0056   03/03/23 2130  ceFEPIme (MAXIPIME) 2 g in sodium chloride 0.9 % 100 mL IVPB        2 g 200 mL/hr over 30 Minutes Intravenous Every 12 hours 03/03/23 2124         Subjective: She was seen and examined at bedside.  Overnight events noted. Patient continued to complain about right foot sharp pain.  Denies any other concerns.  Objective: Vitals:   03/04/23 0900 03/04/23 0930 03/04/23 1000 03/04/23 1030  BP: 109/74 109/69 (!) 96/56 103/68  Pulse: (!) 58 62 61 67  Resp: 15 17 14  (!) 21  Temp:      TempSrc:      SpO2: 100%  100% 100% 100%  Weight:      Height:        Intake/Output Summary (Last 24 hours) at 03/04/2023 1121 Last data filed at 03/04/2023 0659 Gross per 24 hour  Intake 650.76 ml  Output --  Net 650.76 ml   Filed Weights   03/03/23 1659  Weight: 64 kg    Examination:  General exam: Appears calm and comfortable, thin,  deconditioned, not in any acute distress. Respiratory system: Clear to auscultation. Respiratory effort normal.  RR 15 Cardiovascular system: S1 & S2 heard, RRR. No JVD, murmurs, rubs, gallops or clicks.  Gastrointestinal system: Abdomen is non distended, soft and non tender. Normal bowel sounds heard. Central nervous system: Alert and oriented x 3. No focal neurological deficits. Extremities: No edema, no cyanosis, no clubbing Skin: No rashes, lesions or ulcers Psychiatry: Judgement and insight appear normal. Mood & affect appropriate.     Data Reviewed: I have personally reviewed following labs and imaging studies  CBC: Recent Labs  Lab 03/03/23 1703 03/04/23 0415  WBC 9.3 6.4  NEUTROABS 7.2  --   HGB 11.8* 10.1*  HCT 40.6 33.5*  MCV 95.8 91.8  PLT 336 318   Basic Metabolic Panel: Recent Labs  Lab 03/03/23 1703 03/04/23 0415  NA 138 135  K 4.7 3.6  CL 108 106  CO2 14* 20*  GLUCOSE 98 152*  BUN 26* 27*  CREATININE 1.32* 1.04*  CALCIUM 9.0 8.7*  MG  --  1.6*   GFR: Estimated Creatinine Clearance: 55.9 mL/min (A) (by C-G formula based on SCr of 1.04 mg/dL (H)). Liver Function Tests: Recent Labs  Lab 03/03/23 1703  AST 12*  ALT 8  ALKPHOS 54  BILITOT 0.8  PROT 6.8  ALBUMIN 3.1*   No results for input(s): "LIPASE", "AMYLASE" in the last 168 hours. No results for input(s): "AMMONIA" in the last 168 hours. Coagulation Profile: Recent Labs  Lab 03/03/23 1830  INR 1.5*   Cardiac Enzymes: No results for input(s): "CKTOTAL", "CKMB", "CKMBINDEX", "TROPONINI" in the last 168 hours. BNP (last 3 results) No results for input(s): "PROBNP" in the last 8760 hours. HbA1C: No results for input(s): "HGBA1C" in the last 72 hours. CBG: No results for input(s): "GLUCAP" in the last 168 hours. Lipid Profile: No results for input(s): "CHOL", "HDL", "LDLCALC", "TRIG", "CHOLHDL", "LDLDIRECT" in the last 72 hours. Thyroid Function Tests: No results for input(s): "TSH",  "T4TOTAL", "FREET4", "T3FREE", "THYROIDAB" in the last 72 hours. Anemia Panel: No results for input(s): "VITAMINB12", "FOLATE", "FERRITIN", "TIBC", "IRON", "RETICCTPCT" in the last 72 hours. Sepsis Labs: Recent Labs  Lab 03/03/23 1722 03/03/23 1938  LATICACIDVEN 3.2* 1.4    Recent Results (from the past 240 hours)  Resp panel by RT-PCR (RSV, Flu A&B, Covid) Anterior Nasal Swab     Status: None   Collection Time: 03/03/23  6:13 PM   Specimen: Anterior Nasal Swab  Result Value Ref Range Status   SARS Coronavirus 2 by RT PCR NEGATIVE NEGATIVE Final   Influenza A by PCR NEGATIVE NEGATIVE Final   Influenza B by PCR NEGATIVE NEGATIVE Final    Comment: (NOTE) The Xpert Xpress SARS-CoV-2/FLU/RSV plus assay is intended as an aid in the diagnosis of influenza from Nasopharyngeal swab specimens and should not be used as a sole basis for treatment. Nasal washings and aspirates are unacceptable for Xpert Xpress SARS-CoV-2/FLU/RSV testing.  Fact Sheet for Patients: BloggerCourse.com  Fact Sheet for Healthcare Providers: SeriousBroker.it  This test is not yet approved or cleared  by the [place] and has been authorized for detection and/or diagnosis of SARS-CoV-2 by FDA under an Emergency Use Authorization (EUA). This EUA will remain in effect (meaning this test can be used) for the duration of the COVID-19 declaration under Section 564(b)(1) of the Act, 21 U.S.C. section 360bbb-3(b)(1), unless the authorization is terminated or revoked.     Resp Syncytial Virus by PCR NEGATIVE NEGATIVE Final    Comment: (NOTE) Fact Sheet for Patients: BloggerCourse.com  Fact Sheet for Healthcare Providers: SeriousBroker.it  This test is not yet approved or cleared by the [place] FDA and has been authorized for detection and/or diagnosis of SARS-CoV-2 by FDA under an Emergency Use  Authorization (EUA). This EUA will remain in effect (meaning this test can be used) for the duration of the COVID-19 declaration under Section 564(b)(1) of the Act, 21 U.S.C. section 360bbb-3(b)(1), unless the authorization is terminated or revoked.  Performed at [facility], [address] [place], [place] [address]   Blood Culture (routine x 2)     Status: None (Preliminary result)   Collection Time: 03/03/23  6:30 PM   Specimen: BLOOD RIGHT HAND  Result Value Ref Range Status   Specimen Description BLOOD RIGHT HAND  Final   Special Requests   Final    BOTTLES DRAWN AEROBIC AND ANAEROBIC Blood Culture results may not be optimal due to an inadequate volume of blood received in culture bottles   Culture   Final    NO GROWTH < 12 HOURS Performed at [facility], [address] [place], [place] [address]    Report Status PENDING  Incomplete   Radiology Studies: CT CHEST ABDOMEN PELVIS WO CONTRAST Result Date: 03/04/2023 CLINICAL DATA:  Sepsis. EXAM: CT CHEST, ABDOMEN AND PELVIS WITHOUT CONTRAST TECHNIQUE: Multidetector CT imaging of the chest, abdomen and pelvis was performed following the standard protocol without IV contrast. RADIATION DOSE REDUCTION: This exam was performed according to the departmental dose-optimization program which includes automated exposure control, adjustment of the mA and/or kV according to patient size and/or use of iterative reconstruction technique. COMPARISON:  CT angiogram chest 08/29/2005 FINDINGS: CT CHEST FINDINGS Cardiovascular: Heart and aorta are normal in size. There are atherosclerotic calcifications in the aorta. There is a trace pericardial effusion. Mediastinum/Nodes: Stable mildly enlarged precarinal lymph node. No other enlarged lymph nodes are seen allowing for lack of intravenous contrast. Visualized esophagus and thyroid gland are within normal limits. Lungs/Pleura: Paraseptal emphysematous changes are mild-to-moderate and have  increased in the upper lungs. There is mild patchy airspace opacity in the posteroinferior right lower lobe. There is no pleural effusion or pneumothorax. Trachea and central airways are patent. There are calcified granulomas in the right lower lung. Musculoskeletal: No chest wall mass or suspicious bone lesions identified. CT ABDOMEN PELVIS FINDINGS Hepatobiliary: No focal liver abnormality is seen. No gallstones, gallbladder wall thickening, or biliary dilatation. Pancreas: Unremarkable. No pancreatic ductal dilatation or surrounding inflammatory changes. Spleen: There is an indeterminate hypodense lesion in the anterior spleen measuring 2.4 x 2.7 cm. The spleen is normal in size. Adrenals/Urinary Tract: Bilateral adrenal nodules are present, indeterminate. Right adrenal nodule measures up to 17 mm and left adrenal nodule measures up to 2.4 cm. There is a single punctate calculus in the right kidney. There is no definite hydronephrosis. There is mild bilateral perinephric fat stranding, nonspecific. The bladder is within normal limits. Stomach/Bowel: Stomach is within normal limits. Appendix is not seen. No evidence of bowel wall thickening,  distention, or inflammatory changes. Vascular/Lymphatic: Aortic atherosclerosis. No enlarged abdominal or pelvic lymph nodes. Reproductive: Uterus is heterogeneous, lobulated and mildly enlarged containing multiple calcifications, likely due to fibroids. Calcified fibroids measure up to 16 mm. The ovaries are not well delineated on this study. Other: There is no ascites.  There is mild body wall edema. Musculoskeletal: Degenerative changes affect the spine. There also severe degenerative changes of the right hip. IMPRESSION: 1. Mild patchy airspace opacity in the right lower lobe worrisome for pneumonia. 2. Emphysema. 3. Trace pericardial effusion. 4. Indeterminate hypodense lesion in the spleen. This can be further evaluated with MRI. 5. Bilateral adrenal nodules,  indeterminate. 6. Nonobstructing right renal calculus. 7. Fibroid uterus. 8. Mild body wall edema. Aortic Atherosclerosis (ICD10-I70.0) and Emphysema (ICD10-J43.9). Electronically Signed   By: Darliss Cheney M.D.   On: 03/04/2023 01:42   DG Foot Complete Right Result Date: 03/03/2023 CLINICAL DATA:  Right foot pain EXAM: RIGHT FOOT COMPLETE - 3 VIEW COMPARISON:  None Available. FINDINGS: There is no evidence of fracture or dislocation. There is no evidence of arthropathy or other focal bone abnormality. Soft tissues are unremarkable. IMPRESSION: No acute fracture or dislocation. Electronically Signed   By: Agustin Cree M.D.   On: 03/03/2023 19:42   DG Chest Portable 1 View Result Date: 03/03/2023 CLINICAL DATA:  Dyspnea. EXAM: PORTABLE CHEST 1 VIEW COMPARISON:  Chest radiograph dated December 20, 2021. FINDINGS: Stable cardiomegaly. Mediastinal contours are within normal limits. Aortic atherosclerosis. Bilateral interstitial coarsening. No focal consolidation, pleural effusion, or pneumothorax. No acute osseous abnormality. IMPRESSION: Stable cardiomegaly. Bilateral interstitial coarsening could reflect chronic interstitial changes, edema, or atypical infection. Electronically Signed   By: Hart Robinsons M.D.   On: 03/03/2023 18:27   Scheduled Meds:  apixaban  5 mg Oral BID   carvedilol  3.125 mg Oral BID WC   dofetilide  125 mcg Oral BID   methimazole  5 mg Oral Daily   Continuous Infusions:  ceFEPime (MAXIPIME) IV Stopped (03/03/23 2227)   lactated ringers 75 mL/hr at 03/04/23 0659   vancomycin       LOS: 1 day    Time spent: 50 mins    Willeen Niece, MD [facility]   If 7PM-7AM, please contact night-coverage

## 2023-03-05 ENCOUNTER — Ambulatory Visit (HOSPITAL_COMMUNITY): Payer: 59 | Admitting: Internal Medicine

## 2023-03-05 ENCOUNTER — Inpatient Hospital Stay (HOSPITAL_COMMUNITY): Payer: 59

## 2023-03-05 DIAGNOSIS — A419 Sepsis, unspecified organism: Secondary | ICD-10-CM | POA: Diagnosis not present

## 2023-03-05 DIAGNOSIS — I70221 Atherosclerosis of native arteries of extremities with rest pain, right leg: Secondary | ICD-10-CM

## 2023-03-05 LAB — CBC
HCT: 35.6 % — ABNORMAL LOW (ref 36.0–46.0)
Hemoglobin: 10.9 g/dL — ABNORMAL LOW (ref 12.0–15.0)
MCH: 27.5 pg (ref 26.0–34.0)
MCHC: 30.6 g/dL (ref 30.0–36.0)
MCV: 89.9 fL (ref 80.0–100.0)
Platelets: 311 10*3/uL (ref 150–400)
RBC: 3.96 MIL/uL (ref 3.87–5.11)
RDW: 13.2 % (ref 11.5–15.5)
WBC: 10 10*3/uL (ref 4.0–10.5)
nRBC: 0 % (ref 0.0–0.2)

## 2023-03-05 LAB — URINE CULTURE: Culture: NO GROWTH

## 2023-03-05 LAB — BASIC METABOLIC PANEL
Anion gap: 8 (ref 5–15)
BUN: 15 mg/dL (ref 8–23)
CO2: 20 mmol/L — ABNORMAL LOW (ref 22–32)
Calcium: 9.2 mg/dL (ref 8.9–10.3)
Chloride: 109 mmol/L (ref 98–111)
Creatinine, Ser: 0.81 mg/dL (ref 0.44–1.00)
GFR, Estimated: >60 mL/min (ref >60)
Glucose, Bld: 108 mg/dL — ABNORMAL HIGH (ref 70–99)
Potassium: 4.3 mmol/L (ref 3.5–5.1)
Sodium: 137 mmol/L (ref 135–145)

## 2023-03-05 LAB — MAGNESIUM: Magnesium: 1.9 mg/dL (ref 1.7–2.4)

## 2023-03-05 LAB — MRSA NEXT GEN BY PCR, NASAL: MRSA by PCR Next Gen: NOT DETECTED

## 2023-03-05 LAB — PHOSPHORUS: Phosphorus: 3 mg/dL (ref 2.5–4.6)

## 2023-03-05 MED ORDER — HEPARIN (PORCINE) 25000 UT/250ML-% IV SOLN
1550.0000 [IU]/h | INTRAVENOUS | Status: DC
  Administered 2023-03-05: 900 [IU]/h via INTRAVENOUS
  Administered 2023-03-06: 1300 [IU]/h via INTRAVENOUS
  Filled 2023-03-05 (×2): qty 250

## 2023-03-05 MED ORDER — ORAL CARE MOUTH RINSE: 15.0000 mL | OROMUCOSAL | Status: DC | PRN

## 2023-03-05 NOTE — Progress Notes (Signed)
Heart Failure Navigator Progress Note  Assessed for Heart & Vascular TOC clinic readiness.  Patient does not meet criteria due to Advanced Heart Failure Team patient of Dr. Gasper Lloyd.   Navigator will sign off at this time.   Rhae Hammock, BSN, Scientist, clinical (histocompatibility and immunogenetics) Only

## 2023-03-05 NOTE — Progress Notes (Signed)
Nursing handoff received from Junious Silk, RN at approx. 0245 hours this AM. Patient is AA+Ox4. The patient's right foot is warm to touch and equal in temperature to her left foot; right DP is unable to be identified with doppler; right PT is weakly manually palpable +1.; left DP and PT are +1 to manual palpation. The patient's right foot is equal in color to left foot with cap refill < 3 seconds. Patient is endorsing "itching" and occasionally "burning" to right foot. MIVF are turned off by this RN this AM (order expired).

## 2023-03-05 NOTE — Consult Note (Addendum)
Hospital Consult    Reason for Consult:  right foot rest pain Requesting Physician:  Willeen Niece MD MRN #:  [id number]  History of Present Illness: Joanna [name] is a 63 y.o. female with a PMH of HF, HTN, NICM, and a-fib with RVR who present to [facility] ED on 1/26 with 1 month of fatigue and lightheadedness. She was recently treated for a dental infection. She was also endorsing worsening right foot pain. Upon admission she was found to have lactic acidosis with concern for sepsis.   We were consulted with concern for right lower extremity rest pain. The patient states she has been dealing with shooting pains in her toes on the right for over a year. About 2 weeks ago she noticed the pain started spreading to most of her foot. This pain does wake her up at night. She states putting pressure on her foot or touching her foot also worsens the pain. She gets some relief sitting on the side of the bed and avoiding putting pressure on her foot. She also states the right foot will get "stiff" and "cold" intermittently. She endorses intermittent right calf claudication. She denies any pain in the left foot or leg.  She has been smoking cigarettes since she was 63 years old. She says when she was younger she probably smoked 2 PPD. Now she smokes about 2-3 cigarettes daily.  Past Medical History:  Diagnosis Date   Atrial fibrillation with RVR (HCC)    Chronic combined systolic (congestive) and diastolic (congestive) heart failure (HCC)    Dyspnea    Goiter    Hypertension    NICM (nonischemic cardiomyopathy) (HCC)    a. 08/2005 Echo: EF 30-35%, mod diff HK. Mild to Mod MR. Mildly dil LA; b. 08/2005 Cath: Nl Cors. Elevated CO w/o shunt; c. 09/2007 Echo: EF 45%. Mild to mod MR; c. 03/2015 Echo: EF 45%, global HK. Gr1 DD. Mild MR. Mildly dil LA.    Past Surgical History:  Procedure Laterality Date   APPENDECTOMY     BACK SURGERY     CARDIOVERSION N/A 03/14/2018   Procedure: CARDIOVERSION;   Surgeon: Pricilla Riffle, MD;  Location: [facility] ENDOSCOPY;  Service: Cardiovascular;  Laterality: N/A;   TEE WITHOUT CARDIOVERSION N/A 03/14/2018   Procedure: TRANSESOPHAGEAL ECHOCARDIOGRAM (TEE);  Surgeon: Pricilla Riffle, MD;  Location: [facility] ENDOSCOPY;  Service: Cardiovascular;  Laterality: N/A;   TEE WITHOUT CARDIOVERSION N/A 02/26/2022   Procedure: TRANSESOPHAGEAL ECHOCARDIOGRAM (TEE);  Surgeon: Dorthula Nettles, DO;  Location: [facility] ENDOSCOPY;  Service: Cardiovascular;  Laterality: N/A;    No Known Allergies  Prior to Admission medications   Medication Sig Start Date End Date Taking? Authorizing Provider  acetaminophen (TYLENOL) 500 MG tablet Take 1,000 mg by mouth every 6 (six) hours as needed for moderate pain (pain score 4-6) or headache (Foot pain, tooth pain).   Yes [provider]  carvedilol (COREG) 3.125 MG tablet Take 3.125 mg by mouth 2 (two) times daily. 11/04/22  Yes [provider]  dofetilide (TIKOSYN) 125 MCG capsule Take 1 capsule (125 mcg total) by mouth 2 (two) times daily. 02/04/23  Yes Eustace Pen, PA-C  ELIQUIS 5 MG TABS tablet TAKE 1 TABLET BY MOUTH TWICE  DAILY 07/09/22  Yes Nahser, Deloris Ping, MD  FARXIGA 10 MG TABS tablet Take 10 mg by mouth daily. 01/02/23  Yes [provider]  feeding supplement (ENSURE IMMUNE HEALTH) LIQD Take 237 mLs by mouth 3 (three) times daily with meals.   Yes  [provider]  furosemide (LASIX) 40 MG tablet Take 0.5 tablets (20 mg total) by mouth daily. 01/15/23  Yes Sabharwal, Aditya, DO  methimazole (TAPAZOLE) 5 MG tablet Take 1 tablet (5 mg total) by mouth daily. 07/19/22  Yes Nahser, Deloris Ping, MD  potassium chloride (KLOR-CON) 10 MEQ tablet TAKE 1 TABLET(10 MEQ) BY MOUTH DAILY 01/28/23  Yes Eustace Pen, PA-C  sacubitril-valsartan (ENTRESTO) 97-103 MG TAKE 1 TABLET BY MOUTH TWICE  DAILY 09/18/22  Yes Sabharwal, Aditya, DO  spironolactone (ALDACTONE) 25 MG tablet TAKE 1/2 TABLET(12.5 MG) BY MOUTH DAILY 07/04/22   Yes Robbie Lis M, PA-C  magnesium oxide (MAG-OX) 400 (240 Mg) MG tablet Take 1 tablet (400 mg total) by mouth daily. Patient not taking: Reported on 03/04/2023 11/16/22   Eustace Pen, PA-C    Social History   Socioeconomic History   Marital status: Single    Spouse name: Not on file   Number of children: 1   Years of education: Not on file   Highest education level: High school graduate  Occupational History   Occupation: unemployed    Comment: on disablity  Tobacco Use   Smoking status: Every Day    Current packs/day: 0.00    Average packs/day: 2.0 packs/day for 30.0 years (60.0 ttl pk-yrs)    Types: Cigarettes    Start date: 02/09/1988    Last attempt to quit: 02/08/2018    Years since quitting: 5.0   Smokeless tobacco: Never   Tobacco comments:    3 cigarettes daily 02/09/22  Vaping Use   Vaping status: Never Used  Substance and Sexual Activity   Alcohol use: No    Comment: quit 2 years ago   Drug use: No   Sexual activity: Not Currently  Other Topics Concern   Not on file  Social History Narrative   Not on file   Social Drivers of Health   Financial Resource Strain: Low Risk  (07/17/2022)   Overall Financial Resource Strain (CARDIA)    Difficulty of Paying Living Expenses: Not hard at all  Food Insecurity: No Food Insecurity (03/04/2023)   Hunger Vital Sign    Worried About Running Out of Food in the Last Year: Never true    Ran Out of Food in the Last Year: Never true  Transportation Needs: Unmet Transportation Needs (03/04/2023)   PRAPARE - Administrator, Civil Service (Medical): Yes    Lack of Transportation (Non-Medical): No  Physical Activity: Insufficiently Active (07/17/2022)   Exercise Vital Sign    Days of Exercise per Week: 3 days    Minutes of Exercise per Session: 30 min  Stress: No Stress Concern Present (07/17/2022)   [employer] of Occupational Health - Occupational Stress Questionnaire    Feeling of Stress : Not at all   Social Connections: Moderately Isolated (03/04/2023)   Social Connection and Isolation Panel [NHANES]    Frequency of Communication with Friends and Family: More than three times a week    Frequency of Social Gatherings with Friends and Family: Three times a week    Attends Religious Services: More than 4 times per year    Active Member of Clubs or Organizations: No    Attends Banker Meetings: Never    Marital Status: Never married  Intimate Partner Violence: Not At Risk (03/04/2023)   Humiliation, Afraid, Rape, and Kick questionnaire    Fear of Current or Ex-Partner: No    Emotionally Abused: No  Physically Abused: No    Sexually Abused: No     Family History  Problem Relation Age of Onset   Cancer Mother    Heart disease Father     ROS: Otherwise negative unless mentioned in HPI  Physical Examination  Vitals:   03/05/23 0749 03/05/23 1245  BP: (!) 115/91 108/78  Pulse: 86   Resp: 14 18  Temp: 98.1 F (36.7 C) 97.8 F (36.6 C)  SpO2: 100%    Body mass index is 19.14 kg/m.  General:  no acute distress, alert and oriented x4 Gait: Not observed HENT: WNL, normocephalic Pulmonary: normal non-labored breathing Cardiac: regular Abdomen:  soft, NT/ND, no masses Skin: without rashes Vascular Exam/Pulses: 2+ left femoral pulse. 1+ right femoral pulse. Nonpalpable pedal pulses on the right. Brisk right AT doppler signal. Monophasic right PT/Peroneal doppler signals. Right foot slightly cooler than the left Extremities: without ischemic changes, without Gangrene , without cellulitis; without open wounds;  Musculoskeletal: no muscle wasting or atrophy  Neurologic: A&O X 3;  No focal weakness or paresthesias are detected; speech is fluent/normal Psychiatric:  The pt has Normal affect. Lymph:  Unremarkable  CBC    Component Value Date/Time   WBC 10.0 03/05/2023 0322   RBC 3.96 03/05/2023 0322   HGB 10.9 (L) 03/05/2023 0322   HGB 14.4 03/03/2021 0954    HCT 35.6 (L) 03/05/2023 0322   HCT 44.3 03/03/2021 0954   PLT 311 03/05/2023 0322   PLT 378 03/03/2021 0954   MCV 89.9 03/05/2023 0322   MCV 87 03/03/2021 0954   MCH 27.5 03/05/2023 0322   MCHC 30.6 03/05/2023 0322   RDW 13.2 03/05/2023 0322   RDW 11.3 (L) 03/03/2021 0954   LYMPHSABS 1.4 03/03/2023 1703   MONOABS 0.5 03/03/2023 1703   EOSABS 0.1 03/03/2023 1703   BASOSABS 0.0 03/03/2023 1703    BMET    Component Value Date/Time   NA 137 03/05/2023 0322   NA 144 04/17/2022 0841   K 4.3 03/05/2023 0322   CL 109 03/05/2023 0322   CO2 20 (L) 03/05/2023 0322   GLUCOSE 108 (H) 03/05/2023 0322   BUN 15 03/05/2023 0322   BUN 27 04/17/2022 0841   CREATININE 0.81 03/05/2023 0322   CALCIUM 9.2 03/05/2023 0322   GFRNONAA >60 03/05/2023 0322   GFRAA 81 11/16/2019 1156    COAGS: Lab Results  Component Value Date   INR 1.5 (H) 03/03/2023     Non-Invasive Vascular Imaging:   ABIs (03/04/2023) +---------+------------------+-----+-------------------+--------+  Right   Rt Pressure (mmHg)IndexWaveform           Comment   +---------+------------------+-----+-------------------+--------+  Brachial 125                    biphasic                     +---------+------------------+-----+-------------------+--------+  PTA     54                0.42 dampened monophasic          +---------+------------------+-----+-------------------+--------+  DP      78                0.60 dampened monophasic          +---------+------------------+-----+-------------------+--------+  Great Toe0                 0.00 Absent                       +---------+------------------+-----+-------------------+--------+   +---------+------------------+-----+----------+-------+  Left    Lt Pressure (mmHg)IndexWaveform  Comment  +---------+------------------+-----+----------+-------+  Brachial 130                    triphasic           +---------+------------------+-----+----------+-------+  PTA     144               1.11 monophasic         +---------+------------------+-----+----------+-------+  DP      139               1.07 monophasic         +---------+------------------+-----+----------+-------+  Great Toe103               0.79 Normal             +---------+------------------+-----+----------+-------+   +-------+-----------+-----------+------------+------------+  ABI/TBIToday's ABIToday's TBIPrevious ABIPrevious TBI  +-------+-----------+-----------+------------+------------+  Right 0.6        0                                    +-------+-----------+-----------+------------+------------+  Left  1.11       0.79                                 +-------+-----------+-----------+------------+------------+    ASSESSMENT/PLAN: This is a 63 y.o. female with concern for right foot rest pain   -The patient was admitted to [facility] two days ago with lactic acidosis and concern for sepsis. She has a recent dental infection. Upon admission the patient was also endorsing right foot pain -The patient states she has had shooting pains in her toes on the right for over a year. For the past 2 weeks her pain has worsened and extended onto the top of her foot. Her pain wakes her from her sleep. Her pain is also worsened by touch and pressure -On exam she has a diminished right femoral pulse and nonpalpable pedal pulses. She has monophasic AT, PT, and peroneal doppler signals. She has no tissue loss -Studies demonstrate diminished flow on the right with an ABI of 0.6 and toe pressure of 0 -Based on the patient's arterial studies and history, she likely has critical limb ischemia with rest pain. I have explained to the patient that she would benefit from right lower extremity angiogram to improve her blood flow. She is aware she may also need future surgery pending her angiogram results. She is  agreeable to an angiogram -She has a history of a-fib and is currently anticoagulated on Eliquis. This will have to be held for at least 2 days prior to angiogram. Angiogram timing pending schedule  -Dr.Everlynn Sagun will evaluate the patient this afternoon   Loel Dubonnet PA-C [facility] [phone]   I have independently interviewed and examined patient and agree with PA assessment and plan above.  Patient currently on Eliquis will need to be held and will plan for angiography this Thursday with Dr. Chestine Spore.  She does have a palpable left common femoral pulse no palpable right common femoral pulse and as such I have ordered right lower extremity arterial duplex for more in-depth noninvasive hemodynamic evaluation.  I discussed proceeding with the patient and she demonstrates good understanding.  Erleen Egner C. Randie Heinz, MD [facility] [place] Office: [phone] Pager: [phone]

## 2023-03-05 NOTE — Plan of Care (Signed)
  Problem: Activity: Goal: Risk for activity intolerance will decrease Outcome: Progressing   Problem: Nutrition: Goal: Adequate nutrition will be maintained Outcome: Progressing   Problem: Pain Managment: Goal: General experience of comfort will improve and/or be controlled Outcome: Progressing

## 2023-03-05 NOTE — Plan of Care (Signed)
Patient will have a vascular study tomorrow.  Doing well.

## 2023-03-05 NOTE — Progress Notes (Signed)
PROGRESS NOTE    Joanna [name]  UJW:[id number] DOB: [birth year] DOA: 03/03/2023 PCP: Marcine Matar, MD   Brief Narrative:  This 63 yrs old female with PMH significant of A-fib, CHF, hypertension who presented to the ED with c/o: 1 month of fatigue, lightheadedness. Patient was recently treated for a dental infection. She completed the prescription however developed fever and chills. She was unable to get out of bed. She was also endorsing foot pain. EMS was called and she was found to be hypotensive with systolic in the 80s. She was brought to the ER where she was afebrile and hemodynamically stable.  Patient underwent x-ray of chest which showed chronic findings,  X-ray Right foot showed no acute fracture.  Patient was started on broad-spectrum antibiotics due to the lactic acidosis and concern for sepsis.  She was admitted for further evaluation.  Assessment & Plan:   Active Problems:   Sepsis (HCC)  Sepsis likely due to suspected pneumonia: Patient presented with tachycardia, tachypnea, lactic acidosis,  hypotension. Lactic acid 3.2, improved with IV hydration. Continue gentle volume resuscitation. Continue empiric antibiotics (vancomycin and cefepime). Blood and urine cultures no growth so far. CT chest showed mild patchy airspace opacity right lower lobe worrisome for pneumonia. Sepsis physiology improving.  Right foot pain: Patient has palpable pulses however foot feels cold. Patient endorsing pain with movement and claudication with walking ABI index abnormal on the right side.  Vascular surgery consulted. Poor perfusion could be due to cardiogenic shock.  COPD: No acute exacerbation. Continue home inhalers.   History of paroxysmal A-fib: Continue Tikosyn and Eliquis. Heart rate controlled. Continue Coreg.  History of heart failure reduced ejection fraction: Patient taking Lasix at home.  Last echocardiogram was obtained on 1/2 which showed EF 40 to 45%.  Continue  to monitor volume status in setting of resuscitation.  Hold spironolactone and Entresto due to hypotension.   Essential Hypertension:  Continue Coreg.   Hyperthyroidism: Continue methimazole  Hypomagnesemia: Replaced.  Continue to monitor  Acute kidney injury: Likely due to sepsis. Creatinine 1.32, baseline normal. Avoid nephrotoxic medications. Monitor serum creatinine.  Splenic lesion/bilateral adrenal nodules: Follow-up outpatient  DVT prophylaxis: Eliquis Code Status: Full code Family Communication:No family at bed side. Disposition Plan:  Status is: Inpatient Remains inpatient appropriate because: Admitted for sepsis due to unknown etiology.   Consultants:  Vascular surgery  Procedures: None  Antimicrobials: Anti-infectives (From admission, onward)    Start     Dose/Rate Route Frequency Ordered Stop   03/04/23 2200  vancomycin (VANCOCIN) IVPB 1000 mg/200 mL premix        1,000 mg 200 mL/hr over 60 Minutes Intravenous Every 24 hours 03/03/23 2144     03/03/23 2130  vancomycin (VANCOREADY) IVPB 1250 mg/250 mL        1,250 mg 166.7 mL/hr over 90 Minutes Intravenous  Once 03/03/23 2124 03/04/23 0056   03/03/23 2130  ceFEPIme (MAXIPIME) 2 g in sodium chloride 0.9 % 100 mL IVPB        2 g 200 mL/hr over 30 Minutes Intravenous Every 12 hours 03/03/23 2124         Subjective: Patient was seen and examined at bedside.  Overnight events noted. Patient reports feeling better,  She still reports having right sharp foot pain.  Objective: Vitals:   03/05/23 0302 03/05/23 0600 03/05/23 0749 03/05/23 1245  BP: 111/70  (!) 115/91 108/78  Pulse: 69  86   Resp: 16 19 14 18   Temp: 98.2 F (36.8  C)  98.1 F (36.7 C) 97.8 F (36.6 C)  TempSrc: Oral  Oral Oral  SpO2: 99%  100%   Weight:      Height:        Intake/Output Summary (Last 24 hours) at 03/05/2023 1408 Last data filed at 03/05/2023 1000 Gross per 24 hour  Intake 1908.24 ml  Output 1100 ml  Net 808.24 ml    Filed Weights   03/03/23 1659  Weight: 64 kg    Examination:  General exam: Appears comfortable, thin, deconditioned, not in any acute distress. Respiratory system: CTA bilaterally. Respiratory effort normal.  RR 15 Cardiovascular system: S1 & S2 heard, RRR. No JVD, murmurs, rubs, gallops or clicks.  Gastrointestinal system: Abdomen is non distended, soft and non tender. Normal bowel sounds heard. Central nervous system: Alert and oriented x 3. No focal neurological deficits. Extremities: No edema, no cyanosis, no clubbing Skin: No rashes, lesions or ulcers Psychiatry: Judgement and insight appear normal. Mood & affect appropriate.     Data Reviewed: I have personally reviewed following labs and imaging studies  CBC: Recent Labs  Lab 03/03/23 1703 03/04/23 0415 03/05/23 0322  WBC 9.3 6.4 10.0  NEUTROABS 7.2  --   --   HGB 11.8* 10.1* 10.9*  HCT 40.6 33.5* 35.6*  MCV 95.8 91.8 89.9  PLT 336 318 311   Basic Metabolic Panel: Recent Labs  Lab 03/03/23 1703 03/04/23 0415 03/04/23 1750 03/05/23 0322  NA 138 135 139 137  K 4.7 3.6 4.4 4.3  CL 108 106 111 109  CO2 14* 20* 19* 20*  GLUCOSE 98 152* 97 108*  BUN 26* 27* 19 15  CREATININE 1.32* 1.04* 0.80 0.81  CALCIUM 9.0 8.7* 8.4* 9.2  MG  --  1.6* 2.2 1.9  PHOS  --   --   --  3.0   GFR: Estimated Creatinine Clearance: 71.8 mL/min (by C-G formula based on SCr of 0.81 mg/dL). Liver Function Tests: Recent Labs  Lab 03/03/23 1703  AST 12*  ALT 8  ALKPHOS 54  BILITOT 0.8  PROT 6.8  ALBUMIN 3.1*   No results for input(s): "LIPASE", "AMYLASE" in the last 168 hours. No results for input(s): "AMMONIA" in the last 168 hours. Coagulation Profile: Recent Labs  Lab 03/03/23 1830  INR 1.5*   Cardiac Enzymes: No results for input(s): "CKTOTAL", "CKMB", "CKMBINDEX", "TROPONINI" in the last 168 hours. BNP (last 3 results) No results for input(s): "PROBNP" in the last 8760 hours. HbA1C: No results for input(s):  "HGBA1C" in the last 72 hours. CBG: No results for input(s): "GLUCAP" in the last 168 hours. Lipid Profile: No results for input(s): "CHOL", "HDL", "LDLCALC", "TRIG", "CHOLHDL", "LDLDIRECT" in the last 72 hours. Thyroid Function Tests: No results for input(s): "TSH", "T4TOTAL", "FREET4", "T3FREE", "THYROIDAB" in the last 72 hours. Anemia Panel: No results for input(s): "VITAMINB12", "FOLATE", "FERRITIN", "TIBC", "IRON", "RETICCTPCT" in the last 72 hours. Sepsis Labs: Recent Labs  Lab 03/03/23 1722 03/03/23 1938  LATICACIDVEN 3.2* 1.4    Recent Results (from the past 240 hours)  Resp panel by RT-PCR (RSV, Flu A&B, Covid) Anterior Nasal Swab     Status: None   Collection Time: 03/03/23  6:13 PM   Specimen: Anterior Nasal Swab  Result Value Ref Range Status   SARS Coronavirus 2 by RT PCR NEGATIVE NEGATIVE Final   Influenza A by PCR NEGATIVE NEGATIVE Final   Influenza B by PCR NEGATIVE NEGATIVE Final    Comment: (NOTE) The Xpert Xpress SARS-CoV-2/FLU/RSV plus  assay is intended as an aid in the diagnosis of influenza from Nasopharyngeal swab specimens and should not be used as a sole basis for treatment. Nasal washings and aspirates are unacceptable for Xpert Xpress SARS-CoV-2/FLU/RSV testing.  Fact Sheet for Patients: BloggerCourse.com  Fact Sheet for Healthcare Providers: SeriousBroker.it  This test is not yet approved or cleared by the [place] FDA and has been authorized for detection and/or diagnosis of SARS-CoV-2 by FDA under an Emergency Use Authorization (EUA). This EUA will remain in effect (meaning this test can be used) for the duration of the COVID-19 declaration under Section 564(b)(1) of the Act, 21 U.S.C. section 360bbb-3(b)(1), unless the authorization is terminated or revoked.     Resp Syncytial Virus by PCR NEGATIVE NEGATIVE Final    Comment: (NOTE) Fact Sheet for  Patients: BloggerCourse.com  Fact Sheet for Healthcare Providers: SeriousBroker.it  This test is not yet approved or cleared by the [place] FDA and has been authorized for detection and/or diagnosis of SARS-CoV-2 by FDA under an Emergency Use Authorization (EUA). This EUA will remain in effect (meaning this test can be used) for the duration of the COVID-19 declaration under Section 564(b)(1) of the Act, 21 U.S.C. section 360bbb-3(b)(1), unless the authorization is terminated or revoked.  Performed at [facility], [address] [place], [place] [address]   Blood Culture (routine x 2)     Status: None (Preliminary result)   Collection Time: 03/03/23  6:30 PM   Specimen: BLOOD RIGHT HAND  Result Value Ref Range Status   Specimen Description BLOOD RIGHT HAND  Final   Special Requests   Final    BOTTLES DRAWN AEROBIC AND ANAEROBIC Blood Culture results may not be optimal due to an inadequate volume of blood received in culture bottles   Culture   Final    NO GROWTH 2 DAYS Performed at [facility], [address] [place], [place] [address]    Report Status PENDING  Incomplete  Urine Culture     Status: None   Collection Time: 03/04/23 12:23 PM   Specimen: Urine, Random  Result Value Ref Range Status   Specimen Description URINE, RANDOM  Final   Special Requests NONE Reflexed from [id number]  Final   Culture   Final    NO GROWTH Performed at [facility], [address], [place], [place] [address]    Report Status 03/05/2023 FINAL  Final  MRSA Next Gen by PCR, Nasal     Status: None   Collection Time: 03/05/23 11:01 AM   Specimen: Nasal Mucosa; Nasal Swab  Result Value Ref Range Status   MRSA by PCR Next Gen NOT DETECTED NOT DETECTED Final    Comment: (NOTE) The GeneXpert MRSA Assay (FDA approved for NASAL specimens only), is one component of a comprehensive MRSA colonization surveillance program. It  is not intended to diagnose MRSA infection nor to guide or monitor treatment for MRSA infections. Test performance is not FDA approved in patients less than 56 years old. Performed at [facility], [address] [place], [place] [address]    Radiology Studies: VAS [place] ABI WITH/WO TBI Result Date: 03/04/2023  LOWER EXTREMITY DOPPLER STUDY Patient Name:  ROBINETTE ESTERS  Date of Exam:   03/04/2023 Medical Rec #: [id number]        Accession #:    [id number] Date of Birth: 1960-05-28        Patient Gender: F Patient Age:   22 years  Exam Location:  [facility] Procedure:      VAS [place] ABI WITH/WO TBI Referring Phys: Alan Mulder --------------------------------------------------------------------------------  Indications: Claudication, and rest pain. High Risk Factors: Hypertension.  Performing Technologist: Shona Simpson  Examination Guidelines: A complete evaluation includes at minimum, Doppler waveform signals and systolic blood pressure reading at the level of bilateral brachial, anterior tibial, and posterior tibial arteries, when vessel segments are accessible. Bilateral testing is considered an integral part of a complete examination. Photoelectric Plethysmograph (PPG) waveforms and toe systolic pressure readings are included as required and additional duplex testing as needed. Limited examinations for reoccurring indications may be performed as noted.  ABI Findings: +---------+------------------+-----+-------------------+--------+ Right    Rt Pressure (mmHg)IndexWaveform           Comment  +---------+------------------+-----+-------------------+--------+ Brachial 125                    biphasic                    +---------+------------------+-----+-------------------+--------+ PTA      54                0.42 dampened monophasic         +---------+------------------+-----+-------------------+--------+ DP       78                0.60 dampened monophasic          +---------+------------------+-----+-------------------+--------+ Great Toe0                 0.00 Absent                      +---------+------------------+-----+-------------------+--------+ +---------+------------------+-----+----------+-------+ Left     Lt Pressure (mmHg)IndexWaveform  Comment +---------+------------------+-----+----------+-------+ Brachial 130                    triphasic         +---------+------------------+-----+----------+-------+ PTA      144               1.11 monophasic        +---------+------------------+-----+----------+-------+ DP       139               1.07 monophasic        +---------+------------------+-----+----------+-------+ Great Toe103               0.79 Normal            +---------+------------------+-----+----------+-------+ +-------+-----------+-----------+------------+------------+ ABI/TBIToday's ABIToday's TBIPrevious ABIPrevious TBI +-------+-----------+-----------+------------+------------+ Right  0.6        0                                   +-------+-----------+-----------+------------+------------+ Left   1.11       0.79                                +-------+-----------+-----------+------------+------------+  Summary: Right: Resting right ankle-brachial index indicates moderate right lower extremity arterial disease. The right toe-brachial index is abnormal. Left: Resting left ankle-brachial index is within normal range. The left toe-brachial index is normal. Although ankle brachial indices are within normal limits (0.95-1.29), arterial Doppler waveforms at the ankle suggest some component of arterial occlusive disease. *See table(s) above for measurements and observations.  Electronically signed by Gerarda Fraction on 03/04/2023 at 5:19:00 PM.    Final  CT CHEST ABDOMEN PELVIS WO CONTRAST Result Date: 03/04/2023 CLINICAL DATA:  Sepsis. EXAM: CT CHEST, ABDOMEN AND PELVIS WITHOUT CONTRAST TECHNIQUE:  Multidetector CT imaging of the chest, abdomen and pelvis was performed following the standard protocol without IV contrast. RADIATION DOSE REDUCTION: This exam was performed according to the departmental dose-optimization program which includes automated exposure control, adjustment of the mA and/or kV according to patient size and/or use of iterative reconstruction technique. COMPARISON:  CT angiogram chest 08/29/2005 FINDINGS: CT CHEST FINDINGS Cardiovascular: Heart and aorta are normal in size. There are atherosclerotic calcifications in the aorta. There is a trace pericardial effusion. Mediastinum/Nodes: Stable mildly enlarged precarinal lymph node. No other enlarged lymph nodes are seen allowing for lack of intravenous contrast. Visualized esophagus and thyroid gland are within normal limits. Lungs/Pleura: Paraseptal emphysematous changes are mild-to-moderate and have increased in the upper lungs. There is mild patchy airspace opacity in the posteroinferior right lower lobe. There is no pleural effusion or pneumothorax. Trachea and central airways are patent. There are calcified granulomas in the right lower lung. Musculoskeletal: No chest wall mass or suspicious bone lesions identified. CT ABDOMEN PELVIS FINDINGS Hepatobiliary: No focal liver abnormality is seen. No gallstones, gallbladder wall thickening, or biliary dilatation. Pancreas: Unremarkable. No pancreatic ductal dilatation or surrounding inflammatory changes. Spleen: There is an indeterminate hypodense lesion in the anterior spleen measuring 2.4 x 2.7 cm. The spleen is normal in size. Adrenals/Urinary Tract: Bilateral adrenal nodules are present, indeterminate. Right adrenal nodule measures up to 17 mm and left adrenal nodule measures up to 2.4 cm. There is a single punctate calculus in the right kidney. There is no definite hydronephrosis. There is mild bilateral perinephric fat stranding, nonspecific. The bladder is within normal limits.  Stomach/Bowel: Stomach is within normal limits. Appendix is not seen. No evidence of bowel wall thickening, distention, or inflammatory changes. Vascular/Lymphatic: Aortic atherosclerosis. No enlarged abdominal or pelvic lymph nodes. Reproductive: Uterus is heterogeneous, lobulated and mildly enlarged containing multiple calcifications, likely due to fibroids. Calcified fibroids measure up to 16 mm. The ovaries are not well delineated on this study. Other: There is no ascites.  There is mild body wall edema. Musculoskeletal: Degenerative changes affect the spine. There also severe degenerative changes of the right hip. IMPRESSION: 1. Mild patchy airspace opacity in the right lower lobe worrisome for pneumonia. 2. Emphysema. 3. Trace pericardial effusion. 4. Indeterminate hypodense lesion in the spleen. This can be further evaluated with MRI. 5. Bilateral adrenal nodules, indeterminate. 6. Nonobstructing right renal calculus. 7. Fibroid uterus. 8. Mild body wall edema. Aortic Atherosclerosis (ICD10-I70.0) and Emphysema (ICD10-J43.9). Electronically Signed   By: Darliss Cheney M.D.   On: 03/04/2023 01:42   DG Foot Complete Right Result Date: 03/03/2023 CLINICAL DATA:  Right foot pain EXAM: RIGHT FOOT COMPLETE - 3 VIEW COMPARISON:  None Available. FINDINGS: There is no evidence of fracture or dislocation. There is no evidence of arthropathy or other focal bone abnormality. Soft tissues are unremarkable. IMPRESSION: No acute fracture or dislocation. Electronically Signed   By: Agustin Cree M.D.   On: 03/03/2023 19:42   DG Chest Portable 1 View Result Date: 03/03/2023 CLINICAL DATA:  Dyspnea. EXAM: PORTABLE CHEST 1 VIEW COMPARISON:  Chest radiograph dated December 20, 2021. FINDINGS: Stable cardiomegaly. Mediastinal contours are within normal limits. Aortic atherosclerosis. Bilateral interstitial coarsening. No focal consolidation, pleural effusion, or pneumothorax. No acute osseous abnormality. IMPRESSION: Stable  cardiomegaly. Bilateral interstitial coarsening could reflect chronic interstitial changes, edema, or atypical infection. Electronically Signed  By: Hart Robinsons M.D.   On: 03/03/2023 18:27   Scheduled Meds:  apixaban  5 mg Oral BID   carvedilol  3.125 mg Oral BID WC   dofetilide  125 mcg Oral BID   methimazole  5 mg Oral Daily   Continuous Infusions:  ceFEPime (MAXIPIME) IV Stopped (03/04/23 2230)   vancomycin Stopped (03/04/23 2342)     LOS: 2 days    Time spent: 35 mins    Willeen Niece, MD [facility]   If 7PM-7AM, please contact night-coverage

## 2023-03-05 NOTE — Progress Notes (Signed)
Mobility Specialist Progress Note:   03/05/23 1045  Mobility  Activity Ambulated with assistance in hallway  Level of Assistance Contact guard assist, steadying assist  Assistive Device Front wheel walker  Distance Ambulated (ft) 400 ft  Activity Response Tolerated well  Mobility Referral Yes  Mobility visit 1 Mobility  Mobility Specialist Start Time (ACUTE ONLY) 1045  Mobility Specialist Stop Time (ACUTE ONLY) 1100  Mobility Specialist Time Calculation (min) (ACUTE ONLY) 15 min   Pt received dangling EOB, eager for mobility session. Ambulated in hallway with CGA via gait belt. Required verbal cues for RW safety. Pt states " my feet feel like I've never walked on them before." Denies pain, SOB, or dizziness. Tolerated well, asx throughout. Returned pt to room, RN at bedside, all needs met, call bell in reach.   Feliciana Rossetti Mobility Specialist Please contact via Special educational needs teacher or  Rehab office at [phone]

## 2023-03-05 NOTE — Progress Notes (Addendum)
PHARMACY - ANTICOAGULATION CONSULT NOTE  Pharmacy Consult for heparin Indication: atrial fibrillation  No Known Allergies  Patient Measurements: Height: 6' (182.9 cm) Weight: 64 kg (141 lb 1.5 oz) IBW/kg (Calculated) : 73.1 Heparin Dosing Weight: 64kg  Vital Signs: Temp: 97.8 F (36.6 C) (01/28 1245) Temp Source: Oral (01/28 1245) BP: 108/78 (01/28 1245) Pulse Rate: 86 (01/28 0749)  Labs: Recent Labs    03/03/23 1703 03/03/23 1830 03/03/23 2002 03/04/23 0415 03/04/23 1750 03/05/23 0322  HGB 11.8*  --   --  10.1*  --  10.9*  HCT 40.6  --   --  33.5*  --  35.6*  PLT 336  --   --  318  --  311  APTT  --  32  --   --   --   --   LABPROT  --  17.9*  --   --   --   --   INR  --  1.5*  --   --   --   --   CREATININE 1.32*  --   --  1.04* 0.80 0.81  TROPONINIHS  --   --  12  --  14  --     Estimated Creatinine Clearance: 71.8 mL/min (by C-G formula based on SCr of 0.81 mg/dL).   Medical History: Past Medical History:  Diagnosis Date   Atrial fibrillation with RVR (HCC)    Chronic combined systolic (congestive) and diastolic (congestive) heart failure (HCC)    Dyspnea    Goiter    Hypertension    NICM (nonischemic cardiomyopathy) (HCC)    a. 08/2005 Echo: EF 30-35%, mod diff HK. Mild to Mod MR. Mildly dil LA; b. 08/2005 Cath: Nl Cors. Elevated CO w/o shunt; c. 09/2007 Echo: EF 45%. Mild to mod MR; c. 03/2015 Echo: EF 45%, global HK. Gr1 DD. Mild MR. Mildly dil LA.      Assessment: 63 yo W with afib on apixaban PTA, now held for angiogram 1/30. Last dose 1/28 at 9:30 am. Pharmacy consulted for heparin.    Goal of Therapy:  Heparin level 0.3-0.7 units/ml aPTT 66-102 seconds Monitor platelets by anticoagulation protocol: Yes   Plan:  Start heparin 900 units/hr at 2100, no bolus  F/u aPTT until correlates with heparin level  Monitor daily aPTT, heparin level, CBC, signs/symptoms of bleeding  F/u restart apixaban   Alphia Moh, PharmD, BCPS, [facility] Clinical  Pharmacist  Please check AMION for all [facility] Pharmacy phone numbers After 10:00 PM, call Main Pharmacy [phone]

## 2023-03-05 NOTE — Progress Notes (Signed)
Mobility Specialist Progress Note;   03/05/23 1550  Mobility  Activity Ambulated with assistance in hallway  Level of Assistance Contact guard assist, steadying assist  Assistive Device Front wheel walker  Distance Ambulated (ft) 300 ft  Activity Response Tolerated well  Mobility Referral Yes  Mobility visit 1 Mobility  Mobility Specialist Start Time (ACUTE ONLY) 1550  Mobility Specialist Stop Time (ACUTE ONLY) 1605  Mobility Specialist Time Calculation (min) (ACUTE ONLY) 15 min   Pt eager for more mobility. Required MinG assistance throughout ambulation for safety. HR up to 117 w/ activity. No c/o during session. Pt returned back to EOB eating dinner with all needs met.   Caesar Bookman Mobility Specialist Please contact via SecureChat or [employer] [phone]

## 2023-03-06 ENCOUNTER — Inpatient Hospital Stay (HOSPITAL_COMMUNITY): Payer: 59

## 2023-03-06 DIAGNOSIS — I7090 Unspecified atherosclerosis: Secondary | ICD-10-CM

## 2023-03-06 DIAGNOSIS — I739 Peripheral vascular disease, unspecified: Secondary | ICD-10-CM

## 2023-03-06 DIAGNOSIS — I70221 Atherosclerosis of native arteries of extremities with rest pain, right leg: Secondary | ICD-10-CM | POA: Diagnosis not present

## 2023-03-06 LAB — CBC
HCT: 31 % — ABNORMAL LOW (ref 36.0–46.0)
Hemoglobin: 9.8 g/dL — ABNORMAL LOW (ref 12.0–15.0)
MCH: 27.8 pg (ref 26.0–34.0)
MCHC: 31.6 g/dL (ref 30.0–36.0)
MCV: 88.1 fL (ref 80.0–100.0)
Platelets: 282 10*3/uL (ref 150–400)
RBC: 3.52 MIL/uL — ABNORMAL LOW (ref 3.87–5.11)
RDW: 13.2 % (ref 11.5–15.5)
WBC: 12.3 10*3/uL — ABNORMAL HIGH (ref 4.0–10.5)
nRBC: 0 % (ref 0.0–0.2)

## 2023-03-06 LAB — BASIC METABOLIC PANEL
Anion gap: 10 (ref 5–15)
BUN: 13 mg/dL (ref 8–23)
CO2: 21 mmol/L — ABNORMAL LOW (ref 22–32)
Calcium: 8.9 mg/dL (ref 8.9–10.3)
Chloride: 104 mmol/L (ref 98–111)
Creatinine, Ser: 0.89 mg/dL (ref 0.44–1.00)
GFR, Estimated: >60 mL/min (ref >60)
Glucose, Bld: 113 mg/dL — ABNORMAL HIGH (ref 70–99)
Potassium: 3.7 mmol/L (ref 3.5–5.1)
Sodium: 135 mmol/L (ref 135–145)

## 2023-03-06 LAB — APTT
aPTT: 45 s — ABNORMAL HIGH (ref 24–36)
aPTT: 46 s — ABNORMAL HIGH (ref 24–36)

## 2023-03-06 LAB — HEPARIN LEVEL (UNFRACTIONATED): Heparin Unfractionated: 1.03 [IU]/mL — ABNORMAL HIGH (ref 0.30–0.70)

## 2023-03-06 LAB — MAGNESIUM: Magnesium: 1.7 mg/dL (ref 1.7–2.4)

## 2023-03-06 MED ORDER — MAGNESIUM SULFATE 2 GM/50ML IV SOLN
2.0000 g | Freq: Once | INTRAVENOUS | Status: AC
  Administered 2023-03-06: 2 g via INTRAVENOUS
  Filled 2023-03-06: qty 50

## 2023-03-06 MED ORDER — OXYCODONE HCL 5 MG PO TABS
5.0000 mg | ORAL_TABLET | Freq: Two times a day (BID) | ORAL | Status: DC | PRN
  Administered 2023-03-06 – 2023-03-08 (×4): 5 mg via ORAL
  Filled 2023-03-06 (×3): qty 1

## 2023-03-06 MED ORDER — SODIUM CHLORIDE 0.9 % IV SOLN
2.0000 g | INTRAVENOUS | Status: DC
  Administered 2023-03-06 – 2023-03-08 (×3): 2 g via INTRAVENOUS
  Filled 2023-03-06 (×3): qty 20

## 2023-03-06 MED ORDER — POTASSIUM CHLORIDE CRYS ER 20 MEQ PO TBCR
40.0000 meq | EXTENDED_RELEASE_TABLET | Freq: Once | ORAL | Status: AC
  Administered 2023-03-06: 40 meq via ORAL
  Filled 2023-03-06: qty 2

## 2023-03-06 NOTE — Progress Notes (Addendum)
  Progress Note    03/06/2023 7:48 AM * No surgery found *  Subjective:  rest pain R foot   Vitals:   03/06/23 0427 03/06/23 0719  BP: 110/78 120/71  Pulse: 86 80  Resp: 20 19  Temp: 99.6 F (37.6 C) 98 F (36.7 C)  SpO2: 96%    Physical Exam: Lungs:  non labored Extremities:  R PT by doppler Neurologic: A&O  CBC    Component Value Date/Time   WBC 12.3 (H) 03/06/2023 0418   RBC 3.52 (L) 03/06/2023 0418   HGB 9.8 (L) 03/06/2023 0418   HGB 14.4 03/03/2021 0954   HCT 31.0 (L) 03/06/2023 0418   HCT 44.3 03/03/2021 0954   PLT 282 03/06/2023 0418   PLT 378 03/03/2021 0954   MCV 88.1 03/06/2023 0418   MCV 87 03/03/2021 0954   MCH 27.8 03/06/2023 0418   MCHC 31.6 03/06/2023 0418   RDW 13.2 03/06/2023 0418   RDW 11.3 (L) 03/03/2021 0954   LYMPHSABS 1.4 03/03/2023 1703   MONOABS 0.5 03/03/2023 1703   EOSABS 0.1 03/03/2023 1703   BASOSABS 0.0 03/03/2023 1703    BMET    Component Value Date/Time   NA 135 03/06/2023 0418   NA 144 04/17/2022 0841   K 3.7 03/06/2023 0418   CL 104 03/06/2023 0418   CO2 21 (L) 03/06/2023 0418   GLUCOSE 113 (H) 03/06/2023 0418   BUN 13 03/06/2023 0418   BUN 27 04/17/2022 0841   CREATININE 0.89 03/06/2023 0418   CALCIUM 8.9 03/06/2023 0418   GFRNONAA >60 03/06/2023 0418   GFRAA 81 11/16/2019 1156    INR    Component Value Date/Time   INR 1.5 (H) 03/03/2023 1830     Intake/Output Summary (Last 24 hours) at 03/06/2023 0748 Last data filed at 03/06/2023 0228 Gross per 24 hour  Intake 720 ml  Output 1050 ml  Net -330 ml     Assessment/Plan:  64 y.o. female with critical limb ischemia of right leg with rest pain  Subjectively, pain in R foot overnight; PTA has best signal by doppler Continue heparin; continue to hold Eliquis Plan is for aortogram with right leg runoff and possible intervention tomorrow in cath lab with Dr. Chestine Spore NPO past midnight Consent ordered   Emilie Rutter, PA-C [facility] [phone] 03/06/2023 7:48 AM   I have interviewed and examined patient with PA and agree with assessment and plan above.   Aidah Forquer C. Randie Heinz, MD [facility] [place] Office: [phone] Pager: [phone]

## 2023-03-06 NOTE — Progress Notes (Signed)
PHARMACY - ANTICOAGULATION CONSULT NOTE  Pharmacy Consult for heparin Indication: atrial fibrillation  No Known Allergies  Patient Measurements: Height: 6' (182.9 cm) Weight: 64 kg (141 lb 1.5 oz) IBW/kg (Calculated) : 73.1 Heparin Dosing Weight: 64kg  Vital Signs: Temp: 98 F (36.7 C) (01/29 0719) Temp Source: Oral (01/29 0719) BP: 120/71 (01/29 0719) Pulse Rate: 80 (01/29 0719)  Labs: Recent Labs    03/03/23 1830 03/03/23 2002 03/04/23 0415 03/04/23 1750 03/05/23 0322 03/06/23 0418 03/06/23 1322  HGB  --   --  10.1*  --  10.9* 9.8*  --   HCT  --   --  33.5*  --  35.6* 31.0*  --   PLT  --   --  318  --  311 282  --   APTT 32  --   --   --   --   --  45*  LABPROT 17.9*  --   --   --   --   --   --   INR 1.5*  --   --   --   --   --   --   HEPARINUNFRC  --   --   --   --   --  1.03*  --   CREATININE  --   --  1.04* 0.80 0.81 0.89  --   TROPONINIHS  --  12  --  14  --   --   --     Estimated Creatinine Clearance: 65.4 mL/min (by C-G formula based on SCr of 0.89 mg/dL).   Medical History: Past Medical History:  Diagnosis Date   Atrial fibrillation with RVR (HCC)    Chronic combined systolic (congestive) and diastolic (congestive) heart failure (HCC)    Dyspnea    Goiter    Hypertension    NICM (nonischemic cardiomyopathy) (HCC)    a. 08/2005 Echo: EF 30-35%, mod diff HK. Mild to Mod MR. Mildly dil LA; b. 08/2005 Cath: Nl Cors. Elevated CO w/o shunt; c. 09/2007 Echo: EF 45%. Mild to mod MR; c. 03/2015 Echo: EF 45%, global HK. Gr1 DD. Mild MR. Mildly dil LA.      Assessment: 63 yo W with afib on apixaban PTA, now held for angiogram 1/30. Last dose 1/28 at 9:30 am. Pharmacy consulted for heparin. Vascular service seeing and plans for angiogram on 1/29  -heparin level 1.03 (due to recent apixaban) -aPTT= 45  Goal of Therapy:  Heparin level 0.3-0.7 units/ml aPTT 66-102 seconds Monitor platelets by anticoagulation protocol: Yes   Plan:  Increase heparin to 1100  units.hr aPTT in 6 hrs F/u aPTT until correlates with heparin level  Monitor daily aPTT, heparin level, CBC, signs/symptoms of bleeding  F/u restart apixaban post procedure

## 2023-03-06 NOTE — Progress Notes (Signed)
Mobility Specialist Progress Note:   03/06/23 0945  Mobility  Activity Transferred from bed to chair  Level of Assistance Minimal assist, patient does 75% or more  Assistive Device Front wheel walker  Distance Ambulated (ft) 4 ft  Activity Response Tolerated well  Mobility Referral Yes  Mobility visit 1 Mobility  Mobility Specialist Start Time (ACUTE ONLY) 0945  Mobility Specialist Stop Time (ACUTE ONLY) 0955  Mobility Specialist Time Calculation (min) (ACUTE ONLY) 10 min   Pt received in bed, agreeable to mobility session. Pt c/o 10/10 R foot pain, deferred ambulation in hallway. Pt agreeable to transfer to chair. Required MinA via RW, pt grimacing whole transfer. Left in chair with legs elevated, alarm on, call bell in reach. All needs met.   Feliciana Rossetti Mobility Specialist Please contact via Special educational needs teacher or  Rehab office at [phone]

## 2023-03-06 NOTE — Progress Notes (Signed)
PROGRESS NOTE    Joanna [name]  ZOX:[id number] DOB: Jan 12, 1961 DOA: 03/03/2023 PCP: Marcine Matar, MD   Brief Narrative:  This 63 yrs old female with PMH significant of A-fib, CHF, hypertension who presented to the ED with c/o: 1 month of fatigue, lightheadedness. Patient was recently treated for a dental infection. She completed the prescription however developed fever and chills. She was unable to get out of bed. She was also endorsing foot pain. EMS was called and she was found to be hypotensive with systolic in the 80s. She was brought to the ER where she was afebrile and hemodynamically stable.  Patient underwent x-ray of chest which showed chronic findings,  X-ray Right foot showed no acute fracture.  Patient was started on broad-spectrum antibiotics due to the lactic acidosis and concern for sepsis.  She was admitted for further evaluation.  Assessment & Plan:   Active Problems:   Sepsis (HCC)  Sepsis likely due to suspected pneumonia: Patient presented with tachycardia, tachypnea, lactic acidosis,  hypotension. Lactic acid 3.2, improved with IV hydration. Continue gentle volume resuscitation. Continue empiric antibiotics (vancomycin and cefepime). Blood and urine cultures no growth so far. CT chest showed mild patchy airspace opacity right lower lobe worrisome for pneumonia. Sepsis physiology improving.  Right foot pain: PAD Patient has palpable pulses however foot feels cold. Patient endorsing pain with movement and claudication with walking ABI index abnormal on the right side.  Vascular surgery consulted. Poor perfusion could be due to cardiogenic shock. Patient is scheduled for aortogram with right leg runoff and possible intervention tomorrow in cath lab with Dr. Chestine Spore.   COPD: No acute exacerbation. Continue home inhalers.   History of paroxysmal A-fib: Continue Tikosyn .  Eliquis kept on hold. Heart rate controlled. Continue Coreg. Continue IV heparin in  the meantime.  History of heart failure reduced ejection fraction: Patient taking Lasix at home.  Last echocardiogram was obtained on 1/2 which showed EF 40 to 45%.  Continue to monitor volume status in setting of resuscitation.  Hold spironolactone and Entresto due to hypotension.   Essential Hypertension:  Continue Coreg.   Hyperthyroidism: Continue methimazole  Hypomagnesemia: Replaced.  Continue to monitor  Acute kidney injury: > Resolved Likely due to sepsis. Creatinine 1.32, baseline normal. Avoid nephrotoxic medications. Renal functions normalized.  Splenic lesion/bilateral adrenal nodules: Follow-up outpatient.  DVT prophylaxis: Heparin IV Code Status: Full code Family Communication:No family at bed side. Disposition Plan:  Status is: Inpatient Remains inpatient appropriate because: Severity of illness   Consultants:  Vascular surgery  Procedures: None  Antimicrobials: Anti-infectives (From admission, onward)    Start     Dose/Rate Route Frequency Ordered Stop   03/06/23 2000  cefTRIAXone (ROCEPHIN) 2 g in sodium chloride 0.9 % 100 mL IVPB        2 g 200 mL/hr over 30 Minutes Intravenous Every 24 hours 03/06/23 0919 03/10/23 1959   03/04/23 2200  vancomycin (VANCOCIN) IVPB 1000 mg/200 mL premix  Status:  Discontinued        1,000 mg 200 mL/hr over 60 Minutes Intravenous Every 24 hours 03/03/23 2144 03/06/23 0919   03/03/23 2130  vancomycin (VANCOREADY) IVPB 1250 mg/250 mL        1,250 mg 166.7 mL/hr over 90 Minutes Intravenous  Once 03/03/23 2124 03/04/23 0056   03/03/23 2130  ceFEPIme (MAXIPIME) 2 g in sodium chloride 0.9 % 100 mL IVPB  Status:  Discontinued        2 g 200 mL/hr over 30  Minutes Intravenous Every 12 hours 03/03/23 2124 03/06/23 0919       Subjective: Patient was seen and examined at bedside.  Overnight events noted. Patient reports feeling better still reports having severe pain in the right foot.   Patient is scheduled for aortogram  with right leg runoff and possible intervention tomorrow in cath lab with Dr. Chestine Spore   Objective: Vitals:   03/05/23 2331 03/06/23 0427 03/06/23 0719 03/06/23 0800  BP:  110/78 120/71   Pulse: 78 86 80   Resp:  20 19 19   Temp: 99.6 F (37.6 C) 99.6 F (37.6 C) 98 F (36.7 C)   TempSrc: Oral Oral Oral   SpO2: 97% 96%    Weight:      Height:        Intake/Output Summary (Last 24 hours) at 03/06/2023 1434 Last data filed at 03/06/2023 0228 Gross per 24 hour  Intake 240 ml  Output 1050 ml  Net -810 ml   Filed Weights   03/03/23 1659  Weight: 64 kg    Examination:  General exam: Appears comfortable, thin, deconditioned, not in any acute distress. Respiratory system: CTA bilaterally. Respiratory effort normal.  RR 14 Cardiovascular system: S1 & S2 heard, RRR. No JVD, murmurs, rubs, gallops or clicks.  Gastrointestinal system: Abdomen is non distended, soft and non tender. Normal bowel sounds heard. Central nervous system: Alert and oriented x 3. No focal neurological deficits. Extremities: No edema, no cyanosis, no clubbing Skin: No rashes, lesions or ulcers Psychiatry: Judgement and insight appear normal. Mood & affect appropriate.     Data Reviewed: I have personally reviewed following labs and imaging studies  CBC: Recent Labs  Lab 03/03/23 1703 03/04/23 0415 03/05/23 0322 03/06/23 0418  WBC 9.3 6.4 10.0 12.3*  NEUTROABS 7.2  --   --   --   HGB 11.8* 10.1* 10.9* 9.8*  HCT 40.6 33.5* 35.6* 31.0*  MCV 95.8 91.8 89.9 88.1  PLT 336 318 311 282   Basic Metabolic Panel: Recent Labs  Lab 03/03/23 1703 03/04/23 0415 03/04/23 1750 03/05/23 0322 03/06/23 0418  NA 138 135 139 137 135  K 4.7 3.6 4.4 4.3 3.7  CL 108 106 111 109 104  CO2 14* 20* 19* 20* 21*  GLUCOSE 98 152* 97 108* 113*  BUN 26* 27* 19 15 13   CREATININE 1.32* 1.04* 0.80 0.81 0.89  CALCIUM 9.0 8.7* 8.4* 9.2 8.9  MG  --  1.6* 2.2 1.9 1.7  PHOS  --   --   --  3.0  --    GFR: Estimated  Creatinine Clearance: 65.4 mL/min (by C-G formula based on SCr of 0.89 mg/dL). Liver Function Tests: Recent Labs  Lab 03/03/23 1703  AST 12*  ALT 8  ALKPHOS 54  BILITOT 0.8  PROT 6.8  ALBUMIN 3.1*   No results for input(s): "LIPASE", "AMYLASE" in the last 168 hours. No results for input(s): "AMMONIA" in the last 168 hours. Coagulation Profile: Recent Labs  Lab 03/03/23 1830  INR 1.5*   Cardiac Enzymes: No results for input(s): "CKTOTAL", "CKMB", "CKMBINDEX", "TROPONINI" in the last 168 hours. BNP (last 3 results) No results for input(s): "PROBNP" in the last 8760 hours. HbA1C: No results for input(s): "HGBA1C" in the last 72 hours. CBG: No results for input(s): "GLUCAP" in the last 168 hours. Lipid Profile: No results for input(s): "CHOL", "HDL", "LDLCALC", "TRIG", "CHOLHDL", "LDLDIRECT" in the last 72 hours. Thyroid Function Tests: No results for input(s): "TSH", "T4TOTAL", "FREET4", "T3FREE", "THYROIDAB" in  the last 72 hours. Anemia Panel: No results for input(s): "VITAMINB12", "FOLATE", "FERRITIN", "TIBC", "IRON", "RETICCTPCT" in the last 72 hours. Sepsis Labs: Recent Labs  Lab 03/03/23 1722 03/03/23 1938  LATICACIDVEN 3.2* 1.4    Recent Results (from the past 240 hours)  Resp panel by RT-PCR (RSV, Flu A&B, Covid) Anterior Nasal Swab     Status: None   Collection Time: 03/03/23  6:13 PM   Specimen: Anterior Nasal Swab  Result Value Ref Range Status   SARS Coronavirus 2 by RT PCR NEGATIVE NEGATIVE Final   Influenza A by PCR NEGATIVE NEGATIVE Final   Influenza B by PCR NEGATIVE NEGATIVE Final    Comment: (NOTE) The Xpert Xpress SARS-CoV-2/FLU/RSV plus assay is intended as an aid in the diagnosis of influenza from Nasopharyngeal swab specimens and should not be used as a sole basis for treatment. Nasal washings and aspirates are unacceptable for Xpert Xpress SARS-CoV-2/FLU/RSV testing.  Fact Sheet for  Patients: BloggerCourse.com  Fact Sheet for Healthcare Providers: SeriousBroker.it  This test is not yet approved or cleared by the [place] FDA and has been authorized for detection and/or diagnosis of SARS-CoV-2 by FDA under an Emergency Use Authorization (EUA). This EUA will remain in effect (meaning this test can be used) for the duration of the COVID-19 declaration under Section 564(b)(1) of the Act, 21 U.S.C. section 360bbb-3(b)(1), unless the authorization is terminated or revoked.     Resp Syncytial Virus by PCR NEGATIVE NEGATIVE Final    Comment: (NOTE) Fact Sheet for Patients: BloggerCourse.com  Fact Sheet for Healthcare Providers: SeriousBroker.it  This test is not yet approved or cleared by the [place] FDA and has been authorized for detection and/or diagnosis of SARS-CoV-2 by FDA under an Emergency Use Authorization (EUA). This EUA will remain in effect (meaning this test can be used) for the duration of the COVID-19 declaration under Section 564(b)(1) of the Act, 21 U.S.C. section 360bbb-3(b)(1), unless the authorization is terminated or revoked.  Performed at [facility], [address] [place], [place] [address]   Blood Culture (routine x 2)     Status: None (Preliminary result)   Collection Time: 03/03/23  6:30 PM   Specimen: BLOOD RIGHT HAND  Result Value Ref Range Status   Specimen Description BLOOD RIGHT HAND  Final   Special Requests   Final    BOTTLES DRAWN AEROBIC AND ANAEROBIC Blood Culture results may not be optimal due to an inadequate volume of blood received in culture bottles   Culture   Final    NO GROWTH 3 DAYS Performed at [facility], [address], [place], [place] [address]    Report Status PENDING  Incomplete  Urine Culture     Status: None   Collection Time: 03/04/23 12:23 PM   Specimen: Urine, Random  Result  Value Ref Range Status   Specimen Description URINE, RANDOM  Final   Special Requests NONE Reflexed from [id number]  Final   Culture   Final    NO GROWTH Performed at [facility], [address], [place], [place] [address]    Report Status 03/05/2023 FINAL  Final  MRSA Next Gen by PCR, Nasal     Status: None   Collection Time: 03/05/23 11:01 AM   Specimen: Nasal Mucosa; Nasal Swab  Result Value Ref Range Status   MRSA by PCR Next Gen NOT DETECTED NOT DETECTED Final    Comment: (NOTE) The GeneXpert MRSA Assay (FDA approved for NASAL  specimens only), is one component of a comprehensive MRSA colonization surveillance program. It is not intended to diagnose MRSA infection nor to guide or monitor treatment for MRSA infections. Test performance is not FDA approved in patients less than 44 years old. Performed at [facility], [address], [place], [place] [address]    Radiology Studies: VAS [place] LOWER EXTREMITY ARTERIAL DUPLEX Result Date: 03/06/2023 LOWER EXTREMITY ARTERIAL DUPLEX STUDY Patient Name:  Joanna [name]  Date of Exam:   03/06/2023 Medical Rec #: [id number]        Accession #:    [id number] Date of Birth: [birth year]        Patient Gender: F Patient Age:   21 years Exam Location:  [facility] Procedure:      VAS [place] LOWER EXTREMITY ARTERIAL DUPLEX Referring Phys: Lemar Livings --------------------------------------------------------------------------------  Indications: Peripheral artery disease. High Risk Factors: Hypertension, current smoker. Other Factors: CHF, Afib.  Current ABI: RT 0.60 LT 1.11 Comparison Study: No previous exams Performing Technologist: Jody Hill RVT, RDMS  Examination Guidelines: A complete evaluation includes B-mode imaging, spectral Doppler, color Doppler, and power Doppler as needed of all accessible portions of each vessel. Bilateral testing is considered an integral part of a complete examination. Limited examinations for reoccurring  indications may be performed as noted.  +-----------+--------+-----+--------+-------------------+--------+ RIGHT      PSV cm/sRatioStenosisWaveform           Comments +-----------+--------+-----+--------+-------------------+--------+ CFA Mid    57                   monophasic                  +-----------+--------+-----+--------+-------------------+--------+ CFA Distal 54                   monophasic                  +-----------+--------+-----+--------+-------------------+--------+ DFA        40                   monophasic                  +-----------+--------+-----+--------+-------------------+--------+ SFA Prox   62                   monophasic                  +-----------+--------+-----+--------+-------------------+--------+ SFA Mid    58                   monophasic                  +-----------+--------+-----+--------+-------------------+--------+ SFA Distal 54                   monophasic                  +-----------+--------+-----+--------+-------------------+--------+ POP Prox   47                   monophasic                  +-----------+--------+-----+--------+-------------------+--------+ POP Distal 35                   monophasic                  +-----------+--------+-----+--------+-------------------+--------+ TP Trunk   35  monophasic                  +-----------+--------+-----+--------+-------------------+--------+ ATA Prox   40                   monophasic                  +-----------+--------+-----+--------+-------------------+--------+ ATA Mid    22                   monophasic                  +-----------+--------+-----+--------+-------------------+--------+ ATA Distal 14                   monophasic                  +-----------+--------+-----+--------+-------------------+--------+ PTA Prox   23                   dampened monophasic          +-----------+--------+-----+--------+-------------------+--------+ PTA Mid    17                   dampened monophasic         +-----------+--------+-----+--------+-------------------+--------+ PTA Distal 24                   dampened monophasic         +-----------+--------+-----+--------+-------------------+--------+ PERO Prox  62                   monophasic                  +-----------+--------+-----+--------+-------------------+--------+ PERO Mid   31                   monophasic                  +-----------+--------+-----+--------+-------------------+--------+ PERO Distal21                   dampened monophasic         +-----------+--------+-----+--------+-------------------+--------+ DP         45                   monophasic                  +-----------+--------+-----+--------+-------------------+--------+  Summary: Right: Calcific plaque seen throughout extremity without evidence of significant stenosis. Monophasic waveforms at level of CFA indicative of inflow disease.  See table(s) above for measurements and observations.    Preliminary    VAS [place] ABI WITH/WO TBI Result Date: 03/04/2023  LOWER EXTREMITY DOPPLER STUDY Patient Name:  Joanna [name]  Date of Exam:   03/04/2023 Medical Rec #: [id number]        Accession #:    [id number] Date of Birth: 1960/04/11        Patient Gender: F Patient Age:   66 years Exam Location:  [facility] Procedure:      VAS [place] ABI WITH/WO TBI Referring Phys: Alan Mulder --------------------------------------------------------------------------------  Indications: Claudication, and rest pain. High Risk Factors: Hypertension.  Performing Technologist: Shona Simpson  Examination Guidelines: A complete evaluation includes at minimum, Doppler waveform signals and systolic blood pressure reading at the level of bilateral brachial, anterior tibial, and posterior tibial arteries, when vessel segments are accessible. Bilateral  testing is considered an integral part of a complete examination. Photoelectric Plethysmograph (PPG) waveforms and toe systolic pressure readings are  included as required and additional duplex testing as needed. Limited examinations for reoccurring indications may be performed as noted.  ABI Findings: +---------+------------------+-----+-------------------+--------+ Right    Rt Pressure (mmHg)IndexWaveform           Comment  +---------+------------------+-----+-------------------+--------+ Brachial 125                    biphasic                    +---------+------------------+-----+-------------------+--------+ PTA      54                0.42 dampened monophasic         +---------+------------------+-----+-------------------+--------+ DP       78                0.60 dampened monophasic         +---------+------------------+-----+-------------------+--------+ Great Toe0                 0.00 Absent                      +---------+------------------+-----+-------------------+--------+ +---------+------------------+-----+----------+-------+ Left     Lt Pressure (mmHg)IndexWaveform  Comment +---------+------------------+-----+----------+-------+ Brachial 130                    triphasic         +---------+------------------+-----+----------+-------+ PTA      144               1.11 monophasic        +---------+------------------+-----+----------+-------+ DP       139               1.07 monophasic        +---------+------------------+-----+----------+-------+ Great Toe103               0.79 Normal            +---------+------------------+-----+----------+-------+ +-------+-----------+-----------+------------+------------+ ABI/TBIToday's ABIToday's TBIPrevious ABIPrevious TBI +-------+-----------+-----------+------------+------------+ Right  0.6        0                                   +-------+-----------+-----------+------------+------------+  Left   1.11       0.79                                +-------+-----------+-----------+------------+------------+  Summary: Right: Resting right ankle-brachial index indicates moderate right lower extremity arterial disease. The right toe-brachial index is abnormal. Left: Resting left ankle-brachial index is within normal range. The left toe-brachial index is normal. Although ankle brachial indices are within normal limits (0.95-1.29), arterial Doppler waveforms at the ankle suggest some component of arterial occlusive disease. *See table(s) above for measurements and observations.  Electronically signed by Gerarda Fraction on 03/04/2023 at 5:19:00 PM.    Final    Scheduled Meds:  carvedilol  3.125 mg Oral BID WC   dofetilide  125 mcg Oral BID   methimazole  5 mg Oral Daily   Continuous Infusions:  cefTRIAXone (ROCEPHIN)  IV     heparin 1,100 Units/hr (03/06/23 1430)     LOS: 3 days    Time spent: 35 mins    Willeen Niece, MD [facility]   If 7PM-7AM, please contact night-coverage

## 2023-03-06 NOTE — Progress Notes (Signed)
PHARMACY - ANTICOAGULATION CONSULT NOTE  Pharmacy Consult for heparin Indication: atrial fibrillation  No Known Allergies  Patient Measurements: Height: 6' (182.9 cm) Weight: 64 kg (141 lb 1.5 oz) IBW/kg (Calculated) : 73.1 Heparin Dosing Weight: 64kg  Vital Signs: Temp: 98 F (36.7 C) (01/29 1555) Temp Source: Oral (01/29 1555) BP: 110/74 (01/29 2115) Pulse Rate: 83 (01/29 2115)  Labs: Recent Labs    03/04/23 0415 03/04/23 1750 03/05/23 0322 03/06/23 0418 03/06/23 1322 03/06/23 2047  HGB 10.1*  --  10.9* 9.8*  --   --   HCT 33.5*  --  35.6* 31.0*  --   --   PLT 318  --  311 282  --   --   APTT  --   --   --   --  45* 46*  HEPARINUNFRC  --   --   --  1.03*  --   --   CREATININE 1.04* 0.80 0.81 0.89  --   --   TROPONINIHS  --  14  --   --   --   --     Estimated Creatinine Clearance: 65.4 mL/min (by C-G formula based on SCr of 0.89 mg/dL).   Medical History: Past Medical History:  Diagnosis Date   Atrial fibrillation with RVR (HCC)    Chronic combined systolic (congestive) and diastolic (congestive) heart failure (HCC)    Dyspnea    Goiter    Hypertension    NICM (nonischemic cardiomyopathy) (HCC)    a. 08/2005 Echo: EF 30-35%, mod diff HK. Mild to Mod MR. Mildly dil LA; b. 08/2005 Cath: Nl Cors. Elevated CO w/o shunt; c. 09/2007 Echo: EF 45%. Mild to mod MR; c. 03/2015 Echo: EF 45%, global HK. Gr1 DD. Mild MR. Mildly dil LA.      Assessment: 63 yo W with afib on apixaban PTA, now held for angiogram 1/30. Last dose 1/28 at 9:30 am. Pharmacy consulted for heparin. Vascular service seeing and plans for angiogram on 1/29  -heparin level 1.03 (due to recent apixaban) -aPTT= 45  1/29 PM update: aPTT 46 seconds No signs of bleeding No issues with the heparin infusion  Goal of Therapy:  Heparin level 0.3-0.7 units/ml aPTT 66-102 seconds Monitor platelets by anticoagulation protocol: Yes   Plan:  Increase heparin to 1300 units/hr F/u aPTT until correlates  with heparin level  Monitor daily aPTT, heparin level, CBC, signs/symptoms of bleeding  F/u restart apixaban post procedure   Greta Doom BS, PharmD, BCPS Clinical Pharmacist 03/06/2023 9:30 PM  Contact: [phone] after 3 PM  "Be curious, not judgmental..." -Debbora Dus

## 2023-03-07 ENCOUNTER — Encounter (HOSPITAL_COMMUNITY)
Admission: EM | Disposition: A | Discharge status: Home / Self Care | Payer: Self-pay | Source: Home / Self Care | Attending: Family Medicine

## 2023-03-07 ENCOUNTER — Ambulatory Visit: Payer: 59 | Admitting: Podiatry

## 2023-03-07 DIAGNOSIS — I70221 Atherosclerosis of native arteries of extremities with rest pain, right leg: Secondary | ICD-10-CM

## 2023-03-07 DIAGNOSIS — I739 Peripheral vascular disease, unspecified: Secondary | ICD-10-CM | POA: Diagnosis not present

## 2023-03-07 HISTORY — PX: ABDOMINAL AORTOGRAM W/LOWER EXTREMITY: CATH118223

## 2023-03-07 LAB — CBC
HCT: 32.4 % — ABNORMAL LOW (ref 36.0–46.0)
Hemoglobin: 10 g/dL — ABNORMAL LOW (ref 12.0–15.0)
MCH: 27.7 pg (ref 26.0–34.0)
MCHC: 30.9 g/dL (ref 30.0–36.0)
MCV: 89.8 fL (ref 80.0–100.0)
Platelets: 286 10*3/uL (ref 150–400)
RBC: 3.61 MIL/uL — ABNORMAL LOW (ref 3.87–5.11)
RDW: 13.1 % (ref 11.5–15.5)
WBC: 12.4 10*3/uL — ABNORMAL HIGH (ref 4.0–10.5)
nRBC: 0 % (ref 0.0–0.2)

## 2023-03-07 LAB — BASIC METABOLIC PANEL
Anion gap: 10 (ref 5–15)
BUN: 15 mg/dL (ref 8–23)
CO2: 22 mmol/L (ref 22–32)
Calcium: 9 mg/dL (ref 8.9–10.3)
Chloride: 104 mmol/L (ref 98–111)
Creatinine, Ser: 0.79 mg/dL (ref 0.44–1.00)
GFR, Estimated: >60 mL/min (ref >60)
Glucose, Bld: 107 mg/dL — ABNORMAL HIGH (ref 70–99)
Potassium: 4 mmol/L (ref 3.5–5.1)
Sodium: 136 mmol/L (ref 135–145)

## 2023-03-07 LAB — APTT: aPTT: 42 s — ABNORMAL HIGH (ref 24–36)

## 2023-03-07 LAB — HEPARIN LEVEL (UNFRACTIONATED): Heparin Unfractionated: 0.55 [IU]/mL (ref 0.30–0.70)

## 2023-03-07 LAB — POCT ACTIVATED CLOTTING TIME
Activated Clotting Time: 205 s
Activated Clotting Time: 170 s

## 2023-03-07 SURGERY — ABDOMINAL AORTOGRAM W/LOWER EXTREMITY
Anesthesia: LOCAL

## 2023-03-07 MED ORDER — SODIUM CHLORIDE 0.9 % IV SOLN: INTRAVENOUS | Status: AC

## 2023-03-07 MED ORDER — HEPARIN (PORCINE) IN NACL 1000-0.9 UT/500ML-% IV SOLN
INTRAVENOUS | Status: DC | PRN
  Administered 2023-03-07 (×2): 500 mL

## 2023-03-07 MED ORDER — CLOPIDOGREL BISULFATE 75 MG PO TABS: 75.0000 mg | ORAL_TABLET | Freq: Every day | ORAL | Status: DC

## 2023-03-07 MED ORDER — ACETAMINOPHEN 325 MG PO TABS
650.0000 mg | ORAL_TABLET | ORAL | Status: DC | PRN
Start: 2023-03-07 — End: 2023-03-09

## 2023-03-07 MED ORDER — MIDAZOLAM HCL 2 MG/2ML IJ SOLN
INTRAMUSCULAR | Status: DC | PRN
  Administered 2023-03-07: 1 mg via INTRAVENOUS

## 2023-03-07 MED ORDER — LIDOCAINE HCL (PF) 1 % IJ SOLN
INTRAMUSCULAR | Status: AC
  Filled 2023-03-07: qty 30

## 2023-03-07 MED ORDER — CLOPIDOGREL BISULFATE 300 MG PO TABS
ORAL_TABLET | ORAL | Status: AC
  Filled 2023-03-07: qty 1

## 2023-03-07 MED ORDER — OXYCODONE HCL 5 MG PO TABS
ORAL_TABLET | ORAL | Status: AC
  Filled 2023-03-07: qty 1

## 2023-03-07 MED ORDER — FENTANYL CITRATE (PF) 100 MCG/2ML IJ SOLN
INTRAMUSCULAR | Status: DC | PRN
  Administered 2023-03-07: 25 ug via INTRAVENOUS

## 2023-03-07 MED ORDER — SODIUM CHLORIDE 0.9% FLUSH: 3.0000 mL | INTRAVENOUS | Status: DC | PRN

## 2023-03-07 MED ORDER — ONDANSETRON HCL 4 MG/2ML IJ SOLN: 4.0000 mg | Freq: Four times a day (QID) | INTRAMUSCULAR | Status: DC | PRN

## 2023-03-07 MED ORDER — HYDRALAZINE HCL 20 MG/ML IJ SOLN: 5.0000 mg | INTRAMUSCULAR | Status: DC | PRN

## 2023-03-07 MED ORDER — FENTANYL CITRATE (PF) 100 MCG/2ML IJ SOLN
INTRAMUSCULAR | Status: AC
  Filled 2023-03-07: qty 2

## 2023-03-07 MED ORDER — LIDOCAINE HCL (PF) 1 % IJ SOLN
INTRAMUSCULAR | Status: DC | PRN
  Administered 2023-03-07: 15 mL

## 2023-03-07 MED ORDER — HEPARIN SODIUM (PORCINE) 1000 UNIT/ML IJ SOLN
INTRAMUSCULAR | Status: AC
  Filled 2023-03-07: qty 10

## 2023-03-07 MED ORDER — SODIUM CHLORIDE 0.9 % IV SOLN: 250.0000 mL | INTRAVENOUS | Status: AC | PRN

## 2023-03-07 MED ORDER — IODIXANOL 320 MG/ML IV SOLN
INTRAVENOUS | Status: DC | PRN
  Administered 2023-03-07: 72 mL

## 2023-03-07 MED ORDER — HEPARIN SODIUM (PORCINE) 1000 UNIT/ML IJ SOLN
INTRAMUSCULAR | Status: DC | PRN
  Administered 2023-03-07: 6000 [IU] via INTRAVENOUS

## 2023-03-07 MED ORDER — ATORVASTATIN CALCIUM 40 MG PO TABS
40.0000 mg | ORAL_TABLET | Freq: Every day | ORAL | Status: DC
  Administered 2023-03-07 – 2023-03-09 (×3): 40 mg via ORAL
  Filled 2023-03-07 (×3): qty 1

## 2023-03-07 MED ORDER — CLOPIDOGREL BISULFATE 75 MG PO TABS
300.0000 mg | ORAL_TABLET | Freq: Once | ORAL | Status: DC
Start: 2023-03-07 — End: 2023-03-07

## 2023-03-07 MED ORDER — CLOPIDOGREL BISULFATE 300 MG PO TABS
ORAL_TABLET | ORAL | Status: DC | PRN
  Administered 2023-03-07: 300 mg via ORAL

## 2023-03-07 MED ORDER — CLOPIDOGREL BISULFATE 75 MG PO TABS
75.0000 mg | ORAL_TABLET | Freq: Every day | ORAL | Status: DC
  Administered 2023-03-08 – 2023-03-09 (×2): 75 mg via ORAL
  Filled 2023-03-07 (×2): qty 1

## 2023-03-07 MED ORDER — MIDAZOLAM HCL 2 MG/2ML IJ SOLN
INTRAMUSCULAR | Status: AC
  Filled 2023-03-07: qty 2

## 2023-03-07 MED ORDER — LABETALOL HCL 5 MG/ML IV SOLN: 10.0000 mg | INTRAVENOUS | Status: DC | PRN

## 2023-03-07 MED ORDER — SODIUM CHLORIDE 0.9% FLUSH
3.0000 mL | Freq: Two times a day (BID) | INTRAVENOUS | Status: DC
  Administered 2023-03-08 – 2023-03-09 (×3): 3 mL via INTRAVENOUS

## 2023-03-07 SURGICAL SUPPLY — 15 items
BALLN MUSTANG 6.0X40 135 (BALLOONS) ×1
BALLOON MUSTANG 6.0X40 135 (BALLOONS) IMPLANT
CATH OMNI FLUSH 5F 65CM (CATHETERS) IMPLANT
COVER DOME SNAP 22 D (MISCELLANEOUS) IMPLANT
GLIDEWIRE ADV .035X260CM (WIRE) IMPLANT
KIT ENCORE 26 ADVANTAGE (KITS) IMPLANT
KIT MICROPUNCTURE NIT STIFF (SHEATH) IMPLANT
SET ATX-X65L (MISCELLANEOUS) IMPLANT
SHEATH CATAPULT 6FR 45 (SHEATH) IMPLANT
SHEATH PINNACLE 5F 10CM (SHEATH) IMPLANT
SHEATH PINNACLE 6F 10CM (SHEATH) IMPLANT
SHEATH PROBE COVER 6X72 (BAG) IMPLANT
STENT ELUVIA 7X40X130 (Permanent Stent) IMPLANT
TRAY PV CATH (CUSTOM PROCEDURE TRAY) ×2 IMPLANT
WIRE BENTSON .035X145CM (WIRE) IMPLANT

## 2023-03-07 NOTE — Progress Notes (Addendum)
  Progress Note    03/07/2023 7:44 AM * No surgery found *  Subjective:  no complaints.  Denies fevers, chills, or feeling sick   Vitals:   03/07/23 0401 03/07/23 0727  BP:  119/75  Pulse:  95  Resp: 18 17  Temp:  (!) 101.7 F (38.7 C)  SpO2:  100%   Physical Exam: Lungs:  non labored Extremities:  R PT by doppler Neurologic: A&O  CBC    Component Value Date/Time   WBC 12.4 (H) 03/07/2023 0307   RBC 3.61 (L) 03/07/2023 0307   HGB 10.0 (L) 03/07/2023 0307   HGB 14.4 03/03/2021 0954   HCT 32.4 (L) 03/07/2023 0307   HCT 44.3 03/03/2021 0954   PLT 286 03/07/2023 0307   PLT 378 03/03/2021 0954   MCV 89.8 03/07/2023 0307   MCV 87 03/03/2021 0954   MCH 27.7 03/07/2023 0307   MCHC 30.9 03/07/2023 0307   RDW 13.1 03/07/2023 0307   RDW 11.3 (L) 03/03/2021 0954   LYMPHSABS 1.4 03/03/2023 1703   MONOABS 0.5 03/03/2023 1703   EOSABS 0.1 03/03/2023 1703   BASOSABS 0.0 03/03/2023 1703    BMET    Component Value Date/Time   NA 136 03/07/2023 0307   NA 144 04/17/2022 0841   K 4.0 03/07/2023 0307   CL 104 03/07/2023 0307   CO2 22 03/07/2023 0307   GLUCOSE 107 (H) 03/07/2023 0307   BUN 15 03/07/2023 0307   BUN 27 04/17/2022 0841   CREATININE 0.79 03/07/2023 0307   CALCIUM 9.0 03/07/2023 0307   GFRNONAA >60 03/07/2023 0307   GFRAA 81 11/16/2019 1156    INR    Component Value Date/Time   INR 1.5 (H) 03/03/2023 1830     Intake/Output Summary (Last 24 hours) at 03/07/2023 0744 Last data filed at 03/07/2023 0324 Gross per 24 hour  Intake 407.45 ml  Output --  Net 407.45 ml     Assessment/Plan:  63 y.o. female with CLI RLE with rest pain  R foot warm with PT despite rest pain Febrile overnight; denies subjective fevers, chills, sick feeling; continue tylenol prn; no sign of infection on exam Plan is for aortogram with R leg runoff and possible intervention today with Dr. Chestine Spore; RLE arterial duplex shows monophasic waveforms throughout the leg suggesting inflow  occlusive disease Continue npo Consent ordered Continue IV heparin   Emilie Rutter, PA-C [facility] [phone] 03/07/2023 7:44 AM  I have independently interviewed and examined patient and agree with PA assessment and plan above.  Eliquis has been held she is currently on heparin states that her foot is actually feeling okay today but was hurting yesterday continues with dusky appearance.  Plan for angiography from the left common femoral approach today with Dr. Chestine Spore.  Maylee Bare C. Randie Heinz, MD [facility] [place] Office: [phone] Pager: [phone]

## 2023-03-07 NOTE — Progress Notes (Signed)
PHARMACY - ANTICOAGULATION CONSULT NOTE  Pharmacy Consult for heparin Indication: atrial fibrillation  No Known Allergies  Patient Measurements: Height: 6' (182.9 cm) Weight: 64 kg (141 lb 1.5 oz) IBW/kg (Calculated) : 73.1 Heparin Dosing Weight: 64kg  Vital Signs: Temp: 99 F (37.2 C) (01/30 0100) Temp Source: Oral (01/30 0100) BP: 111/72 (01/30 0010) Pulse Rate: 90 (01/30 0100)  Labs: Recent Labs    03/04/23 1750 03/05/23 0322 03/06/23 0418 03/06/23 1322 03/06/23 2047 03/07/23 0307  HGB  --  10.9* 9.8*  --   --  10.0*  HCT  --  35.6* 31.0*  --   --  32.4*  PLT  --  311 282  --   --  286  APTT  --   --   --  45* 46* 42*  HEPARINUNFRC  --   --  1.03*  --   --  0.55  CREATININE 0.80 0.81 0.89  --   --  0.79  TROPONINIHS 14  --   --   --   --   --     Estimated Creatinine Clearance: 72.7 mL/min (by C-G formula based on SCr of 0.79 mg/dL).  Assessment: 63 yo W with afib on apixaban PTA, now held for angiogram 1/30. Last dose 1/28 at 9:30 am. Pharmacy consulted for heparin. Vascular service seeing and plans for angiogram on 1/29  -heparin level 1.03 >0.55 (due to recent apixaban) -aPTT= 42 seconds (drawn early) -No signs of bleeding -No issues with the heparin infusion  Goal of Therapy:  Heparin level 0.3-0.7 units/ml aPTT 66-102 seconds Monitor platelets by anticoagulation protocol: Yes   Plan:  Increase heparin to 1550 units/hr 8h aPTT F/u aPTT until correlates with heparin level  Monitor daily aPTT, heparin level, CBC, signs/symptoms of bleeding  F/u restart apixaban post procedure  Arabella Merles, PharmD. Clinical Pharmacist 03/07/2023 4:00 AM

## 2023-03-07 NOTE — Op Note (Signed)
    Patient name: Joanna [name] MRN: [id number] DOB: 18-Sep-1960 Sex: female  03/07/2023 Pre-operative Diagnosis: Critical limb ischemia of the right lower extremity with rest pain Post-operative diagnosis:  Same Surgeon:  Cephus Shelling, MD Procedure Performed: 1.  Ultrasound-guided access left common femoral artery 2.  Aortogram with catheter selection of aorta 3.  Right lower extremity arteriogram with catheter selection of the right common femoral artery 4.  Right external iliac artery angioplasty with stent placement (7 mm x 40 mm Eluvia postdilated with a 6 mm Mustang) 5.  36 minutes of monitored moderate conscious sedation time  Indications: 63 year old female seen with critical limb ischemia with rest pain in the right lower extremity.  She had no right femoral pulse.  She presents today for lower extremity arteriogram with a focus on the right leg after risks benefits discussed.  Findings:   Ultrasound-guided access left common femoral artery.  Aortogram showed patent infrarenal aorta.  On the right her common iliac was widely patent.  Distally she had a high-grade 90% external iliac artery stenosis.  Common femoral and profunda SFA were patent as well as the popliteal artery.  She has dominant runoff in the posterior tibial.  The peroneal occludes above the ankle as well as the anterior tibial.  The right external iliac artery high-grade stenosis with stented with a 7 mm Eluvia postdilated with a 6 mm Mustang with widely patent stent at completion and no residual stenosis.  Preserved runoff distally.   Procedure:  The patient was identified in the holding area and taken to room 8.  The patient was then placed supine on the table and prepped and draped in the usual sterile fashion.  A time out was called.  Patient received Versed and fentanyl for conscious moderate sedation.  Vital signs were monitored including heart rate, respiratory rate, oxygenation and blood pressure.   Ultrasound was used to evaluate the left common femoral artery.  It was patent .  A digital ultrasound image was acquired.  A micropuncture needle was used to access the left common femoral artery under ultrasound guidance.  An 018 wire was advanced without resistance and a micropuncture sheath was placed.  The 018 wire was removed and a benson wire was placed.  The micropuncture sheath was exchanged for a 5 french sheath.  An omniflush catheter was advanced over the wire to the level of L-1.  An abdominal angiogram was obtained.  Next, using the omniflush catheter and a benson wire, the aortic bifurcation was crossed and the catheter was placed into theright common femoral artery and right runoff was obtained.  Pertinent findings are noted above.  We ultimately elected for intervention.  I used a Glidewire advantage down the right SFA and upsized to a 6 [place] catapult sheath in the left groin over the aortic bifurcation.  Patient was given 100 uunits per kilogram IV heparin.  I then got a planning shot of the external iliac artery.  I stented this with a 7 mm x 40 mm Eluvia postdilated with a 6 mm Mustang.  Widely patent stent with preserved flow distally.  Wires and catheters were removed.  Given her small size we will plan manual sheath pull and a short 6 French sheath was placed in the left common femoral artery.  Plan: Excellent results.  Will start Plavix statin and she is on Eliquis.  Cephus Shelling, MD [facility] [place] Office: [phone]

## 2023-03-07 NOTE — Progress Notes (Signed)
Site area: left groin 16F Site Prior to Removal:  Level 0 Pressure Applied For: 30 minutes Manual:   yes Patient Status During Pull:  stable Post Pull Site:  Level 0 Post Pull Instructions Given:  yes Post Pull Pulses Present: left dp dopplered Dressing Applied:  gauze and tegaderm Bedrest begins @ 1410 Comments:

## 2023-03-07 NOTE — Progress Notes (Signed)
PROGRESS NOTE    Doha Boling  DGU:[id number] DOB: [birth year] DOA: 03/03/2023 PCP: Marcine Matar, MD   Brief Narrative:  This 63 yrs old female with PMH significant of A-fib, CHF, hypertension who presented to the ED with c/o: 1 month of fatigue, lightheadedness. Patient was recently treated for a dental infection. She completed the prescription however developed fever and chills. She was unable to get out of bed. She was also endorsing foot pain. EMS was called and she was found to be hypotensive with systolic in the 80s. She was brought to the ER where she was afebrile and hemodynamically stable.  Patient underwent x-ray of chest which showed chronic findings,  X-ray Right foot showed no acute fracture.  Patient was started on broad-spectrum antibiotics due to the lactic acidosis and concern for sepsis.  She was admitted for further evaluation.  Assessment & Plan:   Active Problems:   Sepsis (HCC)  Sepsis likely due to suspected pneumonia: Patient presented with tachycardia, tachypnea, lactic acidosis,  hypotension. Lactic acid 3.2, improved with IV hydration. Continue gentle volume resuscitation. Continue empiric antibiotics (vancomycin and cefepime). Blood and urine cultures no growth so far. CT chest showed mild patchy airspace opacity right lower lobe worrisome for pneumonia. Sepsis physiology improving.  Right foot pain: PAD Patient has palpable pulses however foot feels cold. Patient endorsing pain with movement and claudication with walking ABI index abnormal on the right side.  Vascular surgery consulted. Poor perfusion could be due to cardiogenic shock. Patient is scheduled for aortogram with right leg runoff and possible intervention today in cath lab with Dr. Chestine Spore.   COPD: No acute exacerbation. Continue home inhalers.   History of paroxysmal A-fib: Continue Tikosyn .  Eliquis kept on hold for angiogram.. Heart rate controlled. Continue Coreg. Continue IV  heparin in the meantime.  History of heart failure reduced ejection fraction: Patient taking Lasix at home.  Last echocardiogram was obtained on 02/07/23 which showed EF 40 to 45%.  Continue to monitor volume status in setting of resuscitation.  Hold spironolactone and Entresto due to hypotension.   Essential Hypertension:  Continue Coreg.   Hyperthyroidism: Continue methimazole  Hypomagnesemia: Replaced.  Continue to monitor  Acute kidney injury: > Resolved Likely due to sepsis. Creatinine 1.32, baseline normal. Avoid nephrotoxic medications. Renal functions normalized.  Splenic lesion/bilateral adrenal nodules: Follow-up outpatient.  DVT prophylaxis: Heparin IV Code Status: Full code Family Communication:No family at bed side. Disposition Plan:  Status is: Inpatient Remains inpatient appropriate because: Severity of illness   Consultants:  Vascular surgery  Procedures: None  Antimicrobials: Anti-infectives (From admission, onward)    Start     Dose/Rate Route Frequency Ordered Stop   03/06/23 2000  [MAR Hold]  cefTRIAXone (ROCEPHIN) 2 g in sodium chloride 0.9 % 100 mL IVPB        (MAR Hold since Thu 03/07/2023 at 1141.Hold Reason: Transfer to a Procedural area)   2 g 200 mL/hr over 30 Minutes Intravenous Every 24 hours 03/06/23 0919 03/10/23 1959   03/04/23 2200  vancomycin (VANCOCIN) IVPB 1000 mg/200 mL premix  Status:  Discontinued        1,000 mg 200 mL/hr over 60 Minutes Intravenous Every 24 hours 03/03/23 2144 03/06/23 0919   03/03/23 2130  vancomycin (VANCOREADY) IVPB 1250 mg/250 mL        1,250 mg 166.7 mL/hr over 90 Minutes Intravenous  Once 03/03/23 2124 03/04/23 0056   03/03/23 2130  ceFEPIme (MAXIPIME) 2 g in sodium chloride 0.9 %  100 mL IVPB  Status:  Discontinued        2 g 200 mL/hr over 30 Minutes Intravenous Every 12 hours 03/03/23 2124 03/06/23 0919       Subjective: Patient was seen and examined at bedside.  Overnight events noted. Patient was  sitting comfortably on the chair,  states she has pain in the right foot but reports improving. Patient is scheduled for aortogram with right leg runoff and possible intervention today in cath lab with Dr. Chestine Spore   Objective: Vitals:   03/07/23 0401 03/07/23 0727 03/07/23 1110 03/07/23 1157  BP:  119/75 98/69   Pulse:  95 69   Resp: 18 17 17    Temp:  (!) 101.7 F (38.7 C) 98 F (36.7 C)   TempSrc:  Oral Oral   SpO2:  100% 100% 97%  Weight:      Height:        Intake/Output Summary (Last 24 hours) at 03/07/2023 1213 Last data filed at 03/07/2023 0324 Gross per 24 hour  Intake 407.45 ml  Output --  Net 407.45 ml   Filed Weights   03/03/23 1659  Weight: 64 kg    Examination:  General exam: Appears comfortable, thin, deconditioned, not in any acute distress. Respiratory system: CTA bilaterally. Respiratory effort normal.  RR 16 Cardiovascular system: S1 & S2 heard, RRR. No JVD, murmurs, rubs, gallops or clicks.  Gastrointestinal system: Abdomen is non distended, soft and non tender. Normal bowel sounds heard. Central nervous system: Alert and oriented x 3. No focal neurological deficits. Extremities: No edema, no cyanosis, no clubbing Skin: No rashes, lesions or ulcers Psychiatry: Judgement and insight appear normal. Mood & affect appropriate.     Data Reviewed: I have personally reviewed following labs and imaging studies  CBC: Recent Labs  Lab 03/03/23 1703 03/04/23 0415 03/05/23 0322 03/06/23 0418 03/07/23 0307  WBC 9.3 6.4 10.0 12.3* 12.4*  NEUTROABS 7.2  --   --   --   --   HGB 11.8* 10.1* 10.9* 9.8* 10.0*  HCT 40.6 33.5* 35.6* 31.0* 32.4*  MCV 95.8 91.8 89.9 88.1 89.8  PLT 336 318 311 282 286   Basic Metabolic Panel: Recent Labs  Lab 03/04/23 0415 03/04/23 1750 03/05/23 0322 03/06/23 0418 03/07/23 0307  NA 135 139 137 135 136  K 3.6 4.4 4.3 3.7 4.0  CL 106 111 109 104 104  CO2 20* 19* 20* 21* 22  GLUCOSE 152* 97 108* 113* 107*  BUN 27* 19 15 13  15   CREATININE 1.04* 0.80 0.81 0.89 0.79  CALCIUM 8.7* 8.4* 9.2 8.9 9.0  MG 1.6* 2.2 1.9 1.7  --   PHOS  --   --  3.0  --   --    GFR: Estimated Creatinine Clearance: 72.7 mL/min (by C-G formula based on SCr of 0.79 mg/dL). Liver Function Tests: Recent Labs  Lab 03/03/23 1703  AST 12*  ALT 8  ALKPHOS 54  BILITOT 0.8  PROT 6.8  ALBUMIN 3.1*   No results for input(s): "LIPASE", "AMYLASE" in the last 168 hours. No results for input(s): "AMMONIA" in the last 168 hours. Coagulation Profile: Recent Labs  Lab 03/03/23 1830  INR 1.5*   Cardiac Enzymes: No results for input(s): "CKTOTAL", "CKMB", "CKMBINDEX", "TROPONINI" in the last 168 hours. BNP (last 3 results) No results for input(s): "PROBNP" in the last 8760 hours. HbA1C: No results for input(s): "HGBA1C" in the last 72 hours. CBG: No results for input(s): "GLUCAP" in the last 168  hours. Lipid Profile: No results for input(s): "CHOL", "HDL", "LDLCALC", "TRIG", "CHOLHDL", "LDLDIRECT" in the last 72 hours. Thyroid Function Tests: No results for input(s): "TSH", "T4TOTAL", "FREET4", "T3FREE", "THYROIDAB" in the last 72 hours. Anemia Panel: No results for input(s): "VITAMINB12", "FOLATE", "FERRITIN", "TIBC", "IRON", "RETICCTPCT" in the last 72 hours. Sepsis Labs: Recent Labs  Lab 03/03/23 1722 03/03/23 1938  LATICACIDVEN 3.2* 1.4    Recent Results (from the past 240 hours)  Resp panel by RT-PCR (RSV, Flu A&B, Covid) Anterior Nasal Swab     Status: None   Collection Time: 03/03/23  6:13 PM   Specimen: Anterior Nasal Swab  Result Value Ref Range Status   SARS Coronavirus 2 by RT PCR NEGATIVE NEGATIVE Final   Influenza A by PCR NEGATIVE NEGATIVE Final   Influenza B by PCR NEGATIVE NEGATIVE Final    Comment: (NOTE) The Xpert Xpress SARS-CoV-2/FLU/RSV plus assay is intended as an aid in the diagnosis of influenza from Nasopharyngeal swab specimens and should not be used as a sole basis for treatment. Nasal washings  and aspirates are unacceptable for Xpert Xpress SARS-CoV-2/FLU/RSV testing.  Fact Sheet for Patients: BloggerCourse.com  Fact Sheet for Healthcare Providers: SeriousBroker.it  This test is not yet approved or cleared by the [place] FDA and has been authorized for detection and/or diagnosis of SARS-CoV-2 by FDA under an Emergency Use Authorization (EUA). This EUA will remain in effect (meaning this test can be used) for the duration of the COVID-19 declaration under Section 564(b)(1) of the Act, 21 U.S.C. section 360bbb-3(b)(1), unless the authorization is terminated or revoked.     Resp Syncytial Virus by PCR NEGATIVE NEGATIVE Final    Comment: (NOTE) Fact Sheet for Patients: BloggerCourse.com  Fact Sheet for Healthcare Providers: SeriousBroker.it  This test is not yet approved or cleared by the [place] FDA and has been authorized for detection and/or diagnosis of SARS-CoV-2 by FDA under an Emergency Use Authorization (EUA). This EUA will remain in effect (meaning this test can be used) for the duration of the COVID-19 declaration under Section 564(b)(1) of the Act, 21 U.S.C. section 360bbb-3(b)(1), unless the authorization is terminated or revoked.  Performed at [facility], [address], [place], [place] [address]   Blood Culture (routine x 2)     Status: None (Preliminary result)   Collection Time: 03/03/23  6:30 PM   Specimen: BLOOD RIGHT HAND  Result Value Ref Range Status   Specimen Description BLOOD RIGHT HAND  Final   Special Requests   Final    BOTTLES DRAWN AEROBIC AND ANAEROBIC Blood Culture results may not be optimal due to an inadequate volume of blood received in culture bottles   Culture   Final    NO GROWTH 4 DAYS Performed at [facility], [address], [place], [place] [address]    Report Status PENDING  Incomplete  Urine  Culture     Status: None   Collection Time: 03/04/23 12:23 PM   Specimen: Urine, Random  Result Value Ref Range Status   Specimen Description URINE, RANDOM  Final   Special Requests NONE Reflexed from [id number]  Final   Culture   Final    NO GROWTH Performed at [facility], [address] [place], [place] [address]    Report Status 03/05/2023 FINAL  Final  MRSA Next Gen by PCR, Nasal     Status: None   Collection Time: 03/05/23 11:01 AM   Specimen: Nasal Mucosa; Nasal Swab  Result Value Ref Range Status   MRSA by PCR Next Gen NOT DETECTED NOT DETECTED Final    Comment: (NOTE) The GeneXpert MRSA Assay (FDA approved for NASAL specimens only), is one component of a comprehensive MRSA colonization surveillance program. It is not intended to diagnose MRSA infection nor to guide or monitor treatment for MRSA infections. Test performance is not FDA approved in patients less than 13 years old. Performed at [facility], [address], [place], [place] [address]    Radiology Studies: VAS [place] LOWER EXTREMITY ARTERIAL DUPLEX Result Date: 03/06/2023 LOWER EXTREMITY ARTERIAL DUPLEX STUDY Patient Name:  EMMALINA ESPERICUETA  Date of Exam:   03/06/2023 Medical Rec #: [id number]        Accession #:    [id number] Date of Birth: [birth year]        Patient Gender: F Patient Age:   49 years Exam Location:  [facility] Procedure:      VAS [place] LOWER EXTREMITY ARTERIAL DUPLEX Referring Phys: Lemar Livings --------------------------------------------------------------------------------  Indications: Peripheral artery disease. High Risk Factors: Hypertension, current smoker. Other Factors: CHF, Afib.  Current ABI: RT 0.60 LT 1.11 Comparison Study: No previous exams Performing Technologist: Jody Hill RVT, RDMS  Examination Guidelines: A complete evaluation includes B-mode imaging, spectral Doppler, color Doppler, and power Doppler as needed of all accessible portions of each vessel. Bilateral testing is  considered an integral part of a complete examination. Limited examinations for reoccurring indications may be performed as noted.  +-----------+--------+-----+--------+-------------------+--------+ RIGHT      PSV cm/sRatioStenosisWaveform           Comments +-----------+--------+-----+--------+-------------------+--------+ CFA Mid    57                   monophasic                  +-----------+--------+-----+--------+-------------------+--------+ CFA Distal 54                   monophasic                  +-----------+--------+-----+--------+-------------------+--------+ DFA        40                   monophasic                  +-----------+--------+-----+--------+-------------------+--------+ SFA Prox   62                   monophasic                  +-----------+--------+-----+--------+-------------------+--------+ SFA Mid    58                   monophasic                  +-----------+--------+-----+--------+-------------------+--------+ SFA Distal 54                   monophasic                  +-----------+--------+-----+--------+-------------------+--------+ POP Prox   47                   monophasic                  +-----------+--------+-----+--------+-------------------+--------+ POP Distal 35                   monophasic                  +-----------+--------+-----+--------+-------------------+--------+  TP Trunk   35                   monophasic                  +-----------+--------+-----+--------+-------------------+--------+ ATA Prox   40                   monophasic                  +-----------+--------+-----+--------+-------------------+--------+ ATA Mid    22                   monophasic                  +-----------+--------+-----+--------+-------------------+--------+ ATA Distal 14                   monophasic                  +-----------+--------+-----+--------+-------------------+--------+ PTA  Prox   23                   dampened monophasic         +-----------+--------+-----+--------+-------------------+--------+ PTA Mid    17                   dampened monophasic         +-----------+--------+-----+--------+-------------------+--------+ PTA Distal 24                   dampened monophasic         +-----------+--------+-----+--------+-------------------+--------+ PERO Prox  62                   monophasic                  +-----------+--------+-----+--------+-------------------+--------+ PERO Mid   31                   monophasic                  +-----------+--------+-----+--------+-------------------+--------+ PERO Distal21                   dampened monophasic         +-----------+--------+-----+--------+-------------------+--------+ DP         45                   monophasic                  +-----------+--------+-----+--------+-------------------+--------+  Summary: Right: Calcific plaque seen throughout extremity without evidence of significant stenosis. Monophasic waveforms at level of CFA indicative of inflow disease.  See table(s) above for measurements and observations. Electronically signed by Heath Lark on 03/06/2023 at 4:14:18 PM.    Final    Scheduled Meds:  [MAR Hold] carvedilol  3.125 mg Oral BID WC   [MAR Hold] dofetilide  125 mcg Oral BID   [MAR Hold] methimazole  5 mg Oral Daily   Continuous Infusions:  [MAR Hold] cefTRIAXone (ROCEPHIN)  IV 2 g (03/06/23 2159)   heparin 1,550 Units/hr (03/07/23 0413)     LOS: 4 days    Time spent: 35 mins    Willeen Niece, MD [facility]   If 7PM-7AM, please contact night-coverage

## 2023-03-07 NOTE — Progress Notes (Signed)
   03/07/23 1442  Vitals  Temp 98.2 F (36.8 C)  Temp Source Oral  BP 101/78  MAP (mmHg) 86  BP Location Right Arm  BP Method Automatic  Patient Position (if appropriate) Lying  Pulse Rate 88  ECG Heart Rate 80  Resp 16  Level of Consciousness  Level of Consciousness Alert  MEWS COLOR  MEWS Score Color Green  Oxygen Therapy  SpO2 100 %  O2 Device Room Air  MEWS Score  MEWS Temp 0  MEWS Systolic 0  MEWS Pulse 0  MEWS RR 0  MEWS LOC 0  MEWS Score 0   Patient arrived back from cath lab to 4E10, pt with left groin level 0 at this time, no oozing or hematoma noted, pt with doppler pulses in the left foot. Bedside RN/charge RN aware. Kendell Gammon, Randall An RN

## 2023-03-07 NOTE — Plan of Care (Signed)
Problem: Clinical Measurements: Goal: Respiratory complications will improve Outcome: Progressing

## 2023-03-08 ENCOUNTER — Other Ambulatory Visit (HOSPITAL_COMMUNITY): Payer: Self-pay | Admitting: Cardiology

## 2023-03-08 ENCOUNTER — Encounter (HOSPITAL_COMMUNITY): Payer: Self-pay | Admitting: Vascular Surgery

## 2023-03-08 DIAGNOSIS — I739 Peripheral vascular disease, unspecified: Secondary | ICD-10-CM | POA: Diagnosis not present

## 2023-03-08 LAB — MAGNESIUM: Magnesium: 1.9 mg/dL (ref 1.7–2.4)

## 2023-03-08 LAB — CBC
HCT: 30.6 % — ABNORMAL LOW (ref 36.0–46.0)
Hemoglobin: 9.7 g/dL — ABNORMAL LOW (ref 12.0–15.0)
MCH: 28.1 pg (ref 26.0–34.0)
MCHC: 31.7 g/dL (ref 30.0–36.0)
MCV: 88.7 fL (ref 80.0–100.0)
Platelets: 319 10*3/uL (ref 150–400)
RBC: 3.45 MIL/uL — ABNORMAL LOW (ref 3.87–5.11)
RDW: 13.1 % (ref 11.5–15.5)
WBC: 10.1 10*3/uL (ref 4.0–10.5)
nRBC: 0 % (ref 0.0–0.2)

## 2023-03-08 LAB — CULTURE, BLOOD (ROUTINE X 2): Culture: NO GROWTH

## 2023-03-08 LAB — BASIC METABOLIC PANEL
Anion gap: 9 (ref 5–15)
BUN: 14 mg/dL (ref 8–23)
CO2: 23 mmol/L (ref 22–32)
Calcium: 8.9 mg/dL (ref 8.9–10.3)
Chloride: 105 mmol/L (ref 98–111)
Creatinine, Ser: 0.89 mg/dL (ref 0.44–1.00)
GFR, Estimated: >60 mL/min (ref >60)
Glucose, Bld: 121 mg/dL — ABNORMAL HIGH (ref 70–99)
Potassium: 4.1 mmol/L (ref 3.5–5.1)
Sodium: 137 mmol/L (ref 135–145)

## 2023-03-08 LAB — LIPID PANEL
Cholesterol: 101 mg/dL (ref 0–200)
HDL: 27 mg/dL — ABNORMAL LOW (ref >40)
LDL Cholesterol: 56 mg/dL (ref 0–99)
Total CHOL/HDL Ratio: 3.7 {ratio}
Triglycerides: 88 mg/dL (ref <150)
VLDL: 18 mg/dL (ref 0–40)

## 2023-03-08 MED ORDER — APIXABAN 5 MG PO TABS
5.0000 mg | ORAL_TABLET | Freq: Two times a day (BID) | ORAL | Status: DC
  Administered 2023-03-08 – 2023-03-09 (×2): 5 mg via ORAL
  Filled 2023-03-08 (×2): qty 1

## 2023-03-08 NOTE — Progress Notes (Addendum)
Progress Note    03/08/2023 7:35 AM 1 Day Post-Op  Subjective:  says her right foot feels a lot better now. Still endorses some soreness and itching to the top of the foot    Vitals:   03/08/23 0024 03/08/23 0500  BP: 100/68   Pulse: 77   Resp: 19   Temp: 98.7 F (37.1 C) 98 F (36.7 C)  SpO2: 100% 100%    Physical Exam: General:  laying in bed comfortably, NAD Lungs:  nonlabored Incisions:  L groin cath site intact without hematoma Extremities:  palpable right femoral pulse. Greatly improved right PT doppler signal   CBC    Component Value Date/Time   WBC 12.4 (H) 03/07/2023 0307   RBC 3.61 (L) 03/07/2023 0307   HGB 10.0 (L) 03/07/2023 0307   HGB 14.4 03/03/2021 0954   HCT 32.4 (L) 03/07/2023 0307   HCT 44.3 03/03/2021 0954   PLT 286 03/07/2023 0307   PLT 378 03/03/2021 0954   MCV 89.8 03/07/2023 0307   MCV 87 03/03/2021 0954   MCH 27.7 03/07/2023 0307   MCHC 30.9 03/07/2023 0307   RDW 13.1 03/07/2023 0307   RDW 11.3 (L) 03/03/2021 0954   LYMPHSABS 1.4 03/03/2023 1703   MONOABS 0.5 03/03/2023 1703   EOSABS 0.1 03/03/2023 1703   BASOSABS 0.0 03/03/2023 1703    BMET    Component Value Date/Time   NA 136 03/07/2023 0307   NA 144 04/17/2022 0841   K 4.0 03/07/2023 0307   CL 104 03/07/2023 0307   CO2 22 03/07/2023 0307   GLUCOSE 107 (H) 03/07/2023 0307   BUN 15 03/07/2023 0307   BUN 27 04/17/2022 0841   CREATININE 0.79 03/07/2023 0307   CALCIUM 9.0 03/07/2023 0307   GFRNONAA >60 03/07/2023 0307   GFRAA 81 11/16/2019 1156    INR    Component Value Date/Time   INR 1.5 (H) 03/03/2023 1830     Intake/Output Summary (Last 24 hours) at 03/08/2023 0735 Last data filed at 03/08/2023 0238 Gross per 24 hour  Intake 100 ml  Output --  Net 100 ml      Assessment/Plan:  63 y.o. female is 1 day post op, s/p: RLE angiogram with right EIA stenting   -The patient underwent RLE angiogram yesterday with right external iliac artery angioplasty and  stenting for 90% stenosis. Downstream her CFA, SFA, and popliteal arteries were patent. Dominant runoff is the PT -She says her right foot pain is significantly better now. She having a little bit of remaining tenderness and itching to the top of her right foot. I anticipate that this will get better with time after revascularization -L groin cath site is intact without bruising or hematoma -RLE well perfused with palpable right femoral pulse and brisk PT doppler signal -CBC pending this AM. If her HGB is stable, she is clear to resume her Eliquis from a vascular standpoint. We have also started her on Plavix for stent patency -She can follow up with our office in 4-6 wks with aortoiliac duplex and ABIs. Stable for discharge from a vascular perspective   Loel Dubonnet PA-C [facility] [phone] 03/08/2023 7:35 AM   I have seen and evaluated the patient. I agree with the PA note as documented above.  Post procedure day 1 status post right external iliac artery stenting for high-grade stenosis with rest pain.  States her foot feels better.  Right femoral pulse now palpable.  Brisk PT signal at the ankle.  Would recommend Plavix from our standpoint and statin and already on Eliquis.  Will arrange follow-up in 1 month with right leg iliac duplex and ABIs.  Cephus Shelling, MD [facility] [place] Office: [phone]

## 2023-03-08 NOTE — Plan of Care (Signed)

## 2023-03-08 NOTE — Progress Notes (Signed)
Notified by CCMD that pt has 7 beat run non-sustained Vtach. Pt asymptomatic

## 2023-03-08 NOTE — Progress Notes (Signed)
    I was called to patient bedside for concern of right foot pain with decreased signals.  She has very strong posterior tibial signal anterior tibial signal hide on the ankle consistent with recent angiography and she also has a palpable right common femoral pulse.  Pain appears to be reperfusion and may take days to weeks to recover and she may have continued weakness of the foot given prolonged ischemia time.  She is now medically managed on Eliquis, Plavix and statin and has follow-up in our office in 4 to 6 weeks time for further evaluation.  Deloyd Handy C. Randie Heinz, MD [facility] [place] Office: [phone] Pager: [phone]

## 2023-03-08 NOTE — Plan of Care (Signed)
 ?  Problem: Clinical Measurements: ?Goal: Will remain free from infection ?Outcome: Progressing ?  ?

## 2023-03-08 NOTE — Progress Notes (Signed)
Mobility Specialist Progress Note:    03/08/23 1154  Mobility  Activity Ambulated with assistance in hallway  Level of Assistance Contact guard assist, steadying assist  Assistive Device Front wheel walker  Distance Ambulated (ft) 120 ft  Activity Response Tolerated well  Mobility Referral Yes  Mobility visit 1 Mobility  Mobility Specialist Start Time (ACUTE ONLY) 1145  Mobility Specialist Stop Time (ACUTE ONLY) 1154  Mobility Specialist Time Calculation (min) (ACUTE ONLY) 9 min   Pt received in bed, agreeable to mobility session. Ambulated in hallway with CGA and RW. Tolerated well, asx throughout. Max HR 108 bpm, SpO2 100% on RA. Returned pt back to room, agreeable to sit up in chair. Left with all needs met, call bell and phone in reach. BP post Mobility 90/68 (76). Eager for d/c.   Feliciana Rossetti Mobility Specialist Please contact via SecureChat or  Rehab office at [phone]

## 2023-03-08 NOTE — Progress Notes (Signed)
Patient stated that she will need a walker at d/c.   thanks

## 2023-03-08 NOTE — Progress Notes (Signed)
   03/08/23 1541  TOC Brief Assessment  Insurance and Status Reviewed  Patient has primary care physician Yes  Home environment has been reviewed home  Prior level of function: self  Prior/Current Home Services No current home services  Social Drivers of Health Review SDOH reviewed needs interventions  Readmission risk has been reviewed Yes  Transition of care needs transition of care needs identified, TOC will continue to follow     We will continue to monitor patient advancement through interdisciplinary progression rounds. If new patient transition needs arise, please place a TOC consult.

## 2023-03-08 NOTE — Progress Notes (Signed)
PROGRESS NOTE    Joanna [name]  ZOX:[id number] DOB: December 20, 1960 DOA: 03/03/2023 PCP: Marcine Matar, MD   Brief Narrative:  This 63 yrs old female with PMH significant of A-fib, CHF, hypertension who presented to the ED with c/o: 1 month of fatigue, lightheadedness. Patient was recently treated for a dental infection. She completed the prescription however developed fever and chills. She was unable to get out of bed. She was also endorsing foot pain. EMS was called and she was found to be hypotensive with systolic in the 80s. She was brought to the ER where she was afebrile and hemodynamically stable.  Patient underwent x-ray of chest which showed chronic findings,  X-ray Right foot showed no acute fracture.  Patient was started on broad-spectrum antibiotics due to the lactic acidosis and concern for sepsis.  She was admitted for further evaluation.  Assessment & Plan:   Active Problems:   Sepsis (HCC)  Sepsis likely due to suspected pneumonia: Patient presented with tachycardia, tachypnea, lactic acidosis,  hypotension. Lactic acid 3.2, improved with IV hydration. Continue gentle volume resuscitation. Initiated on empiric antibiotics (vancomycin and cefepime) deescalated to ceftriaxone. Blood and urine cultures no growth so far. CT chest showed mild patchy airspace opacity right lower lobe worrisome for pneumonia. Sepsis physiology improving.  Right foot pain: PAD ( High grade stenosis) Patient has palpable pulses however foot feels cold. Patient endorsing pain with movement and claudication with walking ABI index abnormal on the right side.  Vascular surgery consulted. Poor perfusion could be due to cardiogenic shock. Patient underwent aortogram with right leg runoff , status post right external iliac artery stenting for high-grade stenosis. Vascular surgery recommended to continue Plavix,  statin and Eliquis. Advised to follow-up in 1 month with right leg iliac duplex and  ABI.  COPD: No acute exacerbation. Continue home inhalers.   History of paroxysmal A-fib: Continue Tikosyn . Eliquis kept on hold for angiogram.. Heart rate controlled. Continue Coreg. Continue IV heparin in the meantime. Resume Eliquis since procedure is done.  History of heart failure reduced ejection fraction: Patient taking Lasix at home.  Last echocardiogram was obtained on 02/07/23 which showed EF 40 to 45%.  Continue to monitor volume status in setting of resuscitation.  Hold spironolactone and Entresto due to hypotension.   Essential Hypertension:  Continue Coreg.   Hyperthyroidism: Continue methimazole  Hypomagnesemia: Replaced.  Continue to monitor  Acute kidney injury: > Resolved Likely due to sepsis. Creatinine 1.32, baseline normal. Avoid nephrotoxic medications. Renal functions normalized.  Splenic lesion/bilateral adrenal nodules: Follow-up outpatient.  DVT prophylaxis: Heparin IV Code Status: Full code Family Communication:No family at bed side. Disposition Plan:  Status is: Inpatient Remains inpatient appropriate because: Severity of illness   Consultants:  Vascular surgery  Procedures: None  Antimicrobials: Anti-infectives (From admission, onward)    Start     Dose/Rate Route Frequency Ordered Stop   03/06/23 2000  cefTRIAXone (ROCEPHIN) 2 g in sodium chloride 0.9 % 100 mL IVPB        2 g 200 mL/hr over 30 Minutes Intravenous Every 24 hours 03/06/23 0919 03/10/23 1959   03/04/23 2200  vancomycin (VANCOCIN) IVPB 1000 mg/200 mL premix  Status:  Discontinued        1,000 mg 200 mL/hr over 60 Minutes Intravenous Every 24 hours 03/03/23 2144 03/06/23 0919   03/03/23 2130  vancomycin (VANCOREADY) IVPB 1250 mg/250 mL        1,250 mg 166.7 mL/hr over 90 Minutes Intravenous  Once 03/03/23 2124  03/04/23 0056   03/03/23 2130  ceFEPIme (MAXIPIME) 2 g in sodium chloride 0.9 % 100 mL IVPB  Status:  Discontinued        2 g 200 mL/hr over 30 Minutes  Intravenous Every 12 hours 03/03/23 2124 03/06/23 0919       Subjective: Patient was seen and examined at bedside. Overnight events noted. Patient is status post vascular intervention for high-grade stenosis RLE.  Tolerated well. Patient reports feeling better,  pain is there but improved.  Objective: Vitals:   03/08/23 0024 03/08/23 0500 03/08/23 0800 03/08/23 0823  BP: 100/68  94/72 94/72  Pulse: 77  93 88  Resp: 19  19 16   Temp: 98.7 F (37.1 C) 98 F (36.7 C)  99.2 F (37.3 C)  TempSrc: Oral Oral  Oral  SpO2: 100% 100% 100% 100%  Weight:      Height:        Intake/Output Summary (Last 24 hours) at 03/08/2023 1153 Last data filed at 03/08/2023 0238 Gross per 24 hour  Intake 100 ml  Output --  Net 100 ml   Filed Weights   03/03/23 1659  Weight: 64 kg    Examination:  General exam: Appears comfortable, thin, deconditioned, not in any acute distress. Respiratory system: CTA bilaterally. Respiratory effort normal.  RR 16 Cardiovascular system: S1 & S2 heard, RRR. No JVD, murmurs, rubs, gallops or clicks.  Gastrointestinal system: Abdomen is non distended, soft and non tender. Normal bowel sounds heard. Central nervous system: Alert and oriented x 3. No focal neurological deficits. Extremities: No edema, no cyanosis, no clubbing Skin: No rashes, lesions or ulcers Psychiatry: Judgement and insight appear normal. Mood & affect appropriate.     Data Reviewed: I have personally reviewed following labs and imaging studies  CBC: Recent Labs  Lab 03/03/23 1703 03/04/23 0415 03/05/23 0322 03/06/23 0418 03/07/23 0307  WBC 9.3 6.4 10.0 12.3* 12.4*  NEUTROABS 7.2  --   --   --   --   HGB 11.8* 10.1* 10.9* 9.8* 10.0*  HCT 40.6 33.5* 35.6* 31.0* 32.4*  MCV 95.8 91.8 89.9 88.1 89.8  PLT 336 318 311 282 286   Basic Metabolic Panel: Recent Labs  Lab 03/04/23 0415 03/04/23 1750 03/05/23 0322 03/06/23 0418 03/07/23 0307  NA 135 139 137 135 136  K 3.6 4.4 4.3 3.7  4.0  CL 106 111 109 104 104  CO2 20* 19* 20* 21* 22  GLUCOSE 152* 97 108* 113* 107*  BUN 27* 19 15 13 15   CREATININE 1.04* 0.80 0.81 0.89 0.79  CALCIUM 8.7* 8.4* 9.2 8.9 9.0  MG 1.6* 2.2 1.9 1.7  --   PHOS  --   --  3.0  --   --    GFR: Estimated Creatinine Clearance: 72.7 mL/min (by C-G formula based on SCr of 0.79 mg/dL). Liver Function Tests: Recent Labs  Lab 03/03/23 1703  AST 12*  ALT 8  ALKPHOS 54  BILITOT 0.8  PROT 6.8  ALBUMIN 3.1*   No results for input(s): "LIPASE", "AMYLASE" in the last 168 hours. No results for input(s): "AMMONIA" in the last 168 hours. Coagulation Profile: Recent Labs  Lab 03/03/23 1830  INR 1.5*   Cardiac Enzymes: No results for input(s): "CKTOTAL", "CKMB", "CKMBINDEX", "TROPONINI" in the last 168 hours. BNP (last 3 results) No results for input(s): "PROBNP" in the last 8760 hours. HbA1C: No results for input(s): "HGBA1C" in the last 72 hours. CBG: No results for input(s): "GLUCAP" in the last  168 hours. Lipid Profile: No results for input(s): "CHOL", "HDL", "LDLCALC", "TRIG", "CHOLHDL", "LDLDIRECT" in the last 72 hours. Thyroid Function Tests: No results for input(s): "TSH", "T4TOTAL", "FREET4", "T3FREE", "THYROIDAB" in the last 72 hours. Anemia Panel: No results for input(s): "VITAMINB12", "FOLATE", "FERRITIN", "TIBC", "IRON", "RETICCTPCT" in the last 72 hours. Sepsis Labs: Recent Labs  Lab 03/03/23 1722 03/03/23 1938  LATICACIDVEN 3.2* 1.4    Recent Results (from the past 240 hours)  Resp panel by RT-PCR (RSV, Flu A&B, Covid) Anterior Nasal Swab     Status: None   Collection Time: 03/03/23  6:13 PM   Specimen: Anterior Nasal Swab  Result Value Ref Range Status   SARS Coronavirus 2 by RT PCR NEGATIVE NEGATIVE Final   Influenza A by PCR NEGATIVE NEGATIVE Final   Influenza B by PCR NEGATIVE NEGATIVE Final    Comment: (NOTE) The Xpert Xpress SARS-CoV-2/FLU/RSV plus assay is intended as an aid in the diagnosis of influenza  from Nasopharyngeal swab specimens and should not be used as a sole basis for treatment. Nasal washings and aspirates are unacceptable for Xpert Xpress SARS-CoV-2/FLU/RSV testing.  Fact Sheet for Patients: BloggerCourse.com  Fact Sheet for Healthcare Providers: SeriousBroker.it  This test is not yet approved or cleared by the [place] FDA and has been authorized for detection and/or diagnosis of SARS-CoV-2 by FDA under an Emergency Use Authorization (EUA). This EUA will remain in effect (meaning this test can be used) for the duration of the COVID-19 declaration under Section 564(b)(1) of the Act, 21 U.S.C. section 360bbb-3(b)(1), unless the authorization is terminated or revoked.     Resp Syncytial Virus by PCR NEGATIVE NEGATIVE Final    Comment: (NOTE) Fact Sheet for Patients: BloggerCourse.com  Fact Sheet for Healthcare Providers: SeriousBroker.it  This test is not yet approved or cleared by the [place] FDA and has been authorized for detection and/or diagnosis of SARS-CoV-2 by FDA under an Emergency Use Authorization (EUA). This EUA will remain in effect (meaning this test can be used) for the duration of the COVID-19 declaration under Section 564(b)(1) of the Act, 21 U.S.C. section 360bbb-3(b)(1), unless the authorization is terminated or revoked.  Performed at [facility], [address] [place], [place] [address]   Blood Culture (routine x 2)     Status: None   Collection Time: 03/03/23  6:30 PM   Specimen: BLOOD RIGHT HAND  Result Value Ref Range Status   Specimen Description BLOOD RIGHT HAND  Final   Special Requests   Final    BOTTLES DRAWN AEROBIC AND ANAEROBIC Blood Culture results may not be optimal due to an inadequate volume of blood received in culture bottles   Culture   Final    NO GROWTH 5 DAYS Performed at [facility],  [address] [place], [place] [address]    Report Status 03/08/2023 FINAL  Final  Urine Culture     Status: None   Collection Time: 03/04/23 12:23 PM   Specimen: Urine, Random  Result Value Ref Range Status   Specimen Description URINE, RANDOM  Final   Special Requests NONE Reflexed from [id number]  Final   Culture   Final    NO GROWTH Performed at [facility], [address], [place], [place] [address]    Report Status 03/05/2023 FINAL  Final  MRSA Next Gen by PCR, Nasal     Status: None   Collection Time: 03/05/23 11:01 AM   Specimen: Nasal Mucosa; Nasal Swab  Result Value Ref Range Status   MRSA by PCR Next Gen NOT DETECTED NOT DETECTED Final    Comment: (NOTE) The GeneXpert MRSA Assay (FDA approved for NASAL specimens only), is one component of a comprehensive MRSA colonization surveillance program. It is not intended to diagnose MRSA infection nor to guide or monitor treatment for MRSA infections. Test performance is not FDA approved in patients less than 61 years old. Performed at [facility], [address], [place], [place] [address]    Radiology Studies: PERIPHERAL VASCULAR CATHETERIZATION Result Date: 03/07/2023 Images from the original result were not included.   Patient name: Joanna [name]         MRN: [id number]        DOB: 1960/07/11          Sex: female  03/07/2023 Pre-operative Diagnosis: Critical limb ischemia of the right lower extremity with rest pain Post-operative diagnosis:  Same Surgeon:  Cephus Shelling, MD Procedure Performed: 1.  Ultrasound-guided access left common femoral artery 2.  Aortogram with catheter selection of aorta 3.  Right lower extremity arteriogram with catheter selection of the right common femoral artery 4.  Right external iliac artery angioplasty with stent placement (7 mm x 40 mm Eluvia postdilated with a 6 mm Mustang) 5.  36 minutes of monitored moderate conscious sedation time  Indications: 63 year old female seen with  critical limb ischemia with rest pain in the right lower extremity.  She had no right femoral pulse.  She presents today for lower extremity arteriogram with a focus on the right leg after risks benefits discussed.  Findings:  Ultrasound-guided access left common femoral artery.  Aortogram showed patent infrarenal aorta.  On the right her common iliac was widely patent.  Distally she had a high-grade 90% external iliac artery stenosis.  Common femoral and profunda SFA were patent as well as the popliteal artery.  She has dominant runoff in the posterior tibial.  The peroneal occludes above the ankle as well as the anterior tibial.  The right external iliac artery high-grade stenosis with stented with a 7 mm Eluvia postdilated with a 6 mm Mustang with widely patent stent at completion and no residual stenosis.  Preserved runoff distally.             Procedure:  The patient was identified in the holding area and taken to room 8.  The patient was then placed supine on the table and prepped and draped in the usual sterile fashion.  A time out was called.  Patient received Versed and fentanyl for conscious moderate sedation.  Vital signs were monitored including heart rate, respiratory rate, oxygenation and blood pressure.  Ultrasound was used to evaluate the left common femoral artery.  It was patent .  A digital ultrasound image was acquired.  A micropuncture needle was used to access the left common femoral artery under ultrasound guidance.  An 018 wire was advanced without resistance and a micropuncture sheath was placed.  The 018 wire was removed and a benson wire was placed.  The micropuncture sheath was exchanged for a 5 french sheath.  An omniflush catheter was advanced over the wire to the level of L-1.  An abdominal angiogram was obtained.  Next, using the omniflush catheter and a benson wire, the aortic bifurcation was crossed and the catheter was placed into theright common femoral artery and right runoff was  obtained.  Pertinent findings are noted above.  We ultimately elected for intervention.  I used a  Glidewire advantage down the right SFA and upsized to a 6 [place] catapult sheath in the left groin over the aortic bifurcation.  Patient was given 100 uunits per kilogram IV heparin.  I then got a planning shot of the external iliac artery.  I stented this with a 7 mm x 40 mm Eluvia postdilated with a 6 mm Mustang.  Widely patent stent with preserved flow distally.  Wires and catheters were removed.  Given her small size we will plan manual sheath pull and a short 6 French sheath was placed in the left common femoral artery.  Plan: Excellent results.  Will start Plavix statin and she is on Eliquis.  Cephus Shelling, MD [facility] [place] Office: [phone]    Scheduled Meds:  atorvastatin  40 mg Oral Daily   carvedilol  3.125 mg Oral BID WC   clopidogrel  75 mg Oral Daily   dofetilide  125 mcg Oral BID   methimazole  5 mg Oral Daily   sodium chloride flush  3 mL Intravenous Q12H   Continuous Infusions:  sodium chloride     cefTRIAXone (ROCEPHIN)  IV 2 g (03/07/23 2023)     LOS: 5 days    Time spent: 35 mins    Willeen Niece, MD [facility]   If 7PM-7AM, please contact night-coverage

## 2023-03-08 NOTE — Progress Notes (Signed)
B/p remains soft. Pt asymptomatic. Tylenol given for pain.

## 2023-03-08 NOTE — Progress Notes (Signed)
PHARMACIST LIPID MONITORING   Joanna [name] is a 63 y.o. female admitted on 03/03/2023 with PVD.  Pharmacy has been consulted to optimize lipid-lowering therapy with the indication of secondary prevention for clinical ASCVD.  Recent Labs:  Lipid Panel (last 6 months):   Lab Results  Component Value Date   CHOL 101 03/08/2023   TRIG 88 03/08/2023   HDL 27 (L) 03/08/2023   CHOLHDL 3.7 03/08/2023   VLDL 18 03/08/2023   LDLCALC 56 03/08/2023    Hepatic function panel (last 6 months):   Lab Results  Component Value Date   AST 12 (L) 03/03/2023   ALT 8 03/03/2023   ALKPHOS 54 03/03/2023   BILITOT 0.8 03/03/2023    SCr (since admission):   Serum creatinine: 0.89 mg/dL 13/08/65 7846 Estimated creatinine clearance: 65.4 mL/min  Current therapy and lipid therapy tolerance Current lipid-lowering therapy: atorvastatin 40mg /d Documented or reported allergies or intolerances to lipid-lowering therapies (if applicable): none   Plan:    1.Statin intensity (high intensity recommended for all patients regardless of the LDL):  No statin changes. The patient is already on a high intensity statin.  2.Add ezetimibe (if any one of the following):   Not indicated at this time.  3.Refer to lipid clinic:   No  4.Follow-up with:  Primary care provider - Marcine Matar, MD  5.Follow-up labs after discharge:  No changes in lipid therapy, repeat a lipid panel in one year.      Harland German, PharmD Clinical Pharmacist **Pharmacist phone directory can now be found on [email or web address] ([email or web address]).  Listed under [facility].

## 2023-03-09 DIAGNOSIS — I739 Peripheral vascular disease, unspecified: Secondary | ICD-10-CM | POA: Diagnosis not present

## 2023-03-09 LAB — BASIC METABOLIC PANEL
Anion gap: 11 (ref 5–15)
BUN: 18 mg/dL (ref 8–23)
CO2: 21 mmol/L — ABNORMAL LOW (ref 22–32)
Calcium: 9 mg/dL (ref 8.9–10.3)
Chloride: 107 mmol/L (ref 98–111)
Creatinine, Ser: 0.83 mg/dL (ref 0.44–1.00)
GFR, Estimated: >60 mL/min (ref >60)
Glucose, Bld: 114 mg/dL — ABNORMAL HIGH (ref 70–99)
Potassium: 4 mmol/L (ref 3.5–5.1)
Sodium: 139 mmol/L (ref 135–145)

## 2023-03-09 LAB — CBC
HCT: 32.7 % — ABNORMAL LOW (ref 36.0–46.0)
Hemoglobin: 9.9 g/dL — ABNORMAL LOW (ref 12.0–15.0)
MCH: 27.2 pg (ref 26.0–34.0)
MCHC: 30.3 g/dL (ref 30.0–36.0)
MCV: 89.8 fL (ref 80.0–100.0)
Platelets: 330 10*3/uL (ref 150–400)
RBC: 3.64 MIL/uL — ABNORMAL LOW (ref 3.87–5.11)
RDW: 13 % (ref 11.5–15.5)
WBC: 8.8 10*3/uL (ref 4.0–10.5)
nRBC: 0 % (ref 0.0–0.2)

## 2023-03-09 LAB — PHOSPHORUS: Phosphorus: 3.4 mg/dL (ref 2.5–4.6)

## 2023-03-09 MED ORDER — CLOPIDOGREL BISULFATE 75 MG PO TABS: 75.0000 mg | ORAL_TABLET | Freq: Every day | ORAL | 0 refills | Status: DC

## 2023-03-09 MED ORDER — OXYCODONE HCL 5 MG PO TABS: 5.0000 mg | ORAL_TABLET | Freq: Two times a day (BID) | ORAL | 0 refills | Status: AC | PRN

## 2023-03-09 MED ORDER — ATORVASTATIN CALCIUM 40 MG PO TABS: 40.0000 mg | ORAL_TABLET | Freq: Every day | ORAL | 0 refills | Status: DC

## 2023-03-09 NOTE — Discharge Instructions (Signed)
Advised to follow-up with primary care physician in 1 week. Advised to take Plavix and statin for peripheral artery disease. Advised to continue Eliquis for anticoagulation. Advised to follow-up with vascular surgery Dr. Randie Heinz in 4 weeks.` She has completed antibiotic treatment for community-acquired pneumonia.

## 2023-03-09 NOTE — Plan of Care (Signed)
   Problem: Clinical Measurements: Goal: Will remain free from infection Outcome: Progressing Goal: Diagnostic test results will improve Outcome: Progressing

## 2023-03-09 NOTE — Discharge Summary (Signed)
Physician Discharge Summary  Joanna [name] ZOX:[id number] DOB: 10/14/1960 DOA: 03/03/2023  PCP: Marcine Matar, MD  Admit date: 03/03/2023  Discharge date: 03/09/2023  Admitted From: Home.  Disposition:  Home.  Recommendations for Outpatient Follow-up:  Follow up with PCP in 1-2 weeks. Please obtain BMP/CBC in one week. Advised to take Plavix and statin for peripheral artery disease. Advised to continue Eliquis for anticoagulation. Advised to follow-up with vascular surgery Dr. Randie Heinz in 4 weeks.` She has completed antibiotic treatment for community-acquired pneumonia.  Home Health: None Equipment/Devices: DME Dan Humphreys, DME [facility]  Discharge Condition: Stable CODE STATUS:Full code Diet recommendation: Heart Healthy   Brief [facility] Course: This 63 yrs old female with PMH significant of A-fib, CHF, hypertension who presented to the ED with c/o: 1 month of fatigue, lightheadedness. Patient was recently treated for a dental infection. She completed the prescription ABX however developed fever and chills. She was unable to get out of bed. She was also endorsing foot pain. EMS was called and she was found to be hypotensive with systolic in the 80s. She was brought to the ER where she was afebrile and hemodynamically stable. Patient underwent x-ray of chest which showed chronic findings,  X-ray Right foot showed no acute fracture.  Patient was started on broad-spectrum antibiotics due to the lactic acidosis and concern for sepsis.  She was admitted for further evaluation.  Patient has completed antibiotics for suspected community-acquired pneumonia for 5 days.  ABI was done given right foot pain which was abnormal.  Vascular surgery was consulted, She is found to have poor perfusion.  Patient underwent aortogram with right leg runoff, status post right external iliac artery stenting for high-grade stenosis.  Vascular surgery recommended to continue Plavix and statin and continue Eliquis for  A-fib.  Advised to follow-up with vascular surgery in 1 month.  Patient reports pain is better.  She wants to be discharged.  Patient being discharged home.  Discharge Diagnoses:  Active Problems:   Sepsis (HCC)  Sepsis likely due to suspected pneumonia: Patient presented with tachycardia, tachypnea, lactic acidosis,  hypotension. Lactic acid 3.2, improved with IV hydration. Continue gentle volume resuscitation. Initiated on empiric antibiotics (vancomycin and cefepime) deescalated to ceftriaxone. Blood and urine cultures no growth so far. CT chest showed mild patchy airspace opacity right lower lobe worrisome for pneumonia. Antibiotics discontinued, sepsis resolved.   Right foot pain: PAD ( High grade stenosis) Patient has palpable pulses however foot feels cold. Patient endorsing pain with movement and claudication with walking ABI index abnormal on the right side.  Vascular surgery consulted. Poor perfusion could be due to cardiogenic shock. Patient underwent aortogram with right leg runoff , status post right external iliac artery stenting for high-grade stenosis. Vascular surgery recommended to continue Plavix,  statin and Eliquis. Advised to follow-up in 1 month with right leg iliac duplex and ABI. Vascular surgery signed off.   COPD: No acute exacerbation. Continue home inhalers.   History of paroxysmal A-fib: Continue Tikosyn . Eliquis kept on hold for angiogram.. Heart rate controlled. Continue Coreg. Continue IV heparin in the meantime. Eliquis resumed and procedures done.   History of heart failure reduced ejection fraction: Patient taking Lasix at home.  Last echocardiogram was obtained on 02/07/23 which showed EF 40 to 45%.  Continue to monitor volume status in setting of resuscitation.  Hold spironolactone and Entresto due to hypotension.   Essential Hypertension:  Continue Coreg.   Hyperthyroidism: Continue methimazole   Hypomagnesemia: Replaced.  Continue  to  monitor   Acute kidney injury: > Resolved Likely due to sepsis. Creatinine 1.32, baseline normal. Avoid nephrotoxic medications. Renal functions normalized.   Splenic lesion/bilateral adrenal nodules: Follow-up outpatient.  Discharge Instructions  Discharge Instructions     Call MD for:  difficulty breathing, headache or visual disturbances   Complete by: As directed    Call MD for:  persistant dizziness or light-headedness   Complete by: As directed    Call MD for:  persistant nausea and vomiting   Complete by: As directed    Diet - low sodium heart healthy   Complete by: As directed    Diet Carb Modified   Complete by: As directed    Discharge instructions   Complete by: As directed    Advised to follow-up with primary care physician in 1 week. Advised to take Plavix and statin for peripheral artery disease. Advised to continue Eliquis for anticoagulation. Advised to follow-up with vascular surgery Dr. Randie Heinz in 4 weeks.` She has completed antibiotic treatment for community-acquired pneumonia.   Increase activity slowly   Complete by: As directed       Allergies as of 03/09/2023   No Known Allergies      Medication List     STOP taking these medications    acetaminophen 500 MG tablet Commonly known as: TYLENOL   Entresto 97-103 MG Generic drug: sacubitril-valsartan   magnesium oxide 400 (240 Mg) MG tablet Commonly known as: MAG-OX       TAKE these medications    atorvastatin 40 MG tablet Commonly known as: LIPITOR Take 1 tablet (40 mg total) by mouth daily. Start taking on: March 10, 2023   carvedilol 3.125 MG tablet Commonly known as: COREG Take 3.125 mg by mouth 2 (two) times daily.   clopidogrel 75 MG tablet Commonly known as: PLAVIX Take 1 tablet (75 mg total) by mouth daily. Start taking on: March 10, 2023   dofetilide 125 MCG capsule Commonly known as: TIKOSYN Take 1 capsule (125 mcg total) by mouth 2 (two) times daily.    Eliquis 5 MG Tabs tablet Generic drug: apixaban TAKE 1 TABLET BY MOUTH TWICE  DAILY   Farxiga 10 MG Tabs tablet Generic drug: dapagliflozin propanediol Take 10 mg by mouth daily.   feeding supplement Liqd Take 237 mLs by mouth 3 (three) times daily with meals.   furosemide 40 MG tablet Commonly known as: LASIX Take 0.5 tablets (20 mg total) by mouth daily.   methimazole 5 MG tablet Commonly known as: TAPAZOLE Take 1 tablet (5 mg total) by mouth daily.   oxyCODONE 5 MG immediate release tablet Commonly known as: Oxy IR/ROXICODONE Take 1 tablet (5 mg total) by mouth every 12 (twelve) hours as needed for up to 3 days for severe pain (pain score 7-10) or moderate pain (pain score 4-6).   potassium chloride 10 MEQ tablet Commonly known as: KLOR-CON TAKE 1 TABLET(10 MEQ) BY MOUTH DAILY   spironolactone 25 MG tablet Commonly known as: ALDACTONE TAKE 1/2 TABLET(12.5 MG) BY MOUTH DAILY               Durable Medical Equipment  (From admission, onward)           Start     Ordered   03/09/23 1131  For home use only DME Bedside commode  Once       Question:  Patient needs a bedside commode to treat with the following condition  Answer:  Weakness   03/09/23 1131   03/09/23  1026  DME Walker  Once       Question Answer Comment  Walker: With 5 Inch Wheels   Patient needs a walker to treat with the following condition Weakness generalized      03/09/23 1026            Follow-up Information     Maeola Harman, MD Follow up in 4 week(s).   Specialties: Vascular Surgery, Cardiology Why: Office will call to arrange your appt(s) (sent) Contact information: [address] [place] [address] [phone]         Marcine Matar, MD Follow up in 1 week(s).   Specialty: Internal Medicine Contact information: [address] [place] [address] [place] [address] [phone]                No Known Allergies  Consultations: Vascular  surgery   Procedures/Studies: PERIPHERAL VASCULAR CATHETERIZATION Result Date: 03/07/2023 Images from the original result were not included.   Patient name: Joanna [name]         MRN: [id number]        DOB: 1960-10-03          Sex: female  03/07/2023 Pre-operative Diagnosis: Critical limb ischemia of the right lower extremity with rest pain Post-operative diagnosis:  Same Surgeon:  Cephus Shelling, MD Procedure Performed: 1.  Ultrasound-guided access left common femoral artery 2.  Aortogram with catheter selection of aorta 3.  Right lower extremity arteriogram with catheter selection of the right common femoral artery 4.  Right external iliac artery angioplasty with stent placement (7 mm x 40 mm Eluvia postdilated with a 6 mm Mustang) 5.  36 minutes of monitored moderate conscious sedation time  Indications: 63 year old female seen with critical limb ischemia with rest pain in the right lower extremity.  She had no right femoral pulse.  She presents today for lower extremity arteriogram with a focus on the right leg after risks benefits discussed.  Findings:  Ultrasound-guided access left common femoral artery.  Aortogram showed patent infrarenal aorta.  On the right her common iliac was widely patent.  Distally she had a high-grade 90% external iliac artery stenosis.  Common femoral and profunda SFA were patent as well as the popliteal artery.  She has dominant runoff in the posterior tibial.  The peroneal occludes above the ankle as well as the anterior tibial.  The right external iliac artery high-grade stenosis with stented with a 7 mm Eluvia postdilated with a 6 mm Mustang with widely patent stent at completion and no residual stenosis.  Preserved runoff distally.             Procedure:  The patient was identified in the holding area and taken to room 8.  The patient was then placed supine on the table and prepped and draped in the usual sterile fashion.  A time out was called.  Patient received Versed  and fentanyl for conscious moderate sedation.  Vital signs were monitored including heart rate, respiratory rate, oxygenation and blood pressure.  Ultrasound was used to evaluate the left common femoral artery.  It was patent .  A digital ultrasound image was acquired.  A micropuncture needle was used to access the left common femoral artery under ultrasound guidance.  An 018 wire was advanced without resistance and a micropuncture sheath was placed.  The 018 wire was removed and a benson wire was placed.  The micropuncture sheath was exchanged for a 5 french sheath.  An omniflush catheter was  advanced over the wire to the level of L-1.  An abdominal angiogram was obtained.  Next, using the omniflush catheter and a benson wire, the aortic bifurcation was crossed and the catheter was placed into theright common femoral artery and right runoff was obtained.  Pertinent findings are noted above.  We ultimately elected for intervention.  I used a Glidewire advantage down the right SFA and upsized to a 6 [place] catapult sheath in the left groin over the aortic bifurcation.  Patient was given 100 uunits per kilogram IV heparin.  I then got a planning shot of the external iliac artery.  I stented this with a 7 mm x 40 mm Eluvia postdilated with a 6 mm Mustang.  Widely patent stent with preserved flow distally.  Wires and catheters were removed.  Given her small size we will plan manual sheath pull and a short 6 French sheath was placed in the left common femoral artery.  Plan: Excellent results.  Will start Plavix statin and she is on Eliquis.  Cephus Shelling, MD [facility] Office: [phone]    VAS [place] LOWER EXTREMITY ARTERIAL DUPLEX Result Date: 03/06/2023 LOWER EXTREMITY ARTERIAL DUPLEX STUDY Patient Name:  Joanna [name]  Date of Exam:   03/06/2023 Medical Rec #: [id number]        Accession #:    [id number] Date of Birth: 11/20/1960        Patient Gender: F Patient Age:   62  years Exam Location:  [facility] Procedure:      VAS [place] LOWER EXTREMITY ARTERIAL DUPLEX Referring Phys: Lemar Livings --------------------------------------------------------------------------------  Indications: Peripheral artery disease. High Risk Factors: Hypertension, current smoker. Other Factors: CHF, Afib.  Current ABI: RT 0.60 LT 1.11 Comparison Study: No previous exams Performing Technologist: Jody Hill RVT, RDMS  Examination Guidelines: A complete evaluation includes B-mode imaging, spectral Doppler, color Doppler, and power Doppler as needed of all accessible portions of each vessel. Bilateral testing is considered an integral part of a complete examination. Limited examinations for reoccurring indications may be performed as noted.  +-----------+--------+-----+--------+-------------------+--------+ RIGHT      PSV cm/sRatioStenosisWaveform           Comments +-----------+--------+-----+--------+-------------------+--------+ CFA Mid    57                   monophasic                  +-----------+--------+-----+--------+-------------------+--------+ CFA Distal 54                   monophasic                  +-----------+--------+-----+--------+-------------------+--------+ DFA        40                   monophasic                  +-----------+--------+-----+--------+-------------------+--------+ SFA Prox   62                   monophasic                  +-----------+--------+-----+--------+-------------------+--------+ SFA Mid    58                   monophasic                  +-----------+--------+-----+--------+-------------------+--------+ SFA Distal 54  monophasic                  +-----------+--------+-----+--------+-------------------+--------+ POP Prox   47                   monophasic                  +-----------+--------+-----+--------+-------------------+--------+ POP Distal 35                   monophasic                   +-----------+--------+-----+--------+-------------------+--------+ TP Trunk   35                   monophasic                  +-----------+--------+-----+--------+-------------------+--------+ ATA Prox   40                   monophasic                  +-----------+--------+-----+--------+-------------------+--------+ ATA Mid    22                   monophasic                  +-----------+--------+-----+--------+-------------------+--------+ ATA Distal 14                   monophasic                  +-----------+--------+-----+--------+-------------------+--------+ PTA Prox   23                   dampened monophasic         +-----------+--------+-----+--------+-------------------+--------+ PTA Mid    17                   dampened monophasic         +-----------+--------+-----+--------+-------------------+--------+ PTA Distal 24                   dampened monophasic         +-----------+--------+-----+--------+-------------------+--------+ PERO Prox  62                   monophasic                  +-----------+--------+-----+--------+-------------------+--------+ PERO Mid   31                   monophasic                  +-----------+--------+-----+--------+-------------------+--------+ PERO Distal21                   dampened monophasic         +-----------+--------+-----+--------+-------------------+--------+ DP         45                   monophasic                  +-----------+--------+-----+--------+-------------------+--------+  Summary: Right: Calcific plaque seen throughout extremity without evidence of significant stenosis. Monophasic waveforms at level of CFA indicative of inflow disease.  See table(s) above for measurements and observations. Electronically signed by Heath Lark on 03/06/2023 at 4:14:18 PM.    Final    VAS [place] ABI WITH/WO TBI Result Date: 03/04/2023  LOWER EXTREMITY DOPPLER STUDY  Patient Name:  Joanna [name]  Date  of Exam:   03/04/2023 Medical Rec #: [id number]        Accession #:    [id number] Date of Birth: [birth year]        Patient Gender: F Patient Age:   55 years Exam Location:  [facility] Procedure:      VAS [place] ABI WITH/WO TBI Referring Phys: Alan Mulder --------------------------------------------------------------------------------  Indications: Claudication, and rest pain. High Risk Factors: Hypertension.  Performing Technologist: Shona Simpson  Examination Guidelines: A complete evaluation includes at minimum, Doppler waveform signals and systolic blood pressure reading at the level of bilateral brachial, anterior tibial, and posterior tibial arteries, when vessel segments are accessible. Bilateral testing is considered an integral part of a complete examination. Photoelectric Plethysmograph (PPG) waveforms and toe systolic pressure readings are included as required and additional duplex testing as needed. Limited examinations for reoccurring indications may be performed as noted.  ABI Findings: +---------+------------------+-----+-------------------+--------+ Right    Rt Pressure (mmHg)IndexWaveform           Comment  +---------+------------------+-----+-------------------+--------+ Brachial 125                    biphasic                    +---------+------------------+-----+-------------------+--------+ PTA      54                0.42 dampened monophasic         +---------+------------------+-----+-------------------+--------+ DP       78                0.60 dampened monophasic         +---------+------------------+-----+-------------------+--------+ Great Toe0                 0.00 Absent                      +---------+------------------+-----+-------------------+--------+ +---------+------------------+-----+----------+-------+ Left     Lt Pressure (mmHg)IndexWaveform  Comment  +---------+------------------+-----+----------+-------+ Brachial 130                    triphasic         +---------+------------------+-----+----------+-------+ PTA      144               1.11 monophasic        +---------+------------------+-----+----------+-------+ DP       139               1.07 monophasic        +---------+------------------+-----+----------+-------+ Great Toe103               0.79 Normal            +---------+------------------+-----+----------+-------+ +-------+-----------+-----------+------------+------------+ ABI/TBIToday's ABIToday's TBIPrevious ABIPrevious TBI +-------+-----------+-----------+------------+------------+ Right  0.6        0                                   +-------+-----------+-----------+------------+------------+ Left   1.11       0.79                                +-------+-----------+-----------+------------+------------+  Summary: Right: Resting right ankle-brachial index indicates moderate right lower extremity arterial disease. The right toe-brachial index is abnormal. Left: Resting left ankle-brachial index is within normal range. The left toe-brachial index is normal. Although ankle  brachial indices are within normal limits (0.95-1.29), arterial Doppler waveforms at the ankle suggest some component of arterial occlusive disease. *See table(s) above for measurements and observations.  Electronically signed by Gerarda Fraction on 03/04/2023 at 5:19:00 PM.    Final    CT CHEST ABDOMEN PELVIS WO CONTRAST Result Date: 03/04/2023 CLINICAL DATA:  Sepsis. EXAM: CT CHEST, ABDOMEN AND PELVIS WITHOUT CONTRAST TECHNIQUE: Multidetector CT imaging of the chest, abdomen and pelvis was performed following the standard protocol without IV contrast. RADIATION DOSE REDUCTION: This exam was performed according to the departmental dose-optimization program which includes automated exposure control, adjustment of the mA and/or kV according to  patient size and/or use of iterative reconstruction technique. COMPARISON:  CT angiogram chest 08/29/2005 FINDINGS: CT CHEST FINDINGS Cardiovascular: Heart and aorta are normal in size. There are atherosclerotic calcifications in the aorta. There is a trace pericardial effusion. Mediastinum/Nodes: Stable mildly enlarged precarinal lymph node. No other enlarged lymph nodes are seen allowing for lack of intravenous contrast. Visualized esophagus and thyroid gland are within normal limits. Lungs/Pleura: Paraseptal emphysematous changes are mild-to-moderate and have increased in the upper lungs. There is mild patchy airspace opacity in the posteroinferior right lower lobe. There is no pleural effusion or pneumothorax. Trachea and central airways are patent. There are calcified granulomas in the right lower lung. Musculoskeletal: No chest wall mass or suspicious bone lesions identified. CT ABDOMEN PELVIS FINDINGS Hepatobiliary: No focal liver abnormality is seen. No gallstones, gallbladder wall thickening, or biliary dilatation. Pancreas: Unremarkable. No pancreatic ductal dilatation or surrounding inflammatory changes. Spleen: There is an indeterminate hypodense lesion in the anterior spleen measuring 2.4 x 2.7 cm. The spleen is normal in size. Adrenals/Urinary Tract: Bilateral adrenal nodules are present, indeterminate. Right adrenal nodule measures up to 17 mm and left adrenal nodule measures up to 2.4 cm. There is a single punctate calculus in the right kidney. There is no definite hydronephrosis. There is mild bilateral perinephric fat stranding, nonspecific. The bladder is within normal limits. Stomach/Bowel: Stomach is within normal limits. Appendix is not seen. No evidence of bowel wall thickening, distention, or inflammatory changes. Vascular/Lymphatic: Aortic atherosclerosis. No enlarged abdominal or pelvic lymph nodes. Reproductive: Uterus is heterogeneous, lobulated and mildly enlarged containing multiple  calcifications, likely due to fibroids. Calcified fibroids measure up to 16 mm. The ovaries are not well delineated on this study. Other: There is no ascites.  There is mild body wall edema. Musculoskeletal: Degenerative changes affect the spine. There also severe degenerative changes of the right hip. IMPRESSION: 1. Mild patchy airspace opacity in the right lower lobe worrisome for pneumonia. 2. Emphysema. 3. Trace pericardial effusion. 4. Indeterminate hypodense lesion in the spleen. This can be further evaluated with MRI. 5. Bilateral adrenal nodules, indeterminate. 6. Nonobstructing right renal calculus. 7. Fibroid uterus. 8. Mild body wall edema. Aortic Atherosclerosis (ICD10-I70.0) and Emphysema (ICD10-J43.9). Electronically Signed   By: Darliss Cheney M.D.   On: 03/04/2023 01:42   DG Foot Complete Right Result Date: 03/03/2023 CLINICAL DATA:  Right foot pain EXAM: RIGHT FOOT COMPLETE - 3 VIEW COMPARISON:  None Available. FINDINGS: There is no evidence of fracture or dislocation. There is no evidence of arthropathy or other focal bone abnormality. Soft tissues are unremarkable. IMPRESSION: No acute fracture or dislocation. Electronically Signed   By: Agustin Cree M.D.   On: 03/03/2023 19:42   DG Chest Portable 1 View Result Date: 03/03/2023 CLINICAL DATA:  Dyspnea. EXAM: PORTABLE CHEST 1 VIEW COMPARISON:  Chest radiograph dated December 20, 2021.  FINDINGS: Stable cardiomegaly. Mediastinal contours are within normal limits. Aortic atherosclerosis. Bilateral interstitial coarsening. No focal consolidation, pleural effusion, or pneumothorax. No acute osseous abnormality. IMPRESSION: Stable cardiomegaly. Bilateral interstitial coarsening could reflect chronic interstitial changes, edema, or atypical infection. Electronically Signed   By: Hart Robinsons M.D.   On: 03/03/2023 18:27   ECHOCARDIOGRAM COMPLETE Result Date: 02/07/2023    ECHOCARDIOGRAM REPORT   Patient Name:   Joanna [name] Date of Exam:  02/07/2023 Medical Rec #:  [id number]       Height:       72.0 in Accession #:    [id number]      Weight:       142.0 lb Date of Birth:  May 12, 1960       BSA:          1.842 m Patient Age:    62 years        BP:           118/82 mmHg Patient Gender: F               HR:           84 bpm. Exam Location:  Outpatient Procedure: 2D Echo, 3D Echo, Cardiac Doppler and Color Doppler Indications:    CHF  History:        Patient has prior history of Echocardiogram examinations, most                 recent 12/21/2021. CHF and Cardiomyopathy, COPD,                 Arrythmias:Atrial Fibrillation, Signs/Symptoms:Shortness of                 Breath; Risk Factors:Hypertension and Former Smoker.  Sonographer:    Vern Claude Referring Phys: [id number] ADITYA SABHARWAL  Sonographer Comments: Patient persistently moving. IMPRESSIONS  1. Global hypokinesis with abnormal septal motion . Left ventricular ejection fraction, by estimation, is 40 to 45%. The left ventricle has mildly decreased function. The left ventricle demonstrates global hypokinesis. The left ventricular internal cavity size was mildly dilated. Left ventricular diastolic parameters were normal.  2. Right ventricular systolic function is normal. The right ventricular size is normal. There is mildly elevated pulmonary artery systolic pressure.  3. Left atrial size was moderately dilated.  4. IVC not dilated No diastolic RA/RV collapse No evidence of tamponade . Small to moderate moslty RV anterior pericardial effusion.  5. The mitral valve is abnormal. Mild mitral valve regurgitation. No evidence of mitral stenosis.  6. Tricuspid valve regurgitation is moderate.  7. The aortic valve is tricuspid. There is mild calcification of the aortic valve. There is mild thickening of the aortic valve. Aortic valve regurgitation is not visualized. Aortic valve sclerosis is present, with no evidence of aortic valve stenosis.  8. The inferior vena cava is normal in size with greater than 50%  respiratory variability, suggesting right atrial pressure of 3 mmHg. FINDINGS  Left Ventricle: Global hypokinesis with abnormal septal motion. Left ventricular ejection fraction, by estimation, is 40 to 45%. The left ventricle has mildly decreased function. The left ventricle demonstrates global hypokinesis. The left ventricular internal cavity size was mildly dilated. There is no left ventricular hypertrophy. Left ventricular diastolic parameters were normal. Right Ventricle: The right ventricular size is normal. No increase in right ventricular wall thickness. Right ventricular systolic function is normal. There is mildly elevated pulmonary artery systolic pressure. The tricuspid regurgitant velocity is 3.08  m/s, and with an assumed  right atrial pressure of 3 mmHg, the estimated right ventricular systolic pressure is 40.9 mmHg. Left Atrium: Left atrial size was moderately dilated. Right Atrium: Right atrial size was normal in size. Pericardium: IVC not dilated No diastolic RA/RV collapse No evidence of tamponade. Small to moderate moslty RV anterior pericardial effusion. Mitral Valve: The mitral valve is abnormal. There is moderate thickening of the mitral valve leaflet(s). Mild mitral valve regurgitation. No evidence of mitral valve stenosis. MV peak gradient, 1.9 mmHg. The mean mitral valve gradient is 1.0 mmHg. Tricuspid Valve: The tricuspid valve is normal in structure. Tricuspid valve regurgitation is moderate . No evidence of tricuspid stenosis. Aortic Valve: The aortic valve is tricuspid. There is mild calcification of the aortic valve. There is mild thickening of the aortic valve. Aortic valve regurgitation is not visualized. Aortic valve sclerosis is present, with no evidence of aortic valve stenosis. Aortic valve mean gradient measures 2.0 mmHg. Aortic valve peak gradient measures 6.1 mmHg. Aortic valve area, by VTI measures 2.79 cm. Pulmonic Valve: The pulmonic valve was normal in structure. Pulmonic  valve regurgitation is mild. No evidence of pulmonic stenosis. Aorta: The aortic root is normal in size and structure. Venous: The inferior vena cava is normal in size with greater than 50% respiratory variability, suggesting right atrial pressure of 3 mmHg. IAS/Shunts: No atrial level shunt detected by color flow Doppler.  LEFT VENTRICLE PLAX 2D LVIDd:         4.80 cm      Diastology LVIDs:         3.80 cm      LV e' medial:    5.66 cm/s LV PW:         0.80 cm      LV E/e' medial:  7.0 LV IVS:        0.80 cm      LV e' lateral:   8.38 cm/s LVOT diam:     2.00 cm      LV E/e' lateral: 4.7 LV SV:         50 LV SV Index:   27 LVOT Area:     3.14 cm                              3D Volume EF: LV Volumes (MOD)            3D EF:        40 % LV vol d, MOD A2C: 113.0 ml LV EDV:       112 ml LV vol d, MOD A4C: 142.0 ml LV ESV:       68 ml LV vol s, MOD A2C: 60.7 ml  LV SV:        44 ml LV vol s, MOD A4C: 83.4 ml LV SV MOD A2C:     52.3 ml LV SV MOD A4C:     142.0 ml LV SV MOD BP:      53.7 ml RIGHT VENTRICLE             IVC RV Basal diam:  3.10 cm     IVC diam: 1.60 cm RV Mid diam:    2.40 cm RV S prime:     11.50 cm/s TAPSE (M-mode): 2.6 cm LEFT ATRIUM             Index        RIGHT ATRIUM  Index LA diam:        4.30 cm 2.33 cm/m   RA Area:     9.12 cm LA Vol (A2C):   86.6 ml 47.02 ml/m  RA Volume:   17.60 ml 9.56 ml/m LA Vol (A4C):   78.2 ml 42.45 ml/m LA Biplane Vol: 88.2 ml 47.88 ml/m  AORTIC VALVE                    PULMONIC VALVE AV Area (Vmax):    2.22 cm     PV Vmax:          0.95 m/s AV Area (Vmean):   2.47 cm     PV Peak grad:     3.6 mmHg AV Area (VTI):     2.79 cm     PR End Diast Vel: 3.55 msec AV Vmax:           123.00 cm/s AV Vmean:          68.000 cm/s AV VTI:            0.178 m AV Peak Grad:      6.1 mmHg AV Mean Grad:      2.0 mmHg LVOT Vmax:         87.00 cm/s LVOT Vmean:        53.500 cm/s LVOT VTI:          0.158 m LVOT/AV VTI ratio: 0.89  AORTA Ao Root diam: 3.30 cm Ao Asc diam:  2.80  cm MITRAL VALVE               TRICUSPID VALVE MV Area (PHT): 2.43 cm    TR Peak grad:   37.9 mmHg MV Area VTI:   3.65 cm    TR Vmax:        308.00 cm/s MV Peak grad:  1.9 mmHg MV Mean grad:  1.0 mmHg    SHUNTS MV Vmax:       0.69 m/s    Systemic VTI:  0.16 m MV Vmean:      46.6 cm/s   Systemic Diam: 2.00 cm MV Decel Time: 312 msec MV E velocity: 39.70 cm/s MV A velocity: 55.90 cm/s MV E/A ratio:  0.71 Charlton Haws MD Electronically signed by Charlton Haws MD Signature Date/Time: 02/07/2023/2:56:53 PM    Final       Subjective: Patient was seen and examined at bedside.  Overnight events noted.  Patient reports doing much better. Pain has improved.  Patient is status post vascular stenting.  Tolerated well.  She wants to be discharged.  Discharge Exam: Vitals:   03/09/23 0900 03/09/23 1230  BP: 91/60 97/64  Pulse: 76 85  Resp: 18 15  Temp: 97.9 F (36.6 C) 98.4 F (36.9 C)  SpO2: 99% 100%   Vitals:   03/08/23 2300 03/09/23 0300 03/09/23 0900 03/09/23 1230  BP: (!) 84/57 90/65 91/60  97/64  Pulse: 76 68 76 85  Resp: 16 15 18 15   Temp: 98.6 F (37 C) 98.3 F (36.8 C) 97.9 F (36.6 C) 98.4 F (36.9 C)  TempSrc: Oral Oral Oral Axillary  SpO2: 99% 98% 99% 100%  Weight:      Height:        General: Pt is alert, awake, not in acute distress Cardiovascular: RRR, S1/S2 +, no rubs, no gallops Respiratory: CTA bilaterally, no wheezing, no rhonchi Abdominal: Soft, NT, ND, bowel sounds + Extremities: no edema, no cyanosis    The results of  significant diagnostics from this hospitalization (including imaging, microbiology, ancillary and laboratory) are listed below for reference.     Microbiology: Recent Results (from the past 240 hours)  Resp panel by RT-PCR (RSV, Flu A&B, Covid) Anterior Nasal Swab     Status: None   Collection Time: 03/03/23  6:13 PM   Specimen: Anterior Nasal Swab  Result Value Ref Range Status   SARS Coronavirus 2 by RT PCR NEGATIVE NEGATIVE Final   Influenza  A by PCR NEGATIVE NEGATIVE Final   Influenza B by PCR NEGATIVE NEGATIVE Final    Comment: (NOTE) The Xpert Xpress SARS-CoV-2/FLU/RSV plus assay is intended as an aid in the diagnosis of influenza from Nasopharyngeal swab specimens and should not be used as a sole basis for treatment. Nasal washings and aspirates are unacceptable for Xpert Xpress SARS-CoV-2/FLU/RSV testing.  Fact Sheet for Patients: BloggerCourse.com  Fact Sheet for Healthcare Providers: SeriousBroker.it  This test is not yet approved or cleared by the [place] FDA and has been authorized for detection and/or diagnosis of SARS-CoV-2 by FDA under an Emergency Use Authorization (EUA). This EUA will remain in effect (meaning this test can be used) for the duration of the COVID-19 declaration under Section 564(b)(1) of the Act, 21 U.S.C. section 360bbb-3(b)(1), unless the authorization is terminated or revoked.     Resp Syncytial Virus by PCR NEGATIVE NEGATIVE Final    Comment: (NOTE) Fact Sheet for Patients: BloggerCourse.com  Fact Sheet for Healthcare Providers: SeriousBroker.it  This test is not yet approved or cleared by the [place] FDA and has been authorized for detection and/or diagnosis of SARS-CoV-2 by FDA under an Emergency Use Authorization (EUA). This EUA will remain in effect (meaning this test can be used) for the duration of the COVID-19 declaration under Section 564(b)(1) of the Act, 21 U.S.C. section 360bbb-3(b)(1), unless the authorization is terminated or revoked.  Performed at [facility], [address] [place], [place] [address]   Blood Culture (routine x 2)     Status: None   Collection Time: 03/03/23  6:30 PM   Specimen: BLOOD RIGHT HAND  Result Value Ref Range Status   Specimen Description BLOOD RIGHT HAND  Final   Special Requests   Final    BOTTLES DRAWN AEROBIC  AND ANAEROBIC Blood Culture results may not be optimal due to an inadequate volume of blood received in culture bottles   Culture   Final    NO GROWTH 5 DAYS Performed at [facility], [address] [place], [place] [address]    Report Status 03/08/2023 FINAL  Final  Urine Culture     Status: None   Collection Time: 03/04/23 12:23 PM   Specimen: Urine, Random  Result Value Ref Range Status   Specimen Description URINE, RANDOM  Final   Special Requests NONE Reflexed from [id number]  Final   Culture   Final    NO GROWTH Performed at [facility], [address] [place], [place] [address]    Report Status 03/05/2023 FINAL  Final  MRSA Next Gen by PCR, Nasal     Status: None   Collection Time: 03/05/23 11:01 AM   Specimen: Nasal Mucosa; Nasal Swab  Result Value Ref Range Status   MRSA by PCR Next Gen NOT DETECTED NOT DETECTED Final    Comment: (NOTE) The GeneXpert MRSA Assay (FDA approved for NASAL specimens only), is one component of a comprehensive MRSA colonization surveillance program. It is not intended to diagnose  MRSA infection nor to guide or monitor treatment for MRSA infections. Test performance is not FDA approved in patients less than 39 years old. Performed at [facility], [address] [place], [place] [address]      Labs: BNP (last 3 results) Recent Labs    08/22/22 0954 01/15/23 1510 03/03/23 2002  BNP 52.6 49.7 69.2   Basic Metabolic Panel: Recent Labs  Lab 03/04/23 0415 03/04/23 1750 03/05/23 0322 03/06/23 0418 03/07/23 0307 03/08/23 1105 03/09/23 0307  NA 135 139 137 135 136 137 139  K 3.6 4.4 4.3 3.7 4.0 4.1 4.0  CL 106 111 109 104 104 105 107  CO2 20* 19* 20* 21* 22 23 21*  GLUCOSE 152* 97 108* 113* 107* 121* 114*  BUN 27* 19 15 13 15 14 18   CREATININE 1.04* 0.80 0.81 0.89 0.79 0.89 0.83  CALCIUM 8.7* 8.4* 9.2 8.9 9.0 8.9 9.0  MG 1.6* 2.2 1.9 1.7  --  1.9  --   PHOS  --   --  3.0  --   --   --  3.4   Liver Function  Tests: Recent Labs  Lab 03/03/23 1703  AST 12*  ALT 8  ALKPHOS 54  BILITOT 0.8  PROT 6.8  ALBUMIN 3.1*   No results for input(s): "LIPASE", "AMYLASE" in the last 168 hours. No results for input(s): "AMMONIA" in the last 168 hours. CBC: Recent Labs  Lab 03/03/23 1703 03/04/23 0415 03/05/23 0322 03/06/23 0418 03/07/23 0307 03/08/23 1105 03/09/23 0307  WBC 9.3   < > 10.0 12.3* 12.4* 10.1 8.8  NEUTROABS 7.2  --   --   --   --   --   --   HGB 11.8*   < > 10.9* 9.8* 10.0* 9.7* 9.9*  HCT 40.6   < > 35.6* 31.0* 32.4* 30.6* 32.7*  MCV 95.8   < > 89.9 88.1 89.8 88.7 89.8  PLT 336   < > 311 282 286 319 330   < > = values in this interval not displayed.   Cardiac Enzymes: No results for input(s): "CKTOTAL", "CKMB", "CKMBINDEX", "TROPONINI" in the last 168 hours. BNP: Invalid input(s): "POCBNP" CBG: No results for input(s): "GLUCAP" in the last 168 hours. D-Dimer No results for input(s): "DDIMER" in the last 72 hours. Hgb A1c No results for input(s): "HGBA1C" in the last 72 hours. Lipid Profile Recent Labs    03/08/23 1105  CHOL 101  HDL 27*  LDLCALC 56  TRIG 88  CHOLHDL 3.7   Thyroid function studies No results for input(s): "TSH", "T4TOTAL", "T3FREE", "THYROIDAB" in the last 72 hours.  Invalid input(s): "FREET3" Anemia work up No results for input(s): "VITAMINB12", "FOLATE", "FERRITIN", "TIBC", "IRON", "RETICCTPCT" in the last 72 hours. Urinalysis    Component Value Date/Time   COLORURINE YELLOW 03/04/2023 1223   APPEARANCEUR HAZY (A) 03/04/2023 1223   LABSPEC 1.010 03/04/2023 1223   PHURINE 5.0 03/04/2023 1223   GLUCOSEU NEGATIVE 03/04/2023 1223   HGBUR MODERATE (A) 03/04/2023 1223   BILIRUBINUR NEGATIVE 03/04/2023 1223   KETONESUR NEGATIVE 03/04/2023 1223   PROTEINUR NEGATIVE 03/04/2023 1223   NITRITE NEGATIVE 03/04/2023 1223   LEUKOCYTESUR LARGE (A) 03/04/2023 1223   Sepsis Labs Recent Labs  Lab 03/06/23 0418 03/07/23 0307 03/08/23 1105  03/09/23 0307  WBC 12.3* 12.4* 10.1 8.8   Microbiology Recent Results (from the past 240 hours)  Resp panel by RT-PCR (RSV, Flu A&B, Covid) Anterior Nasal Swab     Status: None  Collection Time: 03/03/23  6:13 PM   Specimen: Anterior Nasal Swab  Result Value Ref Range Status   SARS Coronavirus 2 by RT PCR NEGATIVE NEGATIVE Final   Influenza A by PCR NEGATIVE NEGATIVE Final   Influenza B by PCR NEGATIVE NEGATIVE Final    Comment: (NOTE) The Xpert Xpress SARS-CoV-2/FLU/RSV plus assay is intended as an aid in the diagnosis of influenza from Nasopharyngeal swab specimens and should not be used as a sole basis for treatment. Nasal washings and aspirates are unacceptable for Xpert Xpress SARS-CoV-2/FLU/RSV testing.  Fact Sheet for Patients: BloggerCourse.com  Fact Sheet for Healthcare Providers: SeriousBroker.it  This test is not yet approved or cleared by the [place] FDA and has been authorized for detection and/or diagnosis of SARS-CoV-2 by FDA under an Emergency Use Authorization (EUA). This EUA will remain in effect (meaning this test can be used) for the duration of the COVID-19 declaration under Section 564(b)(1) of the Act, 21 U.S.C. section 360bbb-3(b)(1), unless the authorization is terminated or revoked.     Resp Syncytial Virus by PCR NEGATIVE NEGATIVE Final    Comment: (NOTE) Fact Sheet for Patients: BloggerCourse.com  Fact Sheet for Healthcare Providers: SeriousBroker.it  This test is not yet approved or cleared by the [place] FDA and has been authorized for detection and/or diagnosis of SARS-CoV-2 by FDA under an Emergency Use Authorization (EUA). This EUA will remain in effect (meaning this test can be used) for the duration of the COVID-19 declaration under Section 564(b)(1) of the Act, 21 U.S.C. section 360bbb-3(b)(1), unless the authorization is  terminated or revoked.  Performed at [facility], [address], [place], [place] [address]   Blood Culture (routine x 2)     Status: None   Collection Time: 03/03/23  6:30 PM   Specimen: BLOOD RIGHT HAND  Result Value Ref Range Status   Specimen Description BLOOD RIGHT HAND  Final   Special Requests   Final    BOTTLES DRAWN AEROBIC AND ANAEROBIC Blood Culture results may not be optimal due to an inadequate volume of blood received in culture bottles   Culture   Final    NO GROWTH 5 DAYS Performed at [facility], [address], [place], [place] [address]    Report Status 03/08/2023 FINAL  Final  Urine Culture     Status: None   Collection Time: 03/04/23 12:23 PM   Specimen: Urine, Random  Result Value Ref Range Status   Specimen Description URINE, RANDOM  Final   Special Requests NONE Reflexed from [id number]  Final   Culture   Final    NO GROWTH Performed at [facility], [address] [place], [place] [address]    Report Status 03/05/2023 FINAL  Final  MRSA Next Gen by PCR, Nasal     Status: None   Collection Time: 03/05/23 11:01 AM   Specimen: Nasal Mucosa; Nasal Swab  Result Value Ref Range Status   MRSA by PCR Next Gen NOT DETECTED NOT DETECTED Final    Comment: (NOTE) The GeneXpert MRSA Assay (FDA approved for NASAL specimens only), is one component of a comprehensive MRSA colonization surveillance program. It is not intended to diagnose MRSA infection nor to guide or monitor treatment for MRSA infections. Test performance is not FDA approved in patients less than 10 years old. Performed at [facility], [address], [place], [place] [address]      Time coordinating discharge: Over 30 minutes  SIGNED:   Willeen Niece, MD  [facility] 03/09/2023, 1:06 PM Pager   If 7PM-7AM, please contact night-coverage

## 2023-03-09 NOTE — TOC Transition Note (Signed)
Transition of Care [facility]) - Discharge Note   Patient Details  Name: Joanna [name] MRN: [id number] Date of Birth: 1960/06/30  Transition of [facility]) CM/SW Contact:  Lawerance Sabal, RN Phone Number: 03/09/2023, 11:32 AM   Clinical Narrative:     Ordered BSC and RW to room through [facility] for potential DC later today    Barriers to Discharge: Continued Medical Work up   Patient Goals and CMS Choice            Discharge Placement                       Discharge Plan and Services Additional resources added to the After Visit Summary for                                       Social Drivers of Health (SDOH) Interventions SDOH Screenings   Food Insecurity: No Food Insecurity (03/04/2023)  Housing: Low Risk  (03/04/2023)  Transportation Needs: Unmet Transportation Needs (03/04/2023)  Utilities: Not At Risk (03/04/2023)  Alcohol Screen: Low Risk  (07/17/2022)  Depression (PHQ2-9): Low Risk  (07/17/2022)  Financial Resource Strain: Low Risk  (07/17/2022)  Physical Activity: Insufficiently Active (07/17/2022)  Social Connections: Moderately Isolated (03/04/2023)  Stress: No Stress Concern Present (07/17/2022)  Tobacco Use: High Risk (03/04/2023)     Readmission Risk Interventions     No data to display

## 2023-03-11 ENCOUNTER — Telehealth: Payer: Self-pay

## 2023-03-11 NOTE — Transitions of Care (Post Inpatient/ED Visit) (Signed)
   03/11/2023  Name: Joanna [name] MRN: [id number] DOB: September 03, 1960  Today's TOC FU Call Status: Today's TOC FU Call Status:: Successful TOC FU Call Completed TOC FU Call Complete Date: 03/11/23 Patient's Name and Date of Birth confirmed.  Transition Care Management Follow-up Telephone Call Discharge Facility: [facility]) Type of Discharge: Inpatient Admission Primary Inpatient Discharge Diagnosis:: sepsis How have you been since you were released from the hospital?: Better Any questions or concerns?: No  Items Reviewed: Did you receive and understand the discharge instructions provided?: Yes Medications obtained,verified, and reconciled?: Partial Review Completed Reason for Partial Mediation Review: She said she has all of her medications, including the atorvastatin, clopidigrel and oxycodone. She said she did not have any  questions about the meds and did not need to review the med list. Any new allergies since your discharge?: No Dietary orders reviewed?: Yes Type of Diet Ordered:: heart healthy, low sodium.  She said she had been having dificulty eating in general due to dental paln and was treated earlier in the year for a dental abscess. Do you have support at home?: Yes People in Home: sibling(s) Name of Support/Comfort Primary Source: her brother and her sister  Medications Reviewed Today: Medications Reviewed Today   Medications were not reviewed in this encounter     Home Care and Equipment/Supplies: Were Home Health Services Ordered?: No Any new equipment or medical supplies ordered?: Yes Name of Medical supply agency?: [employer] [facility] Were you able to get the equipment/medical supplies?: Yes Do you have any questions related to the use of the equipment/supplies?: No  Functional Questionnaire: Do you need assistance with bathing/showering or dressing?: No Do you need assistance with meal preparation?: Yes (her brother and sister assist) Do you need assistance  with eating?: No Do you have difficulty maintaining continence: No Do you need assistance with getting out of bed/getting out of a chair/moving?: Yes (She said her home is small and she is having difficulty navigating with the walker so she has been using her cane.) Do you have difficulty managing or taking your medications?: No  Follow up appointments reviewed: PCP Follow-up appointment confirmed?: Yes Date of PCP follow-up appointment?: 04/16/23 Follow-up Provider: Dr Laural Benes .  I offered for the patient to  be seen at another clinic or at the [facility] so she could be seen sooner but she refused  She wanted this appointment with Dr Laural Benes  and I mailed her an appointment reminder as she requested Specialist Hospital Follow-up appointment confirmed?: Yes Date of Specialist follow-up appointment?: 03/12/23 Follow-Up Specialty Provider:: cardiology and 04/17/2023- [facility].  She is not sure she will be able to keep the appointment with cardiology tomorrow and may need to re-schedule. I provider her sister with the phone number for the cardiologist as she requested Do you need transportation to your follow-up appointment?: No Do you understand care options if your condition(s) worsen?: Yes-patient verbalized understanding    SIGNATURE Robyne Peers, RN

## 2023-03-12 ENCOUNTER — Ambulatory Visit (HOSPITAL_COMMUNITY): Payer: 59 | Admitting: Internal Medicine

## 2023-03-12 ENCOUNTER — Telehealth: Payer: Self-pay

## 2023-03-12 NOTE — Progress Notes (Incomplete)
Primary Care Physician: Joanna Matar, MD Primary Cardiologist: Dr Joanna [name] Primary Electrophysiologist: Dr Joanna [name] [facility]: Dr Joanna [name] Referring Physician: Manson Passey PA   Joanna [name] is a 63 y.o. female with a history of HTN, combined chronic systolic and diastolic CHF, tobacco abuse, MR, and persistent atrial fibrillation who presents for follow up in the [facility].  The patient was initially diagnosed with atrial fibrillation on 03/08/18 at the ER after presenting with symptoms of dyspnea on exertion for one week prior. ECG at ER showed afib with RVR and she was given Lopressor and started on Cardizem drip and heparin. She underwent successful DCCV. Echo on admission showed severely reduced EF 20-25%, possibly tachycardia mediated. Discharged on Eliquis and Lopressor. On follow up, she was noted to be in afib again with RVR. Started on amiodarone which did well keeping her in SR. Unfortunately, she developed amiodarone induced hyperthyroidism and this was discontinued 10/16/21.  Patient was hospitalized 12/20/21 with acute CHF in the setting of rapid Afib. Echo showed EF back down again, 25-30% w/ global HK, LV mod-severely dilated, LA severely dilated w/ eccentric MR, posteriorly directed into LA, severe regurgitation. She was diuresed w/ IV Lasix and transitioned to PO with additions of Farxiga and spironolactone. Treated w/ Coreg and digoxin for rate control.   On follow up today, patient is s/p dofetilide admission 12/20-12/23/23. She converted with the medication and did not require DCCV. She had a post termination pause lasting 4.2 seconds, digoxin was discontinued and carvedilol decreased. Patient remains in SR today and is feeling well. She walked into the clinic instead of using the wheelchair.   On follow up 07/16/22, she is currently in NSR. S/p Tikosyn load 12/20-23/23. Seen by Joanna [name] 04/17/22 with stable Qtc and maintaining SR. She has  not noted any episodes of Afib since OV with Joanna [name]. She complains of right leg pain ongoing which is not new; she uses a cane for ambulation. She needs a refill of Tikosyn as she took her last dose this morning. No missed doses of anticoagulation.  On follow up 10/29/22, she is currently in NSR. She has had no episodes of Afib since last office visit. She feels well overall except she just received an injection to help with her right leg pain. No missed doses of Tikosyn. No bleeding issues on Eliquis.   On follow up 03/12/23, she is currently in ***. She is here for Tikosyn surveillance. She has had *** episodes of Afib since last office visit. No missed doses of Tikosyn or Eliquis.   Today, she denies symptoms of palpitations, chest pain, orthopnea, PND, lower extremity edema, dizziness, presyncope, syncope, snoring, daytime somnolence, bleeding, or neurologic sequela. The patient is tolerating medications without difficulties and is otherwise without complaint today.    Atrial Fibrillation Risk Factors:  she does not have symptoms or diagnosis of sleep apnea. she does not have a history of rheumatic fever. she does not have a history of alcohol use. The patient does not have a history of early familial atrial fibrillation or other arrhythmias.  she has a BMI of There is no height or weight on file to calculate BMI.. There were no vitals filed for this visit.   Family History  Problem Relation Age of Onset   Cancer Mother    Heart disease Father     Atrial Fibrillation Management history:  Previous antiarrhythmic drugs: amiodarone, dofetilide  Previous cardioversions: 03/14/2018 Previous ablations: none CHADS2VASC score: 3  Anticoagulation  history: Eliquis   Past Medical History:  Diagnosis Date   Atrial fibrillation with RVR (HCC)    Chronic combined systolic (congestive) and diastolic (congestive) heart failure (HCC)    Dyspnea    Goiter    Hypertension    NICM (nonischemic  cardiomyopathy) (HCC)    a. 08/2005 Echo: EF 30-35%, mod diff HK. Mild to Mod MR. Mildly dil LA; b. 08/2005 Cath: Nl Cors. Elevated CO w/o shunt; c. 09/2007 Echo: EF 45%. Mild to mod MR; c. 03/2015 Echo: EF 45%, global HK. Gr1 DD. Mild MR. Mildly dil LA.   Past Surgical History:  Procedure Laterality Date   ABDOMINAL AORTOGRAM W/LOWER EXTREMITY N/A 03/07/2023   Procedure: ABDOMINAL AORTOGRAM W/LOWER EXTREMITY;  Surgeon: Joanna Shelling, MD;  Location: [facility] INVASIVE CV LAB;  Service: Cardiovascular;  Laterality: N/A;   APPENDECTOMY     BACK SURGERY     CARDIOVERSION N/A 03/14/2018   Procedure: CARDIOVERSION;  Surgeon: Joanna Riffle, MD;  Location: [facility] ENDOSCOPY;  Service: Cardiovascular;  Laterality: N/A;   TEE WITHOUT CARDIOVERSION N/A 03/14/2018   Procedure: TRANSESOPHAGEAL ECHOCARDIOGRAM (TEE);  Surgeon: Joanna Riffle, MD;  Location: [facility] ENDOSCOPY;  Service: Cardiovascular;  Laterality: N/A;   TEE WITHOUT CARDIOVERSION N/A 02/26/2022   Procedure: TRANSESOPHAGEAL ECHOCARDIOGRAM (TEE);  Surgeon: Joanna Nettles, DO;  Location: [facility] ENDOSCOPY;  Service: Cardiovascular;  Laterality: N/A;    Current Outpatient Medications  Medication Sig Dispense Refill   atorvastatin (LIPITOR) 40 MG tablet Take 1 tablet (40 mg total) by mouth daily. 30 tablet 0   carvedilol (COREG) 3.125 MG tablet Take 3.125 mg by mouth 2 (two) times daily.     clopidogrel (PLAVIX) 75 MG tablet Take 1 tablet (75 mg total) by mouth daily. 30 tablet 0   dapagliflozin propanediol (FARXIGA) 10 MG TABS tablet TAKE 1 TABLET BY MOUTH DAILY 90 tablet 1   dofetilide (TIKOSYN) 125 MCG capsule Take 1 capsule (125 mcg total) by mouth 2 (two) times daily. 60 capsule 3   ELIQUIS 5 MG TABS tablet TAKE 1 TABLET BY MOUTH TWICE  DAILY 200 tablet 2   feeding supplement (ENSURE IMMUNE HEALTH) LIQD Take 237 mLs by mouth 3 (three) times daily with meals.     furosemide (LASIX) 40 MG tablet Take 0.5 tablets (20 mg total) by mouth daily. 45 tablet 1    methimazole (TAPAZOLE) 5 MG tablet Take 1 tablet (5 mg total) by mouth daily. 90 tablet 3   oxyCODONE (OXY IR/ROXICODONE) 5 MG immediate release tablet Take 1 tablet (5 mg total) by mouth every 12 (twelve) hours as needed for up to 3 days for severe pain (pain score 7-10) or moderate pain (pain score 4-6). 6 tablet 0   potassium chloride (KLOR-CON) 10 MEQ tablet TAKE 1 TABLET(10 MEQ) BY MOUTH DAILY 90 tablet 3   spironolactone (ALDACTONE) 25 MG tablet TAKE 1/2 TABLET(12.5 MG) BY MOUTH DAILY 45 tablet 3   No current facility-administered medications for this visit.    No Known Allergies  ROS- All systems are reviewed and negative except as per the HPI above.  Physical Exam: There were no vitals filed for this visit.  GEN- The patient is well appearing, alert and oriented x 3 today.   Neck - no JVD or carotid bruit noted Lungs- Clear to ausculation bilaterally, normal work of breathing Heart- ***Regular rate and rhythm, no murmurs, rubs or gallops, PMI not laterally displaced Extremities- no clubbing, cyanosis, or edema Skin - no rash or ecchymosis noted  Wt Readings from Last 3 Encounters:  03/03/23 64 kg  01/15/23 64.4 kg  11/13/22 66.4 kg    EKG today demonstrates ***  Echo 02/07/23:  1. Global hypokinesis with abnormal septal motion . Left ventricular  ejection fraction, by estimation, is 40 to 45%. The left ventricle has  mildly decreased function. The left ventricle demonstrates global  hypokinesis. The left ventricular internal  cavity size was mildly dilated. Left ventricular diastolic parameters were  normal.   2. Right ventricular systolic function is normal. The right ventricular  size is normal. There is mildly elevated pulmonary artery systolic  pressure.   3. Left atrial size was moderately dilated.   4. IVC not dilated No diastolic RA/RV collapse No evidence of tamponade .  Small to moderate moslty RV anterior pericardial effusion.   5. The mitral valve is  abnormal. Mild mitral valve regurgitation. No  evidence of mitral stenosis.   6. Tricuspid valve regurgitation is moderate.   7. The aortic valve is tricuspid. There is mild calcification of the  aortic valve. There is mild thickening of the aortic valve. Aortic valve  regurgitation is not visualized. Aortic valve sclerosis is present, with  no evidence of aortic valve stenosis.   8. The inferior vena cava is normal in size with greater than 50%  respiratory variability, suggesting right atrial pressure of 3 mmHg.   Epic records are reviewed at length today  CHA2DS2-VASc Score =    The patient's score is based upon:    {Confirm score is correct.  If not, click here to update score.  REFRESH note.  :1}     ASSESSMENT AND PLAN: 1. Persistent Atrial Fibrillation (ICD10:  I48.19) The patient's CHA2DS2-VASc score is  , indicating a  % annual risk of stroke.   Patient off amiodarone due to hyperthyroidism, on methimazole.  S/p dofetilide admission 12/20-12/23/23.  She is currently in ***. Continue coreg 3.125 mg BID.  High risk medication monitoring (ICD10: R7229428) Patient requires ongoing monitoring for anti-arrhythmic medication which has the potential to cause life threatening arrhythmias or AV block. Qtc stable. Continue Tikosyn 125 mcg BID. Bmet and mag drawn today.   2. Secondary Hypercoagulable State (ICD10:  D68.69) The patient is at significant risk for stroke/thromboembolism based upon her CHA2DS2-VASc Score of  .  Continue Apixaban (Eliquis).  No missed doses of Eliquis 5 mg BID.   3. Chronic systolic CHF Appears euvolemic today.   Follow up in 6 months Afib clinic for Tikosyn surveillance.   Lake Bells, PA-C Afib Clinic [facility] [address] [place], [place] [address] [phone] 03/12/2023 9:50 AM

## 2023-03-12 NOTE — Transitions of Care (Post Inpatient/ED Visit) (Signed)
   03/12/2023  Name: Joanna [name] MRN: [id number] DOB: Jan 24, 1961  Today's TOC FU Call Status: Today's TOC FU Call Status:: Unsuccessful Call (2nd Attempt) Unsuccessful Call (2nd Attempt) Date: 03/12/23  Attempted to reach the patient regarding the most recent Inpatient/ED visit.  Follow Up Plan: Additional outreach attempts will be made to reach the patient to complete the Transitions of Care (Post Inpatient/ED visit) call.   Channing Larry, RN, BA, [facility], CRRN [facility] Coordinator, Transition of Care Ph # [phone]

## 2023-03-13 ENCOUNTER — Telehealth: Payer: Self-pay

## 2023-03-13 NOTE — Transitions of Care (Post Inpatient/ED Visit) (Signed)
   03/13/2023  Name: Jaliyah Fotheringham MRN: [id number] DOB: Jun 07, 1960  Today's TOC FU Call Status: Today's TOC FU Call Status:: Unsuccessful Call (3rd Attempt) Unsuccessful Call (3rd Attempt) Date: 03/13/23  Attempted to reach the patient regarding the most recent Inpatient/ED visit.  Follow Up Plan: No further outreach attempts will be made at this time. We have been unable to contact the patient.  [facility] TOC post discharge outreach attempt #3 today unsuccessful. Unsuccessful outreach attempt #1 and #2 on 03/12/23. Was contacted by PCP Practice TOC succesfully on 03/10/33. No answer this attempt and unable to leave a voice message.   Bing Edison MSN, RN RN [employer] [facility]  [facility] Office Hours Wed/Thur  8:00 am-6:00 pm Direct Dial: [phone] Main Phone [phone]  Fax: [phone] [email or web address]

## 2023-03-14 NOTE — Telephone Encounter (Signed)
 Copied from CRM [id number]. Topic: Appointments - Scheduling Inquiry for Clinic >> Mar 14, 2023 12:45 PM Delon DASEN wrote: Reason for CRM: Patient is wanting a sooner appointment or possibly a televisit for her current appointment on March 11, please call [phone]   Spoke with patient. Patient was trying to get in contact with vascular office instead of PCP office. Vascular and Vein information Given

## 2023-03-20 ENCOUNTER — Telehealth: Payer: Self-pay

## 2023-03-20 NOTE — Telephone Encounter (Signed)
Triage:  -pt walk in at 0720 am with c/o blisters, red, swollen -on chart review, pt is s/p R ex-iliac artery angioplasty w/stent on 03/07/23 w/Dr. Chestine Spore -returned call to pt who states her leg is very painful, it's dark red in color, swollen, and has blisters. That its painful all the time walking, standing and even when she is resting.  She feels like it's a little better but it is hurting too much.  Pt is requesting more pain medicine because when she left the hospital, they gave her 5 tablets and thought they helped a whole lot.   -difficult for pt to articulate all the symptoms.   Will evaluate in office.  Aptt booked

## 2023-03-21 ENCOUNTER — Ambulatory Visit (INDEPENDENT_AMBULATORY_CARE_PROVIDER_SITE_OTHER): Payer: 59

## 2023-03-21 VITALS — BP 115/56 | HR 77 | Temp 98.1°F | Ht 65.0 in | Wt 140.0 lb

## 2023-03-21 DIAGNOSIS — I70238 Atherosclerosis of native arteries of right leg with ulceration of other part of lower right leg: Secondary | ICD-10-CM

## 2023-03-21 DIAGNOSIS — I96 Gangrene, not elsewhere classified: Secondary | ICD-10-CM | POA: Diagnosis not present

## 2023-03-21 MED ORDER — OXYCODONE-ACETAMINOPHEN 5-325 MG PO TABS: 1.0000 | ORAL_TABLET | Freq: Four times a day (QID) | ORAL | 0 refills | Status: DC | PRN

## 2023-03-21 NOTE — Progress Notes (Signed)
  POST OPERATIVE OFFICE NOTE    CC:  F/u for surgery  HPI:  This is a 63 y.o. female who is s/p right External iliac angioplasty and stent placement on 03/07/23 by DR. Clark.  She presented with rest pain in the right foot due to critical limb ischemia with rest pain in the right lower extremity. She had no right femoral pulse.  She had no tissue loss or changes.   Over the last 3 days she has developed ischemic changes with dry gangrene/blisters on the dorsum of the 1-3 toes. She is in a lot of pain.  She is medically managed on Plavix, Eliquies and Statin.  She is very concerned and tearful.  Thankfully she has a friend with her.    No Known Allergies  Current Outpatient Medications  Medication Sig Dispense Refill   oxycodone (OXY-IR) 5 MG capsule Take 5 mg by mouth every 4 (four) hours as needed for pain.     atorvastatin (LIPITOR) 40 MG tablet Take 1 tablet (40 mg total) by mouth daily. 30 tablet 0   carvedilol (COREG) 3.125 MG tablet Take 3.125 mg by mouth 2 (two) times daily.     clopidogrel (PLAVIX) 75 MG tablet Take 1 tablet (75 mg total) by mouth daily. 30 tablet 0   dapagliflozin propanediol (FARXIGA) 10 MG TABS tablet TAKE 1 TABLET BY MOUTH DAILY 90 tablet 1   dofetilide (TIKOSYN) 125 MCG capsule Take 1 capsule (125 mcg total) by mouth 2 (two) times daily. 60 capsule 3   ELIQUIS 5 MG TABS tablet TAKE 1 TABLET BY MOUTH TWICE  DAILY 200 tablet 2   feeding supplement (ENSURE IMMUNE HEALTH) LIQD Take 237 mLs by mouth 3 (three) times daily with meals.     furosemide (LASIX) 40 MG tablet Take 0.5 tablets (20 mg total) by mouth daily. 45 tablet 1   methimazole (TAPAZOLE) 5 MG tablet Take 1 tablet (5 mg total) by mouth daily. 90 tablet 3   potassium chloride (KLOR-CON) 10 MEQ tablet TAKE 1 TABLET(10 MEQ) BY MOUTH DAILY 90 tablet 3   spironolactone (ALDACTONE) 25 MG tablet TAKE 1/2 TABLET(12.5 MG) BY MOUTH DAILY 45 tablet 3   No current facility-administered medications for this visit.      ROS:  See HPI  Physical Exam:    Incision:  left groin site clean and dry without any sign of hematoma or wound. Extremities:  Doppler signal Peroneal/PT at the ankle, weak palpable femoral pulse on the right, left  Neuro: pain with decreased sensation  Lungs non labored breathing Heart RRR    Assessment/Plan:  This is a 63 y.o. female who is s/p:right EIA stent/angioplasty for rest pain   She has had pain in the right foot prior to and after the procedure.  She states she has had blister and darkening of the toes 1-3 over the past 3 days.  Her pain is sever.   Dr. Karin Lieu saw her in conjunction with me today.  We have planned repeat angiogram with right LE runoff and possible intervention to improve the inflow to her foot.  If her pain gets intolerable before the angiogram she will need to report to [facility] ED.  I did give her percocet 1 q 6 PRN # 30.     Mosetta Pigeon PA-C [facility] [phone]   Clinic MD:  Karin Lieu

## 2023-03-25 ENCOUNTER — Other Ambulatory Visit: Payer: Self-pay | Admitting: Cardiovascular Disease

## 2023-03-25 DIAGNOSIS — I4819 Other persistent atrial fibrillation: Secondary | ICD-10-CM

## 2023-03-26 ENCOUNTER — Inpatient Hospital Stay (HOSPITAL_COMMUNITY): Admission: RE | Admit: 2023-03-26 | Payer: 59 | Source: Ambulatory Visit | Admitting: Internal Medicine

## 2023-03-26 NOTE — Telephone Encounter (Signed)
 Prescription refill request for Eliquis received. Indication:afib Last office visit:10/24 Scr:0.83  2/25 Age: 63 Weight:63.5  kg  Prescription refilled

## 2023-03-27 ENCOUNTER — Encounter (HOSPITAL_COMMUNITY)
Admission: RE | Disposition: A | Discharge status: Home / Self Care | Payer: Self-pay | Source: Home / Self Care | Attending: Vascular Surgery

## 2023-03-27 ENCOUNTER — Other Ambulatory Visit: Payer: Self-pay

## 2023-03-27 ENCOUNTER — Ambulatory Visit (HOSPITAL_COMMUNITY)
Admission: RE | Admit: 2023-03-27 | Discharge: 2023-03-27 | Disposition: A | Discharge status: Home / Self Care | Payer: 59 | Attending: Vascular Surgery | Admitting: Vascular Surgery

## 2023-03-27 DIAGNOSIS — I70261 Atherosclerosis of native arteries of extremities with gangrene, right leg: Secondary | ICD-10-CM | POA: Diagnosis not present

## 2023-03-27 DIAGNOSIS — Z79899 Other long term (current) drug therapy: Secondary | ICD-10-CM | POA: Diagnosis not present

## 2023-03-27 DIAGNOSIS — I75021 Atheroembolism of right lower extremity: Secondary | ICD-10-CM | POA: Insufficient documentation

## 2023-03-27 DIAGNOSIS — I739 Peripheral vascular disease, unspecified: Secondary | ICD-10-CM

## 2023-03-27 DIAGNOSIS — Z7902 Long term (current) use of antithrombotics/antiplatelets: Secondary | ICD-10-CM | POA: Diagnosis not present

## 2023-03-27 DIAGNOSIS — Z7901 Long term (current) use of anticoagulants: Secondary | ICD-10-CM | POA: Diagnosis not present

## 2023-03-27 HISTORY — PX: ABDOMINAL AORTOGRAM W/LOWER EXTREMITY: CATH118223

## 2023-03-27 HISTORY — PX: PERIPHERAL VASCULAR BALLOON ANGIOPLASTY: CATH118281

## 2023-03-27 LAB — POCT I-STAT, CHEM 8
BUN: 35 mg/dL — ABNORMAL HIGH (ref 8–23)
BUN: 32 mg/dL — ABNORMAL HIGH (ref 8–23)
Calcium, Ion: 1.22 mmol/L (ref 1.15–1.40)
Calcium, Ion: 1.11 mmol/L — ABNORMAL LOW (ref 1.15–1.40)
Chloride: 104 mmol/L (ref 98–111)
Chloride: 106 mmol/L (ref 98–111)
Creatinine, Ser: 1.1 mg/dL — ABNORMAL HIGH (ref 0.44–1.00)
Creatinine, Ser: 1 mg/dL (ref 0.44–1.00)
Glucose, Bld: 83 mg/dL (ref 70–99)
Glucose, Bld: 89 mg/dL (ref 70–99)
HCT: 37 % (ref 36.0–46.0)
HCT: 34 % — ABNORMAL LOW (ref 36.0–46.0)
Hemoglobin: 12.6 g/dL (ref 12.0–15.0)
Hemoglobin: 11.6 g/dL — ABNORMAL LOW (ref 12.0–15.0)
Potassium: 4.1 mmol/L (ref 3.5–5.1)
Potassium: 4 mmol/L (ref 3.5–5.1)
Sodium: 140 mmol/L (ref 135–145)
Sodium: 138 mmol/L (ref 135–145)
TCO2: 26 mmol/L (ref 22–32)
TCO2: 24 mmol/L (ref 22–32)

## 2023-03-27 LAB — POCT ACTIVATED CLOTTING TIME: Activated Clotting Time: 181 s

## 2023-03-27 SURGERY — ABDOMINAL AORTOGRAM W/LOWER EXTREMITY
Anesthesia: LOCAL

## 2023-03-27 MED ORDER — OXYCODONE HCL 5 MG PO TABS
5.0000 mg | ORAL_TABLET | Freq: Once | ORAL | Status: AC
  Administered 2023-03-27: 5 mg via ORAL

## 2023-03-27 MED ORDER — SODIUM CHLORIDE 0.9% FLUSH: 3.0000 mL | INTRAVENOUS | Status: DC | PRN

## 2023-03-27 MED ORDER — LIDOCAINE HCL (PF) 1 % IJ SOLN
INTRAMUSCULAR | Status: DC | PRN
  Administered 2023-03-27: 10 mL

## 2023-03-27 MED ORDER — SODIUM CHLORIDE 0.9 % WEIGHT BASED INFUSION
1.0000 mL/kg/h | INTRAVENOUS | Status: DC
  Administered 2023-03-27: 1 mL/kg/h via INTRAVENOUS

## 2023-03-27 MED ORDER — LIDOCAINE HCL (PF) 1 % IJ SOLN
INTRAMUSCULAR | Status: AC
  Filled 2023-03-27: qty 30

## 2023-03-27 MED ORDER — IODIXANOL 320 MG/ML IV SOLN
INTRAVENOUS | Status: DC | PRN
  Administered 2023-03-27: 100 mL

## 2023-03-27 MED ORDER — ONDANSETRON HCL 4 MG/2ML IJ SOLN: 4.0000 mg | Freq: Four times a day (QID) | INTRAMUSCULAR | Status: DC | PRN

## 2023-03-27 MED ORDER — HEPARIN SODIUM (PORCINE) 1000 UNIT/ML IJ SOLN
INTRAMUSCULAR | Status: DC | PRN
  Administered 2023-03-27: 5000 [IU] via INTRAVENOUS
  Administered 2023-03-27: 1000 [IU] via INTRAVENOUS

## 2023-03-27 MED ORDER — SODIUM CHLORIDE 0.9 % IV SOLN: 250.0000 mL | INTRAVENOUS | Status: DC | PRN

## 2023-03-27 MED ORDER — SODIUM CHLORIDE 0.9 % IV SOLN: INTRAVENOUS | Status: DC

## 2023-03-27 MED ORDER — LABETALOL HCL 5 MG/ML IV SOLN: 10.0000 mg | INTRAVENOUS | Status: DC | PRN

## 2023-03-27 MED ORDER — ACETAMINOPHEN 325 MG PO TABS: 650.0000 mg | ORAL_TABLET | ORAL | Status: DC | PRN

## 2023-03-27 MED ORDER — HEPARIN SODIUM (PORCINE) 1000 UNIT/ML IJ SOLN
INTRAMUSCULAR | Status: AC
  Filled 2023-03-27: qty 10

## 2023-03-27 MED ORDER — HEPARIN (PORCINE) IN NACL 1000-0.9 UT/500ML-% IV SOLN
INTRAVENOUS | Status: DC | PRN
Start: 2023-03-27 — End: 2023-03-27
  Administered 2023-03-27 (×2): 500 mL

## 2023-03-27 MED ORDER — SODIUM CHLORIDE 0.9% FLUSH
3.0000 mL | Freq: Two times a day (BID) | INTRAVENOUS | Status: DC
Start: 2023-03-28 — End: 2023-03-27

## 2023-03-27 MED ORDER — OXYCODONE HCL 5 MG PO TABS
ORAL_TABLET | ORAL | Status: AC
  Filled 2023-03-27: qty 1

## 2023-03-27 MED ORDER — HYDRALAZINE HCL 20 MG/ML IJ SOLN: 5.0000 mg | INTRAMUSCULAR | Status: DC | PRN

## 2023-03-27 SURGICAL SUPPLY — 19 items
BAG SNAP BAND KOVER 36X36 (MISCELLANEOUS) IMPLANT
BALLN STERLI SL OTW 2.5X80X150 (BALLOONS) ×2 IMPLANT
BALLOON STRLNG OTW 2.5X80X150 (BALLOONS) IMPLANT
CATH CXI 2.3F 150 ANG 2 (CATHETERS) IMPLANT
CATH OMNI FLUSH 5F 65CM (CATHETERS) IMPLANT
CATH RUBICON 018 135 (CATHETERS) IMPLANT
COVER DOME SNAP 22 D (MISCELLANEOUS) IMPLANT
DEVICE CLOSURE MYNXGRIP 6/7F (Vascular Products) IMPLANT
KIT ENCORE 26 ADVANTAGE (KITS) IMPLANT
KIT MICROPUNCTURE NIT STIFF (SHEATH) IMPLANT
SET ATX-X65L (MISCELLANEOUS) IMPLANT
SHEATH CATAPULT 6FR 60 (SHEATH) IMPLANT
SHEATH PINNACLE 5F 10CM (SHEATH) IMPLANT
SHEATH PINNACLE 6F 10CM (SHEATH) IMPLANT
SHEATH PROBE COVER 6X72 (BAG) IMPLANT
TRAY PV CATH (CUSTOM PROCEDURE TRAY) ×3 IMPLANT
WIRE BENTSON .035X145CM (WIRE) IMPLANT
WIRE G V18X300CM (WIRE) IMPLANT
WIRE SHEPHERD 4G .018 (WIRE) IMPLANT

## 2023-03-27 NOTE — Progress Notes (Signed)
 Spoke with Dr. Karin Lieu, ok to remove left groin sheath, ACT at 181

## 2023-03-27 NOTE — Op Note (Addendum)
 Patient name: Joanna [name] MRN: [id number] DOB: [birth year] Sex: female  03/27/2023 Pre-operative Diagnosis: Right lower extremity blue toe syndrome Post-operative diagnosis:  Same Surgeon:  Victorino Sparrow, MD Procedure Performed: 1.  Ultrasound-guided micropuncture access of the left common femoral artery in retrograde fashion 2.  Aortogram 3.  Second order cannulation, right lower extremity angiogram 4.  Third order cannulation, right lower extremity angiogram 5.  Balloon angioplasty 2.5 x 80 mm tibioperoneal trunk, posterior tibial artery 6.  Contrast volume 100 mL  Indications: Patient is a 63 year old female with known peripheral arterial disease who recently underwent right sided external iliac artery stenting for critical limb ischemia with rest pain.  She was seen in the office with new onset blistering and dry gangrene of the first and second toes concerning for blue toe syndrome.  After discussing the risks and benefits of repeat angiography to ensure that there is no culprit lesion that needs to be stented, Joanna [name] elected to proceed.  Current medication regimen includes Plavix, Eliquis, statin.  Findings:  Aortogram: Bilateral renal arteries patent, no flow-limiting stenosis appreciated in the aortoiliac segments bilaterally.  On the right: Widely patent external neck artery stent, common femoral artery, profunda, superficial femoral artery, popliteal artery.  Greater than 90% stenosis at the ostia of the posterior tibial artery.  Three-vessel runoff to the level of the ankle with the posterior tibial artery continuing into the foot as plantar arteries, and the anterior tibial artery continuing to the foot as the dorsalis pedis.  There is significant small vessel disease in the foot appreciated.  No pedal arch.   Procedure:  The patient was identified in the holding area and taken to room 8.  The patient was then placed supine on the table and prepped and draped in the usual  sterile fashion.  A time out was called.  Ultrasound was used to evaluate the left common femoral artery.  It was patent .  A digital ultrasound image was acquired.  A micropuncture needle was used to access the left common femoral artery under ultrasound guidance.  An 018 wire was advanced without resistance and a micropuncture sheath was placed.  The 018 wire was removed and a benson wire was placed.  The micropuncture sheath was exchanged for a 5 french sheath.  An omniflush catheter was advanced over the wire to the level of L-1.  An abdominal angiogram was obtained.  Next, using the omniflush catheter and a benson wire, the aortic bifurcation was crossed and the catheter was placed into theright external iliac artery and right runoff was obtained.    I elected to attempt intervention on the right posterior tibial artery, as this vessel was sizable, and had 90% stenosis at its ostia.  I was worried that without treatment, [name] would have insufficient blood flow to heal her toes that may need amputation due to the significant amount of small vessel disease.  A 6 x 60 cm sheath was brought to the field and parked in the superficial femoral artery.  Angiography followed from the superficial femoral artery.  The patient was heparinized, and a series of 0.014 wires and catheters were used to cannulate the posterior tibial artery.  Next, a 2 x 80 mm balloon was brought to the field and inflated for 2 and half minutes from the tibioperoneal trunk into the proximal portion of the posterior tibial artery.  Follow-up angiography demonstrated excellent result with resolution of stenosis.  While three-vessel runoff was appreciated, the posterior tibial artery  provided brisk, dominant runoff to the foot at case completion.  Pressure was held for access management.  Impression: No nidus lesion appreciated for blue toe syndrome.   Successful balloon angioplasty of 90% stenotic lesion at the ostia of the posterior  tibial artery. Patient would be best served with continued observation of the toes.  She is aware there is a high likelihood she may require amputation in the future if the toes do not improve.  Patient currently on optimal medical therapy for either atheroembolic or cardioembolic event. The lesion could have been sequela from prolonged ischemia with known small vessel disease, however acuity the event still favors blue toe syndrome.    Victorino Sparrow MD [facility] [place] Office: [phone]

## 2023-03-27 NOTE — Progress Notes (Addendum)
 SITE AREA: left groin/femoral  SITE PRIOR TO REMOVAL:  LEVEL 0  PRESSURE APPLIED FOR: approximately 30 minutes  MANUAL: yes  PATIENT STATUS DURING PULL: stable, but in pain, see MAR  POST PULL SITE:  LEVEL 0  POST PULL INSTRUCTIONS GIVEN: yes, drsg to groin x 24 hours, no running or jumping, climb easy on stairs or into and out of vehicles, no sitting in water x 1 week, no hot tubs, bath tubs, or pools, may shower tomorrow  POST PULL PULSES PRESENT: right popliteal pulse at +1, left pedal pulse dopplerable, pt with increased sensitivity to right foot area  DRESSING APPLIED: gauze with tegaderm  BEDREST BEGINS @ 1408  COMMENTS: pt reminded often to keep head on pillow and lle straight, and do not shift/wiggle hips to much

## 2023-03-27 NOTE — H&P (Signed)
  POST OPERATIVE OFFICE NOTE    Patient seen and examined in preop holding.  No complaints. No changes to medication history or physical exam since last seen in clinic. After discussing the risks and benefits of right leg angiogram for blue toe syndrome, Joanna [name] elected to proceed.   Joanna Sparrow MD    CC:  F/u for surgery  HPI:  This is a 63 y.o. female who is s/p right External iliac angioplasty and stent placement on 03/07/23 by DR. Clark.  She presented with rest pain in the right foot due to critical limb ischemia with rest pain in the right lower extremity. She had no right femoral pulse.  She had no tissue loss or changes.   Over the last 3 days she has developed ischemic changes with dry gangrene/blisters on the dorsum of the 1-3 toes. She is in a lot of pain.  She is medically managed on Plavix, Eliquies and Statin.  She is very concerned and tearful.  Thankfully she has a friend with her.    No Known Allergies  Current Facility-Administered Medications  Medication Dose Route Frequency Provider Last Rate Last Admin   0.9 %  sodium chloride infusion   Intravenous Continuous Joanna Sparrow, MD       Heparin (Porcine) in NaCl 1000-0.9 UT/500ML-% SOLN    PRN Joanna Sparrow, MD   500 mL at 03/27/23 1155   heparin sodium (porcine) injection    PRN Joanna Sparrow, MD   1,000 Units at 03/27/23 1213   iodixanol (VISIPAQUE) 320 MG/ML injection    PRN Joanna Sparrow, MD   100 mL at 03/27/23 1246   lidocaine (PF) (XYLOCAINE) 1 % injection    PRN Joanna Sparrow, MD   10 mL at 03/27/23 1157     ROS:  See HPI  Physical Exam:    Incision:  left groin site clean and dry without any sign of hematoma or wound. Extremities:  Doppler signal Peroneal/PT at the ankle, weak palpable femoral pulse on the right, left  Neuro: pain with decreased sensation  Lungs non labored breathing Heart RRR    Assessment/Plan:  This is a 63 y.o. female who is s/p:right EIA  stent/angioplasty for rest pain   She has had pain in the right foot prior to and after the procedure.  She states she has had blister and darkening of the toes 1-3 over the past 3 days.  Her pain is sever.   Dr. Karin Lieu saw her in conjunction with me today.  We have planned repeat angiogram with right LE runoff and possible intervention to improve the inflow to her foot.  If her pain gets intolerable before the angiogram she will need to report to [facility] ED.  I did give her percocet 1 q 6 PRN # 30.      [facility] [phone]   Clinic MD:  Karin Lieu

## 2023-03-27 NOTE — Discharge Instructions (Signed)
 Femoral Site Care This sheet gives you information about how to care for yourself after your procedure. Your health care provider may also give you more specific instructions. If you have problems or questions, contact your health care provider. What can I expect after the procedure?  After the procedure, it is common to have: Bruising that usually fades within 1-2 weeks. Tenderness at the site. Follow these instructions at home: Wound care Follow instructions from your health care provider about how to take care of your insertion site. Make sure you: Wash your hands with soap and water before you change your bandage (dressing). If soap and water are not available, use hand sanitizer. Remove your dressing as told by your health care provider. In 24 hours Do not take baths, swim, or use a hot tub until your health care provider approves. You may shower 24-48 hours after the procedure or as told by your health care provider. Gently wash the site with plain soap and water. Pat the area dry with a clean towel. Do not rub the site. This may cause bleeding. Do not apply powder or lotion to the site. Keep the site clean and dry. Check your femoral site every day for signs of infection. Check for: Redness, swelling, or pain. Fluid or blood. Warmth. Pus or a bad smell. Activity For the first 2-3 days after your procedure, or as long as directed: Avoid climbing stairs as much as possible. Do not squat. Do not lift anything that is heavier than 10 lb (4.5 kg), or the limit that you are told, until your health care provider says that it is safe. For 5 days Rest as directed. Avoid sitting for a long time without moving. Get up to take short walks every 1-2 hours. Do not drive for 24 hours if you were given a medicine to help you relax (sedative). General instructions Take over-the-counter and prescription medicines only as told by your health care provider. Keep all follow-up visits as told by  your health care provider. This is important. Contact a health care provider if you have: A fever or chills. You have redness, swelling, or pain around your insertion site. Get help right away if: The catheter insertion area swells very fast. You pass out. You suddenly start to sweat or your skin gets clammy. The catheter insertion area is bleeding, and the bleeding does not stop when you hold steady pressure on the area. The area near or just beyond the catheter insertion site becomes pale, cool, tingly, or numb. These symptoms may represent a serious problem that is an emergency. Do not wait to see if the symptoms will go away. Get medical help right away. Call your local emergency services (911 in the [place] Do not drive yourself to the hospital. Summary After the procedure, it is common to have bruising that usually fades within 1-2 weeks. Check your femoral site every day for signs of infection. Do not lift anything that is heavier than 10 lb (4.5 kg), or the limit that you are told, until your health care provider says that it is safe. This information is not intended to replace advice given to you by your health care provider. Make sure you discuss any questions you have with your health care provider. Document Revised: 02/04/2017 Document Reviewed: 02/04/2017 Elsevier Patient Education  2020 [employer].

## 2023-03-27 NOTE — H&P (Signed)
  POST OPERATIVE OFFICE NOTE    Patient seen and examined in preop holding.  No complaints. No changes to medication history or physical exam since last seen in clinic. After discussing the risks and benefits of right leg angiogram for blue toe syndrome, Joanna [name] elected to proceed.   Victorino Sparrow MD    CC:  F/u for surgery  HPI:  This is a 63 y.o. female who is s/p right External iliac angioplasty and stent placement on 03/07/23 by DR. Clark.  She presented with rest pain in the right foot due to critical limb ischemia with rest pain in the right lower extremity. She had no right femoral pulse.  She had no tissue loss or changes.   Over the last 3 days she has developed ischemic changes with dry gangrene/blisters on the dorsum of the 1-3 toes. She is in a lot of pain.  She is medically managed on Plavix, Eliquies and Statin.  She is very concerned and tearful.  Thankfully she has a friend with her.    No Known Allergies  Current Facility-Administered Medications  Medication Dose Route Frequency Provider Last Rate Last Admin   0.9 %  sodium chloride infusion   Intravenous Continuous Victorino Sparrow, MD         ROS:  See HPI  Physical Exam:    Incision:  left groin site clean and dry without any sign of hematoma or wound. Extremities:  Doppler signal Peroneal/PT at the ankle, weak palpable femoral pulse on the right, left  Neuro: pain with decreased sensation  Lungs non labored breathing Heart RRR    Assessment/Plan:  This is a 63 y.o. female who is s/p:right EIA stent/angioplasty for rest pain   She has had pain in the right foot prior to and after the procedure.  She states she has had blister and darkening of the toes 1-3 over the past 3 days.  Her pain is sever.   Dr. Karin Lieu saw her in conjunction with me today.  We have planned repeat angiogram with right LE runoff and possible intervention to improve the inflow to her foot.  If her pain gets intolerable before  the angiogram she will need to report to [facility] ED.  I did give her percocet 1 q 6 PRN # 30.      [facility] [phone]   Clinic MD:  Karin Lieu

## 2023-03-28 ENCOUNTER — Encounter (HOSPITAL_COMMUNITY): Payer: Self-pay | Admitting: Vascular Surgery

## 2023-03-29 ENCOUNTER — Telehealth: Payer: Self-pay

## 2023-03-29 NOTE — Telephone Encounter (Signed)
 Advice:  -pt called to see if she can put ointment on her foot where the blisters were stating the skin is peeling an but not draining. -advised pt to cleanse foot with antibacterial soap and water, cover with a dry dressing and she can keep open to air at night.

## 2023-04-08 ENCOUNTER — Other Ambulatory Visit: Payer: Self-pay

## 2023-04-08 MED ORDER — ATORVASTATIN CALCIUM 40 MG PO TABS
40.0000 mg | ORAL_TABLET | Freq: Every day | ORAL | 0 refills | Status: DC
Start: 2023-04-08 — End: 2023-05-06

## 2023-04-08 MED ORDER — CLOPIDOGREL BISULFATE 75 MG PO TABS: 75.0000 mg | ORAL_TABLET | Freq: Every day | ORAL | 4 refills | Status: DC

## 2023-04-11 ENCOUNTER — Other Ambulatory Visit: Payer: Self-pay

## 2023-04-11 DIAGNOSIS — I70238 Atherosclerosis of native arteries of right leg with ulceration of other part of lower right leg: Secondary | ICD-10-CM

## 2023-04-16 ENCOUNTER — Inpatient Hospital Stay: Payer: 59 | Admitting: Internal Medicine

## 2023-04-16 ENCOUNTER — Telehealth: Payer: Self-pay

## 2023-04-16 NOTE — Telephone Encounter (Signed)
 Advice only:  -pt LM on triage line stating she had a question about her foot and her appt. -returned call and pt states her toe looks a little gray now, that its not cold all the time.  She also wanted to inform us that she might not be able to make her appt on the 3/18 bc her sister might need surgery.   -advised pt to call us by Friday if she had to cancel the appt.

## 2023-04-17 ENCOUNTER — Encounter: Payer: Self-pay | Admitting: Podiatry

## 2023-04-17 ENCOUNTER — Ambulatory Visit (HOSPITAL_COMMUNITY)
Admission: RE | Admit: 2023-04-17 | Discharge: 2023-04-17 | Disposition: A | Discharge status: Home / Self Care | Payer: 59 | Source: Ambulatory Visit | Attending: Vascular Surgery | Admitting: Vascular Surgery

## 2023-04-17 ENCOUNTER — Ambulatory Visit (INDEPENDENT_AMBULATORY_CARE_PROVIDER_SITE_OTHER): Admitting: Podiatry

## 2023-04-17 ENCOUNTER — Ambulatory Visit (INDEPENDENT_AMBULATORY_CARE_PROVIDER_SITE_OTHER): Admitting: Physician Assistant

## 2023-04-17 ENCOUNTER — Ambulatory Visit (INDEPENDENT_AMBULATORY_CARE_PROVIDER_SITE_OTHER)
Admission: RE | Admit: 2023-04-17 | Discharge: 2023-04-17 | Disposition: A | Discharge status: Home / Self Care | Payer: 59 | Source: Ambulatory Visit | Attending: Vascular Surgery | Admitting: Vascular Surgery

## 2023-04-17 ENCOUNTER — Encounter (HOSPITAL_COMMUNITY): Payer: 59

## 2023-04-17 ENCOUNTER — Encounter: Payer: 59 | Admitting: Vascular Surgery

## 2023-04-17 ENCOUNTER — Telehealth: Payer: Self-pay

## 2023-04-17 VITALS — BP 124/76 | HR 66 | Temp 97.2°F | Ht 65.0 in

## 2023-04-17 DIAGNOSIS — M79674 Pain in right toe(s): Secondary | ICD-10-CM | POA: Diagnosis not present

## 2023-04-17 DIAGNOSIS — M79675 Pain in left toe(s): Secondary | ICD-10-CM | POA: Diagnosis not present

## 2023-04-17 DIAGNOSIS — I70223 Atherosclerosis of native arteries of extremities with rest pain, bilateral legs: Secondary | ICD-10-CM

## 2023-04-17 DIAGNOSIS — I70238 Atherosclerosis of native arteries of right leg with ulceration of other part of lower right leg: Secondary | ICD-10-CM

## 2023-04-17 DIAGNOSIS — I96 Gangrene, not elsewhere classified: Secondary | ICD-10-CM

## 2023-04-17 DIAGNOSIS — B351 Tinea unguium: Secondary | ICD-10-CM | POA: Diagnosis not present

## 2023-04-17 LAB — VAS US ABI WITH/WO TBI
Left ABI: 1.15
Right ABI: 1.23

## 2023-04-17 MED ORDER — OXYCODONE-ACETAMINOPHEN 5-325 MG PO TABS: 1.0000 | ORAL_TABLET | Freq: Four times a day (QID) | ORAL | 0 refills | Status: DC | PRN

## 2023-04-17 MED ORDER — DOXYCYCLINE HYCLATE 100 MG PO CAPS: 100.0000 mg | ORAL_CAPSULE | Freq: Two times a day (BID) | ORAL | 0 refills | Status: DC

## 2023-04-17 NOTE — Telephone Encounter (Signed)
 Triage:  -pt had f/u studies done this am in our office.  Reported pt's foot presents as possible "wet gangrene"   -immediately triaged to provider visit

## 2023-04-17 NOTE — Progress Notes (Signed)
 Chief Complaint  Patient presents with   Foot Pain    RM#1 Patient presents today with toes on right foot needing evaluation for gangrene.Patient states had surgery and now toes appear to be changing color.     HPI: 63 y.o. female presenting today new patient stat referral from vascular for evaluation of ischemic gangrene to the toes RLE.  Seen earlier today by vascular and referred to podiatry for stat evaluation and possible surgical amputation versus autoamputation of the toes.  Vascular HPI:  hx of angiogram with balloon angioplasty of the right TPT, PTA on 03/27/2023 by Dr. Karin Lieu for blue toe syndrome.  She underwent angiogram with right EIA angioplasty and stent on 03/07/2023 for CLI with rest pain by Dr. Chestine Spore.   Past Medical History:  Diagnosis Date   Atrial fibrillation with RVR (HCC)    Chronic combined systolic (congestive) and diastolic (congestive) heart failure (HCC)    Dyspnea    Goiter    Hypertension    NICM (nonischemic cardiomyopathy) (HCC)    a. 08/2005 Echo: EF 30-35%, mod diff HK. Mild to Mod MR. Mildly dil LA; b. 08/2005 Cath: Nl Cors. Elevated CO w/o shunt; c. 09/2007 Echo: EF 45%. Mild to mod MR; c. 03/2015 Echo: EF 45%, global HK. Gr1 DD. Mild MR. Mildly dil LA.    Past Surgical History:  Procedure Laterality Date   ABDOMINAL AORTOGRAM W/LOWER EXTREMITY N/A 03/07/2023   Procedure: ABDOMINAL AORTOGRAM W/LOWER EXTREMITY;  Surgeon: Cephus Shelling, MD;  Location: [facility] INVASIVE CV LAB;  Service: Cardiovascular;  Laterality: N/A;   ABDOMINAL AORTOGRAM W/LOWER EXTREMITY N/A 03/27/2023   Procedure: ABDOMINAL AORTOGRAM W/LOWER EXTREMITY;  Surgeon: Victorino Sparrow, MD;  Location: [facility] INVASIVE CV LAB;  Service: Cardiovascular;  Laterality: N/A;   APPENDECTOMY     BACK SURGERY     CARDIOVERSION N/A 03/14/2018   Procedure: CARDIOVERSION;  Surgeon: Pricilla Riffle, MD;  Location[facility] ENDOSCOPY;  Service: Cardiovascular;  Laterality: N/A;   PERIPHERAL VASCULAR BALLOON  ANGIOPLASTY  03/27/2023   Procedure: PERIPHERAL VASCULAR BALLOON ANGIOPLASTY;  Surgeon: Victorino Sparrow, MD;  Location: [facility] INVASIVE CV LAB;  Service: Cardiovascular;;  Right AT   TEE WITHOUT CARDIOVERSION N/A 03/14/2018   Procedure: TRANSESOPHAGEAL ECHOCARDIOGRAM (TEE);  Surgeon: Pricilla Riffle, MD;  Location: [facility] ENDOSCOPY;  Service: Cardiovascular;  Laterality: N/A;   TEE WITHOUT CARDIOVERSION N/A 02/26/2022   Procedure: TRANSESOPHAGEAL ECHOCARDIOGRAM (TEE);  Surgeon: Dorthula Nettles, DO;  Location: [facility] ENDOSCOPY;  Service: Cardiovascular;  Laterality: N/A;    No Known Allergies     312 2025   Physical Exam: General: The patient is alert and oriented x3 in no acute distress.  Dermatology: Currently the ischemic toes appear very dry and stable.  No malodor.  Clinically there is no concern for infection at the moment.  Her toenails are elongated dystrophic  Vascular: hx of angiogram with balloon angioplasty of the right TPT, PTA on 03/27/2023 by Dr. Karin Lieu for blue toe syndrome.  She underwent angiogram with right EIA angioplasty and stent on 03/07/2023 for CLI with rest pain by Dr. Chestine Spore.   Neurological: Diminished via light touch  Musculoskeletal Exam: No pedal deformity.  No prior amputation.  Minimally ambulatory secondary to pain  Assessment/Plan of Care: 1.  Critical limb ischemia  2.  Dry stable gangrene digits 1-3 right foot 3.  Pain due to onychomycosis of toenails bilateral  -Patient evaluated.  Labs reviewed -After discussing with the patient and evaluating the patient I do believe that  autoamputation would be a viable option.  Surgical amputation may create more complications if there is inability to heal the amputation site.  Currently the toes are very dry and stable -Patient and sister both agree with the plan moving forward to observe routinely and allow the toes to auto amputate -Recommend Betadine daily to the toes.  Betadine provided -Mechanical debridement of the  nails 1-5 bilateral was also performed using a nail nipper without incident -Return to clinic 2-3 weeks for follow-up and close observation       Felecia Shelling, DPM [facility]  Dr. Felecia Shelling, DPM    [address] [place], [place] [address]                Office [phone]  Fax [phone]

## 2023-04-17 NOTE — Progress Notes (Addendum)
 HISTORY AND PHYSICAL     CC:  follow up. Requesting Provider:  Marcine Matar, MD  HPI: This is a 63 y.o. female who is here today for follow up for PAD.  Pt has hx of angiogram with balloon angioplasty of the right TPT, PTA on 03/27/2023 by Dr. Karin Lieu for blue toe syndrome.  She underwent angiogram with right EIA angioplasty and stent on 03/07/2023 for CLI with rest pain by Dr. Chestine Spore.  The pt returns today for follow up and here with her sister.   She states that her toes have gotten worse.  She does have some pain in the toes.  She has not had any fevers.  There is no drainage around the wounds.   She does not have a podiatrist.    The pt is on a statin for cholesterol management.    The pt is not on an aspirin.    Other AC:  Plavix/Eliquis The pt is on diuretic for hypertension.  The pt is not on medication for diabetes. Tobacco hx:  current    Past Medical History:  Diagnosis Date   Atrial fibrillation with RVR (HCC)    Chronic combined systolic (congestive) and diastolic (congestive) heart failure (HCC)    Dyspnea    Goiter    Hypertension    NICM (nonischemic cardiomyopathy) (HCC)    a. 08/2005 Echo: EF 30-35%, mod diff HK. Mild to Mod MR. Mildly dil LA; b. 08/2005 Cath: Nl Cors. Elevated CO w/o shunt; c. 09/2007 Echo: EF 45%. Mild to mod MR; c. 03/2015 Echo: EF 45%, global HK. Gr1 DD. Mild MR. Mildly dil LA.    Past Surgical History:  Procedure Laterality Date   ABDOMINAL AORTOGRAM W/LOWER EXTREMITY N/A 03/07/2023   Procedure: ABDOMINAL AORTOGRAM W/LOWER EXTREMITY;  Surgeon: Cephus Shelling, MD;  Location: [facility] INVASIVE CV LAB;  Service: Cardiovascular;  Laterality: N/A;   ABDOMINAL AORTOGRAM W/LOWER EXTREMITY N/A 03/27/2023   Procedure: ABDOMINAL AORTOGRAM W/LOWER EXTREMITY;  Surgeon: Victorino Sparrow, MD;  Location: [facility] INVASIVE CV LAB;  Service: Cardiovascular;  Laterality: N/A;   APPENDECTOMY     BACK SURGERY     CARDIOVERSION N/A 03/14/2018   Procedure: CARDIOVERSION;   Surgeon: Pricilla Riffle, MD;  Location: [facility] ENDOSCOPY;  Service: Cardiovascular;  Laterality: N/A;   PERIPHERAL VASCULAR BALLOON ANGIOPLASTY  03/27/2023   Procedure: PERIPHERAL VASCULAR BALLOON ANGIOPLASTY;  Surgeon: Victorino Sparrow, MD;  Location: [facility] INVASIVE CV LAB;  Service: Cardiovascular;;  Right AT   TEE WITHOUT CARDIOVERSION N/A 03/14/2018   Procedure: TRANSESOPHAGEAL ECHOCARDIOGRAM (TEE);  Surgeon: Pricilla Riffle, MD;  Location: [facility] ENDOSCOPY;  Service: Cardiovascular;  Laterality: N/A;   TEE WITHOUT CARDIOVERSION N/A 02/26/2022   Procedure: TRANSESOPHAGEAL ECHOCARDIOGRAM (TEE);  Surgeon: Dorthula Nettles, DO;  Location: [facility] ENDOSCOPY;  Service: Cardiovascular;  Laterality: N/A;    No Known Allergies  Current Outpatient Medications  Medication Sig Dispense Refill   atorvastatin (LIPITOR) 40 MG tablet Take 1 tablet (40 mg total) by mouth daily. 30 tablet 0   carvedilol (COREG) 3.125 MG tablet Take 3.125 mg by mouth 2 (two) times daily.     clopidogrel (PLAVIX) 75 MG tablet Take 1 tablet (75 mg total) by mouth daily. 90 tablet 4   dofetilide (TIKOSYN) 125 MCG capsule Take 1 capsule (125 mcg total) by mouth 2 (two) times daily. 60 capsule 3   ELIQUIS 5 MG TABS tablet TAKE 1 TABLET BY MOUTH TWICE  DAILY 200 tablet 2   feeding supplement (ENSURE IMMUNE  HEALTH) LIQD Take 237 mLs by mouth 3 (three) times daily with meals.     furosemide (LASIX) 40 MG tablet Take 0.5 tablets (20 mg total) by mouth daily. 45 tablet 1   methimazole (TAPAZOLE) 5 MG tablet Take 1 tablet (5 mg total) by mouth daily. 90 tablet 3   oxyCODONE-acetaminophen (PERCOCET/ROXICET) 5-325 MG tablet Take 1 tablet by mouth every 6 (six) hours as needed for severe pain (pain score 7-10). 30 tablet 0   potassium chloride (KLOR-CON) 10 MEQ tablet TAKE 1 TABLET(10 MEQ) BY MOUTH DAILY 90 tablet 3   spironolactone (ALDACTONE) 25 MG tablet TAKE 1/2 TABLET(12.5 MG) BY MOUTH DAILY 45 tablet 3   No current facility-administered medications  for this visit.    Family History  Problem Relation Age of Onset   Cancer Mother    Heart disease Father     Social History   Socioeconomic History   Marital status: Single    Spouse name: Not on file   Number of children: 1   Years of education: Not on file   Highest education level: High school graduate  Occupational History   Occupation: unemployed    Comment: on disablity  Tobacco Use   Smoking status: Every Day    Current packs/day: 0.00    Average packs/day: 2.0 packs/day for 30.0 years (60.0 ttl pk-yrs)    Types: Cigarettes    Start date: 02/09/1988    Last attempt to quit: 02/08/2018    Years since quitting: 5.1   Smokeless tobacco: Never   Tobacco comments:    3 cigarettes daily 02/09/22  Vaping Use   Vaping status: Never Used  Substance and Sexual Activity   Alcohol use: No    Comment: quit 2 years ago   Drug use: No   Sexual activity: Not Currently  Other Topics Concern   Not on file  Social History Narrative   Not on file   Social Drivers of Health   Financial Resource Strain: Low Risk  (07/17/2022)   Overall Financial Resource Strain (CARDIA)    Difficulty of Paying Living Expenses: Not hard at all  Food Insecurity: No Food Insecurity (03/04/2023)   Hunger Vital Sign    Worried About Running Out of Food in the Last Year: Never true    Ran Out of Food in the Last Year: Never true  Transportation Needs: Unmet Transportation Needs (03/04/2023)   PRAPARE - Administrator, Civil Service (Medical): Yes    Lack of Transportation (Non-Medical): No  Physical Activity: Insufficiently Active (07/17/2022)   Exercise Vital Sign    Days of Exercise per Week: 3 days    Minutes of Exercise per Session: 30 min  Stress: No Stress Concern Present (07/17/2022)   [employer] of Occupational Health - Occupational Stress Questionnaire    Feeling of Stress : Not at all  Social Connections: Moderately Isolated (03/04/2023)   Social Connection and Isolation  Panel [NHANES]    Frequency of Communication with Friends and Family: More than three times a week    Frequency of Social Gatherings with Friends and Family: Three times a week    Attends Religious Services: More than 4 times per year    Active Member of Clubs or Organizations: No    Attends Banker Meetings: Never    Marital Status: Never married  Intimate Partner Violence: Not At Risk (03/04/2023)   Humiliation, Afraid, Rape, and Kick questionnaire    Fear of Current or Ex-Partner: No  Emotionally Abused: No    Physically Abused: No    Sexually Abused: No     REVIEW OF SYSTEMS:   [X]  denotes positive finding, [ ]  denotes negative finding Cardiac  Comments:  Chest pain or chest pressure:    Shortness of breath upon exertion:    Short of breath when lying flat:    Irregular heart rhythm:        Vascular    Pain in calf, thigh, or hip brought on by ambulation:    Pain in feet at night that wakes you up from your sleep:     Blood clot in your veins:    Leg swelling:         Pulmonary    Oxygen at home:    Productive cough:     Wheezing:         Neurologic    Sudden weakness in arms or legs:     Sudden numbness in arms or legs:     Sudden onset of difficulty speaking or slurred speech:    Temporary loss of vision in one eye:     Problems with dizziness:         Gastrointestinal    Blood in stool:     Vomited blood:         Genitourinary    Burning when urinating:     Blood in urine:        Psychiatric    Major depression:         Hematologic    Bleeding problems:    Problems with blood clotting too easily:        Skin    Rashes or ulcers: x       Constitutional    Fever or chills:      PHYSICAL EXAMINATION:  Today's Vitals   04/17/23 0914  BP: 124/76  Pulse: 66  Temp: (!) 97.2 F (36.2 C)  TempSrc: Temporal  SpO2: 97%  Height: 5\' 5"  (1.651 m)  PainSc: 7    Body mass index is 22.8 kg/m.   General:  WDWN in NAD; vital signs  documented above Gait: Not observed HENT: WNL, normocephalic Pulmonary: normal non-labored breathing , without wheezing Cardiac: irregular HR, without carotid bruits Skin: without rashes Vascular Exam/Pulses:  Right Left  Radial 2+ (normal) 2+ (normal)  DP monophasic multiphasic  PT monophasic multiphasic   Extremities: ischemic changes to toes on right      Musculoskeletal: no muscle wasting or atrophy  Neurologic: A&O X 3 Psychiatric:  The pt has Normal affect.   Non-Invasive Vascular Imaging:   ABI's/TBI's on 04/17/2023: Right:  1.23/not done - Great toe pressure: not done Left:  1.15/0.52 - Great toe pressure: 60  Arterial duplex on 04/17/2023: IVC/Iliac: Patent right external iliac artery stent with no visualized stenosis      ASSESSMENT/PLAN:: 63 y.o. female here for follow up for PAD with hx of angiogram with balloon angioplasty of the right TPT, PTA on 03/27/2023 by Dr. Karin Lieu for blue toe syndrome.  She underwent angiogram with right EIA angioplasty and stent on 03/07/2023 for CLI with rest pain by Dr. Chestine Spore   -pt with gangrenous toes on the right foot as pictured above.  There is no drainage from around the toes.  Discussed with pt and sister that toes may auto amputate. -will put in stat referral to podiatry -I did sent a refill on the Percocet 5/325 #20 no refill.  Discussed with she and her sister, if  she needs refill, she will need referral to pain management clinic.  A week of doxycycline was also sent given her foot wound.  -continue plavix/statin.  She is on Eliquis -pt will f/u in 2 months with RLE arterial duplex and ABI.   Doreatha Massed, [facility] [phone]  Clinic MD:   Randie Heinz

## 2023-04-19 ENCOUNTER — Other Ambulatory Visit: Payer: Self-pay | Admitting: *Deleted

## 2023-04-19 DIAGNOSIS — I70238 Atherosclerosis of native arteries of right leg with ulceration of other part of lower right leg: Secondary | ICD-10-CM

## 2023-04-23 ENCOUNTER — Ambulatory Visit (HOSPITAL_COMMUNITY): Payer: 59 | Admitting: Internal Medicine

## 2023-04-28 ENCOUNTER — Other Ambulatory Visit (HOSPITAL_COMMUNITY): Payer: Self-pay | Admitting: Cardiology

## 2023-05-01 ENCOUNTER — Ambulatory Visit (INDEPENDENT_AMBULATORY_CARE_PROVIDER_SITE_OTHER): Admitting: Podiatry

## 2023-05-01 DIAGNOSIS — I96 Gangrene, not elsewhere classified: Secondary | ICD-10-CM | POA: Diagnosis not present

## 2023-05-01 MED ORDER — OXYCODONE-ACETAMINOPHEN 5-325 MG PO TABS: 1.0000 | ORAL_TABLET | Freq: Four times a day (QID) | ORAL | 0 refills | Status: DC | PRN

## 2023-05-01 NOTE — Progress Notes (Signed)
 Chief Complaint  Patient presents with   Wound Check    RM#7 Follow up on gangrene of toe of right foot.Patient state having pain feeling more sensation in toes.    HPI: 63 y.o. female presenting today for follow-up evaluation of ischemic gangrene to the toes RLE.  They have been applying Betadine daily.  Vascular HPI:  hx of angiogram with balloon angioplasty of the right TPT, PTA on 03/27/2023 by Dr. Karin Lieu for blue toe syndrome.  She underwent angiogram with right EIA angioplasty and stent on 03/07/2023 for CLI with rest pain by Dr. Chestine Spore.   Past Medical History:  Diagnosis Date   Atrial fibrillation with RVR (HCC)    Chronic combined systolic (congestive) and diastolic (congestive) heart failure (HCC)    Dyspnea    Goiter    Hypertension    NICM (nonischemic cardiomyopathy) (HCC)    a. 08/2005 Echo: EF 30-35%, mod diff HK. Mild to Mod MR. Mildly dil LA; b. 08/2005 Cath: Nl Cors. Elevated CO w/o shunt; c. 09/2007 Echo: EF 45%. Mild to mod MR; c. 03/2015 Echo: EF 45%, global HK. Gr1 DD. Mild MR. Mildly dil LA.    Past Surgical History:  Procedure Laterality Date   ABDOMINAL AORTOGRAM W/LOWER EXTREMITY N/A 03/07/2023   Procedure: ABDOMINAL AORTOGRAM W/LOWER EXTREMITY;  Surgeon: Cephus Shelling, MD;  [facility]: [facility] INVASIVE CV LAB;  Service: Cardiovascular;  Laterality: N/A;   ABDOMINAL AORTOGRAM W/LOWER EXTREMITY N/A 03/27/2023   Procedure: ABDOMINAL AORTOGRAM W/LOWER EXTREMITY;  Surgeon: [place]ino Sparrow, MD;  Location: [facility] INVASIVE CV LAB;  Service: Cardiovascular;  Laterality: N/A;   APPENDECTOMY     BACK SURGERY     CARDIOVERSION N/A 03/14/2018   Procedure: CARDIOVERSION;  Surgeon: Pricilla Riffle, MD;  Location: [facility] ENDOSCOPY;  Service: Cardiovascular;  Laterality: N/A;   PERIPHERAL VASCULAR BALLOON ANGIOPLASTY  03/27/2023   Procedure: PERIPHERAL VASCULAR BALLOON ANGIOPLASTY;  Surgeon: [place] Sparrow, MD;  Location: [facility] INVASIVE CV LAB;  Service: Cardiovascular;;  Right AT   TEE  WITHOUT CARDIOVERSION N/A 03/14/2018   Procedure: TRANSESOPHAGEAL ECHOCARDIOGRAM (TEE);  Surgeon: Pricilla Riffle, MD;  Location: [facility] ENDOSCOPY;  Service: Cardiovascular;  Laterality: N/A;   TEE WITHOUT CARDIOVERSION N/A 02/26/2022   Procedure: TRANSESOPHAGEAL ECHOCARDIOGRAM (TEE);  Surgeon: Dorthula Nettles, DO;  Location: [facility] ENDOSCOPY;  Service: Cardiovascular;  Laterality: N/A;    No Known Allergies     RT foot 04/17/2023     RT foot 05/01/2023    Physical Exam: General: The patient is alert and oriented x3 in no acute distress.  Dermatology: Continued stable gangrene to the digits 1-3 of the right foot.  No drainage.  No malodor.  Vascular: hx of angiogram with balloon angioplasty of the right TPT, PTA on 03/27/2023 by Dr. Karin Lieu for blue toe syndrome.  She underwent angiogram with right EIA angioplasty and stent on 03/07/2023 for CLI with rest pain by Dr. Chestine Spore.   Neurological: Diminished via light touch  Musculoskeletal Exam: No pedal deformity.  No prior amputation.  Minimally ambulatory secondary to pain  Assessment/Plan of Care: 1.  Critical limb ischemia  2.  Dry stable gangrene digits 1-3 right foot 3.  Pain due to onychomycosis of toenails bilateral  -Patient evaluated.  Continue to closely observe. - Continue plan for close observation with Betadine wet-to-dry daily -As long as the toes remain stable we will allow for autoamputation.  Patient understands that the toes are no longer viable but there is concerned that amputation may progress to  nonhealing and more proximal limb loss. -Prescription for Percocet 5/325 mg as needed pain -Return to clinic 4 weeks      Felecia Shelling, DPM [facility]  Dr. Felecia Shelling, DPM    [address] [place], [place] [address]                Office [phone]  Fax [phone]

## 2023-05-02 ENCOUNTER — Ambulatory Visit (HOSPITAL_COMMUNITY)
Admission: RE | Admit: 2023-05-02 | Discharge: 2023-05-02 | Disposition: A | Discharge status: Home / Self Care | Source: Ambulatory Visit | Attending: Internal Medicine | Admitting: Internal Medicine

## 2023-05-02 VITALS — BP 112/74 | HR 75 | Ht 65.0 in | Wt 127.8 lb

## 2023-05-02 DIAGNOSIS — F17200 Nicotine dependence, unspecified, uncomplicated: Secondary | ICD-10-CM | POA: Insufficient documentation

## 2023-05-02 DIAGNOSIS — Z79899 Other long term (current) drug therapy: Secondary | ICD-10-CM | POA: Diagnosis not present

## 2023-05-02 DIAGNOSIS — I4819 Other persistent atrial fibrillation: Secondary | ICD-10-CM

## 2023-05-02 DIAGNOSIS — Z5181 Encounter for therapeutic drug level monitoring: Secondary | ICD-10-CM | POA: Diagnosis not present

## 2023-05-02 DIAGNOSIS — I340 Nonrheumatic mitral (valve) insufficiency: Secondary | ICD-10-CM | POA: Insufficient documentation

## 2023-05-02 DIAGNOSIS — Z7901 Long term (current) use of anticoagulants: Secondary | ICD-10-CM | POA: Diagnosis not present

## 2023-05-02 DIAGNOSIS — D6869 Other thrombophilia: Secondary | ICD-10-CM

## 2023-05-02 DIAGNOSIS — I110 Hypertensive heart disease with heart failure: Secondary | ICD-10-CM | POA: Insufficient documentation

## 2023-05-02 DIAGNOSIS — I5042 Chronic combined systolic (congestive) and diastolic (congestive) heart failure: Secondary | ICD-10-CM | POA: Diagnosis present

## 2023-05-02 NOTE — Progress Notes (Signed)
 Primary Care Physician: Marcine Matar, MD Primary Cardiologist: Dr Elease Hashimoto Primary Electrophysiologist: Dr Elberta Fortis [facility]: Dr Gasper Lloyd Referring Physician: Manson Passey PA   Joanna [name] is a 63 y.o. female with a history of HTN, combined chronic systolic and diastolic CHF, tobacco abuse, MR, and persistent atrial fibrillation who presents for follow up in the [facility].  The patient was initially diagnosed with atrial fibrillation on 03/08/18 at the ER after presenting with symptoms of dyspnea on exertion for one week prior. ECG at ER showed afib with RVR and she was given Lopressor and started on Cardizem drip and heparin. She underwent successful DCCV. Echo on admission showed severely reduced EF 20-25%, possibly tachycardia mediated. Discharged on Eliquis and Lopressor. On follow up, she was noted to be in afib again with RVR. Started on amiodarone which did well keeping her in SR. Unfortunately, she developed amiodarone induced hyperthyroidism and this was discontinued 10/16/21.  Patient was hospitalized 12/20/21 with acute CHF in the setting of rapid Afib. Echo showed EF back down again, 25-30% w/ global HK, LV mod-severely dilated, LA severely dilated w/ eccentric MR, posteriorly directed into LA, severe regurgitation. She was diuresed w/ IV Lasix and transitioned to PO with additions of Farxiga and spironolactone. Treated w/ Coreg and digoxin for rate control.   On follow up today, patient is s/p dofetilide admission 12/20-12/23/23. She converted with the medication and did not require DCCV. She had a post termination pause lasting 4.2 seconds, digoxin was discontinued and carvedilol decreased. Patient remains in SR today and is feeling well. She walked into the clinic instead of using the wheelchair.   On follow up 07/16/22, she is currently in NSR. S/p Tikosyn load 12/20-23/23. Seen by Francis Dowse 04/17/22 with stable Qtc and maintaining SR. She has  not noted any episodes of Afib since OV with [name]. She complains of right leg pain ongoing which is not new; she uses a cane for ambulation. She needs a refill of Tikosyn as she took her last dose this morning. No missed doses of anticoagulation.  On follow up 10/29/22, she is currently in NSR. She has had no episodes of Afib since last office visit. She feels well overall except she just received an injection to help with her right leg pain. No missed doses of Tikosyn. No bleeding issues on Eliquis.   On follow up 05/02/23, she is here for Tikosyn surveillance. She is currently in NSR. She has had no episodes of Afib since last office visit. No missed doses of Tikosyn or Eliquis. She is in a lot of pain related to her gangrene of right foot.   Today, she denies symptoms of palpitations, chest pain, orthopnea, PND, lower extremity edema, dizziness, presyncope, syncope, snoring, daytime somnolence, bleeding, or neurologic sequela. The patient is tolerating medications without difficulties and is otherwise without complaint today.    Atrial Fibrillation Risk Factors:  she does not have symptoms or diagnosis of sleep apnea. she does not have a history of rheumatic fever. she does not have a history of alcohol use. The patient does not have a history of early familial atrial fibrillation or other arrhythmias.  she has a BMI of Body mass index is 21.27 kg/m.Marland Kitchen Filed Weights   05/02/23 1401  Weight: 58 kg     Family History  Problem Relation Age of Onset   Cancer Mother    Heart disease Father     Atrial Fibrillation Management history:  Previous antiarrhythmic drugs: amiodarone,  dofetilide  Previous cardioversions: 03/14/2018 Previous ablations: none CHADS2VASC score: 3  Anticoagulation history: Eliquis   Past Medical History:  Diagnosis Date   Atrial fibrillation with RVR (HCC)    Chronic combined systolic (congestive) and diastolic (congestive) heart failure (HCC)    Dyspnea     Goiter    Hypertension    NICM (nonischemic cardiomyopathy) (HCC)    a. 08/2005 Echo: EF 30-35%, mod diff HK. Mild to Mod MR. Mildly dil LA; b. 08/2005 Cath: Nl Cors. Elevated CO w/o shunt; c. 09/2007 Echo: EF 45%. Mild to mod MR; c. 03/2015 Echo: EF 45%, global HK. Gr1 DD. Mild MR. Mildly dil LA.   Past Surgical History:  Procedure Laterality Date   ABDOMINAL AORTOGRAM W/LOWER EXTREMITY N/A 03/07/2023   Procedure: ABDOMINAL AORTOGRAM W/LOWER EXTREMITY;  Surgeon: Cephus Shelling, MD;  [facility]: [facility] INVAS[facility] CV LAB;  Service: Cardiovascular;  Laterality: N/A;   ABDOMINAL AORTOGRAM W/LOWER EXTREMITY N/A 03/27/2023   Procedure: ABDOMINAL AORTOGRAM W/LOWER EXTREMITY;  Surgeon: [place]ino Sparrow, MD;  Location: [facility] INVASIVE CV LAB;  Service: Cardiovascular;  Laterality: N/A;   APPENDECTOMY     BACK SURGERY     CARDIOVERSION N/A 03/14/2018   Procedure: CARDIOVERSION;  Surgeon: Pricilla Riffle, MD;  Location: [facility] ENDOSCOPY;  Service: Cardiovascular;  Laterality: N/A;   PERIPHERAL VASCULAR BALLOON ANGIOPLASTY  03/27/2023   Procedure: PERIPHERAL VASCULAR BALLOON ANGIOPLASTY;  Surgeon: [place] Sparrow, MD;  Location: [facility] INVASIVE CV LAB;  Service: Cardiovascular;;  Right AT   TEE WITHOUT CARDIOVERSION N/A 03/14/2018   Procedure: TRANSESOPHAGEAL ECHOCARDIOGRAM (TEE);  Surgeon: Pricilla Riffle, MD;  Location: [facility] ENDOSCOPY;  Service: Cardiovascular;  Laterality: N/A;   TEE WITHOUT CARDIOVERSION N/A 02/26/2022   Procedure: TRANSESOPHAGEAL ECHOCARDIOGRAM (TEE);  Surgeon: Dorthula Nettles, DO;  Location: [facility] ENDOSCOPY;  Service: Cardiovascular;  Laterality: N/A;    Current Outpatient Medications  Medication Sig Dispense Refill   atorvastatin (LIPITOR) 40 MG tablet Take 1 tablet (40 mg total) by mouth daily. 30 tablet 0   carvedilol (COREG) 3.125 MG tablet Take 3.125 mg by mouth 2 (two) times daily.     clopidogrel (PLAVIX) 75 MG tablet Take 1 tablet (75 mg total) by mouth daily. 90 tablet 4   dofetilide (TIKOSYN) 125  MCG capsule Take 1 capsule (125 mcg total) by mouth 2 (two) times daily. 60 capsule 3   ELIQUIS 5 MG TABS tablet TAKE 1 TABLET BY MOUTH TWICE  DAILY 200 tablet 2   feeding supplement (ENSURE IMMUNE HEALTH) LIQD Take 237 mLs by mouth 3 (three) times daily with meals.     furosemide (LASIX) 40 MG tablet Take 0.5 tablets (20 mg total) by mouth daily. 45 tablet 1   methimazole (TAPAZOLE) 5 MG tablet Take 1 tablet (5 mg total) by mouth daily. 90 tablet 3   oxyCODONE-acetaminophen (PERCOCET/ROXICET) 5-325 MG tablet Take 1 tablet by mouth every 6 (six) hours as needed for severe pain (pain score 7-10). 30 tablet 0   potassium chloride (KLOR-CON) 10 MEQ tablet TAKE 1 TABLET(10 MEQ) BY MOUTH DAILY 90 tablet 3   spironolactone (ALDACTONE) 25 MG tablet TAKE 1/2 TABLET(12.5 MG) BY MOUTH DAILY 45 tablet 3   No current facility-administered medications for this encounter.    No Known Allergies  ROS- All systems are reviewed and negative except as per the HPI above.  Physical Exam: Vitals:   05/02/23 1401  BP: 112/74  Pulse: 75  Weight: 58 kg  Height: 5\' 5"  (1.651 m)    GEN-  The patient is well appearing, alert and oriented x 3 today.   Neck - no JVD or carotid bruit noted Lungs- Clear to ausculation bilaterally, normal work of breathing Heart- Regular rate and rhythm, no murmurs, rubs or gallops, PMI not laterally displaced Extremities- no clubbing, cyanosis, or edema Skin - no rash or ecchymosis noted   Wt Readings from Last 3 Encounters:  05/02/23 58 kg  03/27/23 62.1 kg  03/21/23 63.5 kg    EKG today demonstrates Vent. rate 75 BPM PR interval 132 ms QRS duration 86 ms QT/QTcB 392/437 ms P-R-T axes 83 77 83 Normal sinus rhythm Normal ECG When compared with ECG of 04-Mar-2023 21:11, PREVIOUS ECG IS PRESENT  Echo 02/26/22 (TEE) demonstrated  1. Left ventricular ejection fraction, by estimation, is 35 to 40%. The  left ventricle has mild to moderately decreased function. The left   ventricle has no regional wall motion abnormalities. Left ventricular  diastolic function could not be evaluated.   2. Right ventricular systolic function is normal. The right ventricular  size is normal.   3. Left atrial size was mild to moderately dilated. No left atrial/left  atrial appendage thrombus was detected.   4. The mitral valve is normal in structure. Mild to moderate mitral valve  regurgitation. No evidence of mitral stenosis.   5. The aortic valve is normal in structure. Aortic valve regurgitation is  not visualized. No aortic stenosis is present.   6. The inferior vena cava is normal in size with greater than 50%  respiratory variability, suggesting right atrial pressure of 3 mmHg.    Epic records are reviewed at length today   CHA2DS2-VASc Score = 3  The patient's score is based upon: CHF History: 1 HTN History: 1 Diabetes History: 0 Stroke History: 0 Vascular Disease History: 0 Age Score: 0 Gender Score: 1       ASSESSMENT AND PLAN: 1. Persistent Atrial Fibrillation (ICD10:  I48.19) The patient's CHA2DS2-VASc score is 3, indicating a 3.2% annual risk of stroke.   Patient off amiodarone due to hyperthyroidism, on methimazole.  S/p dofetilide admission 12/20-12/23/23.  She is currently in NSR.  High risk medication monitoring (ICD10: R7229428) Patient requires ongoing monitoring for anti-arrhythmic medication which has the potential to cause life threatening arrhythmias or AV block. Qtc stable. Continue Tikosyn 125 mcg BID. I-STAT on 2/19 stable; magnesium on 1/31 stable.   2. Secondary Hypercoagulable State (ICD10:  D68.69) The patient is at significant risk for stroke/thromboembolism based upon her CHA2DS2-VASc Score of 3.  Continue Apixaban (Eliquis).  Continue Eliquis 5 mg BID without interruption.   3. Chronic systolic CHF Suspected tachycardia mediated.  GDMT per [facility] [place], followed by Dr Gasper Lloyd. Appears euvolemic today.   Follow up in 6 months  Afib clinic for Tikosyn surveillance.   [place] Bells, PA-C [facility] [address] [place], [place] [address] [phone] 05/02/2023 2:37 PM

## 2023-05-06 ENCOUNTER — Other Ambulatory Visit: Payer: Self-pay | Admitting: Internal Medicine

## 2023-05-06 NOTE — Telephone Encounter (Signed)
 Copied from CRM [id number]. Topic: Clinical - Medication Refill >> May 06, 2023 10:42 AM Izetta Dakin wrote: Most Recent Primary Care Visit:  Provider: Anthoney Harada  Department: [facility]  Visit Type: MEDICARE AWV, INITIAL  Date: 07/17/2022  Medication: atorvastatin (LIPITOR) 40 MG tablet    Has the patient contacted their pharmacy? Yes (Agent: If no, request that the patient contact the pharmacy for the refill. If patient does not wish to contact the pharmacy document the reason why and proceed with request.) (Agent: If yes, when and what did the pharmacy advise?)  Is this the correct pharmacy for this prescription? Yes If no, delete pharmacy and type the correct one.  This is the patient's preferred pharmacy:    [id number] - [place], [place] - [address] AT [facility] [address] [place] [address] Phone: [phone] Fax: [phone]    Has the prescription been filled recently? No  Is the patient out of the medication? Yes  Has the patient been seen for an appointment in the last year OR does the patient have an upcoming appointment? Yes  Can we respond through MyChart? No  Agent: Please be advised that Rx refills may take up to 3 business days. We ask that you follow-up with your pharmacy.

## 2023-05-07 ENCOUNTER — Telehealth: Payer: Self-pay

## 2023-05-07 ENCOUNTER — Other Ambulatory Visit: Payer: Self-pay | Admitting: Vascular Surgery

## 2023-05-07 NOTE — Telephone Encounter (Signed)
 Medication Refill:  -pt called stating she is having a hard time getting her Atorvastatin refilled from her PCP and asked if we could help.   -message sent to her Cardiologist for assistance - Dr. Dorthula Nettles.   -pt will call us if unable to get medication by tomorrow.

## 2023-05-08 MED ORDER — ATORVASTATIN CALCIUM 40 MG PO TABS: 40.0000 mg | ORAL_TABLET | Freq: Every day | ORAL | 0 refills | Status: DC

## 2023-05-08 NOTE — Telephone Encounter (Signed)
 Patient has not been seen by Dr. Laural Benes in 15 months.  Needs to schedule an appointment.  1 month courtesy refill has been sent in.

## 2023-05-08 NOTE — Telephone Encounter (Signed)
 Requested medication (s) are due for refill today: yes  Requested medication (s) are on the active medication list: ues  Last refill:  04/08/23 Future visit scheduled: yes  Notes to clinic:  this med was last refilled by Dr Karin Lieu at  [facility]- this what was found in chart.    Requested Prescriptions  Pending Prescriptions Disp Refills   atorvastatin (LIPITOR) 40 MG tablet 30 tablet 0    Sig: Take 1 tablet (40 mg total) by mouth daily.     Cardiovascular:  Antilipid - Statins Failed - 05/08/2023 10:30 AM      Failed - Valid encounter within last 12 months    Recent Outpatient Visits           1 year ago Establishing care with new doctor, encounter for   [facility] [place]. [facility] Marcine Matar, MD   5 years ago Hospital discharge follow-up   [facility] [place]. [facility] Marcine Matar, MD   8 years ago CAP (community acquired pneumonia)   [facility] [place] [facility] [place]. [facility] [place] T, MD              Failed - Lipid Panel in normal range within the last 12 months    Cholesterol, Total  Date Value Ref Range Status  08/03/2020 176 100 - 199 mg/dL Final   Cholesterol  Date Value Ref Range Status  03/08/2023 101 0 - 200 mg/dL Final   LDL Chol Calc (NIH)  Date Value Ref Range Status  08/03/2020 109 (H) 0 - 99 mg/dL Final   LDL Cholesterol  Date Value Ref Range Status  03/08/2023 56 0 - 99 mg/dL Final    Comment:           Total Cholesterol/HDL:CHD Risk Coronary Heart Disease Risk Table                     Men   Women  1/2 Average Risk   3.4   3.3  Average Risk       5.0   4.4  2 X Average Risk   9.6   7.1  3 X Average Risk  23.4   11.0        Use the calculated Patient Ratio above and the CHD Risk Table to determine the patient's CHD Risk.        ATP III CLASSIFICATION (LDL):  <100     mg/dL   Optimal  308-657   mg/dL   Near or Above                    Optimal  130-159  mg/dL   Borderline  846-962  mg/dL   High  >952     mg/dL   Very High Performed at [facility], [address], [place], [place] [address]    HDL  Date Value Ref Range Status  03/08/2023 27 (L) >40 mg/dL Final  44/02/270 41 >53 mg/dL Final   Triglycerides  Date Value Ref Range Status  03/08/2023 88 <150 mg/dL Final         Passed - Patient is not pregnant

## 2023-05-10 NOTE — Telephone Encounter (Signed)
 Follow up with PCP due to medication needs to be monitored

## 2023-05-17 ENCOUNTER — Other Ambulatory Visit (HOSPITAL_COMMUNITY): Payer: Self-pay | Admitting: Cardiology

## 2023-05-28 ENCOUNTER — Other Ambulatory Visit: Payer: Self-pay | Admitting: Family Medicine

## 2023-05-29 ENCOUNTER — Encounter: Payer: Self-pay | Admitting: Podiatry

## 2023-05-29 ENCOUNTER — Ambulatory Visit (INDEPENDENT_AMBULATORY_CARE_PROVIDER_SITE_OTHER): Admitting: Podiatry

## 2023-05-29 VITALS — Ht 65.0 in | Wt 127.0 lb

## 2023-05-29 DIAGNOSIS — I70223 Atherosclerosis of native arteries of extremities with rest pain, bilateral legs: Secondary | ICD-10-CM

## 2023-05-29 DIAGNOSIS — I96 Gangrene, not elsewhere classified: Secondary | ICD-10-CM

## 2023-05-29 MED ORDER — OXYCODONE-ACETAMINOPHEN 5-325 MG PO TABS: 1.0000 | ORAL_TABLET | Freq: Four times a day (QID) | ORAL | 0 refills | Status: DC | PRN

## 2023-05-30 ENCOUNTER — Other Ambulatory Visit: Payer: Self-pay | Admitting: Family Medicine

## 2023-06-03 ENCOUNTER — Other Ambulatory Visit: Payer: Self-pay

## 2023-06-03 ENCOUNTER — Other Ambulatory Visit (HOSPITAL_COMMUNITY): Payer: Self-pay

## 2023-06-03 MED ORDER — METHIMAZOLE 5 MG PO TABS: 5.0000 mg | ORAL_TABLET | Freq: Every day | ORAL | 3 refills | Status: DC

## 2023-06-03 NOTE — Telephone Encounter (Signed)
 Pt's local pharmacy [facility] requesting pt's medication methimazole  be sent to their pharmacy, please address

## 2023-06-10 ENCOUNTER — Other Ambulatory Visit: Payer: Self-pay | Admitting: Family Medicine

## 2023-06-11 ENCOUNTER — Ambulatory Visit: Attending: Internal Medicine | Admitting: Internal Medicine

## 2023-06-11 VITALS — BP 132/82 | HR 61 | Temp 97.6°F | Ht 65.0 in | Wt 136.0 lb

## 2023-06-11 DIAGNOSIS — I428 Other cardiomyopathies: Secondary | ICD-10-CM | POA: Diagnosis not present

## 2023-06-11 DIAGNOSIS — E0590 Thyrotoxicosis, unspecified without thyrotoxic crisis or storm: Secondary | ICD-10-CM

## 2023-06-11 DIAGNOSIS — I480 Paroxysmal atrial fibrillation: Secondary | ICD-10-CM

## 2023-06-11 DIAGNOSIS — Z2821 Immunization not carried out because of patient refusal: Secondary | ICD-10-CM

## 2023-06-11 DIAGNOSIS — Z1231 Encounter for screening mammogram for malignant neoplasm of breast: Secondary | ICD-10-CM

## 2023-06-11 DIAGNOSIS — I96 Gangrene, not elsewhere classified: Secondary | ICD-10-CM

## 2023-06-11 DIAGNOSIS — I739 Peripheral vascular disease, unspecified: Secondary | ICD-10-CM

## 2023-06-11 DIAGNOSIS — I5022 Chronic systolic (congestive) heart failure: Secondary | ICD-10-CM | POA: Diagnosis not present

## 2023-06-11 DIAGNOSIS — F17200 Nicotine dependence, unspecified, uncomplicated: Secondary | ICD-10-CM

## 2023-06-11 DIAGNOSIS — F17210 Nicotine dependence, cigarettes, uncomplicated: Secondary | ICD-10-CM

## 2023-06-11 MED ORDER — SPIRONOLACTONE 25 MG PO TABS: 12.5000 mg | ORAL_TABLET | Freq: Every day | ORAL | 3 refills | Status: DC

## 2023-06-11 MED ORDER — ATORVASTATIN CALCIUM 40 MG PO TABS: 40.0000 mg | ORAL_TABLET | Freq: Every day | ORAL | 2 refills | Status: DC

## 2023-06-11 NOTE — Patient Instructions (Signed)
 You can call [phone] when you are ready to try the nicotine  patches.  Call your podiatrist, Dr. Luster Salters for refil on Oxycodone .  Please return to the lab to have thyroid  level recheck

## 2023-06-11 NOTE — Progress Notes (Unsigned)
 Patient ID: Joanna [name], female    DOB: Mar 18, 1960  MRN: [id number]  CC: Hypertension (HTN f/u.  Requesting refill for atorvastatin , oxycodone /Necrotic toes on R foot )   Subjective: [name] is a 63 y.o. female who presents for chronic ds management. Sister, Gioia Laity, is with her.  Her concerns today include:  Patient with history of HTN, combined CHF with EF of 25-30%, severe MR, A.fib CHA2DS2-VASc score 3, amiodarone  thyroiditis discontinued 10/2021 (followed by Dr. Floria Hurst and Lawana Pray), tob dep, COPD   Discussed the use of AI scribe software for clinical note transcription with the patient, who gave verbal consent to proceed.  History of Present Illness     Patient Active Problem List   Diagnosis Date Noted   Sepsis (HCC) 03/03/2023   Nerve damage 08/22/2022   Encounter for monitoring dofetilide  therapy 07/16/2022   Persistent atrial fibrillation (HCC) 01/16/2022   Hypercoagulable state due to persistent atrial fibrillation (HCC) 01/16/2022   Severe mitral regurgitation 01/15/2022   Chronic systolic heart failure (HCC) 01/15/2022   Amiodarone -induced thyroiditis 12/22/2021   Chronic combined systolic and diastolic CHF (congestive heart failure) (HCC) 09/07/2019   Former smoker 03/17/2018   Essential hypertension 03/17/2018   Atrial fibrillation with RVR (HCC) 03/08/2018   Accelerated hypertension 03/08/2018   Hyperglycemia 03/08/2018   Acute systolic (congestive) heart failure (HCC)    Hypertensive urgency 04/11/2015   COPD (chronic obstructive pulmonary disease) (HCC) 04/01/2015     Current Outpatient Medications on File Prior to Visit  Medication Sig Dispense Refill   carvedilol  (COREG ) 3.125 MG tablet Take 3.125 mg by mouth 2 (two) times daily.     clopidogrel  (PLAVIX ) 75 MG tablet Take 1 tablet (75 mg total) by mouth daily. 90 tablet 4   dofetilide  (TIKOSYN ) 125 MCG capsule Take 1 capsule (125 mcg total) by mouth 2 (two) times daily. 60 capsule 3    ELIQUIS  5 MG TABS tablet TAKE 1 TABLET BY MOUTH TWICE  DAILY 200 tablet 2   furosemide  (LASIX ) 40 MG tablet TAKE ONE-HALF TABLET BY MOUTH  DAILY 50 tablet 2   methimazole  (TAPAZOLE ) 5 MG tablet Take 1 tablet (5 mg total) by mouth daily. 90 tablet 3   potassium chloride  (KLOR-CON ) 10 MEQ tablet TAKE 1 TABLET(10 MEQ) BY MOUTH DAILY 90 tablet 3   spironolactone  (ALDACTONE ) 25 MG tablet TAKE 1/2 TABLET(12.5 MG) BY MOUTH DAILY 45 tablet 3   atorvastatin  (LIPITOR) 40 MG tablet TAKE 1 TABLET BY MOUTH DAILY 30 tablet 11   feeding supplement (ENSURE IMMUNE HEALTH) LIQD Take 237 mLs by mouth 3 (three) times daily with meals.     oxyCODONE -acetaminophen  (PERCOCET/ROXICET) 5-325 MG tablet Take 1 tablet by mouth every 6 (six) hours as needed for severe pain (pain score 7-10). (Patient not taking: Reported on 06/11/2023) 30 tablet 0   No current facility-administered medications on file prior to visit.    No Known Allergies  Social History   Socioeconomic History   Marital status: Single    Spouse name: Not on file   Number of children: 1   Years of education: Not on file   Highest education level: High school graduate  Occupational History   Occupation: unemployed    Comment: on disablity  Tobacco Use   Smoking status: Every Day    Current packs/day: 0.00    Average packs/day: 2.0 packs/day for 30.0 years (60.0 ttl pk-yrs)    Types: Cigarettes    Start date: 02/09/1988    Last attempt to quit:  02/08/2018    Years since quitting: 5.3   Smokeless tobacco: Never   Tobacco comments:    3 cigarettes daily 02/09/22  Vaping Use   Vaping status: Never Used  Substance and Sexual Activity   Alcohol use: No    Comment: quit 2 years ago   Drug use: No   Sexual activity: Not Currently  Other Topics Concern   Not on file  Social History Narrative   Not on file   Social Drivers of Health   Financial Resource Strain: Low Risk  (07/17/2022)   Overall Financial Resource Strain (CARDIA)    Difficulty of  Paying Living Expenses: Not hard at all  Food Insecurity: No Food Insecurity (03/04/2023)   Hunger Vital Sign    Worried About Running Out of Food in the Last Year: Never true    Ran Out of Food in the Last Year: Never true  Transportation Needs: Unmet Transportation Needs (03/04/2023)   PRAPARE - Administrator, Civil Service (Medical): Yes    Lack of Transportation (Non-Medical): No  Physical Activity: Insufficiently Active (07/17/2022)   Exercise Vital Sign    Days of Exercise per Week: 3 days    Minutes of Exercise per Session: 30 min  Stress: No Stress Concern Present (07/17/2022)   [employer] of Occupational Health - Occupational Stress Questionnaire    Feeling of Stress : Not at all  Social Connections: Moderately Isolated (03/04/2023)   Social Connection and Isolation Panel [NHANES]    Frequency of Communication with Friends and Family: More than three times a week    Frequency of Social Gatherings with Friends and Family: Three times a week    Attends Religious Services: More than 4 times per year    Active Member of Clubs or Organizations: No    Attends Banker Meetings: Never    Marital Status: Never married  Intimate Partner Violence: Not At Risk (03/04/2023)   Humiliation, Afraid, Rape, and Kick questionnaire    Fear of Current or Ex-Partner: No    Emotionally Abused: No    Physically Abused: No    Sexually Abused: No    Family History  Problem Relation Age of Onset   Cancer Mother    Heart disease Father     Past Surgical History:  Procedure Laterality Date   ABDOMINAL AORTOGRAM W/LOWER EXTREMITY N/A 03/07/2023   Procedure: ABDOMINAL AORTOGRAM W/LOWER EXTREMITY;  Surgeon: Young Hensen, MD;  Location: [facility] INVASIVE CV LAB;  Service: Cardiovascular;  Laterality: N/A;   ABDOMINAL AORTOGRAM W/LOWER EXTREMITY N/A 03/27/2023   Procedure: ABDOMINAL AORTOGRAM W/LOWER EXTREMITY;  Surgeon: Kayla Part, MD;  Location: [facility] INVASIVE CV LAB;   Service: Cardiovascular;  Laterality: N/A;   APPENDECTOMY     BACK SURGERY     CARDIOVERSION N/A 03/14/2018   Procedure: CARDIOVERSION;  Surgeon: Elmyra Haggard, MD;  Location: [facility] ENDOSCOPY;  Service: Cardiovascular;  Laterality: N/A;   PERIPHERAL VASCULAR BALLOON ANGIOPLASTY  03/27/2023   Procedure: PERIPHERAL VASCULAR BALLOON ANGIOPLASTY;  Surgeon: Kayla Part, MD;  Location: [facility] INVASIVE CV LAB;  Service: Cardiovascular;;  Right AT   TEE WITHOUT CARDIOVERSION N/A 03/14/2018   Procedure: TRANSESOPHAGEAL ECHOCARDIOGRAM (TEE);  Surgeon: Elmyra Haggard, MD;  Location: [facility] ENDOSCOPY;  Service: Cardiovascular;  Laterality: N/A;   TEE WITHOUT CARDIOVERSION N/A 02/26/2022   Procedure: TRANSESOPHAGEAL ECHOCARDIOGRAM (TEE);  Surgeon: Alwin Baars, DO;  Location: [facility] ENDOSCOPY;  Service: Cardiovascular;  Laterality: N/A;    ROS: Review of Systems  Negative except as stated above  PHYSICAL EXAM: BP 133/86 (BP Location: Left Arm, Patient Position: Sitting, Cuff Size: Normal)   Pulse 61   Temp 97.6 F (36.4 C) (Oral)   Ht 5\' 5"  (1.651 m)   Wt 136 lb (61.7 kg)   LMP 06/10/2011   SpO2 100%   BMI 22.63 kg/m   Physical Exam  {female adult master:310786} {female adult master:310785}     Latest Ref Rng & Units 03/27/2023   10:12 AM 03/27/2023    9:20 AM 03/09/2023    3:07 AM  CMP  Glucose 70 - 99 mg/dL 89  83  914   BUN 8 - 23 mg/dL 32  35  18   Creatinine 0.44 - 1.00 mg/dL 7.82  9.56  2.13   Sodium 135 - 145 mmol/L 138  140  139   Potassium 3.5 - 5.1 mmol/L 4.0  4.1  4.0   Chloride 98 - 111 mmol/L 106  104  107   CO2 22 - 32 mmol/L   21   Calcium  8.9 - 10.3 mg/dL   9.0    Lipid Panel     Component Value Date/Time   CHOL 101 03/08/2023 1105   CHOL 176 08/03/2020 0814   TRIG 88 03/08/2023 1105   HDL 27 (L) 03/08/2023 1105   HDL 41 08/03/2020 0814   CHOLHDL 3.7 03/08/2023 1105   VLDL 18 03/08/2023 1105   LDLCALC 56 03/08/2023 1105   LDLCALC 109 (H) 08/03/2020 0814    CBC     Component Value Date/Time   WBC 8.8 03/09/2023 0307   RBC 3.64 (L) 03/09/2023 0307   HGB 11.6 (L) 03/27/2023 1012   HGB 14.4 03/03/2021 0954   HCT 34.0 (L) 03/27/2023 1012   HCT 44.3 03/03/2021 0954   PLT 330 03/09/2023 0307   PLT 378 03/03/2021 0954   MCV 89.8 03/09/2023 0307   MCV 87 03/03/2021 0954   MCH 27.2 03/09/2023 0307   MCHC 30.3 03/09/2023 0307   RDW 13.0 03/09/2023 0307   RDW 11.3 (L) 03/03/2021 0954   LYMPHSABS 1.4 03/03/2023 1703   MONOABS 0.5 03/03/2023 1703   EOSABS 0.1 03/03/2023 1703   BASOSABS 0.0 03/03/2023 1703    ASSESSMENT AND PLAN:  Assessment and Plan Assessment & Plan      1. Chronic systolic heart failure (HCC) ***    Patient was given the opportunity to ask questions.  Patient verbalized understanding of the plan and was able to repeat key elements of the plan.   This documentation was completed using Paediatric nurse.  Any transcriptional errors are unintentional.  No orders of the defined types were placed in this encounter.    Requested Prescriptions   Pending Prescriptions Disp Refills   spironolactone  (ALDACTONE ) 25 MG tablet 45 tablet 3    No follow-ups on file.  Concetta Dee, MD, FACP

## 2023-06-11 NOTE — Progress Notes (Signed)
 Chief Complaint  Patient presents with   Wound Check    Patient is here for  F/U-Gangrene of toe of right foot (HCC) Foot is not looking better    HPI: 63 y.o. female presenting today for follow-up evaluation of ischemic gangrene to the toes RLE.  They have been applying Betadine daily.  Vascular HPI:  hx of angiogram with balloon angioplasty of the right TPT, PTA on 03/27/2023 by Dr. Rosalva Comber for blue toe syndrome.  She underwent angiogram with right EIA angioplasty and stent on 03/07/2023 for CLI with rest pain by Dr. Fulton Job.   Past Medical History:  Diagnosis Date   Atrial fibrillation with RVR (HCC)    Chronic combined systolic (congestive) and diastolic (congestive) heart failure (HCC)    Dyspnea    Goiter    Hypertension    NICM (nonischemic cardiomyopathy) (HCC)    a. 08/2005 Echo: EF 30-35%, mod diff HK. Mild to Mod MR. Mildly dil LA; b. 08/2005 Cath: Nl Cors. Elevated CO w/o shunt; c. 09/2007 Echo: EF 45%. Mild to mod MR; c. 03/2015 Echo: EF 45%, global HK. Gr1 DD. Mild MR. Mildly dil LA.    Past Surgical History:  Procedure Laterality Date   ABDOMINAL AORTOGRAM W/LOWER EXTREMITY N/A 03/07/2023   Procedure: ABDOMINAL AORTOGRAM W/LOWER EXTREMITY;  Surgeon: Young Hensen, MD;  Location: [facility] INVASIVE CV LAB;  Service: Cardiovascular;  Laterality: N/A;   ABDOMINAL AORTOGRAM W/LOWER EXTREMITY N/A 03/27/2023   Procedure: ABDOMINAL AORTOGRAM W/LOWER EXTREMITY;  Surgeon: Kayla Part, MD;  Location: [facility] INVASIVE CV LAB;  Service: Cardiovascular;  Laterality: N/A;   APPENDECTOMY     BACK SURGERY     CARDIOVERSION N/A 03/14/2018   Procedure: CARDIOVERSION;  Surgeon: Elmyra Haggard, MD;  Location: [facility] ENDOSCOPY;  Service: Cardiovascular;  Laterality: N/A;   PERIPHERAL VASCULAR BALLOON ANGIOPLASTY  03/27/2023   Procedure: PERIPHERAL VASCULAR BALLOON ANGIOPLASTY;  Surgeon: Kayla Part, MD;  Location: [facility] INVASIVE CV LAB;  Service: Cardiovascular;;  Right AT   TEE WITHOUT CARDIOVERSION  N/A 03/14/2018   Procedure: TRANSESOPHAGEAL ECHOCARDIOGRAM (TEE);  Surgeon: Elmyra Haggard, MD;  Location: [facility] ENDOSCOPY;  Service: Cardiovascular;  Laterality: N/A;   TEE WITHOUT CARDIOVERSION N/A 02/26/2022   Procedure: TRANSESOPHAGEAL ECHOCARDIOGRAM (TEE);  Surgeon: Alwin Baars, DO;  Location: [facility] ENDOSCOPY;  Service: Cardiovascular;  Laterality: N/A;    No Known Allergies     RT foot 04/17/2023     RT foot 05/01/2023    Physical Exam: General: The patient is alert and oriented x3 in no acute distress.  Dermatology: Continued stable gangrene to the digits 1-3 of the right foot.  No drainage.  No malodor.  Vascular: hx of angiogram with balloon angioplasty of the right TPT, PTA on 03/27/2023 by Dr. Rosalva Comber for blue toe syndrome.  She underwent angiogram with right EIA angioplasty and stent on 03/07/2023 for CLI with rest pain by Dr. Fulton Job.   Neurological: Diminished via light touch  Musculoskeletal Exam: No pedal deformity.  No prior amputation.  Minimally ambulatory secondary to pain  Assessment/Plan of Care: 1.  Critical limb ischemia  2.  Dry stable gangrene digits 1-3 right foot 3.  Pain due to onychomycosis of toenails bilateral  -Patient evaluated.   -Today we had a very frank and detailed discussion regarding the patient's condition.  Although the foot is stable at this time I do recommend transmetatarsal amputation of the foot to allow for clean incision and removal of the gangrenous digit.  This was discussed  in detail with the patient including the concern for peripheral vascular disease which may not allow for postoperative healing which would likely require a more proximal amputation.  Risk benefits advantages and disadvantages were discussed in great length in detail with the patient.  The postoperative recovery course was also explained.  They would like to go home and discuss and think about whether or not to pursue surgery at this time -In the meantime continue  Betadine wet-to-dry dressings daily. -Prescription for Percocet 5/325 mg as needed pain -Return to clinic 4 weeks      Joanna [name], DPM [facility]  Dr. Dot [name], DPM    [address] [place], [place] [address]                Office [phone]  Fax [phone]

## 2023-06-12 ENCOUNTER — Encounter: Payer: Self-pay | Admitting: Internal Medicine

## 2023-06-18 ENCOUNTER — Other Ambulatory Visit

## 2023-06-19 ENCOUNTER — Ambulatory Visit (INDEPENDENT_AMBULATORY_CARE_PROVIDER_SITE_OTHER): Admitting: Podiatry

## 2023-06-19 ENCOUNTER — Encounter: Payer: Self-pay | Admitting: Podiatry

## 2023-06-19 DIAGNOSIS — I96 Gangrene, not elsewhere classified: Secondary | ICD-10-CM | POA: Diagnosis not present

## 2023-06-19 MED ORDER — OXYCODONE-ACETAMINOPHEN 5-325 MG PO TABS: 1.0000 | ORAL_TABLET | Freq: Four times a day (QID) | ORAL | 0 refills | Status: DC | PRN

## 2023-06-19 NOTE — Progress Notes (Signed)
 Chief Complaint  Patient presents with   GANGRENE    RM#6 presenting today for follow-up evaluation of ischemic gangrene to the toes right foot first three toes.    HPI: 63 y.o. female presenting today for follow-up evaluation of ischemic gangrene to the toes RLE.  Patient states that she discontinued the Betadine and is now applying religious 'holy oil' to the toes and prying over them.  She believes there is improvement  Vascular HPI:  hx of angiogram with balloon angioplasty of the right TPT, PTA on 03/27/2023 by Dr. Rosalva Comber for blue toe syndrome.  She underwent angiogram with right EIA angioplasty and stent on 03/07/2023 for CLI with rest pain by Dr. Fulton Job.   Past Medical History:  Diagnosis Date   Atrial fibrillation with RVR (HCC)    Chronic combined systolic (congestive) and diastolic (congestive) heart failure (HCC)    Dyspnea    Goiter    Hypertension    NICM (nonischemic cardiomyopathy) (HCC)    a. 08/2005 Echo: EF 30-35%, mod diff HK. Mild to Mod MR. Mildly dil LA; b. 08/2005 Cath: Nl Cors. Elevated CO w/o shunt; c. 09/2007 Echo: EF 45%. Mild to mod MR; c. 03/2015 Echo: EF 45%, global HK. Gr1 DD. Mild MR. Mildly dil LA.    Past Surgical History:  Procedure Laterality Date   ABDOMINAL AORTOGRAM W/LOWER EXTREMITY N/A 03/07/2023   Procedure: ABDOMINAL AORTOGRAM W/LOWER EXTREMITY;  Surgeon: Young Hensen, MD;  Location: [facility] INVASIVE CV LAB;  Service: Cardiovascular;  Laterality: N/A;   ABDOMINAL AORTOGRAM W/LOWER EXTREMITY N/A 03/27/2023   Procedure: ABDOMINAL AORTOGRAM W/LOWER EXTREMITY;  Surgeon: Kayla Part, MD;  Location: [facility] INVASIVE CV LAB;  Service: Cardiovascular;  Laterality: N/A;   APPENDECTOMY     BACK SURGERY     CARDIOVERSION N/A 03/14/2018   Procedure: CARDIOVERSION;  Surgeon: Elmyra Haggard, MD;  Location: [facility] ENDOSCOPY;  Service: Cardiovascular;  Laterality: N/A;   PERIPHERAL VASCULAR BALLOON ANGIOPLASTY  03/27/2023   Procedure: PERIPHERAL VASCULAR BALLOON  ANGIOPLASTY;  Surgeon: Kayla Part, MD;  Location: [facility] INVASIVE CV LAB;  Service: Cardiovascular;;  Right AT   TEE WITHOUT CARDIOVERSION N/A 03/14/2018   Procedure: TRANSESOPHAGEAL ECHOCARDIOGRAM (TEE);  Surgeon: Elmyra Haggard, MD;  Location: [facility] ENDOSCOPY;  Service: Cardiovascular;  Laterality: N/A;   TEE WITHOUT CARDIOVERSION N/A 02/26/2022   Procedure: TRANSESOPHAGEAL ECHOCARDIOGRAM (TEE);  Surgeon: Alwin Baars, DO;  Location: [facility] ENDOSCOPY;  Service: Cardiovascular;  Laterality: N/A;    No Known Allergies     RT foot 04/17/2023     RT foot 05/01/2023    Physical Exam: General: The patient is alert and oriented x3 in no acute distress.  Dermatology: Continued stable gangrene to the digits 1-3 of the right foot.  No drainage.  No malodor.  Vascular: hx of angiogram with balloon angioplasty of the right TPT, PTA on 03/27/2023 by Dr. Rosalva Comber for blue toe syndrome.  She underwent angiogram with right EIA angioplasty and stent on 03/07/2023 for CLI with rest pain by Dr. Fulton Job.   Neurological: Diminished via light touch  Musculoskeletal Exam: No pedal deformity.  No prior amputation.  Minimally ambulatory secondary to pain  Assessment/Plan of Care: 1.  Critical limb ischemia  2.  Stable gangrene digits 1-3 right foot   -Patient evaluated.   - Again today we had a very frank and detailed discussion regarding the patient's condition.  The foot continues to be stable at this time.  The patient would not like to proceed with  surgery at this time.  We will continue with observation and see if the toes are able to autoamputate -In the meantime continue Betadine wet-to-dry dressings daily.  Advised against applying religious holy oil to the toes -Prescription for Percocet 5/325 mg as needed pain -Follow-up with Dr. Rosalva Comber, [facility] tomorrow, 06/20/2023 -Return to clinic 4 weeks      Dot Gazella, DPM [facility]  Dr. Dot Gazella, DPM    [address] [place], [place] [address]                Office [phone]  Fax [phone]

## 2023-06-20 ENCOUNTER — Ambulatory Visit (HOSPITAL_COMMUNITY)

## 2023-06-20 ENCOUNTER — Ambulatory Visit: Admitting: Vascular Surgery

## 2023-06-27 ENCOUNTER — Encounter (HOSPITAL_COMMUNITY): Admitting: Cardiology

## 2023-07-02 ENCOUNTER — Other Ambulatory Visit (HOSPITAL_COMMUNITY): Payer: Self-pay | Admitting: Cardiology

## 2023-07-02 DIAGNOSIS — I5022 Chronic systolic (congestive) heart failure: Secondary | ICD-10-CM

## 2023-07-15 ENCOUNTER — Other Ambulatory Visit (HOSPITAL_COMMUNITY): Payer: Self-pay | Admitting: *Deleted

## 2023-07-15 MED ORDER — DOFETILIDE 125 MCG PO CAPS
125.0000 ug | ORAL_CAPSULE | Freq: Two times a day (BID) | ORAL | 3 refills | Status: DC
Start: 2023-07-15 — End: 2023-11-12

## 2023-07-16 NOTE — Progress Notes (Signed)
 [facility] CL[facility] NOTE  Referring Physician: Lawrance Presume, MD  Primary Care: Joanna Presume, MD Primary Cardiologist: Joanna [name]  CC: Heart failure with recovered EF  HPI: Joanna [name] is a 63 y.o. female with hypertension, long-term persistent atrial fibrillation and nonischemic cardiomyopathy with EF as low as 20 to 25% presenting today for follow up. [name] reports being diagnosed with heart failure for at least 10 years now with cath in 2007 demonstrating normal coronaries. She has had several admissions at [name] dating back to 2020 when she required intubation for hypertensive urgency associated with pulmonary edema and atrial fibrillation. Most recently she was admitted in November 2023 due to decompensated heart failure believed to be brought on by atrial fibrillation from amiodarone  induced thyrotoxicosis. During that admission, TTE w/ LVEF 25%-30% & severely dilated LA w/ severe eccentric MR. Since that time she has been seen in our [name] clinic with addition of some GDMT.   Interval hx:  - Doing very well from a cardiac standpoint. - She unfortunately has severe pain from distal critical limb ischemia with gangrene of the first third right foot digits.  Seen by [name] today at the [name].  Patient at this time wishes for amputation due to severe intractable pain. - During our visit, patient complaining of severe pain and in tears.  Unable to provide any further history or talk to me.  History obtained largely from family at bedside.    Current Outpatient Medications  Medication Sig Dispense Refill   atorvastatin  (LIPITOR) 40 MG tablet Take 1 tablet (40 mg total) by mouth daily. 90 tablet 2   carvedilol  (COREG ) 6.25 MG tablet Take 6.25 mg by mouth 2 (two) times daily with a meal.     clopidogrel  (PLAVIX ) 75 MG tablet Take 1 tablet (75 mg total) by mouth daily. 90 tablet 4   ELIQUIS  5 MG TABS tablet TAKE 1 TABLET BY  MOUTH TWICE  DAILY 200 tablet 2   feeding supplement (ENSURE IMMUNE HEALTH) LIQD Take 237 mLs by mouth 3 (three) times daily with meals.     furosemide  (LASIX ) 40 MG tablet Take 40 mg by mouth daily.     oxyCODONE -acetaminophen  (PERCOCET/ROXICET) 5-325 MG tablet Take 1 tablet by mouth every 6 (six) hours as needed for severe pain (pain score 7-10). 30 tablet 0   potassium chloride  (KLOR-CON ) 10 MEQ tablet TAKE 1 TABLET(10 MEQ) BY MOUTH DAILY 90 tablet 3   spironolactone  (ALDACTONE ) 25 MG tablet Take 0.5 tablets (12.5 mg total) by mouth daily. 45 tablet 3   dofetilide  (TIKOSYN ) 125 MCG capsule Take 1 capsule (125 mcg total) by mouth 2 (two) times daily. (Patient not taking: Reported on 07/17/2023) 60 capsule 3   methimazole  (TAPAZOLE ) 5 MG tablet Take 1 tablet (5 mg total) by mouth daily. (Patient not taking: Reported on 07/17/2023) 90 tablet 3   No current facility-administered medications for this encounter.    PHYSICAL EXAM: Vitals:   07/17/23 1435  BP: 138/76  Pulse: 64  SpO2: 100%   GENERAL: Distressed, crying Lungs-normal work of breathing CARDIAC:  JVP: 5 cm          Normal rate with regular rhythm.  No murmur.  Pulses 2+.  No edema.  ABDOMEN: Soft, non-tender, non-distended.  EXTREMITIES: Warm and well perfused.   DATA REVIEW  [id number] fibrillation 02/12/21: Normal sinus rhythm  ECHO: 12/21/21: LVEF 25% to 30%. Severe eccentric MR.  10/04/20: LVEF 55% - 60%  TEE: 02/26/22: LVEF 35%-40%, mild-mod MR. 02/25/23: LVEF 40-45% with septal hypokinesis.    ASSESSMENT & PLAN:  NYHA IIB, Stage C-D, Systolic Heart Failure Etiology of FA:OZHYQM nonischemic from persistent atrial fibrillation NYHA class / AHA Stage:IIB Volume status & Diuretics: Euvolemic, decrease to 20mg  daily. Vasodilators:Entresto  97/103mg  BID Beta-Blocker: continue coreg  6.25mg  BID; limited previously by bradycardia. MRA:spironolactone  12.5mg  daily Cardiometabolic:continue farxiga  10mg  daily Devices therapies  & Valvulopathies: Followed with EP, now on dofetilide  for rhythm control.  Improvement in MR after rhythm control. Repeat TTE now. I suspect LVEF has continued to improve; most recent TTE in 02/25/23 with improvement to 45%.  Will repeat TTE prior to amputation Advanced therapies:N/A  2. Severe mitral regurgitation  - Severe LA enlargement, likely atrial functional MR - Repeat TEE with improvement in MR after maintaining NSR; now mild-moderate MR - Repeat TTE pending  3. Persistent atrial fibrillation  -Now on Tikosyn , followed by Dr. Lawana [name].   - Regular rate on my exam - apixaban  5mg  BID.   4. Amiodarone  induced thyrotoxicosis - Follow with endocrinology  - Off amiodarone  since 10/16/21 - s/p methimazole  & prednisone   5. Severe low back pain / right leg pain  - Reports that she is unable to walk > 29ft due to 10/10pain sharp pain radiating down the RLE.  - She has now been seen by orthopedic surgery; based on review she needs total hip replacement. Will discuss with them at her follow up.   6. PAD - Plavix  75mg  daily  - She is in severe pain today due to critical limb ischemia/gangrene.  Seen by vascular surgery today.  Wishes to proceed with transmetatarsal amputation.  Echocardiogram scheduled for July 23, 2023  [name] Mechanical Circulatory Support

## 2023-07-17 ENCOUNTER — Ambulatory Visit (HOSPITAL_COMMUNITY)
Admission: RE | Admit: 2023-07-17 | Discharge: 2023-07-17 | Disposition: A | Discharge status: Home / Self Care | Source: Ambulatory Visit | Attending: Cardiology | Admitting: Cardiology

## 2023-07-17 ENCOUNTER — Ambulatory Visit (INDEPENDENT_AMBULATORY_CARE_PROVIDER_SITE_OTHER): Admitting: Podiatry

## 2023-07-17 ENCOUNTER — Encounter: Payer: Self-pay | Admitting: Podiatry

## 2023-07-17 ENCOUNTER — Encounter (HOSPITAL_COMMUNITY): Payer: Self-pay | Admitting: Cardiology

## 2023-07-17 VITALS — BP 138/76 | HR 64 | Wt 133.8 lb

## 2023-07-17 VITALS — Ht 65.0 in | Wt 136.0 lb

## 2023-07-17 DIAGNOSIS — M79671 Pain in right foot: Secondary | ICD-10-CM | POA: Insufficient documentation

## 2023-07-17 DIAGNOSIS — I70268 Atherosclerosis of native arteries of extremities with gangrene, other extremity: Secondary | ICD-10-CM | POA: Diagnosis not present

## 2023-07-17 DIAGNOSIS — I5022 Chronic systolic (congestive) heart failure: Secondary | ICD-10-CM | POA: Diagnosis present

## 2023-07-17 DIAGNOSIS — Z7902 Long term (current) use of antithrombotics/antiplatelets: Secondary | ICD-10-CM | POA: Diagnosis not present

## 2023-07-17 DIAGNOSIS — I428 Other cardiomyopathies: Secondary | ICD-10-CM | POA: Diagnosis not present

## 2023-07-17 DIAGNOSIS — I70238 Atherosclerosis of native arteries of right leg with ulceration of other part of lower right leg: Secondary | ICD-10-CM | POA: Diagnosis not present

## 2023-07-17 DIAGNOSIS — Z79899 Other long term (current) drug therapy: Secondary | ICD-10-CM | POA: Diagnosis not present

## 2023-07-17 DIAGNOSIS — I480 Paroxysmal atrial fibrillation: Secondary | ICD-10-CM | POA: Diagnosis not present

## 2023-07-17 DIAGNOSIS — I4819 Other persistent atrial fibrillation: Secondary | ICD-10-CM | POA: Diagnosis not present

## 2023-07-17 DIAGNOSIS — Z7984 Long term (current) use of oral hypoglycemic drugs: Secondary | ICD-10-CM | POA: Diagnosis not present

## 2023-07-17 DIAGNOSIS — I96 Gangrene, not elsewhere classified: Secondary | ICD-10-CM

## 2023-07-17 DIAGNOSIS — M51372 Other intervertebral disc degeneration, lumbosacral region with discogenic back pain and lower extremity pain: Secondary | ICD-10-CM | POA: Diagnosis not present

## 2023-07-17 DIAGNOSIS — I340 Nonrheumatic mitral (valve) insufficiency: Secondary | ICD-10-CM | POA: Insufficient documentation

## 2023-07-17 DIAGNOSIS — I110 Hypertensive heart disease with heart failure: Secondary | ICD-10-CM | POA: Insufficient documentation

## 2023-07-17 DIAGNOSIS — Z7901 Long term (current) use of anticoagulants: Secondary | ICD-10-CM | POA: Insufficient documentation

## 2023-07-17 MED ORDER — OXYCODONE-ACETAMINOPHEN 5-325 MG PO TABS: 1.0000 | ORAL_TABLET | Freq: Four times a day (QID) | ORAL | 0 refills | Status: DC | PRN

## 2023-07-17 NOTE — Patient Instructions (Signed)
 Medication Changes:  No Changes In Medications at this time.   Echocardiogram as scheduled   Follow-Up in: 3 months as scheduled   At the [facility], you and your health needs are our priority. We have a designated team specialized in the treatment of Heart Failure. This Care Team includes your primary Heart Failure Specialized Cardiologist (physician), Advanced Practice Providers (APPs- Physician Assistants and Nurse Practitioners), and Pharmacist who all work together to provide you with the care you need, when you need it.   You may see any of the following providers on your designated Care Team at your next follow up:  Dr. Jules Oar Dr. Peder Bourdon Dr. Alwin Baars Dr. Judyth Nunnery Nieves Bars, NP Ruddy Corral, [place] [facility] [place], [place] Dennise Fitz, NP [place] Lee, NP Luster Salters, PharmD   Please be sure to bring in all your medications bottles to every appointment.   Need to Contact Us :  If you have any questions or concerns before your next appointment please send us  a message through [place] or call our office at [phone].    TO LEAVE A MESSAGE FOR THE NURSE SELECT OPTION 2, PLEASE LEAVE A MESSAGE INCLUDING: YOUR NAME DATE OF BIRTH CALL BACK NUMBER REASON FOR CALL**this is important as we prioritize the call backs  YOU WILL RECEIVE A CALL BACK THE SAME DAY AS LONG AS YOU CALL BEFORE 4:00 PM

## 2023-07-17 NOTE — Progress Notes (Signed)
 Chief Complaint  Patient presents with   Toe Injury    Pt is here to f/u on right toes due to gangrene, pt states she is ready for the toes to comes, keeps toes clean with betadine, states the pain is sometime unbearable.    HPI: 63 y.o. female presenting today for follow-up evaluation of ischemic gangrene to the toes RLE.  Today upon presentation the patient informed me that she is ready to have surgery to have the toes removed.  They continue to be painful and she has begun to notice an odor coming from the toes.  Vascular HPI:  hx of angiogram with balloon angioplasty of the right TPT, PTA on 03/27/2023 by Dr. Rosalva Comber for blue toe syndrome.  She underwent angiogram with right EIA angioplasty and stent on 03/07/2023 for CLI with rest pain by Dr. Fulton Job.   Past Medical History:  Diagnosis Date   Atrial fibrillation with RVR (HCC)    Chronic combined systolic (congestive) and diastolic (congestive) heart failure (HCC)    Dyspnea    Goiter    Hypertension    NICM (nonischemic cardiomyopathy) (HCC)    a. 08/2005 Echo: EF 30-35%, mod diff HK. Mild to Mod MR. Mildly dil LA; b. 08/2005 Cath: Nl Cors. Elevated CO w/o shunt; c. 09/2007 Echo: EF 45%. Mild to mod MR; c. 03/2015 Echo: EF 45%, global HK. Gr1 DD. Mild MR. Mildly dil LA.    Past Surgical History:  Procedure Laterality Date   ABDOMINAL AORTOGRAM W/LOWER EXTREMITY N/A 03/07/2023   Procedure: ABDOMINAL AORTOGRAM W/LOWER EXTREMITY;  Surgeon: Young Hensen, MD;  Location: [facility] INVASIVE CV LAB;  Service: Cardiovascular;  Laterality: N/A;   ABDOMINAL AORTOGRAM W/LOWER EXTREMITY N/A 03/27/2023   Procedure: ABDOMINAL AORTOGRAM W/LOWER EXTREMITY;  Surgeon: Kayla Part, MD;  Location: [facility] INVASIVE CV LAB;  Service: Cardiovascular;  Laterality: N/A;   APPENDECTOMY     BACK SURGERY     CARDIOVERSION N/A 03/14/2018   Procedure: CARDIOVERSION;  Surgeon: Elmyra Haggard, MD;  Location: [facility] ENDOSCOPY;  Service: Cardiovascular;  Laterality: N/A;    PERIPHERAL VASCULAR BALLOON ANGIOPLASTY  03/27/2023   Procedure: PERIPHERAL VASCULAR BALLOON ANGIOPLASTY;  Surgeon: Kayla Part, MD;  Location: [facility] INVASIVE CV LAB;  Service: Cardiovascular;;  Right AT   TEE WITHOUT CARDIOVERSION N/A 03/14/2018   Procedure: TRANSESOPHAGEAL ECHOCARDIOGRAM (TEE);  Surgeon: Elmyra Haggard, MD;  Location: [facility] ENDOSCOPY;  Service: Cardiovascular;  Laterality: N/A;   TEE WITHOUT CARDIOVERSION N/A 02/26/2022   Procedure: TRANSESOPHAGEAL ECHOCARDIOGRAM (TEE);  Surgeon: Alwin Baars, DO;  Location: [facility] ENDOSCOPY;  Service: Cardiovascular;  Laterality: N/A;    No Known Allergies     RT foot 04/17/2023     RT foot 05/01/2023    Physical Exam: General: The patient is alert and oriented x3 in no acute distress.  Dermatology: Continued stable gangrene to the digits 1-3 of the right foot.  No drainage.  Today there does not seem to be much malodor.  Vascular: hx of angiogram with balloon angioplasty of the right TPT, PTA on 03/27/2023 by Dr. Rosalva Comber for blue toe syndrome.  She underwent angiogram with right EIA angioplasty and stent on 03/07/2023 for CLI with rest pain by Dr. Fulton Job.   Neurological: Diminished via light touch  Musculoskeletal Exam: No pedal deformity.  No prior amputation.  Minimally ambulatory secondary to pain  Assessment/Plan of Care: 1.  Critical limb ischemia  2.  Stable gangrene digits 1-3 right foot   -Patient evaluated.   -  Today we discussed transmetatarsal amputation of the right foot.  I do believe it is appropriate at this time.  She does not want to continue conservative care and allow for autoamputation.  The risks associated with transmetatarsal amputation were explained to the patient.  She understands that she has minimal blood flow and healing is dependent on adequate perfusion of the foot.  Risk benefits advantages and disadvantages of the procedure were explained in detail in length.  Postoperative recovery was also explained.   All patient questions answered.  No guarantees were expressed or implied.  She consents and would like to proceed with surgery -Authorization for surgery was initiated today.  Surgery will consist of transmetatarsal amputation right foot performed at [facility] -Discontinue Plavix  1 week prior to surgery -Refill prescription for Percocet 5/325 mg as needed pain - Return to clinic 1 week postop      Dot Gazella, DPM [facility]  Dr. Dot Gazella, DPM    [address] [place], [place] [address]                Office [phone]  Fax [phone]

## 2023-07-23 ENCOUNTER — Ambulatory Visit (HOSPITAL_COMMUNITY)
Admission: RE | Admit: 2023-07-23 | Discharge: 2023-07-23 | Discharge status: Home / Self Care | Source: Ambulatory Visit | Attending: Internal Medicine | Admitting: Internal Medicine

## 2023-07-23 DIAGNOSIS — I5022 Chronic systolic (congestive) heart failure: Secondary | ICD-10-CM | POA: Diagnosis present

## 2023-07-23 DIAGNOSIS — I081 Rheumatic disorders of both mitral and tricuspid valves: Secondary | ICD-10-CM | POA: Diagnosis not present

## 2023-07-23 LAB — ECHOCARDIOGRAM COMPLETE
AR max vel: 2.07 cm2
AV Area VTI: 1.7 cm2
AV Area mean vel: 1.84 cm2
AV Mean grad: 4 mmHg
AV Peak grad: 7.6 mmHg
Ao pk vel: 1.38 m/s
Area-P 1/2: 2.18 cm2
S' Lateral: 3.9 cm

## 2023-07-25 ENCOUNTER — Other Ambulatory Visit (HOSPITAL_COMMUNITY): Payer: Self-pay | Admitting: Cardiology

## 2023-07-25 ENCOUNTER — Other Ambulatory Visit: Payer: Self-pay | Admitting: Vascular Surgery

## 2023-07-25 DIAGNOSIS — I96 Gangrene, not elsewhere classified: Secondary | ICD-10-CM

## 2023-07-25 DIAGNOSIS — I70238 Atherosclerosis of native arteries of right leg with ulceration of other part of lower right leg: Secondary | ICD-10-CM

## 2023-07-29 ENCOUNTER — Other Ambulatory Visit (HOSPITAL_COMMUNITY): Payer: Self-pay | Admitting: Cardiology

## 2023-08-06 ENCOUNTER — Telehealth: Payer: Self-pay | Admitting: Podiatry

## 2023-08-06 NOTE — Telephone Encounter (Signed)
 Pt req refill of oxyCODONE -acetaminophen  (PERCOCET/ROXICET) 5-325 MG tablet  states that she is in a lot of pain.

## 2023-08-07 ENCOUNTER — Other Ambulatory Visit: Payer: Self-pay | Admitting: Podiatry

## 2023-08-07 MED ORDER — OXYCODONE-ACETAMINOPHEN 5-325 MG PO TABS: 1.0000 | ORAL_TABLET | Freq: Four times a day (QID) | ORAL | 0 refills | Status: DC | PRN

## 2023-08-07 NOTE — H&P (View-Only) (Signed)
 HISTORY AND PHYSICAL     CC:  follow up. Requesting Provider:  Vicci Barnie NOVAK, MD  HPI: This is a 63 y.o. female who is here today for follow up for PAD.  Pt has hx of angiogram with balloon angioplasty of the right TPT, PTA on 03/27/2023 by Dr. Lanis for blue toe syndrome.  She underwent angiogram with right EIA angioplasty and stent on 03/07/2023 for CLI with rest pain by Dr. Gretta.  The pt returns today for follow up and here with her sister.  Toes have continued to demarcate.  They have been followed in the outpatient setting with Dr. Janit, and was last seen on 6/11 with plans for TMA.  TMA is scheduled for the 18th of this month.  She continues to have significant pain.  Denies fever, denies drainage.  The pt is on a statin for cholesterol management.    The pt is not on an aspirin .    Other AC:  Plavix /Eliquis  The pt is on diuretic for hypertension.  The pt is not on medication for diabetes. Tobacco hx:  current    Past Medical History:  Diagnosis Date   Atrial fibrillation with RVR (HCC)    Chronic combined systolic (congestive) and diastolic (congestive) heart failure (HCC)    Dyspnea    Goiter    Hypertension    NICM (nonischemic cardiomyopathy) (HCC)    a. 08/2005 Echo: EF 30-35%, mod diff HK. Mild to Mod MR. Mildly dil LA; b. 08/2005 Cath: Nl Cors. Elevated CO w/o shunt; c. 09/2007 Echo: EF 45%. Mild to mod MR; c. 03/2015 Echo: EF 45%, global HK. Gr1 DD. Mild MR. Mildly dil LA.    Past Surgical History:  Procedure Laterality Date   ABDOMINAL AORTOGRAM W/LOWER EXTREMITY N/A 03/07/2023   Procedure: ABDOMINAL AORTOGRAM W/LOWER EXTREMITY;  Surgeon: Gretta Lonni PARAS, MD;  Location: [facility] INVASIVE CV LAB;  Service: Cardiovascular;  Laterality: N/A;   ABDOMINAL AORTOGRAM W/LOWER EXTREMITY N/A 03/27/2023   Procedure: ABDOMINAL AORTOGRAM W/LOWER EXTREMITY;  Surgeon: Lanis Fonda BRAVO, MD;  Location: [facility] INVASIVE CV LAB;  Service: Cardiovascular;  Laterality: N/A;   APPENDECTOMY      BACK SURGERY     CARDIOVERSION N/A 03/14/2018   Procedure: CARDIOVERSION;  Surgeon: Okey Vina GAILS, MD;  Location: [facility] ENDOSCOPY;  Service: Cardiovascular;  Laterality: N/A;   PERIPHERAL VASCULAR BALLOON ANGIOPLASTY  03/27/2023   Procedure: PERIPHERAL VASCULAR BALLOON ANGIOPLASTY;  Surgeon: Lanis Fonda BRAVO, MD;  Location: [facility] INVASIVE CV LAB;  Service: Cardiovascular;;  Right AT   TEE WITHOUT CARDIOVERSION N/A 03/14/2018   Procedure: TRANSESOPHAGEAL ECHOCARDIOGRAM (TEE);  Surgeon: Okey Vina GAILS, MD;  Location: [facility] ENDOSCOPY;  Service: Cardiovascular;  Laterality: N/A;   TEE WITHOUT CARDIOVERSION N/A 02/26/2022   Procedure: TRANSESOPHAGEAL ECHOCARDIOGRAM (TEE);  Surgeon: Gardenia Led, DO;  Location: [facility] ENDOSCOPY;  Service: Cardiovascular;  Laterality: N/A;    No Known Allergies  Current Outpatient Medications  Medication Sig Dispense Refill   atorvastatin  (LIPITOR) 40 MG tablet Take 1 tablet (40 mg total) by mouth daily. 90 tablet 2   carvedilol  (COREG ) 6.25 MG tablet TAKE 1 TABLET(6.25 MG) BY MOUTH TWICE DAILY 180 tablet 0   clopidogrel  (PLAVIX ) 75 MG tablet Take 1 tablet (75 mg total) by mouth daily. 90 tablet 4   dofetilide  (TIKOSYN ) 125 MCG capsule Take 1 capsule (125 mcg total) by mouth 2 (two) times daily. (Patient not taking: Reported on 07/17/2023) 60 capsule 3   ELIQUIS  5 MG TABS tablet TAKE 1 TABLET BY MOUTH  TWICE  DAILY 200 tablet 2   feeding supplement (ENSURE IMMUNE HEALTH) LIQD Take 237 mLs by mouth 3 (three) times daily with meals.     furosemide  (LASIX ) 40 MG tablet Take 40 mg by mouth daily.     methimazole  (TAPAZOLE ) 5 MG tablet Take 1 tablet (5 mg total) by mouth daily. (Patient not taking: Reported on 07/17/2023) 90 tablet 3   oxyCODONE -acetaminophen  (PERCOCET/ROXICET) 5-325 MG tablet Take 1 tablet by mouth every 6 (six) hours as needed for severe pain (pain score 7-10). 30 tablet 0   potassium chloride  (KLOR-CON ) 10 MEQ tablet TAKE 1 TABLET(10 MEQ) BY MOUTH DAILY 90 tablet 3    spironolactone  (ALDACTONE ) 25 MG tablet Take 0.5 tablets (12.5 mg total) by mouth daily. 45 tablet 3   No current facility-administered medications for this visit.    Family History  Problem Relation Age of Onset   Cancer Mother    Heart disease Father     Social History   Socioeconomic History   Marital status: Single    Spouse name: Not on file   Number of children: 1   Years of education: Not on file   Highest education level: High school graduate  Occupational History   Occupation: unemployed    Comment: on disablity  Tobacco Use   Smoking status: Every Day    Current packs/day: 0.00    Average packs/day: 2.0 packs/day for 30.0 years (60.0 ttl pk-yrs)    Types: Cigarettes    Start date: 02/09/1988    Last attempt to quit: 02/08/2018    Years since quitting: 5.4   Smokeless tobacco: Never   Tobacco comments:    3 cigarettes daily 02/09/22  Vaping Use   Vaping status: Never Used  Substance and Sexual Activity   Alcohol use: No    Comment: quit 2 years ago   Drug use: No   Sexual activity: Not Currently  Other Topics Concern   Not on file  Social History Narrative   Not on file   Social Drivers of Health   Financial Resource Strain: Low Risk  (07/17/2022)   Overall Financial Resource Strain (CARDIA)    Difficulty of Paying Living Expenses: Not hard at all  Food Insecurity: No Food Insecurity (03/04/2023)   Hunger Vital Sign    Worried About Running Out of Food in the Last Year: Never true    Ran Out of Food in the Last Year: Never true  Transportation Needs: Unmet Transportation Needs (03/04/2023)   PRAPARE - Administrator, Civil Service (Medical): Yes    Lack of Transportation (Non-Medical): No  Physical Activity: Insufficiently Active (07/17/2022)   Exercise Vital Sign    Days of Exercise per Week: 3 days    Minutes of Exercise per Session: 30 min  Stress: No Stress Concern Present (07/17/2022)   [employer] of Occupational Health -  Occupational Stress Questionnaire    Feeling of Stress : Not at all  Social Connections: Moderately Isolated (03/04/2023)   Social Connection and Isolation Panel    Frequency of Communication with Friends and Family: More than three times a week    Frequency of Social Gatherings with Friends and Family: Three times a week    Attends Religious Services: More than 4 times per year    Active Member of Clubs or Organizations: No    Attends Banker Meetings: Never    Marital Status: Never married  Intimate Partner Violence: Not At Risk (03/04/2023)  Humiliation, Afraid, Rape, and Kick questionnaire    Fear of Current or Ex-Partner: No    Emotionally Abused: No    Physically Abused: No    Sexually Abused: No     REVIEW OF SYSTEMS:   [X]  denotes positive finding, [ ]  denotes negative finding Cardiac  Comments:  Chest pain or chest pressure:    Shortness of breath upon exertion:    Short of breath when lying flat:    Irregular heart rhythm:        Vascular    Pain in calf, thigh, or hip brought on by ambulation:    Pain in feet at night that wakes you up from your sleep:     Blood clot in your veins:    Leg swelling:         Pulmonary    Oxygen at home:    Productive cough:     Wheezing:          Neurologic    Sudden weakness in arms or legs:     Sudden numbness in arms or legs:     Sudden onset of difficulty speaking or slurred speech:    Temporary loss of vision in one eye:     Problems with dizziness:         Gastrointestinal    Blood in stool:     Vomited blood:         Genitourinary    Burning when urinating:     Blood in urine:        Psychiatric    Major depression:         Hematologic    Bleeding problems:    Problems with blood clotting too easily:        Skin    Rashes or ulcers: x       Constitutional    Fever or chills:      PHYSICAL EXAMINATION:  There were no vitals filed for this visit.  There is no height or weight on file to  calculate BMI.   General:  WDWN in NAD; vital signs documented above Gait: Not observed HENT: WNL, normocephalic Pulmonary: normal non-labored breathing , without wheezing Cardiac: irregular HR, without carotid bruits Skin: without rashes Vascular Exam/Pulses:  Right Left  Radial 2+ (normal) 2+ (normal)  DP monophasic multiphasic  PT monophasic multiphasic   Extremities: ischemic changes to toes on right  Toes of demarcate is much as possible.   Musculoskeletal: no muscle wasting or atrophy  Neurologic: A&O X 3 Psychiatric:  The pt has Normal affect.    ABI Findings:  +---------+------------------+-----+----------+----------+  Right   Rt Pressure (mmHg)IndexWaveform  Comment     +---------+------------------+-----+----------+----------+  Brachial 127                                          +---------+------------------+-----+----------+----------+  PTA     112               0.88 monophasic            +---------+------------------+-----+----------+----------+  PERO    119               0.94 monophasic            +---------+------------------+-----+----------+----------+  DP      120               0.94 monophasic            +---------+------------------+-----+----------+----------+  Great Toe                                 gangrenous  +---------+------------------+-----+----------+----------+   +---------+------------------+-----+--------+-------+  Left    Lt Pressure (mmHg)IndexWaveformComment  +---------+------------------+-----+--------+-------+  Brachial 123                                     +---------+------------------+-----+--------+-------+  PTA     159               1.25 biphasic         +---------+------------------+-----+--------+-------+  DP      131               1.03 biphasic         +---------+------------------+-----+--------+-------+  Great Toe77                0.61 Abnormal          +---------+------------------+-----+--------+-------+   +-------+-----------+-----------+------------+------------+  ABI/TBIToday's ABIToday's TBIPrevious ABIPrevious TBI  +-------+-----------+-----------+------------+------------+  Right .94                   1.23                      +-------+-----------+-----------+------------+------------+  Left  1.25       .61        .15         .52           +-------+-----------+-----------+------------+------------+    Summary:  Right: 50-74% stenosis noted in the superficial femoral artery. 75-99%  stenosis noted in the posterior tibial artery.     ASSESSMENT/PLAN:: 63 y.o. female here for follow up for PAD with hx of angiogram with balloon angioplasty of the right TPT, PTA on 03/27/2023 by Dr. Lanis for blue toe syndrome.  She underwent angiogram with right EIA angioplasty and stent on 03/07/2023 for CLI with rest pain by Dr. Gretta  Patient continues to have significant pain in the right foot.  While there is dry gangrene, there is wet tissue below. ABI demonstrates a significant decrease in the right lower extremity since last seen. Arterial duplex study demonstrates 2 areas of focal, high-grade stenosis which will inevitably limit perfusion to the foot.  I am worried that the stenosis appreciated in the superficial femoral artery and posterior tibial artery may inhibit wound healing.  I discussed this at length with Heron.  After discussing the risks and benefits of repeat right lower extremity angiogram in an effort to define and improve distal perfusion for wound healing she elected to proceed.  My plan would be to admit her after her angiogram with plans for definitive TMA as she has continued pain and is unable to sleep.  I will ensure Dr. Janit is aware of her visit and will try to coordinate scheduling with him.  Fonda FORBES [facility] [phone]

## 2023-08-07 NOTE — Progress Notes (Unsigned)
 HISTORY AND PHYSICAL     CC:  follow up. Requesting Provider:  Vicci Barnie NOVAK, MD  HPI: This is a 63 y.o. female who is here today for follow up for PAD.  Pt has hx of angiogram with balloon angioplasty of the right TPT, PTA on 03/27/2023 by Dr. Lanis for blue toe syndrome.  She underwent angiogram with right EIA angioplasty and stent on 03/07/2023 for CLI with rest pain by Dr. Gretta.  The pt returns today for follow up and here with her sister.  Toes have continued to demarcate.  They have been followed in the outpatient setting with Dr. Janit, and was last seen on 6/11 with plans for TMA.  TMA is scheduled for the 18th of this month.  She continues to have significant pain.  Denies fever, denies drainage.  The pt is on a statin for cholesterol management.    The pt is not on an aspirin .    Other AC:  Plavix /Eliquis  The pt is on diuretic for hypertension.  The pt is not on medication for diabetes. Tobacco hx:  current    Past Medical History:  Diagnosis Date   Atrial fibrillation with RVR (HCC)    Chronic combined systolic (congestive) and diastolic (congestive) heart failure (HCC)    Dyspnea    Goiter    Hypertension    NICM (nonischemic cardiomyopathy) (HCC)    a. 08/2005 Echo: EF 30-35%, mod diff HK. Mild to Mod MR. Mildly dil LA; b. 08/2005 Cath: Nl Cors. Elevated CO w/o shunt; c. 09/2007 Echo: EF 45%. Mild to mod MR; c. 03/2015 Echo: EF 45%, global HK. Gr1 DD. Mild MR. Mildly dil LA.    Past Surgical History:  Procedure Laterality Date   ABDOMINAL AORTOGRAM W/LOWER EXTREMITY N/A 03/07/2023   Procedure: ABDOMINAL AORTOGRAM W/LOWER EXTREMITY;  Surgeon: Gretta Lonni PARAS, MD;  Location: [facility] INVASIVE CV LAB;  Service: Cardiovascular;  Laterality: N/A;   ABDOMINAL AORTOGRAM W/LOWER EXTREMITY N/A 03/27/2023   Procedure: ABDOMINAL AORTOGRAM W/LOWER EXTREMITY;  Surgeon: Lanis Fonda BRAVO, MD;  Location: [facility] INVASIVE CV LAB;  Service: Cardiovascular;  Laterality: N/A;   APPENDECTOMY      BACK SURGERY     CARDIOVERSION N/A 03/14/2018   Procedure: CARDIOVERSION;  Surgeon: Okey Vina GAILS, MD;  Location: [facility] ENDOSCOPY;  Service: Cardiovascular;  Laterality: N/A;   PERIPHERAL VASCULAR BALLOON ANGIOPLASTY  03/27/2023   Procedure: PERIPHERAL VASCULAR BALLOON ANGIOPLASTY;  Surgeon: Lanis Fonda BRAVO, MD;  Location: [facility] INVASIVE CV LAB;  Service: Cardiovascular;;  Right AT   TEE WITHOUT CARDIOVERSION N/A 03/14/2018   Procedure: TRANSESOPHAGEAL ECHOCARDIOGRAM (TEE);  Surgeon: Okey Vina GAILS, MD;  Location: [facility] ENDOSCOPY;  Service: Cardiovascular;  Laterality: N/A;   TEE WITHOUT CARDIOVERSION N/A 02/26/2022   Procedure: TRANSESOPHAGEAL ECHOCARDIOGRAM (TEE);  Surgeon: Gardenia Led, DO;  Location: [facility] ENDOSCOPY;  Service: Cardiovascular;  Laterality: N/A;    No Known Allergies  Current Outpatient Medications  Medication Sig Dispense Refill   atorvastatin  (LIPITOR) 40 MG tablet Take 1 tablet (40 mg total) by mouth daily. 90 tablet 2   carvedilol  (COREG ) 6.25 MG tablet TAKE 1 TABLET(6.25 MG) BY MOUTH TWICE DAILY 180 tablet 0   clopidogrel  (PLAVIX ) 75 MG tablet Take 1 tablet (75 mg total) by mouth daily. 90 tablet 4   dofetilide  (TIKOSYN ) 125 MCG capsule Take 1 capsule (125 mcg total) by mouth 2 (two) times daily. (Patient not taking: Reported on 07/17/2023) 60 capsule 3   ELIQUIS  5 MG TABS tablet TAKE 1 TABLET BY MOUTH  TWICE  DAILY 200 tablet 2   feeding supplement (ENSURE IMMUNE HEALTH) LIQD Take 237 mLs by mouth 3 (three) times daily with meals.     furosemide  (LASIX ) 40 MG tablet Take 40 mg by mouth daily.     methimazole  (TAPAZOLE ) 5 MG tablet Take 1 tablet (5 mg total) by mouth daily. (Patient not taking: Reported on 07/17/2023) 90 tablet 3   oxyCODONE -acetaminophen  (PERCOCET/ROXICET) 5-325 MG tablet Take 1 tablet by mouth every 6 (six) hours as needed for severe pain (pain score 7-10). 30 tablet 0   potassium chloride  (KLOR-CON ) 10 MEQ tablet TAKE 1 TABLET(10 MEQ) BY MOUTH DAILY 90 tablet 3    spironolactone  (ALDACTONE ) 25 MG tablet Take 0.5 tablets (12.5 mg total) by mouth daily. 45 tablet 3   No current facility-administered medications for this visit.    Family History  Problem Relation Age of Onset   Cancer Mother    Heart disease Father     Social History   Socioeconomic History   Marital status: Single    Spouse name: Not on file   Number of children: 1   Years of education: Not on file   Highest education level: High school graduate  Occupational History   Occupation: unemployed    Comment: on disablity  Tobacco Use   Smoking status: Every Day    Current packs/day: 0.00    Average packs/day: 2.0 packs/day for 30.0 years (60.0 ttl pk-yrs)    Types: Cigarettes    Start date: 02/09/1988    Last attempt to quit: 02/08/2018    Years since quitting: 5.4   Smokeless tobacco: Never   Tobacco comments:    3 cigarettes daily 02/09/22  Vaping Use   Vaping status: Never Used  Substance and Sexual Activity   Alcohol use: No    Comment: quit 2 years ago   Drug use: No   Sexual activity: Not Currently  Other Topics Concern   Not on file  Social History Narrative   Not on file   Social Drivers of Health   Financial Resource Strain: Low Risk  (07/17/2022)   Overall Financial Resource Strain (CARDIA)    Difficulty of Paying Living Expenses: Not hard at all  Food Insecurity: No Food Insecurity (03/04/2023)   Hunger Vital Sign    Worried About Running Out of Food in the Last Year: Never true    Ran Out of Food in the Last Year: Never true  Transportation Needs: Unmet Transportation Needs (03/04/2023)   PRAPARE - Administrator, Civil Service (Medical): Yes    Lack of Transportation (Non-Medical): No  Physical Activity: Insufficiently Active (07/17/2022)   Exercise Vital Sign    Days of Exercise per Week: 3 days    Minutes of Exercise per Session: 30 min  Stress: No Stress Concern Present (07/17/2022)   [employer] of Occupational Health -  Occupational Stress Questionnaire    Feeling of Stress : Not at all  Social Connections: Moderately Isolated (03/04/2023)   Social Connection and Isolation Panel    Frequency of Communication with Friends and Family: More than three times a week    Frequency of Social Gatherings with Friends and Family: Three times a week    Attends Religious Services: More than 4 times per year    Active Member of Clubs or Organizations: No    Attends Banker Meetings: Never    Marital Status: Never married  Intimate Partner Violence: Not At Risk (03/04/2023)  Humiliation, Afraid, Rape, and Kick questionnaire    Fear of Current or Ex-Partner: No    Emotionally Abused: No    Physically Abused: No    Sexually Abused: No     REVIEW OF SYSTEMS:   [X]  denotes positive finding, [ ]  denotes negative finding Cardiac  Comments:  Chest pain or chest pressure:    Shortness of breath upon exertion:    Short of breath when lying flat:    Irregular heart rhythm:        Vascular    Pain in calf, thigh, or hip brought on by ambulation:    Pain in feet at night that wakes you up from your sleep:     Blood clot in your veins:    Leg swelling:         Pulmonary    Oxygen at home:    Productive cough:     Wheezing:          Neurologic    Sudden weakness in arms or legs:     Sudden numbness in arms or legs:     Sudden onset of difficulty speaking or slurred speech:    Temporary loss of vision in one eye:     Problems with dizziness:         Gastrointestinal    Blood in stool:     Vomited blood:         Genitourinary    Burning when urinating:     Blood in urine:        Psychiatric    Major depression:         Hematologic    Bleeding problems:    Problems with blood clotting too easily:        Skin    Rashes or ulcers: x       Constitutional    Fever or chills:      PHYSICAL EXAMINATION:  There were no vitals filed for this visit.  There is no height or weight on file to  calculate BMI.   General:  WDWN in NAD; vital signs documented above Gait: Not observed HENT: WNL, normocephalic Pulmonary: normal non-labored breathing , without wheezing Cardiac: irregular HR, without carotid bruits Skin: without rashes Vascular Exam/Pulses:  Right Left  Radial 2+ (normal) 2+ (normal)  DP monophasic multiphasic  PT monophasic multiphasic   Extremities: ischemic changes to toes on right  Toes of demarcate is much as possible.   Musculoskeletal: no muscle wasting or atrophy  Neurologic: A&O X 3 Psychiatric:  The pt has Normal affect.    ABI Findings:  +---------+------------------+-----+----------+----------+  Right   Rt Pressure (mmHg)IndexWaveform  Comment     +---------+------------------+-----+----------+----------+  Brachial 127                                          +---------+------------------+-----+----------+----------+  PTA     112               0.88 monophasic            +---------+------------------+-----+----------+----------+  PERO    119               0.94 monophasic            +---------+------------------+-----+----------+----------+  DP      120               0.94 monophasic            +---------+------------------+-----+----------+----------+  Great Toe                                 gangrenous  +---------+------------------+-----+----------+----------+   +---------+------------------+-----+--------+-------+  Left    Lt Pressure (mmHg)IndexWaveformComment  +---------+------------------+-----+--------+-------+  Brachial 123                                     +---------+------------------+-----+--------+-------+  PTA     159               1.25 biphasic         +---------+------------------+-----+--------+-------+  DP      131               1.03 biphasic         +---------+------------------+-----+--------+-------+  Great Toe77                0.61 Abnormal          +---------+------------------+-----+--------+-------+   +-------+-----------+-----------+------------+------------+  ABI/TBIToday's ABIToday's TBIPrevious ABIPrevious TBI  +-------+-----------+-----------+------------+------------+  Right .94                   1.23                      +-------+-----------+-----------+------------+------------+  Left  1.25       .61        .15         .52           +-------+-----------+-----------+------------+------------+    Summary:  Right: 50-74% stenosis noted in the superficial femoral artery. 75-99%  stenosis noted in the posterior tibial artery.     ASSESSMENT/PLAN:: 63 y.o. female here for follow up for PAD with hx of angiogram with balloon angioplasty of the right TPT, PTA on 03/27/2023 by Dr. Lanis for blue toe syndrome.  She underwent angiogram with right EIA angioplasty and stent on 03/07/2023 for CLI with rest pain by Dr. Gretta  Patient continues to have significant pain in the right foot.  While there is dry gangrene, there is wet tissue below. ABI demonstrates a significant decrease in the right lower extremity since last seen. Arterial duplex study demonstrates 2 areas of focal, high-grade stenosis which will inevitably limit perfusion to the foot.  I am worried that the stenosis appreciated in the superficial femoral artery and posterior tibial artery may inhibit wound healing.  I discussed this at length with Heron.  After discussing the risks and benefits of repeat right lower extremity angiogram in an effort to define and improve distal perfusion for wound healing she elected to proceed.  My plan would be to admit her after her angiogram with plans for definitive TMA as she has continued pain and is unable to sleep.  I will ensure Dr. Janit is aware of her visit and will try to coordinate scheduling with him.  Fonda FORBES Lanis [facility] [phone]

## 2023-08-08 ENCOUNTER — Encounter: Payer: Self-pay | Admitting: *Deleted

## 2023-08-08 ENCOUNTER — Ambulatory Visit: Attending: Vascular Surgery | Admitting: Vascular Surgery

## 2023-08-08 ENCOUNTER — Encounter: Payer: Self-pay | Admitting: Vascular Surgery

## 2023-08-08 ENCOUNTER — Ambulatory Visit (HOSPITAL_COMMUNITY)
Admission: RE | Admit: 2023-08-08 | Discharge: 2023-08-08 | Disposition: A | Discharge status: Home / Self Care | Source: Ambulatory Visit | Attending: Vascular Surgery | Admitting: Vascular Surgery

## 2023-08-08 ENCOUNTER — Other Ambulatory Visit: Payer: Self-pay | Admitting: *Deleted

## 2023-08-08 VITALS — BP 122/67 | HR 51 | Temp 97.9°F

## 2023-08-08 DIAGNOSIS — I96 Gangrene, not elsewhere classified: Secondary | ICD-10-CM

## 2023-08-08 DIAGNOSIS — I70238 Atherosclerosis of native arteries of right leg with ulceration of other part of lower right leg: Secondary | ICD-10-CM

## 2023-08-08 LAB — VAS US ABI WITH/WO TBI
Left ABI: 1.25
Right ABI: 0.94

## 2023-08-13 ENCOUNTER — Other Ambulatory Visit (HOSPITAL_COMMUNITY): Payer: Self-pay | Admitting: Cardiology

## 2023-08-15 ENCOUNTER — Encounter (HOSPITAL_COMMUNITY)
Admission: RE | Disposition: A | Discharge status: Home / Self Care | Payer: Self-pay | Source: Home / Self Care | Attending: Vascular Surgery

## 2023-08-15 ENCOUNTER — Other Ambulatory Visit: Payer: Self-pay

## 2023-08-15 ENCOUNTER — Ambulatory Visit (HOSPITAL_COMMUNITY)
Admission: RE | Admit: 2023-08-15 | Discharge: 2023-08-15 | Disposition: A | Discharge status: Home / Self Care | Attending: Vascular Surgery | Admitting: Vascular Surgery

## 2023-08-15 DIAGNOSIS — Z538 Procedure and treatment not carried out for other reasons: Secondary | ICD-10-CM | POA: Diagnosis not present

## 2023-08-15 DIAGNOSIS — I70229 Atherosclerosis of native arteries of extremities with rest pain, unspecified extremity: Secondary | ICD-10-CM | POA: Diagnosis present

## 2023-08-15 DIAGNOSIS — I70238 Atherosclerosis of native arteries of right leg with ulceration of other part of lower right leg: Secondary | ICD-10-CM

## 2023-08-15 LAB — POCT I-STAT, CHEM 8
BUN: 56 mg/dL — ABNORMAL HIGH (ref 8–23)
Calcium, Ion: 1.21 mmol/L (ref 1.15–1.40)
Chloride: 106 mmol/L (ref 98–111)
Creatinine, Ser: 1 mg/dL (ref 0.44–1.00)
Glucose, Bld: 92 mg/dL (ref 70–99)
HCT: 39 % (ref 36.0–46.0)
Hemoglobin: 13.3 g/dL (ref 12.0–15.0)
Potassium: 3.8 mmol/L (ref 3.5–5.1)
Sodium: 141 mmol/L (ref 135–145)
TCO2: 21 mmol/L — ABNORMAL LOW (ref 22–32)

## 2023-08-15 SURGERY — ABDOMINAL AORTOGRAM
Anesthesia: LOCAL

## 2023-08-15 NOTE — Progress Notes (Signed)
 Patient was scheduled for lower extremity angiogram with me today after recent evaluation with Dr. Lanis in the office for CLI with tissue loss.  Arrived today and took her Eliquis  up through last night.  I have discussed with Dr. Lanis we will cancel her case today.  Will let her go home.  Will reschedule first available next week for angiogram.  Discussed she needs to hold her Eliquis  for 2 days prior to the procedure.   At that time Dr. Lanis plans to have her admitted to the hospitalist and engage podiatry.  Lonni DOROTHA Gaskins, MD [facility] [place] Office: [phone]   Lonni JINNY Gaskins

## 2023-08-16 ENCOUNTER — Encounter (HOSPITAL_COMMUNITY): Admission: RE | Payer: Self-pay | Source: Home / Self Care

## 2023-08-16 ENCOUNTER — Ambulatory Visit (HOSPITAL_COMMUNITY): Admission: RE | Admit: 2023-08-16 | Source: Home / Self Care | Admitting: Podiatry

## 2023-08-16 SURGERY — AMPUTATION, FOOT, TRANSMETATARSAL
Anesthesia: Choice | Site: Toe | Laterality: Right

## 2023-08-19 ENCOUNTER — Inpatient Hospital Stay (HOSPITAL_COMMUNITY)
Admission: RE | Admit: 2023-08-19 | Discharge: 2023-08-23 | DRG: 240 | Disposition: A | Discharge status: Home Health | Attending: Family Medicine | Admitting: Family Medicine

## 2023-08-19 ENCOUNTER — Encounter (HOSPITAL_COMMUNITY)
Admission: RE | Disposition: A | Discharge status: Home Health | Payer: Self-pay | Source: Home / Self Care | Attending: Family Medicine

## 2023-08-19 ENCOUNTER — Other Ambulatory Visit: Payer: Self-pay

## 2023-08-19 DIAGNOSIS — I5042 Chronic combined systolic (congestive) and diastolic (congestive) heart failure: Secondary | ICD-10-CM | POA: Diagnosis not present

## 2023-08-19 DIAGNOSIS — F17210 Nicotine dependence, cigarettes, uncomplicated: Secondary | ICD-10-CM | POA: Diagnosis present

## 2023-08-19 DIAGNOSIS — I96 Gangrene, not elsewhere classified: Secondary | ICD-10-CM | POA: Diagnosis not present

## 2023-08-19 DIAGNOSIS — I70239 Atherosclerosis of native arteries of right leg with ulceration of unspecified site: Secondary | ICD-10-CM

## 2023-08-19 DIAGNOSIS — I70238 Atherosclerosis of native arteries of right leg with ulceration of other part of lower right leg: Secondary | ICD-10-CM

## 2023-08-19 DIAGNOSIS — L03115 Cellulitis of right lower limb: Secondary | ICD-10-CM | POA: Diagnosis present

## 2023-08-19 DIAGNOSIS — Z716 Tobacco abuse counseling: Secondary | ICD-10-CM | POA: Diagnosis not present

## 2023-08-19 DIAGNOSIS — I4891 Unspecified atrial fibrillation: Secondary | ICD-10-CM | POA: Diagnosis not present

## 2023-08-19 DIAGNOSIS — I70261 Atherosclerosis of native arteries of extremities with gangrene, right leg: Principal | ICD-10-CM

## 2023-08-19 DIAGNOSIS — Z79899 Other long term (current) drug therapy: Secondary | ICD-10-CM

## 2023-08-19 DIAGNOSIS — Z95820 Peripheral vascular angioplasty status with implants and grafts: Secondary | ICD-10-CM

## 2023-08-19 DIAGNOSIS — Z720 Tobacco use: Secondary | ICD-10-CM | POA: Diagnosis not present

## 2023-08-19 DIAGNOSIS — Z8249 Family history of ischemic heart disease and other diseases of the circulatory system: Secondary | ICD-10-CM

## 2023-08-19 DIAGNOSIS — Z7901 Long term (current) use of anticoagulants: Secondary | ICD-10-CM | POA: Diagnosis not present

## 2023-08-19 DIAGNOSIS — I5020 Unspecified systolic (congestive) heart failure: Secondary | ICD-10-CM | POA: Insufficient documentation

## 2023-08-19 DIAGNOSIS — I5022 Chronic systolic (congestive) heart failure: Secondary | ICD-10-CM | POA: Diagnosis present

## 2023-08-19 DIAGNOSIS — Z5982 Transportation insecurity: Secondary | ICD-10-CM

## 2023-08-19 DIAGNOSIS — L97819 Non-pressure chronic ulcer of other part of right lower leg with unspecified severity: Secondary | ICD-10-CM | POA: Diagnosis present

## 2023-08-19 DIAGNOSIS — J449 Chronic obstructive pulmonary disease, unspecified: Secondary | ICD-10-CM | POA: Diagnosis present

## 2023-08-19 DIAGNOSIS — Z7902 Long term (current) use of antithrombotics/antiplatelets: Secondary | ICD-10-CM

## 2023-08-19 DIAGNOSIS — I4819 Other persistent atrial fibrillation: Secondary | ICD-10-CM | POA: Diagnosis present

## 2023-08-19 DIAGNOSIS — I10 Essential (primary) hypertension: Secondary | ICD-10-CM | POA: Diagnosis not present

## 2023-08-19 DIAGNOSIS — L97919 Non-pressure chronic ulcer of unspecified part of right lower leg with unspecified severity: Secondary | ICD-10-CM

## 2023-08-19 DIAGNOSIS — Z560 Unemployment, unspecified: Secondary | ICD-10-CM | POA: Diagnosis not present

## 2023-08-19 DIAGNOSIS — I739 Peripheral vascular disease, unspecified: Principal | ICD-10-CM | POA: Diagnosis present

## 2023-08-19 DIAGNOSIS — I428 Other cardiomyopathies: Secondary | ICD-10-CM | POA: Diagnosis present

## 2023-08-19 DIAGNOSIS — I110 Hypertensive heart disease with heart failure: Secondary | ICD-10-CM | POA: Diagnosis present

## 2023-08-19 DIAGNOSIS — Z87891 Personal history of nicotine dependence: Secondary | ICD-10-CM

## 2023-08-19 HISTORY — PX: LOWER EXTREMITY INTERVENTION: CATH118252

## 2023-08-19 HISTORY — PX: ABDOMINAL AORTOGRAM: CATH118222

## 2023-08-19 HISTORY — PX: LOWER EXTREMITY ANGIOGRAPHY: CATH118251

## 2023-08-19 LAB — CBC
HCT: 43.2 % (ref 36.0–46.0)
Hemoglobin: 12.7 g/dL (ref 12.0–15.0)
MCH: 27.6 pg (ref 26.0–34.0)
MCHC: 29.4 g/dL — ABNORMAL LOW (ref 30.0–36.0)
MCV: 93.9 fL (ref 80.0–100.0)
Platelets: 439 K/uL — ABNORMAL HIGH (ref 150–400)
RBC: 4.6 MIL/uL (ref 3.87–5.11)
RDW: 13 % (ref 11.5–15.5)
WBC: 8.6 K/uL (ref 4.0–10.5)
nRBC: 0 % (ref 0.0–0.2)

## 2023-08-19 LAB — POCT I-STAT, CHEM 8
BUN: 38 mg/dL — ABNORMAL HIGH (ref 8–23)
Calcium, Ion: 1.22 mmol/L (ref 1.15–1.40)
Chloride: 110 mmol/L (ref 98–111)
Creatinine, Ser: 1.1 mg/dL — ABNORMAL HIGH (ref 0.44–1.00)
Glucose, Bld: 96 mg/dL (ref 70–99)
HCT: 45 % (ref 36.0–46.0)
Hemoglobin: 15.3 g/dL — ABNORMAL HIGH (ref 12.0–15.0)
Potassium: 3.9 mmol/L (ref 3.5–5.1)
Sodium: 143 mmol/L (ref 135–145)
TCO2: 20 mmol/L — ABNORMAL LOW (ref 22–32)

## 2023-08-19 LAB — CREATININE, SERUM
Creatinine, Ser: 0.85 mg/dL (ref 0.44–1.00)
GFR, Estimated: >60 mL/min (ref >60)

## 2023-08-19 LAB — POCT ACTIVATED CLOTTING TIME: Activated Clotting Time: 170 s

## 2023-08-19 MED ORDER — ATROPINE SULFATE 1 MG/10ML IJ SOSY
PREFILLED_SYRINGE | INTRAMUSCULAR | Status: AC
  Filled 2023-08-19: qty 10

## 2023-08-19 MED ORDER — ATORVASTATIN CALCIUM 40 MG PO TABS
40.0000 mg | ORAL_TABLET | Freq: Every day | ORAL | Status: DC
Start: 2023-08-19 — End: 2023-08-23
  Administered 2023-08-19 – 2023-08-23 (×5): 40 mg via ORAL
  Filled 2023-08-19 (×5): qty 1

## 2023-08-19 MED ORDER — SPIRONOLACTONE 12.5 MG HALF TABLET
12.5000 mg | ORAL_TABLET | Freq: Every day | ORAL | Status: DC
  Administered 2023-08-19 – 2023-08-23 (×5): 12.5 mg via ORAL
  Filled 2023-08-19 (×5): qty 1

## 2023-08-19 MED ORDER — LIDOCAINE HCL (PF) 1 % IJ SOLN
INTRAMUSCULAR | Status: DC | PRN
Start: 2023-08-19 — End: 2023-08-19
  Administered 2023-08-19: 10 mL

## 2023-08-19 MED ORDER — DOFETILIDE 125 MCG PO CAPS
125.0000 ug | ORAL_CAPSULE | Freq: Two times a day (BID) | ORAL | Status: DC
  Administered 2023-08-19 – 2023-08-23 (×8): 125 ug via ORAL
  Filled 2023-08-19 (×9): qty 1

## 2023-08-19 MED ORDER — SODIUM CHLORIDE 0.9 % IV SOLN: INTRAVENOUS | Status: DC

## 2023-08-19 MED ORDER — IODIXANOL 320 MG/ML IV SOLN
INTRAVENOUS | Status: DC | PRN
  Administered 2023-08-19: 85 mL

## 2023-08-19 MED ORDER — MIDAZOLAM HCL 2 MG/2ML IJ SOLN
INTRAMUSCULAR | Status: AC
  Filled 2023-08-19: qty 2

## 2023-08-19 MED ORDER — HYDRALAZINE HCL 20 MG/ML IJ SOLN: 5.0000 mg | INTRAMUSCULAR | Status: DC | PRN

## 2023-08-19 MED ORDER — ENSURE IMMUNE HEALTH PO LIQD: 237.0000 mL | Freq: Two times a day (BID) | ORAL | Status: DC

## 2023-08-19 MED ORDER — CLOPIDOGREL BISULFATE 75 MG PO TABS
75.0000 mg | ORAL_TABLET | Freq: Every day | ORAL | Status: DC
  Administered 2023-08-20 – 2023-08-23 (×4): 75 mg via ORAL
  Filled 2023-08-19 (×4): qty 1

## 2023-08-19 MED ORDER — LIDOCAINE HCL (PF) 1 % IJ SOLN
INTRAMUSCULAR | Status: AC
  Filled 2023-08-19: qty 30

## 2023-08-19 MED ORDER — MORPHINE SULFATE (PF) 2 MG/ML IV SOLN
2.0000 mg | INTRAVENOUS | Status: DC | PRN
  Administered 2023-08-22 (×3): 2 mg via INTRAVENOUS
  Filled 2023-08-19 (×4): qty 1

## 2023-08-19 MED ORDER — FENTANYL CITRATE (PF) 100 MCG/2ML IJ SOLN
INTRAMUSCULAR | Status: DC | PRN
  Administered 2023-08-19: 25 ug via INTRAVENOUS

## 2023-08-19 MED ORDER — FENTANYL CITRATE (PF) 100 MCG/2ML IJ SOLN
INTRAMUSCULAR | Status: AC
Start: 2023-08-19 — End: 2023-08-19
  Filled 2023-08-19: qty 2

## 2023-08-19 MED ORDER — METHIMAZOLE 5 MG PO TABS
5.0000 mg | ORAL_TABLET | Freq: Every day | ORAL | Status: DC
  Administered 2023-08-19 – 2023-08-23 (×5): 5 mg via ORAL
  Filled 2023-08-19 (×5): qty 1

## 2023-08-19 MED ORDER — CARVEDILOL 3.125 MG PO TABS
3.1320 mg | ORAL_TABLET | Freq: Two times a day (BID) | ORAL | Status: DC
  Administered 2023-08-20 (×2): 3.132 mg via ORAL
  Filled 2023-08-19 (×4): qty 2

## 2023-08-19 MED ORDER — FUROSEMIDE 40 MG PO TABS
40.0000 mg | ORAL_TABLET | Freq: Every day | ORAL | Status: DC
  Administered 2023-08-19 – 2023-08-23 (×5): 40 mg via ORAL
  Filled 2023-08-19 (×5): qty 1

## 2023-08-19 MED ORDER — LABETALOL HCL 5 MG/ML IV SOLN: 10.0000 mg | INTRAVENOUS | Status: DC | PRN

## 2023-08-19 MED ORDER — HEPARIN (PORCINE) IN NACL 1000-0.9 UT/500ML-% IV SOLN
INTRAVENOUS | Status: DC | PRN
  Administered 2023-08-19: 1000 mL via SURGICAL_CAVITY

## 2023-08-19 MED ORDER — ACETAMINOPHEN 325 MG PO TABS: 650.0000 mg | ORAL_TABLET | ORAL | Status: DC | PRN

## 2023-08-19 MED ORDER — CLOPIDOGREL BISULFATE 300 MG PO TABS
ORAL_TABLET | ORAL | Status: DC | PRN
  Administered 2023-08-19: 75 mg via ORAL

## 2023-08-19 MED ORDER — OXYCODONE-ACETAMINOPHEN 5-325 MG PO TABS
1.0000 | ORAL_TABLET | Freq: Four times a day (QID) | ORAL | Status: DC | PRN
  Administered 2023-08-19 – 2023-08-22 (×5): 1 via ORAL
  Filled 2023-08-19 (×5): qty 1

## 2023-08-19 MED ORDER — ONDANSETRON HCL 4 MG/2ML IJ SOLN: 4.0000 mg | Freq: Four times a day (QID) | INTRAMUSCULAR | Status: DC | PRN

## 2023-08-19 MED ORDER — HEPARIN SODIUM (PORCINE) 1000 UNIT/ML IJ SOLN
INTRAMUSCULAR | Status: AC
  Filled 2023-08-19: qty 10

## 2023-08-19 MED ORDER — SODIUM CHLORIDE 0.9% FLUSH
3.0000 mL | Freq: Two times a day (BID) | INTRAVENOUS | Status: DC
  Administered 2023-08-20 – 2023-08-23 (×7): 3 mL via INTRAVENOUS

## 2023-08-19 MED ORDER — SODIUM CHLORIDE 0.9 % WEIGHT BASED INFUSION
1.0000 mL/kg/h | INTRAVENOUS | Status: AC
  Administered 2023-08-19: 1 mL/kg/h via INTRAVENOUS

## 2023-08-19 MED ORDER — CLOPIDOGREL BISULFATE 75 MG PO TABS
ORAL_TABLET | ORAL | Status: AC
  Filled 2023-08-19: qty 1

## 2023-08-19 MED ORDER — SODIUM CHLORIDE 0.9 % IV SOLN: 250.0000 mL | INTRAVENOUS | Status: AC | PRN

## 2023-08-19 MED ORDER — HEPARIN SODIUM (PORCINE) 1000 UNIT/ML IJ SOLN
INTRAMUSCULAR | Status: DC | PRN
Start: 2023-08-19 — End: 2023-08-19
  Administered 2023-08-19: 5000 [IU] via INTRAVENOUS

## 2023-08-19 MED ORDER — OXYCODONE HCL 5 MG PO TABS
5.0000 mg | ORAL_TABLET | ORAL | Status: DC | PRN
  Filled 2023-08-19: qty 2

## 2023-08-19 MED ORDER — SODIUM CHLORIDE 0.9% FLUSH: 3.0000 mL | INTRAVENOUS | Status: DC | PRN

## 2023-08-19 MED ORDER — HEPARIN SODIUM (PORCINE) 5000 UNIT/ML IJ SOLN
5000.0000 [IU] | Freq: Three times a day (TID) | INTRAMUSCULAR | Status: DC
  Administered 2023-08-19 – 2023-08-21 (×5): 5000 [IU] via SUBCUTANEOUS
  Filled 2023-08-19 (×5): qty 1

## 2023-08-19 MED ORDER — MIDAZOLAM HCL 2 MG/2ML IJ SOLN
INTRAMUSCULAR | Status: DC | PRN
  Administered 2023-08-19: 1 mg via INTRAVENOUS

## 2023-08-19 SURGICAL SUPPLY — 18 items
BALLOON CYTE ES OTW 2.5X20X143 (BALLOONS) IMPLANT
BALLOON JADE .014 2.5 X 20 (BALLOONS) IMPLANT
BALLOON MUSTANG 5.0X40 135 (BALLOONS) IMPLANT
CATH OMNI FLUSH 5F 65CM (CATHETERS) IMPLANT
CATH RUBICON 018 135 (CATHETERS) IMPLANT
COVER DOME SNAP 22 D (MISCELLANEOUS) IMPLANT
KIT ENCORE 26 ADVANTAGE (KITS) IMPLANT
KIT MICROPUNCTURE NIT STIFF (SHEATH) IMPLANT
SET ATX-X65L (MISCELLANEOUS) IMPLANT
SHEATH CATAPULT 6FR 45 (SHEATH) IMPLANT
SHEATH PINNACLE 5F 10CM (SHEATH) IMPLANT
SHEATH PINNACLE 6F 10CM (SHEATH) IMPLANT
STENT ELUVIA 6X40X130 (Permanent Stent) IMPLANT
STENT ESPRIT BTK 2.5X28 SCAFF (Permanent Stent) IMPLANT
TRAY PV CATH (CUSTOM PROCEDURE TRAY) ×1 IMPLANT
WIRE BENTSON .035X145CM (WIRE) IMPLANT
WIRE G V18X300CM (WIRE) IMPLANT
WIRE HI TORQ COMMND ES.014X300 (WIRE) IMPLANT

## 2023-08-19 NOTE — Op Note (Signed)
 Patient name: Jacklynn Dehaas MRN: [id number] DOB: 1960/02/14 Sex: female  08/19/2023 Pre-operative Diagnosis: Atherosclerosis native arteries right lower extremity with gangrene of multiple toes Post-operative diagnosis:  Same Surgeon:  Penne BROCKS. Sheree, MD Procedure Performed: 1.  Percutaneous ultrasound-guided cannulation left common femoral artery 2.  Catheter in aorta and aortogram with bilateral lower extremity angiography 3.  Catheter selection of right common femoral artery, right popliteal artery and right posterior tibial arteries 4.  Stent of right posterior tibial artery and TP trunk with 2.5 x 28mm Esprit btk 5.  Stent of right SFA with 6 x 40 mm Eluvia 6.  Moderate sedation with fentanyl  and Versed  for 63 minutes  Indications: 63 year old female with significant gangrene of multiple toes on the right with recent drop in her ABI after previous stenting of her right external iliac artery as well as balloon angioplasty of the tibioperoneal trunk and posterior tibial arteries.  She now has evidence of recurrent disease in the SFA as well as the tibioperoneal trunk and proximal posterior tibial artery by duplex she is indicated for angiography with possible invention.  Findings: The aorta was somewhat difficult to visualize the branches but the celiac and SMA were patent.  Distally there was significant calcification of the bilateral common and external iliac arteries however both hypogastric arteries were patent and there was no flow-limiting stenosis.  There was a right external iliac artery stent which is patent.  Both common femoral arteries and their branches including the SFA and profunda are patent.  The right lower extremity which is the site of interest the SFA has approximately 80% stenosis at the adductor and this was stented to 0% residual stenosis.  Below the knee the anterior tibial artery is very large and runs off to the distal ankle but does not give rise to any large  dorsalis pedis.  The peroneal artery initially is patent and then very diminutive towards the ankle and is not visualized.  The posterior tibial artery initially is a 90% stenosis but is the dominant flow into the foot.  This was stented to 0% residual stenosis.  Patient is optimized from a vascular surgery standpoint for amputation of her toes versus partial foot.   Procedure:  The patient was identified in the holding area and taken to room 8.  The patient was then placed supine on the table and prepped and draped in the usual sterile fashion.  A time out was called.  Ultrasound was used to evaluate the left common femoral artery which was noted be patent but there was significant posterior plaque.  The area was anesthetized 1% lidocaine  and cannulated with a micropuncture needle followed by wire and sheath using direct ultrasound visualization and ultrasound images saved to the permanent record.  Concomitantly we administered fentanyl  and Versed  as moderate sedation her vital signs were monitored throughout the case.  I placed the Bentson wire under fluoroscopic guidance followed by 5 French sheath and Omni catheter was placed to the level of L1 and aortogram was performed followed by pelvic angiography.  With the above findings we crossed the bifurcation with Bentson wire and Omni catheter.  We then performed right lower extremity angiography from the right common femoral artery and with the above findings were then placed a long 6 French sheath the patient was fully heparinized.  Using a V18 wire we crossed the SFA on the right and then below the knee we used a Rubicon catheter and combination of 014 and 018 wires and  were ultimately able to cross the posterior tibial artery and confirmed intraluminal access with a catheter.  We then exchanged for an 014 wire and then balloon dilated the proximal posterior tibial artery and proximal tibioperoneal trunk with a 2.5 mm coyote and then primarily stented with  2.5 mm Esprit postdilated with a 2.5 mm Jade and completion demonstrated distal spasm but ultimately after removing the wire there was patency.  We turned our attention towards the SFA angiography demonstrated the stenosis in the SFA we primarily stented this and postdilated with 5 mm balloon to 0% residual stenosis on completion.  Satisfied with this we exchanged for a short 6 French sheath in the groin.  The patient tolerated the procedure without any complication.   Contrast: 85cc   Myrle Dues C. Sheree, MD [facility] [place] Office: [phone] Pager: [phone]

## 2023-08-19 NOTE — Plan of Care (Signed)
   Problem: Clinical Measurements: Goal: Respiratory complications will improve Outcome: Progressing Goal: Cardiovascular complication will be avoided Outcome: Progressing

## 2023-08-19 NOTE — Progress Notes (Signed)
 Order for sheath removal verified per post procedural orders. Procedure explained to patient andLt femoral artery access site assessed: level 0, doppler dorsalis pedis and posterior tibial pulses. 6 French Sheath removed and manual pressure applied for 20 minutes. Pre, peri, & post procedural vitals: HR 60-65, RR 15-18, O2 Sat upper 95-99, BP post 138/77, Pain 0. Distal pulses remained intact after sheath removal. Access site level 0 and dressed with 4X4 gauze and tegaderm. Post procedural instructions discussed and return demonstration from patient.

## 2023-08-19 NOTE — Progress Notes (Signed)
 Patient arrived at the unit from Cath Lab,upon arrival pt is alert and oriented X4,CHG bath given,vitals taken,rt groin incision soft,pt oriented to the uni,call bell in reach

## 2023-08-19 NOTE — Interval H&P Note (Signed)
 History and Physical Interval Note:  08/19/2023 11:12 AM  Joanna [name]  has presented today for surgery, with the diagnosis of ichemia right leg with ulcer.  The various methods of treatment have been discussed with the patient and family. After consideration of risks, benefits and other options for treatment, the patient has consented to  Procedure(s): ABDOMINAL AORTOGRAM (N/A) Lower Extremity Angiography (N/A) LOWER EXTREMITY INTERVENTION (N/A) as a surgical intervention.  The patient's history has been reviewed, patient examined, no change in status, stable for surgery.  I have reviewed the patient's chart and labs.  Questions were answered to the patient's satisfaction.     Penne [place]

## 2023-08-20 ENCOUNTER — Encounter (HOSPITAL_COMMUNITY): Payer: Self-pay | Admitting: Vascular Surgery

## 2023-08-20 DIAGNOSIS — I10 Essential (primary) hypertension: Secondary | ICD-10-CM

## 2023-08-20 DIAGNOSIS — Z720 Tobacco use: Secondary | ICD-10-CM

## 2023-08-20 DIAGNOSIS — I4891 Unspecified atrial fibrillation: Secondary | ICD-10-CM | POA: Diagnosis not present

## 2023-08-20 DIAGNOSIS — I96 Gangrene, not elsewhere classified: Secondary | ICD-10-CM | POA: Diagnosis present

## 2023-08-20 DIAGNOSIS — I5020 Unspecified systolic (congestive) heart failure: Secondary | ICD-10-CM | POA: Insufficient documentation

## 2023-08-20 DIAGNOSIS — I5022 Chronic systolic (congestive) heart failure: Secondary | ICD-10-CM

## 2023-08-20 LAB — COMPREHENSIVE METABOLIC PANEL WITH GFR
ALT: 8 U/L (ref 0–44)
AST: 13 U/L — ABNORMAL LOW (ref 15–41)
Albumin: 3.2 g/dL — ABNORMAL LOW (ref 3.5–5.0)
Alkaline Phosphatase: 66 U/L (ref 38–126)
Anion gap: 13 (ref 5–15)
BUN: 25 mg/dL — ABNORMAL HIGH (ref 8–23)
CO2: 22 mmol/L (ref 22–32)
Calcium: 9 mg/dL (ref 8.9–10.3)
Chloride: 105 mmol/L (ref 98–111)
Creatinine, Ser: 0.97 mg/dL (ref 0.44–1.00)
GFR, Estimated: >60 mL/min (ref >60)
Glucose, Bld: 119 mg/dL — ABNORMAL HIGH (ref 70–99)
Potassium: 4.1 mmol/L (ref 3.5–5.1)
Sodium: 140 mmol/L (ref 135–145)
Total Bilirubin: 0.5 mg/dL (ref 0.0–1.2)
Total Protein: 6.9 g/dL (ref 6.5–8.1)

## 2023-08-20 LAB — LIPID PANEL
Cholesterol: 71 mg/dL (ref 0–200)
HDL: 29 mg/dL — ABNORMAL LOW (ref >40)
LDL Cholesterol: 27 mg/dL (ref 0–99)
Total CHOL/HDL Ratio: 2.4 ratio
Triglycerides: 74 mg/dL (ref <150)
VLDL: 15 mg/dL (ref 0–40)

## 2023-08-20 LAB — HIV ANTIBODY (ROUTINE TESTING W REFLEX): HIV Screen 4th Generation wRfx: NONREACTIVE

## 2023-08-20 MED ORDER — ONDANSETRON HCL 4 MG/2ML IJ SOLN: 4.0000 mg | Freq: Four times a day (QID) | INTRAMUSCULAR | Status: DC | PRN

## 2023-08-20 MED ORDER — NICOTINE 7 MG/24HR TD PT24
7.0000 mg | MEDICATED_PATCH | Freq: Every day | TRANSDERMAL | Status: DC
  Administered 2023-08-20 – 2023-08-23 (×4): 7 mg via TRANSDERMAL
  Filled 2023-08-20 (×4): qty 1

## 2023-08-20 MED ORDER — ONDANSETRON HCL 4 MG PO TABS: 4.0000 mg | ORAL_TABLET | Freq: Four times a day (QID) | ORAL | Status: DC | PRN

## 2023-08-20 NOTE — Discharge Instructions (Signed)
 [facility]  Discharge Instructions  Lower Extremity Angiogram; Angioplasty/Stenting  Please refer to the following instructions for your post-procedure care. Your surgeon or physician assistant will discuss any changes with you.  Activity  Avoid lifting more than 8 pounds (1 gallons of milk) for 5 days after your procedure. You may walk as much as you can tolerate. It's OK to drive after 72 hours.  Bathing/Showering  You may shower the day after your procedure. If you have a bandage, you may remove it at 24- 48 hours. Clean your incision site with mild soap and water. Pat the area dry with a clean towel.  Diet  Resume your pre-procedure diet. There are no special food restrictions following this procedure. All patients with peripheral vascular disease should follow a low fat/low cholesterol diet. In order to heal from your surgery, it is CRITICAL to get adequate nutrition. Your body requires vitamins, minerals, and protein. Vegetables are the best source of vitamins and minerals. Vegetables also provide the perfect balance of protein. Processed food has little nutritional value, so try to avoid this.  Medications  Resume taking all of your medications unless your doctor tells you not to. If your incision is causing pain, you may take over-the-counter pain relievers such as acetaminophen  (Tylenol )  Follow Up  Follow up will be arranged at the time of your procedure. You may have an office visit scheduled or may be scheduled for surgery. Ask your surgeon if you have any questions.  Please call us  immediately for any of the following conditions: Severe or worsening pain your legs or feet at rest or with walking. Increased pain, redness, drainage at your groin puncture site. Fever of 101 degrees or higher. If you have any mild or slow bleeding from your puncture site: lie down, apply firm constant pressure over the area with a piece of gauze or a clean  wash cloth for 30 minutes- no peeking!, call 911 right away if you are still bleeding after 30 minutes, or if the bleeding is heavy and unmanageable.  Reduce your risk factors of vascular disease:  Stop smoking. If you would like help call [facility] at 1-800-QUIT-NOW (760-260-3188) or [place] at 432-747-2840. Manage your cholesterol Maintain a desired weight Control your diabetes Keep your blood pressure down  If you have any questions, please call the office at [phone]   Information on my medicine - ELIQUIS  (apixaban )  Why was Eliquis  prescribed for you? Eliquis  was prescribed for you to reduce the risk of a blood clot forming that can cause a stroke if you have a medical condition called atrial fibrillation (a type of irregular heartbeat).  What do You need to know about Eliquis  ? Take your Eliquis  TWICE DAILY - one tablet in the morning and one tablet in the evening with or without food. If you have difficulty swallowing the tablet whole please discuss with your pharmacist how to take the medication safely.  Take Eliquis  exactly as prescribed by your doctor and DO NOT stop taking Eliquis  without talking to the doctor who prescribed the medication.  Stopping may increase your risk of developing a stroke.  Refill your prescription before you run out.  After discharge, you should have regular check-up appointments with your healthcare provider that is prescribing your Eliquis .  In the future your dose may need to be changed if your kidney function or weight changes by a significant amount or as you get older.  What do you do if you  miss a dose? If you miss a dose, take it as soon as you remember on the same day and resume taking twice daily.  Do not take more than one dose of ELIQUIS  at the same time to make up a missed dose.  Important Safety Information A possible side effect of Eliquis  is bleeding. You should call your healthcare provider right away if you  experience any of the following: Bleeding from an injury or your nose that does not stop. Unusual colored urine (red or dark brown) or unusual colored stools (red or black). Unusual bruising for unknown reasons. A serious fall or if you hit your head (even if there is no bleeding).  Some medicines may interact with Eliquis  and might increase your risk of bleeding or clotting while on Eliquis . To help avoid this, consult your healthcare provider or pharmacist prior to using any new prescription or non-prescription medications, including herbals, vitamins, non-steroidal anti-inflammatory drugs (NSAIDs) and supplements.  This website has more information on Eliquis  (apixaban ): http://[email or web address] .com/eliquis /home   -non weight bearing to R FOOT--DO NOT PUT ANY WEIGHT ON YOUR FRONT FOOT  -wear post op shoe whenever walking   -KEEP dressing on until podiatry doctor follow up appointment

## 2023-08-20 NOTE — Progress Notes (Signed)
  Progress Note    08/20/2023 7:22 AM 1 Day Post-Op  Subjective:  says she had discomfort when straightening her leg, feels better keeping right knee bent. Asking about what's going to happen with her toes   Vitals:   08/20/23 0225 08/20/23 0607  BP: 95/79 106/62  Pulse: (!) 51 (!) 53  Resp: 15 12  Temp: 98 F (36.7 C)   SpO2: 100% 100%   Physical Exam: Cardiac:  regular Lungs:  non labored Extremities:  left CF access site dressings clean and dry. No swelling or hematoma. Right leg with doppler AT/ PT/ peroneal signals. Dry gangrene of right 1st-3rd toes Abdomen:  soft Neurologic: alert and oriented  CBC    Component Value Date/Time   WBC 8.6 08/19/2023 1806   RBC 4.60 08/19/2023 1806   HGB 12.7 08/19/2023 1806   HGB 14.4 03/03/2021 0954   HCT 43.2 08/19/2023 1806   HCT 44.3 03/03/2021 0954   PLT 439 (H) 08/19/2023 1806   PLT 378 03/03/2021 0954   MCV 93.9 08/19/2023 1806   MCV 87 03/03/2021 0954   MCH 27.6 08/19/2023 1806   MCHC 29.4 (L) 08/19/2023 1806   RDW 13.0 08/19/2023 1806   RDW 11.3 (L) 03/03/2021 0954   LYMPHSABS 1.4 03/03/2023 1703   MONOABS 0.5 03/03/2023 1703   EOSABS 0.1 03/03/2023 1703   BASOSABS 0.0 03/03/2023 1703    BMET    Component Value Date/Time   NA 143 08/19/2023 1107   NA 144 04/17/2022 0841   K 3.9 08/19/2023 1107   CL 110 08/19/2023 1107   CO2 21 (L) 03/09/2023 0307   GLUCOSE 96 08/19/2023 1107   BUN 38 (H) 08/19/2023 1107   BUN 27 04/17/2022 0841   CREATININE 0.85 08/19/2023 1806   CALCIUM  9.0 03/09/2023 0307   GFRNONAA >60 08/19/2023 1806   GFRAA 81 11/16/2019 1156    INR    Component Value Date/Time   INR 1.5 (H) 03/03/2023 1830     Intake/Output Summary (Last 24 hours) at 08/20/2023 9277 Last data filed at 08/20/2023 0226 Gross per 24 hour  Intake 814.31 ml  Output 900 ml  Net -85.69 ml     Assessment/Plan:  63 y.o. female is s/p Aortogram, arteriogram of Right PT and TPT and stent of right SFA 1 Day  Post-Op   RLE maximally revascularized with ATA, PT, and diminutive Pero Left CF access sites without swelling or hematoma Right foot gangrene. Plan is for TMA per Podiatry Continue Plavix  and Statin Will arrange follow up in our office in 1 month with ABI and RLE arterial duplex  Teretha Damme, PA-C [facility] [phone] 08/20/2023 7:22 AM  VASCULAR STAFF ADDENDUM: I have independently interviewed and examined the patient. I agree with the above.  Note entered delayed.  Patient doing well status post revascularization.  Palpable pulse in the foot, no hematoma.  I have reached out to Dr. Malvin regarding TMA.  Appreciate both his help and hospital medicine assuming care.  Will schedule follow-up.  Fonda FORBES Rim MD [facility] Phone Number: [phone] 08/21/2023 9:23 AM

## 2023-08-20 NOTE — H&P (Signed)
 History and Physical    Patient: Joanna [name] FMW:[id number] DOB: Oct 10, 1960 DOA: 08/19/2023 DOS: the patient was seen and examined on 08/20/2023 PCP: Vicci Barnie NOVAK, MD  Patient coming from: Home  Chief Complaint: No chief complaint on file.  HPI: Joanna [name] is a 63 y.o. female with medical history significant of atrial fibrillation, chronic HFrEF, hypertension, tobacco abuse, peripheral artery disease presenting with limb ischemia and right foot gangrene.  Patient noted to have recurring issues associate with PAD.  Was recently evaluated by vascular surgery in February of this year secondary to blue toe syndrome.  She underwent angiogram with angioplasty and stent placement January of this year prior to evaluation.  Was noted to have been seen by vascular surgery with loss of decreased flow on ABI.  Was also seen by outpatient podiatry with recommendation of transmetatarsal amputation to deal with issues per report.  Patient still smoking roughly 1/4 pack/day.  Denies any alcohol or illicit drug use.  Was admitted for July 14 for vascular evaluation including stenting of right SFA and posterior tibial artery.  Given persistence of symptoms, recommendations for transmetatarsal habitation.  Vascular surgery reached out to hospice service to take over care with Dr. Lore with vascular surgery reaching out to Dr. Malvin with podiatry to further evaluate.  At present, patient denies any chest pain, shortness of breath.  No nausea or vomiting.  Denies any abdominal pain or diarrhea.  No focal hemiparesis or confusion.  Patient denies any trauma to one side worsening right foot gangrene.  Patient unsure of exact duration of symptoms.  Patient noted to be overall poor historian. Currently on the floor afebrile, hemodynamically stable.  Satting well on room air.  White count 8.6, hemoglobin 12.7, platelets 439, creatinine 1.1. Review of Systems: As mentioned in the history of present illness.  All other systems reviewed and are negative. Past Medical History:  Diagnosis Date   Atrial fibrillation with RVR (HCC)    Chronic combined systolic (congestive) and diastolic (congestive) heart failure (HCC)    Dyspnea    Goiter    Hypertension    NICM (nonischemic cardiomyopathy) (HCC)    a. 08/2005 Echo: EF 30-35%, mod diff HK. Mild to Mod MR. Mildly dil LA; b. 08/2005 Cath: Nl Cors. Elevated CO w/o shunt; c. 09/2007 Echo: EF 45%. Mild to mod MR; c. 03/2015 Echo: EF 45%, global HK. Gr1 DD. Mild MR. Mildly dil LA.   Past Surgical History:  Procedure Laterality Date   ABDOMINAL AORTOGRAM N/A 08/19/2023   Procedure: ABDOMINAL AORTOGRAM;  Surgeon: Sheree Penne Bruckner, MD;  Location: [facility] INVASIVE CV LAB;  Service: Cardiovascular;  Laterality: N/A;   ABDOMINAL AORTOGRAM W/LOWER EXTREMITY N/A 03/07/2023   Procedure: ABDOMINAL AORTOGRAM W/LOWER EXTREMITY;  Surgeon: Gretta Bruckner PARAS, MD;  Location: [facility] INVASIVE CV LAB;  Service: Cardiovascular;  Laterality: N/A;   ABDOMINAL AORTOGRAM W/LOWER EXTREMITY N/A 03/27/2023   Procedure: ABDOMINAL AORTOGRAM W/LOWER EXTREMITY;  Surgeon: Lanis Fonda BRAVO, MD;  Location: [facility] INVASIVE CV LAB;  Service: Cardiovascular;  Laterality: N/A;   APPENDECTOMY     BACK SURGERY     CARDIOVERSION N/A 03/14/2018   Procedure: CARDIOVERSION;  Surgeon: Okey Vina GAILS, MD;  Location: [facility] ENDOSCOPY;  Service: Cardiovascular;  Laterality: N/A;   LOWER EXTREMITY ANGIOGRAPHY Right 08/19/2023   Procedure: Lower Extremity Angiography;  Surgeon: Sheree Penne Bruckner, MD;  Location: [facility] INVASIVE CV LAB;  Service: Cardiovascular;  Laterality: Right;   LOWER EXTREMITY INTERVENTION Right 08/19/2023   Procedure: LOWER EXTREMITY INTERVENTION;  Surgeon: Sheree Penne Bruckner, MD;  Location: [facility] INVASIVE CV LAB;  Service: Cardiovascular;  Laterality: Right;   PERIPHERAL VASCULAR BALLOON ANGIOPLASTY  03/27/2023   Procedure: PERIPHERAL VASCULAR BALLOON ANGIOPLASTY;  Surgeon: Lanis Fonda BRAVO, MD;  Location: [facility] INVASIVE CV LAB;  Service: Cardiovascular;;  Right AT   TEE WITHOUT CARDIOVERSION N/A 03/14/2018   Procedure: TRANSESOPHAGEAL ECHOCARDIOGRAM (TEE);  Surgeon: Okey Vina GAILS, MD;  Location: [facility] ENDOSCOPY;  Service: Cardiovascular;  Laterality: N/A;   TEE WITHOUT CARDIOVERSION N/A 02/26/2022   Procedure: TRANSESOPHAGEAL ECHOCARDIOGRAM (TEE);  Surgeon: Gardenia Led, DO;  Location: [facility] ENDOSCOPY;  Service: Cardiovascular;  Laterality: N/A;   Social History:  reports that she has been smoking cigarettes. She started smoking about 35 years ago. She has a 60 pack-year smoking history. She has never used smokeless tobacco. She reports that she does not drink alcohol and does not use drugs.  No Known Allergies  Family History  Problem Relation Age of Onset   Cancer Mother    Heart disease Father     Prior to Admission medications   Medication Sig Start Date End Date Taking? Authorizing Provider  atorvastatin  (LIPITOR) 40 MG tablet Take 1 tablet (40 mg total) by mouth daily. 06/11/23  Yes Vicci Barnie NOVAK, MD  carvedilol  (COREG ) 6.25 MG tablet TAKE 1 TABLET(6.25 MG) BY MOUTH TWICE DAILY Patient taking differently: Take 3.132 mg by mouth 2 (two) times daily with a meal. 07/26/23  Yes Sabharwal, Aditya, DO  clopidogrel  (PLAVIX ) 75 MG tablet Take 1 tablet (75 mg total) by mouth daily. 04/08/23 07/01/24 Yes Magda Debby SAILOR, MD  dofetilide  (TIKOSYN ) 125 MCG capsule Take 1 capsule (125 mcg total) by mouth 2 (two) times daily. 07/15/23  Yes Terra Fairy PARAS, PA-C  ELIQUIS  5 MG TABS tablet TAKE 1 TABLET BY MOUTH TWICE  DAILY 03/26/23  Yes Nahser, Aleene PARAS, MD  feeding supplement (ENSURE IMMUNE HEALTH) LIQD Take 237 mLs by mouth 2 (two) times daily. If she has any   Yes [provider]  furosemide  (LASIX ) 40 MG tablet Take 40 mg by mouth daily.   Yes [provider]  methimazole  (TAPAZOLE ) 5 MG tablet Take 1 tablet (5 mg total) by mouth daily. 06/03/23  Yes Terra Fairy PARAS,  PA-C  oxyCODONE -acetaminophen  (PERCOCET/ROXICET) 5-325 MG tablet Take 1 tablet by mouth every 6 (six) hours as needed for severe pain (pain score 7-10). 08/07/23  Yes Janit Thresa HERO, DPM  potassium chloride  (KLOR-CON ) 10 MEQ tablet TAKE 1 TABLET(10 MEQ) BY MOUTH DAILY 01/28/23  Yes Terra Fairy PARAS, PA-C  spironolactone  (ALDACTONE ) 25 MG tablet Take 0.5 tablets (12.5 mg total) by mouth daily. 06/11/23  Yes Vicci Barnie NOVAK, MD  dapagliflozin  propanediol (FARXIGA ) 10 MG TABS tablet TAKE 1 TABLET BY MOUTH DAILY 08/13/23   Gardenia Led, DO    Physical Exam: Vitals:   08/20/23 0225 08/20/23 0607 08/20/23 0739 08/20/23 1048  BP: 95/79 106/62 109/63 101/68  Pulse: (!) 51 (!) 53 (!) 54 (!) 50  Resp: 15 12 14 17   Temp: 98 F (36.7 C)  97.9 F (36.6 C) 98 F (36.7 C)  TempSrc: Oral  Oral Oral  SpO2: 100% 100% 100% 100%  Weight:      Height:       Physical Exam Constitutional:      Appearance: She is normal weight.  HENT:     Head: Normocephalic and atraumatic.     Nose: Nose normal.     Mouth/Throat:     Mouth: Mucous  membranes are moist.  Eyes:     Pupils: Pupils are equal, round, and reactive to light.  Pulmonary:     Effort: Pulmonary effort is normal.  Abdominal:     General: Bowel sounds are normal.  Musculoskeletal:     Comments: Decreased ROM of RLE  See Picture    Skin:    Comments: See picture    Neurological:     General: No focal deficit present.  Psychiatric:        Mood and Affect: Mood normal.     Data Reviewed:  There are no new results to review at this time.  PERIPHERAL VASCULAR CATHETERIZATION Images from the original result were not included.  Patient name: Tomia Enlow MRN: [id number] DOB: 1961-01-31 Sex: female  08/19/2023 Pre-operative Diagnosis: Atherosclerosis native arteries right lower  extremity with gangrene of multiple toes Post-operative diagnosis:  Same Surgeon:  Penne BROCKS. Sheree, MD Procedure Performed: 1.  Percutaneous  ultrasound-guided cannulation left common femoral artery 2.  Catheter in aorta and aortogram with bilateral lower extremity  angiography 3.  Catheter selection of right common femoral artery, right popliteal  artery and right posterior tibial arteries 4.  Stent of right posterior tibial artery and TP trunk with 2.5 x 28mm  Esprit btk 5.  Stent of right SFA with 6 x 40 mm Eluvia 6.  Moderate sedation with fentanyl  and Versed  for 63 minutes  Indications: 63 year old female with significant gangrene of multiple toes  on the right with recent drop in her ABI after previous stenting of her  right external iliac artery as well as balloon angioplasty of the  tibioperoneal trunk and posterior tibial arteries.  She now has evidence  of recurrent disease in the SFA as well as the tibioperoneal trunk and  proximal posterior tibial artery by duplex she is indicated for  angiography with possible invention.  Findings: The aorta was somewhat difficult to visualize the branches but  the celiac and SMA were patent.  Distally there was significant  calcification of the bilateral common and external iliac arteries however  both hypogastric arteries were patent and there was no flow-limiting  stenosis.  There was a right external iliac artery stent which is patent.   Both common femoral arteries and their branches including the SFA and  profunda are patent.  The right lower extremity which is the site of  interest the SFA has approximately 80% stenosis at the adductor and this  was stented to 0% residual stenosis.  Below the knee the anterior tibial  artery is very large and runs off to the distal ankle but does not give  rise to any large dorsalis pedis.  The peroneal artery initially is patent  and then very diminutive towards the ankle and is not visualized.  The  posterior tibial artery initially is a 90% stenosis but is the dominant  flow into the foot.  This was stented to 0% residual  stenosis.  Patient is optimized from a vascular surgery standpoint for amputation of  her toes versus partial foot.   Procedure:  The patient was identified in the holding area and taken to  room 8.  The patient was then placed supine on the table and prepped and  draped in the usual sterile fashion.  A time out was called.  Ultrasound  was used to evaluate the left common femoral artery which was noted be  patent but there was significant posterior plaque.  The area was  anesthetized 1% lidocaine  and  cannulated with a micropuncture needle  followed by wire and sheath using direct ultrasound visualization and  ultrasound images saved to the permanent record.  Concomitantly we  administered fentanyl  and Versed  as moderate sedation her vital signs were  monitored throughout the case.  I placed the Bentson wire under  fluoroscopic guidance followed by 5 French sheath and Omni catheter was  placed to the level of L1 and aortogram was performed followed by pelvic  angiography.  With the above findings we crossed the bifurcation with  Bentson wire and Omni catheter.  We then performed right lower extremity  angiography from the right common femoral artery and with the above  findings were then placed a long 6 French sheath the patient was fully  heparinized.  Using a V18 wire we crossed the SFA on the right and then  below the knee we used a Rubicon catheter and combination of 014 and 018  wires and were ultimately able to cross the posterior tibial artery and  confirmed intraluminal access with a catheter.  We then exchanged for an  014 wire and then balloon dilated the proximal posterior tibial artery and  proximal tibioperoneal trunk with a 2.5 mm coyote and then primarily  stented with 2.5 mm Esprit postdilated with a 2.5 mm Jade and completion  demonstrated distal spasm but ultimately after removing the wire there was  patency.  We turned our attention towards the SFA angiography  demonstrated  the stenosis in the SFA we primarily stented this and postdilated with 5  mm balloon to 0% residual stenosis on completion.  Satisfied with this we  exchanged for a short 6 French sheath in the groin.  The patient tolerated  the procedure without any complication.  Contrast: 85cc  Brandon C. Sheree, MD [facility] [place] Office: [phone] Pager: [phone]  Lab Results  Component Value Date   WBC 8.6 08/19/2023   HGB 12.7 08/19/2023   HCT 43.2 08/19/2023   MCV 93.9 08/19/2023   PLT 439 (H) 08/19/2023   Last metabolic panel Lab Results  Component Value Date   GLUCOSE 96 08/19/2023   NA 143 08/19/2023   K 3.9 08/19/2023   CL 110 08/19/2023   CO2 21 (L) 03/09/2023   BUN 38 (H) 08/19/2023   CREATININE 0.85 08/19/2023   GFRNONAA >60 08/19/2023   CALCIUM  9.0 03/09/2023   PHOS 3.4 03/09/2023   PROT 6.8 03/03/2023   ALBUMIN 3.1 (L) 03/03/2023   LABGLOB 3.8 08/03/2020   AGRATIO 1.2 08/03/2020   BILITOT 0.8 03/03/2023   ALKPHOS 54 03/03/2023   AST 12 (L) 03/03/2023   ALT 8 03/03/2023   ANIONGAP 11 03/09/2023    Assessment and Plan: Gangrene of foot (HCC) Critical Limb Ischemia  PAD  Noted R foot dry gangrene and critical limb ischemia s/p Stent of right posterior tibial artery, TP trunk, Stent of right SFA with Dr. Sheree 08/19/23  Previously seen by Dr. Janit 07/20/2023 with plan for TMA  Dr. Malvin with podiatry consulted by Dr. Sheree Continue with current treatment per vascular surgery recommendations including plavix  and statin  Wounds appear stable clinically at present- defer antibiotics for now pending podiatry evaluation  Prn oxycodone  for pain  Discussed smoking cessation in setting of PAD and limb ischemia  Follow up formal podiatry recommendations     COPD (chronic obstructive pulmonary disease) (HCC) Stable from a respiratory standpoint at present  No active wheezing, cough or hypoxia  Prn duonebs  Discussed  smoking  cessation  Monitor    HFrEF (heart failure with reduced ejection fraction) (HCC) 2D ECHO 07/2023 w/ EF 40-45%, grade 1 diastolic dysfunction, mild elevated PA pressure, mild MR  Appears euvolemic at present  Weight 7/14 was 62.1kg  Cont home regimen including coreg , spironolactone , lasix   Monitor volume status closely    Persistent atrial fibrillation (HCC) Cont home regimen including coreg , tikosyn  Eliquis  on hold perioperatively  Follow up vascular surgery and podiatry recommendations    Former smoker Pt reports still smoking up to 1/4 PPD  Discussed cessation  Nicotine  patch and nicorette  Monitor        Advance Care Planning:   Code Status: Full Code   Consults: Podiatry, Vascular Surgery   Family Communication: No family at the bedside   Severity of Illness: The appropriate patient status for this patient is OBSERVATION. Observation status is judged to be reasonable and necessary in order to provide the required intensity of service to ensure the patient's safety. The patient's presenting symptoms, physical exam findings, and initial radiographic and laboratory data in the context of their medical condition is felt to place them at decreased risk for further clinical deterioration. Furthermore, it is anticipated that the patient will be medically stable for discharge from the hospital within 2 midnights of admission.   Author: Elspeth JINNY Masters, MD 08/20/2023 10:56 AM  For on call review [email or web address].

## 2023-08-20 NOTE — Progress Notes (Signed)
 PHARMACIST LIPID MONITORING   Joanna [name] is a 63 y.o. female admitted on 08/19/2023 with PAD.  Pharmacy has been consulted to optimize lipid-lowering therapy with the indication of secondary prevention for clinical ASCVD.  Recent Labs:  Lipid Panel (last 6 months):   Lab Results  Component Value Date   CHOL 71 08/20/2023   TRIG 74 08/20/2023   HDL 29 (L) 08/20/2023   CHOLHDL 2.4 08/20/2023   VLDL 15 08/20/2023   LDLCALC 27 08/20/2023    Hepatic function panel (last 6 months):   No results found for: AST, ALT, ALKPHOS, BILITOT, BILIDIR, IBILI  SCr (since admission):   Serum creatinine: 0.85 mg/dL 92/85/74 8193 Estimated creatinine clearance: 65.6 mL/min  Current therapy and lipid therapy tolerance Current lipid-lowering therapy: atorvastatin  40mg  Previous lipid-lowering therapies (if applicable): N/A Documented or reported allergies or intolerances to lipid-lowering therapies (if applicable): None  Assessment:   Patient prefers no changes in lipid-lowering therapy at this time due to LDL levels already at goal of < 55.  Plan:    1.Statin intensity (high intensity recommended for all patients regardless of the LDL):  No statin changes. The patient is already on a high intensity statin.  2.Add ezetimibe (if any one of the following):   Not indicated at this time.  3.Refer to lipid clinic:   No  4.Follow-up with:  Primary care provider - Vicci Barnie NOVAK, MD  5.Follow-up labs after discharge:  No changes in lipid therapy, repeat a lipid panel in one year.      Maurilio Patten, PharmD PGY1 Pharmacy Resident [facility]  08/20/2023 8:17 AM

## 2023-08-20 NOTE — Consult Note (Signed)
 PODIATRY CONSULTATION  NAME Joanna [name] MRN [id number] DOB 07/07/1960 DOA 08/19/2023   Reason for consult: Gangrene right foot  Attending/Consulting physician: Dr. Lanis  History of present illness:  This is a 63 y.o. female who is here today for follow up for PAD.  Pt has hx of angiogram with balloon angioplasty of the right TPT, PTA on 03/27/2023 by Dr. Lanis for blue toe syndrome.  She underwent angiogram with right EIA angioplasty and stent on 03/07/2023 for CLI with rest pain by Dr. Gretta.   The pt returns today for follow up and here with her sister.  Toes have continued to demarcate.  They have been followed in the outpatient setting with Dr. Janit, and was last seen on 6/11 with plans for TMA.  TMA is scheduled for the 18th of this month.  She continues to have significant pain.  Denies fever, denies drainage.  Pt has history of gangrene on right  foot. She is now s/p Aortogram, arteriogram of Right PT and TPT and stent of right SFA 1 Day Post-Op. Consulted by vascular surgery to assist with gangrene of right foot. Discussed with patient plan for transmetatarsal  amputation and risks and benefits and she is agreeable to proceed. Family present in agreement as well. All questions answered.   Past Medical History:  Diagnosis Date   Atrial fibrillation with RVR (HCC)    Chronic combined systolic (congestive) and diastolic (congestive) heart failure (HCC)    Dyspnea    Goiter    Hypertension    NICM (nonischemic cardiomyopathy) (HCC)    a. 08/2005 Echo: EF 30-35%, mod diff HK. Mild to Mod MR. Mildly dil LA; b. 08/2005 Cath: Nl Cors. Elevated CO w/o shunt; c. 09/2007 Echo: EF 45%. Mild to mod MR; c. 03/2015 Echo: EF 45%, global HK. Gr1 DD. Mild MR. Mildly dil LA.       Latest Ref Rng & Units 08/19/2023    6:06 PM 08/19/2023   11:07 AM 08/15/2023    6:09 AM  CBC  WBC 4.0 - 10.5 K/uL 8.6     Hemoglobin 12.0 - 15.0 g/dL 87.2  84.6  86.6   Hematocrit 36.0 - 46.0 % 43.2  45.0  39.0    Platelets 150 - 400 K/uL 439          Latest Ref Rng & Units 08/19/2023    6:06 PM 08/19/2023   11:07 AM 08/15/2023    6:09 AM  BMP  Glucose 70 - 99 mg/dL  96  92   BUN 8 - 23 mg/dL  38  56   Creatinine 9.55 - 1.00 mg/dL 9.14  8.89  8.99   Sodium 135 - 145 mmol/L  143  141   Potassium 3.5 - 5.1 mmol/L  3.9  3.8   Chloride 98 - 111 mmol/L  110  106       Physical Exam: Lower Extremity Exam Right foot gangrene dry of the 1-3rd toes of the right  foot that is dry and stable though well demarcated to mpj level.   Prior 5th toe amp  Non palpable DP and pt pulses, though has Right leg  + doppler AT PT and peroneal signals      ASSESSMENT/PLAN OF CARE 63 y.o. female with PMHx significant for  PAD s/p Aortogram, arteriogram of Right PT and TPT and stent of right SFA 1 Day Post-Op with demarcated dry gangrene of the right forefoot 1st - 3rd digits.    - NPO p  MN for OR tomorrow at 1200 for Right foot transmetatarsal amputation, likely with amniotic graft application. Pt agrees to proceed. Consent ordered. - Recommend 2 gm ancef  pre op tomorrow - Anticoagulation: ok to continue her current anticoag per vascular recs - Wound care: none needed pre op - WB status: WBAT pre op, will be NWB in post op shoe following surgery - Will continue to follow   Thank you for the consult.  Please contact me directly with any questions or concerns.           [name], DPM [facility]    [address] [place], [place] [address]                Office [phone]  Fax [phone]

## 2023-08-20 NOTE — Assessment & Plan Note (Signed)
 Pt reports still smoking up to 1/4 PPD  Discussed cessation  Nicotine  patch and nicorette  Monitor

## 2023-08-20 NOTE — Assessment & Plan Note (Addendum)
 Critical Limb Ischemia  PAD  Noted R foot dry gangrene and critical limb ischemia s/p Stent of right posterior tibial artery, TP trunk, Stent of right SFA with Dr. Sheree 08/19/23  Previously seen by Dr. Janit 07/20/2023 with plan for TMA  Dr. Malvin with podiatry consulted by Dr. Sheree Continue with current treatment per vascular surgery recommendations including plavix  and statin  Wounds appear stable clinically at present- defer antibiotics for now pending podiatry evaluation  Prn oxycodone  for pain  Discussed smoking cessation in setting of PAD and limb ischemia  Follow up formal podiatry recommendations

## 2023-08-20 NOTE — Assessment & Plan Note (Signed)
 Stable from a respiratory standpoint at present  No active wheezing, cough or hypoxia  Prn duonebs  Discussed smoking cessation  Monitor

## 2023-08-20 NOTE — Assessment & Plan Note (Addendum)
 2D ECHO 07/2023 w/ EF 40-45%, grade 1 diastolic dysfunction, mild elevated PA pressure, mild MR  Follows Dr. Gardenia outpatient  Appears euvolemic at present  Weight 7/14 was 62.1kg  Cont home regimen including coreg , spironolactone , lasix   Monitor volume status closely

## 2023-08-20 NOTE — Assessment & Plan Note (Signed)
 Cont home regimen including coreg , tikosyn  Eliquis  on hold perioperatively  Follow up vascular surgery and podiatry recommendations

## 2023-08-21 ENCOUNTER — Other Ambulatory Visit: Payer: Self-pay

## 2023-08-21 ENCOUNTER — Encounter (HOSPITAL_COMMUNITY): Payer: Self-pay | Admitting: Family Medicine

## 2023-08-21 ENCOUNTER — Encounter (HOSPITAL_COMMUNITY)
Admission: RE | Disposition: A | Discharge status: Home Health | Payer: Self-pay | Source: Home / Self Care | Attending: Family Medicine

## 2023-08-21 ENCOUNTER — Inpatient Hospital Stay (HOSPITAL_COMMUNITY): Admitting: Anesthesiology

## 2023-08-21 ENCOUNTER — Inpatient Hospital Stay (HOSPITAL_COMMUNITY)

## 2023-08-21 DIAGNOSIS — I5042 Chronic combined systolic (congestive) and diastolic (congestive) heart failure: Secondary | ICD-10-CM | POA: Diagnosis not present

## 2023-08-21 DIAGNOSIS — I739 Peripheral vascular disease, unspecified: Secondary | ICD-10-CM | POA: Diagnosis not present

## 2023-08-21 DIAGNOSIS — I110 Hypertensive heart disease with heart failure: Secondary | ICD-10-CM | POA: Diagnosis not present

## 2023-08-21 DIAGNOSIS — I96 Gangrene, not elsewhere classified: Secondary | ICD-10-CM | POA: Diagnosis not present

## 2023-08-21 DIAGNOSIS — F17210 Nicotine dependence, cigarettes, uncomplicated: Secondary | ICD-10-CM | POA: Diagnosis not present

## 2023-08-21 HISTORY — PX: TRANSMETATARSAL AMPUTATION: SHX6197

## 2023-08-21 LAB — COMPREHENSIVE METABOLIC PANEL WITH GFR
ALT: 8 U/L (ref 0–44)
AST: 11 U/L — ABNORMAL LOW (ref 15–41)
Albumin: 3.1 g/dL — ABNORMAL LOW (ref 3.5–5.0)
Alkaline Phosphatase: 67 U/L (ref 38–126)
Anion gap: 9 (ref 5–15)
BUN: 24 mg/dL — ABNORMAL HIGH (ref 8–23)
CO2: 24 mmol/L (ref 22–32)
Calcium: 9.1 mg/dL (ref 8.9–10.3)
Chloride: 106 mmol/L (ref 98–111)
Creatinine, Ser: 0.83 mg/dL (ref 0.44–1.00)
GFR, Estimated: >60 mL/min (ref >60)
Glucose, Bld: 124 mg/dL — ABNORMAL HIGH (ref 70–99)
Potassium: 3.7 mmol/L (ref 3.5–5.1)
Sodium: 139 mmol/L (ref 135–145)
Total Bilirubin: 0.4 mg/dL (ref 0.0–1.2)
Total Protein: 6.9 g/dL (ref 6.5–8.1)

## 2023-08-21 LAB — CBC
HCT: 31.9 % — ABNORMAL LOW (ref 36.0–46.0)
Hemoglobin: 9.9 g/dL — ABNORMAL LOW (ref 12.0–15.0)
MCH: 27.9 pg (ref 26.0–34.0)
MCHC: 31 g/dL (ref 30.0–36.0)
MCV: 89.9 fL (ref 80.0–100.0)
Platelets: 400 K/uL (ref 150–400)
RBC: 3.55 MIL/uL — ABNORMAL LOW (ref 3.87–5.11)
RDW: 13 % (ref 11.5–15.5)
WBC: 7.7 K/uL (ref 4.0–10.5)
nRBC: 0 % (ref 0.0–0.2)

## 2023-08-21 LAB — MAGNESIUM: Magnesium: 1.8 mg/dL (ref 1.7–2.4)

## 2023-08-21 SURGERY — AMPUTATION, FOOT, TRANSMETATARSAL
Anesthesia: Monitor Anesthesia Care | Site: Toe | Laterality: Right

## 2023-08-21 MED ORDER — APIXABAN 5 MG PO TABS
5.0000 mg | ORAL_TABLET | Freq: Two times a day (BID) | ORAL | Status: DC
  Administered 2023-08-21 – 2023-08-23 (×4): 5 mg via ORAL
  Filled 2023-08-21 (×4): qty 1

## 2023-08-21 MED ORDER — FENTANYL CITRATE (PF) 100 MCG/2ML IJ SOLN
INTRAMUSCULAR | Status: AC
  Administered 2023-08-21: 50 ug
  Filled 2023-08-21: qty 2

## 2023-08-21 MED ORDER — POTASSIUM CHLORIDE CRYS ER 20 MEQ PO TBCR
40.0000 meq | EXTENDED_RELEASE_TABLET | Freq: Once | ORAL | Status: AC
  Administered 2023-08-21: 40 meq via ORAL
  Filled 2023-08-21: qty 2

## 2023-08-21 MED ORDER — FENTANYL CITRATE (PF) 100 MCG/2ML IJ SOLN: 50.0000 ug | Freq: Once | INTRAMUSCULAR | Status: DC

## 2023-08-21 MED ORDER — EPHEDRINE SULFATE-NACL 50-0.9 MG/10ML-% IV SOSY
PREFILLED_SYRINGE | INTRAVENOUS | Status: DC | PRN
  Administered 2023-08-21: 5 mg via INTRAVENOUS
  Administered 2023-08-21: 10 mg via INTRAVENOUS

## 2023-08-21 MED ORDER — CARVEDILOL 3.125 MG PO TABS
3.1250 mg | ORAL_TABLET | Freq: Two times a day (BID) | ORAL | Status: DC
  Administered 2023-08-21 – 2023-08-23 (×5): 3.125 mg via ORAL
  Filled 2023-08-21 (×4): qty 1

## 2023-08-21 MED ORDER — PROPOFOL 10 MG/ML IV BOLUS
INTRAVENOUS | Status: DC | PRN
  Administered 2023-08-21: 20 mg via INTRAVENOUS
  Administered 2023-08-21: 10 mg via INTRAVENOUS
  Administered 2023-08-21: 20 mg via INTRAVENOUS

## 2023-08-21 MED ORDER — CEFAZOLIN SODIUM-DEXTROSE 2-3 GM-%(50ML) IV SOLR
INTRAVENOUS | Status: DC | PRN
  Administered 2023-08-21: 2 g via INTRAVENOUS

## 2023-08-21 MED ORDER — ACETAMINOPHEN 10 MG/ML IV SOLN: 1000.0000 mg | Freq: Once | INTRAVENOUS | Status: DC | PRN

## 2023-08-21 MED ORDER — LIDOCAINE 2% (20 MG/ML) 5 ML SYRINGE
INTRAMUSCULAR | Status: DC | PRN
  Administered 2023-08-21: 20 mg via INTRAVENOUS

## 2023-08-21 MED ORDER — FENTANYL CITRATE (PF) 100 MCG/2ML IJ SOLN: 25.0000 ug | INTRAMUSCULAR | Status: DC | PRN

## 2023-08-21 MED ORDER — MIDAZOLAM HCL 2 MG/2ML IJ SOLN
INTRAMUSCULAR | Status: AC
  Filled 2023-08-21: qty 2

## 2023-08-21 MED ORDER — CHLORHEXIDINE GLUCONATE 0.12 % MT SOLN
15.0000 mL | Freq: Once | OROMUCOSAL | Status: AC
  Administered 2023-08-21: 15 mL via OROMUCOSAL

## 2023-08-21 MED ORDER — LACTATED RINGERS IV SOLN: INTRAVENOUS | Status: DC

## 2023-08-21 MED ORDER — ORAL CARE MOUTH RINSE: 15.0000 mL | Freq: Once | OROMUCOSAL | Status: AC

## 2023-08-21 MED ORDER — LIDOCAINE-EPINEPHRINE (PF) 1.5 %-1:200000 IJ SOLN
INTRAMUSCULAR | Status: DC | PRN
  Administered 2023-08-21: 5 mL via PERINEURAL

## 2023-08-21 MED ORDER — OXYCODONE HCL 5 MG/5ML PO SOLN: 5.0000 mg | Freq: Once | ORAL | Status: DC | PRN

## 2023-08-21 MED ORDER — SODIUM CHLORIDE 0.9 % IR SOLN
Status: DC | PRN
  Administered 2023-08-21: 1000 mL

## 2023-08-21 MED ORDER — BUPIVACAINE-EPINEPHRINE (PF) 0.5% -1:200000 IJ SOLN
INTRAMUSCULAR | Status: DC | PRN
  Administered 2023-08-21: 25 mL via PERINEURAL

## 2023-08-21 MED ORDER — MIDAZOLAM HCL 5 MG/5ML IJ SOLN
INTRAMUSCULAR | Status: DC | PRN
  Administered 2023-08-21: 1 mg via INTRAVENOUS

## 2023-08-21 MED ORDER — PROPOFOL 500 MG/50ML IV EMUL
INTRAVENOUS | Status: DC | PRN
  Administered 2023-08-21: 50 ug/kg/min via INTRAVENOUS

## 2023-08-21 MED ORDER — ONDANSETRON HCL 4 MG/2ML IJ SOLN
INTRAMUSCULAR | Status: DC | PRN
  Administered 2023-08-21: 4 mg via INTRAVENOUS

## 2023-08-21 MED ORDER — OXYCODONE HCL 5 MG PO TABS: 5.0000 mg | ORAL_TABLET | Freq: Once | ORAL | Status: DC | PRN

## 2023-08-21 SURGICAL SUPPLY — 40 items
ALLOGRAFT AMNI BIOVANCE 5X5 1L (Graft) IMPLANT
BLADE AVERAGE 25X9 (BLADE) IMPLANT
BLADE SURG 10 STRL SS (BLADE) ×2 IMPLANT
BLADE SURG 15 STRL LF DISP TIS (BLADE) ×2 IMPLANT
BNDG COHESIVE 3X5 TAN ST LF (GAUZE/BANDAGES/DRESSINGS) ×2 IMPLANT
BNDG COMPR ESMARK 4X3 LF (GAUZE/BANDAGES/DRESSINGS) ×2 IMPLANT
BNDG ELASTIC 3INX 5YD STR LF (GAUZE/BANDAGES/DRESSINGS) ×2 IMPLANT
BNDG ELASTIC 4INX 5YD STR LF (GAUZE/BANDAGES/DRESSINGS) IMPLANT
BNDG GAUZE DERMACEA FLUFF 4 (GAUZE/BANDAGES/DRESSINGS) IMPLANT
CHLORAPREP W/TINT 26 (MISCELLANEOUS) IMPLANT
DRSG ADAPTIC 3X8 NADH LF (GAUZE/BANDAGES/DRESSINGS) IMPLANT
DRSG XEROFORM 1X8 (GAUZE/BANDAGES/DRESSINGS) IMPLANT
ELECTRODE REM PT RTRN 9FT ADLT (ELECTROSURGICAL) ×2 IMPLANT
GAUZE PAD ABD 8X10 STRL (GAUZE/BANDAGES/DRESSINGS) IMPLANT
GAUZE SPONGE 2X2 STRL 8-PLY (GAUZE/BANDAGES/DRESSINGS) IMPLANT
GAUZE SPONGE 4X4 12PLY STRL (GAUZE/BANDAGES/DRESSINGS) ×2 IMPLANT
GAUZE STRETCH 2X75IN STRL (MISCELLANEOUS) ×2 IMPLANT
GAUZE XEROFORM 1X8 LF (GAUZE/BANDAGES/DRESSINGS) ×2 IMPLANT
GLOVE BIO SURGEON STRL SZ7.5 (GLOVE) ×2 IMPLANT
GLOVE BIOGEL PI IND STRL 7.5 (GLOVE) ×2 IMPLANT
GOWN STRL REUS W/ TWL LRG LVL3 (GOWN DISPOSABLE) ×4 IMPLANT
KIT BASIN OR (CUSTOM PROCEDURE TRAY) ×2 IMPLANT
NDL HYPO 25X1 1.5 SAFETY (NEEDLE) ×2 IMPLANT
NEEDLE HYPO 25X1 1.5 SAFETY (NEEDLE) ×1 IMPLANT
PACK ORTHO EXTREMITY (CUSTOM PROCEDURE TRAY) ×2 IMPLANT
PAD ABD 8X10 STRL (GAUZE/BANDAGES/DRESSINGS) IMPLANT
PADDING CAST ABS COTTON 4X4 ST (CAST SUPPLIES) ×4 IMPLANT
SET HNDPC FAN SPRY TIP SCT (DISPOSABLE) IMPLANT
SPIKE FLUID TRANSFER (MISCELLANEOUS) IMPLANT
STAPLER SKIN PROX 35W (STAPLE) IMPLANT
STOCKINETTE 4X48 STRL (DRAPES) IMPLANT
SUT ETHILON 3 0 FSLX (SUTURE) IMPLANT
SUT PROLENE 2 0 FS (SUTURE) IMPLANT
SUT PROLENE 3 0 PS 2 (SUTURE) IMPLANT
SUT PROLENE 4 0 PS 2 18 (SUTURE) IMPLANT
SYR CONTROL 10ML LL (SYRINGE) ×2 IMPLANT
TUBE CONNECTING 12X1/4 (SUCTIONS) IMPLANT
UNDERPAD 30X36 HEAVY ABSORB (UNDERPADS AND DIAPERS) ×2 IMPLANT
WATER STERILE IRR 1000ML POUR (IV SOLUTION) ×2 IMPLANT
YANKAUER SUCT BULB TIP NO VENT (SUCTIONS) IMPLANT

## 2023-08-21 NOTE — Progress Notes (Addendum)
 PROGRESS NOTE    Joanna [name]  FMW:[id number] DOB: 1960/12/04 DOA: 08/19/2023 PCP: Vicci Barnie NOVAK, MD   Brief Narrative:  HPI: Joanna [name] is a 63 y.o. female with medical history significant of atrial fibrillation, chronic HFrEF, hypertension, tobacco abuse, peripheral artery disease presenting with limb ischemia and right foot gangrene.  Patient noted to have recurring issues associate with PAD.  Was recently evaluated by vascular surgery in February of this year secondary to blue toe syndrome.  She underwent angiogram with angioplasty and stent placement January of this year prior to evaluation.  Was noted to have been seen by vascular surgery with loss of decreased flow on ABI.  Was also seen by outpatient podiatry with recommendation of transmetatarsal amputation to deal with issues per report.  Patient still smoking roughly 1/4 pack/day.  Denies any alcohol or illicit drug use.  Was admitted for July 14 for vascular evaluation including stenting of right SFA and posterior tibial artery.  Given persistence of symptoms, recommendations for transmetatarsal habitation.  Vascular surgery reached out to hospice service to take over care with Dr. Lore with vascular surgery reaching out to Dr. Malvin with podiatry to further evaluate.  At present, patient denies any chest pain, shortness of breath.  No nausea or vomiting.  Denies any abdominal pain or diarrhea.  No focal hemiparesis or confusion.  Patient denies any trauma to one side worsening right foot gangrene.  Patient unsure of exact duration of symptoms.  Patient noted to be overall poor historian. Currently on the floor afebrile, hemodynamically stable.  Satting well on room air.  White count 8.6, hemoglobin 12.7, platelets 439, creatinine 1.1.  Assessment & Plan:   Principal Problem:   PAD (peripheral artery disease) (HCC) Active Problems:   Gangrene of foot (HCC)   COPD (chronic obstructive pulmonary disease) (HCC)   Former  smoker   Persistent atrial fibrillation (HCC)   HFrEF (heart failure with reduced ejection fraction) (HCC)   Gangrene of right foot (HCC)  Gangrene of foot (HCC) / Critical Limb Ischemia / PAD, POA: Noted R foot dry gangrene and critical limb ischemia s/p Stent of right posterior tibial artery, TP trunk, Stent of right SFA with Dr. Sheree 08/19/23  Previously seen by Dr. Janit 07/20/2023 with plan for TMA.  Admitted to hospitalist service, seen by Dr. Malvin, underwent transmetatarsal amputation of the right foot 08/21/2023.  Will be seen by PT OT tomorrow.   COPD (chronic obstructive pulmonary disease) (HCC) Stable, no wheezes.  Continue bronchodilators.   HFrEF (heart failure with reduced ejection fraction) (HCC) 2D ECHO 07/2023 w/ EF 40-45%, grade 1 diastolic dysfunction, mild elevated PA pressure, mild MR.  Euvolemic. Cont home regimen including coreg , spironolactone , lasix   Monitor volume status closely   Persistent atrial fibrillation (HCC) Cont home regimen including coreg , tikosyn , rates controlled. Eliquis  on hold, cleared by podiatry to resume tonight.  Will do that.   Smoker/nicotine  dependence: I have discussed tobacco cessation with the patient.  I have counseled the patient regarding the negative impacts of continued tobacco use including but not limited to lung cancer, COPD, and cardiovascular disease.  I have discussed alternatives to tobacco and modalities that may help facilitate tobacco cessation including but not limited to biofeedback, hypnosis, and medications.  Total time spent with tobacco counseling was 5 minutes.  DVT prophylaxis: heparin  injection 5,000 Units Start: 08/19/23 2200   Code Status: Full Code  Family Communication:  None present at bedside.  Plan of care discussed with patient in length  and he/she verbalized understanding and agreed with it.  Status is: Inpatient Remains inpatient appropriate because: Just underwent surgery today.   Estimated body  mass index is 20.23 kg/m (pended) as calculated from the following:   Height as of this encounter: (P) 5' 9 (1.753 m).   Weight as of this encounter: (P) 62.1 kg.    Nutritional Assessment: Body mass index is 20.23 kg/m (pended).. Seen by dietician.  I agree with the assessment and plan as outlined below: Nutrition Status:        . Skin Assessment: I have examined the patient's skin and I agree with the wound assessment as performed by the wound care RN as outlined below:    Consultants:  Orthopedics  Procedures:  As above  Antimicrobials:  Anti-infectives (From admission, onward)    None         Subjective: Seen and examined postoperatively.  Doing well, eating lunch.  She is very happy that the surgery went well.  No complaints.  Objective: Vitals:   08/21/23 1345 08/21/23 1354 08/21/23 1355 08/21/23 1411  BP: 100/61 (!) 96/58  (!) 104/58  Pulse: (!) 50 (!) 54 (!) 53 (!) 51  Resp: 16 16 14 16   Temp:   98.7 F (37.1 C) 98.4 F (36.9 C)  TempSrc:    Oral  SpO2: 100% 100% 100%   Weight:      Height:        Intake/Output Summary (Last 24 hours) at 08/21/2023 1448 Last data filed at 08/21/2023 1355 Gross per 24 hour  Intake 700 ml  Output 375 ml  Net 325 ml   Filed Weights   08/19/23 1017 08/21/23 1026  Weight: 62.1 kg (P) 62.1 kg    Examination:  General exam: Appears calm and comfortable  Respiratory system: Clear to auscultation. Respiratory effort normal. Cardiovascular system: S1 & S2 heard, RRR. No JVD, murmurs, rubs, gallops or clicks. No pedal edema. Gastrointestinal system: Abdomen is nondistended, soft and nontender. No organomegaly or masses felt. Normal bowel sounds heard. Central nervous system: Alert and oriented. No focal neurological deficits. Extremities: Dressing in the right foot. Psychiatry: Judgement and insight appear normal. Mood & affect appropriate.    Data Reviewed: I have personally reviewed following labs and imaging  studies  CBC: Recent Labs  Lab 08/15/23 0609 08/19/23 1107 08/19/23 1806 08/21/23 0311  WBC  --   --  8.6 7.7  HGB 13.3 15.3* 12.7 9.9*  HCT 39.0 45.0 43.2 31.9*  MCV  --   --  93.9 89.9  PLT  --   --  439* 400   Basic Metabolic Panel: Recent Labs  Lab 08/15/23 0609 08/19/23 1107 08/19/23 1806 08/20/23 1407 08/21/23 0311  NA 141 143  --  140 139  K 3.8 3.9  --  4.1 3.7  CL 106 110  --  105 106  CO2  --   --   --  22 24  GLUCOSE 92 96  --  119* 124*  BUN 56* 38*  --  25* 24*  CREATININE 1.00 1.10* 0.85 0.97 0.83  CALCIUM   --   --   --  9.0 9.1  MG  --   --   --   --  1.8   GFR: Estimated Creatinine Clearance: 67.1 mL/min (by C-G formula based on SCr of 0.83 mg/dL). Liver Function Tests: Recent Labs  Lab 08/20/23 1407 08/21/23 0311  AST 13* 11*  ALT 8 8  ALKPHOS 66 67  BILITOT  0.5 0.4  PROT 6.9 6.9  ALBUMIN 3.2* 3.1*   No results for input(s): LIPASE, AMYLASE in the last 168 hours. No results for input(s): AMMONIA in the last 168 hours. Coagulation Profile: No results for input(s): INR, PROTIME in the last 168 hours. Cardiac Enzymes: No results for input(s): CKTOTAL, CKMB, CKMBINDEX, TROPONINI in the last 168 hours. BNP (last 3 results) No results for input(s): PROBNP in the last 8760 hours. HbA1C: No results for input(s): HGBA1C in the last 72 hours. CBG: No results for input(s): GLUCAP in the last 168 hours. Lipid Profile: Recent Labs    08/20/23 0347  CHOL 71  HDL 29*  LDLCALC 27  TRIG 74  CHOLHDL 2.4   Thyroid  Function Tests: No results for input(s): TSH, T4TOTAL, FREET4, T3FREE, THYROIDAB in the last 72 hours. Anemia Panel: No results for input(s): VITAMINB12, FOLATE, FERRITIN, TIBC, IRON, RETICCTPCT in the last 72 hours. Sepsis Labs: No results for input(s): PROCALCITON, LATICACIDVEN in the last 168 hours.  No results found for this or any previous visit (from the past 240 hours).    Radiology Studies: No results found.  Scheduled Meds:  atorvastatin   40 mg Oral Daily   carvedilol   3.125 mg Oral BID WC   clopidogrel   75 mg Oral Daily   dofetilide   125 mcg Oral BID   furosemide   40 mg Oral Daily   heparin   5,000 Units Subcutaneous Q8H   methimazole   5 mg Oral Daily   midazolam        nicotine   7 mg Transdermal Daily   sodium chloride  flush  3 mL Intravenous Q12H   spironolactone   12.5 mg Oral Daily   Continuous Infusions:   LOS: 2 days   Fredia Skeeter, MD [facility]  08/21/2023, 2:48 PM   *Please note that this is a verbal dictation therefore any spelling or grammatical errors are due to the Dragon Medical One system interpretation.  Please page via Amion and do not message via secure chat for urgent patient care matters. Secure chat can be used for non urgent patient care matters.  How to contact the [facility] Attending or Consulting provider 7A - 7P or covering provider during after hours 7P -7A, for this patient?  Check the care team in [facility] and look for a) attending/consulting [facility] provider listed and b) the [facility] team listed. Page or secure chat 7A-7P. Log into [email or web address] and use [facility] universal password to access. If you do not have the password, please contact the hospital operator. Locate the [facility] provider you are looking for under [facility] and page to a number that you can be directly reached. If you still have difficulty reaching the provider, please page the [facility] (Director on Call) for the Hospitalists listed on amion for assistance.

## 2023-08-21 NOTE — Plan of Care (Signed)

## 2023-08-21 NOTE — Progress Notes (Signed)
 Mobility Specialist Progress Note:    08/21/23 0927  Mobility  Activity Ambulated with assistance in hallway  Level of Assistance Contact guard assist, steadying assist  Assistive Device Four wheel walker  Distance Ambulated (ft) 200 ft  Activity Response Tolerated well  Mobility Referral Yes  Mobility visit 1 Mobility  Mobility Specialist Start Time (ACUTE ONLY) [id number]  Mobility Specialist Stop Time (ACUTE ONLY) 0943  Mobility Specialist Time Calculation (min) (ACUTE ONLY) 16 min   Pt received in bed, agreeable to mobility session. Ambulated in hallway with CGA and 4WW. Slightly impulsive and unsteady requiring gait bel for safety. Tolerated well, returned to room/ bed with out fault. Left pt in bed, all needs met.   Rmani Kellogg Mobility Specialist Please contact via Special educational needs teacher or  Rehab office at [phone]

## 2023-08-21 NOTE — Progress Notes (Signed)
 Orthopedic Tech Progress Note Patient Details:  Cachet Mccutchen 04-22-1960 [id number]  Ortho Devices Type of Ortho Device: Postop shoe/boot Ortho Device/Splint Location: RLE Ortho Device/Splint Interventions: Ordered, Application, Adjustment  Put shoe on patient, adjusted straps as needed and left on bedside Post Interventions Patient Tolerated: Well Instructions Provided: Adjustment of device  Camellia Bo 08/21/2023, 3:06 PM

## 2023-08-21 NOTE — Op Note (Addendum)
 Full Operative Report  Date of Operation: 1:04 PM, 08/21/2023   Patient: Joanna [name] - 63 y.o. female  Surgeon: Malvin Marsa FALCON, DPM   Assistant: None  Diagnosis: Gangrene of right foot  Procedure:  1. Transmetatarsal amputation of right foot 2. Application of amniotic graft 5x5 cm, right foot    Anesthesia: Monitor Anesthesia Care  Leopoldo Bruckner, MD  Anesthesiologist: Leopoldo Bruckner, MD CRNA: Jerl Donald LABOR, CRNA   Estimated Blood Loss: 10 mL  Hemostasis: 1) Anatomical dissection, mechanical compression, electrocautery 2) No tourniquet was used  Implants: Implant Name Type Inv. Item Serial No. Manufacturer Lot No. LRB No. Used Action  ALLOGRAFT AMNI BIOVANCE 5X5 1L - [id number] Graft ALLOGRAFT AMNI BIOVANCE 5X5 1L  [employer] [id number] Right 1 Implanted    Materials: prolene 2-0, 3-0 and skin staples  Injectables: 1) Pre-operatively:  Pre op nerve block per anesthesia 2) Post-operatively: None   Specimens: - Pathology: Right forefoot for pathology - Microbiology: None taken   Antibiotics: I2 gm ancef  IV given pre op  Drains: None  Complications: Patient tolerated the procedure well without complication.   Operative findings: As below in detailed report  Indications for Procedure: Joanna [name] presents to [place], Marsa FALCON, DPM with a chief complaint of gangrene of the toes 1-3 of the right foot. The patient has failed conservative treatments of various modalities. At this time the patient has elected to proceed with surgical correction. All alternatives, risks, and complications of the procedures were thoroughly explained to the patient. Patient exhibits appropriate understanding of all discussion points and informed consent was signed and obtained in the chart with no guarantees to surgical outcome given or implied.  Description of Procedure: Patient was brought to the operating room. Patient remained on their hospital bed in  the supine position. A surgical timeout was performed and all members of the operating room, the procedure, and the surgical site were identified. anesthesia occurred as per anesthesia record. Local anesthetic as previously described was then injected about the operative field in a local infiltrative block.  The operative lower extremity as noted above was then prepped and draped in the usual sterile manner. The following procedure then began.  Attention was directed to the RIGHT lower extremity. A fish-mouth type incision was made proximal to the web spaces and encompassed the entire forefoot. The full-thickness incision was made with a longer plantar flap to allow for wound closure. The incision was continued through the soft tissue down to the shafts of the metatarsal bones. A 15 blade was then used to free up the periosteum on all the metatarsal shafts. Using an oscillating saw, the metatarsals were cut in a dorsal distal to plantar proximal orientation. The first metatarsal was beveled so that the medial cortex was shorter than the lateral, and the fifth metatarsal was beveled so that the lateral cortex was shorter than the medial, thus less prominent. The amputation was done so that a metatarsal parabola was maintained.  The distal portion including all the digits were freed from the metatarsals and soft tissue attachments. The specimen was passed off the field and sent for gross pathology. All remaining non-viable and necrotic tissues were sharply resected and removed. Extensor and flexors tendons were grasped with a hemostat and cut proximally. Good bleeding was noted and the tissue flaps appeared well perfused. The surgical site was then flushed with 1000ml of saline under power pulse lavage.  Next to promote healing at the amputation site given at risk site for healing  due to PAD, decision was made to apply amniotic graft. A single layer 5x5 cm biovance amniotic graft was applied to the amputation site  centrally to promote healing.  The plantar flap was brought in approximation with the dorsal flap and the sutures material previously described was used for closure. Care was taken not to place the flaps under tension in order not to jeopardize the vascular supply.  The surgical site was then dressed with xerform 4x4 gauze, abd pad, kerlix and ace wrap. The patient tolerated both the procedure and anesthesia well with vital signs stable throughout. The patient was transferred in good condition and all vital signs stable  from the OR to recovery under the discretion of anesthesia.  Condition: Vital signs stable, neurovascular status unchanged from preoperative   Surgical plan:  Expect clean margin. Good bleeding and intact vascular supply to plantar and dorsal flap. NWB in post op shoe for protection. PT ot. Ok to continue anticoag per vascular recs.   The patient will be NWB in a post op shoe to the operative limb until further instructed. The dressing is to remain clean, dry, and intact. Will continue to follow unless noted elsewhere.   Marsa Honour, DPM [facility]

## 2023-08-21 NOTE — Progress Notes (Signed)
 History and Physical Interval Note:  08/21/2023 11:28 AM  Heron Mace  has presented today for surgery, with the diagnosis of gangrene of right forefoot.  The various methods of treatment have been discussed with the patient and family. After consideration of risks, benefits and other options for treatment, the patient has consented to   Procedure(s) with comments: AMPUTATION, FOOT, TRANSMETATARSAL (Right) - Right foot TMA as a surgical intervention.  The patient's history has been reviewed, patient examined, no change in status, stable for surgery.  I have reviewed the patient's chart and labs.  Questions were answered to the patient's satisfaction.     Marsa FALCON Wilder Kurowski

## 2023-08-21 NOTE — Plan of Care (Signed)
   Problem: Clinical Measurements: Goal: Will remain free from infection Outcome: Progressing Goal: Diagnostic test results will improve Outcome: Progressing

## 2023-08-21 NOTE — Anesthesia Preprocedure Evaluation (Signed)
 Anesthesia Evaluation  Patient identified by MRN, date of birth, ID band Patient awake    Reviewed: Allergy & Precautions, NPO status , Patient's Chart, lab work & pertinent test results  History of Anesthesia Complications Negative for: history of anesthetic complications  Airway Mallampati: I  TM Distance: >3 FB Neck ROM: Full    Dental  (+) Dental Advisory Given, Missing   Pulmonary shortness of breath, COPD, neg recent URI, Current Smoker and Patient abstained from smoking.   breath sounds clear to auscultation       Cardiovascular hypertension, (-) angina + Peripheral Vascular Disease and +CHF   Rhythm:Regular   1. Left ventricular ejection fraction, by estimation, is 40 to 45%. The  left ventricle has mildly decreased function. The left ventricle  demonstrates global hypokinesis. Left ventricular diastolic parameters are  consistent with Grade I diastolic  dysfunction (impaired relaxation).   2. Right ventricular systolic function is normal. The right ventricular  size is normal. There is mildly elevated pulmonary artery systolic  pressure.   3. Left atrial size was mildly dilated.   4. The mitral valve is normal in structure. Mild mitral valve  regurgitation. No evidence of mitral stenosis.   5. The aortic valve is tricuspid. Aortic valve regurgitation is not  visualized. No aortic stenosis is present.   6. The inferior vena cava is normal in size with <50% respiratory  variability, suggesting right atrial pressure of 8 mmHg.     Neuro/Psych negative neurological ROS  negative psych ROS   GI/Hepatic negative GI ROS, Neg liver ROS,,,  Endo/Other  Lab Results      Component                Value               Date                      HGBA1C                   5.4                 03/08/2018             Renal/GU Lab Results      Component                Value               Date                      NA                        139                 08/21/2023                K                        3.7                 08/21/2023                CO2                      24                  08/21/2023  GLUCOSE                  124 (H)             08/21/2023                BUN                      24 (H)              08/21/2023                CREATININE               0.83                08/21/2023                CALCIUM                   9.1                 08/21/2023                EGFR                     56 (L)              04/17/2022                GFRNONAA                 >60                 08/21/2023                Musculoskeletal  Gangrene of right foot   Abdominal   Peds  Hematology   Anesthesia Other Findings   Reproductive/Obstetrics                              Anesthesia Physical Anesthesia Plan  ASA: 3  Anesthesia Plan: MAC and Regional   Post-op Pain Management: Regional block*   Induction: Intravenous  PONV Risk Score and Plan: 1 and Propofol  infusion and Treatment may vary due to age or medical condition  Airway Management Planned: Nasal Cannula, Natural Airway and Simple Face Mask  Additional Equipment: None  Intra-op Plan:   Post-operative Plan:   Informed Consent: I have reviewed the patients History and Physical, chart, labs and discussed the procedure including the risks, benefits and alternatives for the proposed anesthesia with the patient or authorized representative who has indicated his/her understanding and acceptance.     Dental advisory given  Plan Discussed with: CRNA  Anesthesia Plan Comments:          Anesthesia Quick Evaluation

## 2023-08-21 NOTE — Plan of Care (Signed)
 ?  Problem: Clinical Measurements: ?Goal: Will remain free from infection ?Outcome: Progressing ?  ?

## 2023-08-21 NOTE — Transfer of Care (Signed)
 Immediate Anesthesia Transfer of Care Note  Patient: Joanna [name]  Procedure(s) Performed: AMPUTATION, FOOT, TRANSMETATARSAL (Right: Toe)  Patient Location: PACU  Anesthesia Type:MAC and Regional  Level of Consciousness: awake  Airway & Oxygen Therapy: Patient Spontanous Breathing  Post-op Assessment: Report given to RN and Post -op Vital signs reviewed and stable  Post vital signs: Reviewed and stable  Last Vitals:  Vitals Value Taken Time  BP 97/60 08/21/23 13:05  Temp    Pulse 55 08/21/23 13:06  Resp 15 08/21/23 13:06  SpO2 100 % 08/21/23 13:06  Vitals shown include unfiled device data.  Last Pain:  Vitals:   08/21/23 1035  TempSrc: Oral  PainSc: 3          Complications: No notable events documented.

## 2023-08-21 NOTE — Anesthesia Procedure Notes (Signed)
 Anesthesia Regional Block: Popliteal block   Pre-Anesthetic Checklist: , timeout performed,  Correct Patient, Correct Site, Correct Laterality,  Correct Procedure, Correct Position, site marked,  Risks and benefits discussed,  Surgical consent,  Pre-op evaluation,  At surgeon's request and post-op pain management  Laterality: Right and Lower  Prep: chloraprep       Needles:  Injection technique: Single-shot      Needle Length: 9cm  Needle Gauge: 22     Additional Needles: Arrow StimuQuik ECHO Echogenic Stimulating PNB Needle  Procedures:,,,, ultrasound used (permanent image in chart),,    Narrative:  Start time: 08/21/2023 11:24 AM End time: 08/21/2023 11:29 AM Injection made incrementally with aspirations every 5 mL.  Performed by: Personally  Anesthesiologist: Leopoldo Bruckner, MD

## 2023-08-22 DIAGNOSIS — I739 Peripheral vascular disease, unspecified: Secondary | ICD-10-CM | POA: Diagnosis not present

## 2023-08-22 LAB — BASIC METABOLIC PANEL WITH GFR
Anion gap: 8 (ref 5–15)
BUN: 24 mg/dL — ABNORMAL HIGH (ref 8–23)
CO2: 27 mmol/L (ref 22–32)
Calcium: 9.1 mg/dL (ref 8.9–10.3)
Chloride: 105 mmol/L (ref 98–111)
Creatinine, Ser: 0.82 mg/dL (ref 0.44–1.00)
GFR, Estimated: >60 mL/min (ref >60)
Glucose, Bld: 111 mg/dL — ABNORMAL HIGH (ref 70–99)
Potassium: 4.6 mmol/L (ref 3.5–5.1)
Sodium: 140 mmol/L (ref 135–145)

## 2023-08-22 LAB — CBC WITH DIFFERENTIAL/PLATELET
Abs Immature Granulocytes: 0.04 K/uL (ref 0.00–0.07)
Basophils Absolute: 0 K/uL (ref 0.0–0.1)
Basophils Relative: 0 %
Eosinophils Absolute: 0.1 K/uL (ref 0.0–0.5)
Eosinophils Relative: 1 %
HCT: 32.2 % — ABNORMAL LOW (ref 36.0–46.0)
Hemoglobin: 10.1 g/dL — ABNORMAL LOW (ref 12.0–15.0)
Immature Granulocytes: 0 %
Lymphocytes Relative: 22 %
Lymphs Abs: 2.5 K/uL (ref 0.7–4.0)
MCH: 28.5 pg (ref 26.0–34.0)
MCHC: 31.4 g/dL (ref 30.0–36.0)
MCV: 90.7 fL (ref 80.0–100.0)
Monocytes Absolute: 0.9 K/uL (ref 0.1–1.0)
Monocytes Relative: 8 %
Neutro Abs: 7.8 K/uL — ABNORMAL HIGH (ref 1.7–7.7)
Neutrophils Relative %: 69 %
Platelets: 406 K/uL — ABNORMAL HIGH (ref 150–400)
RBC: 3.55 MIL/uL — ABNORMAL LOW (ref 3.87–5.11)
RDW: 13 % (ref 11.5–15.5)
WBC: 11.3 K/uL — ABNORMAL HIGH (ref 4.0–10.5)
nRBC: 0 % (ref 0.0–0.2)

## 2023-08-22 MED ORDER — OXYCODONE HCL ER 10 MG PO T12A
10.0000 mg | EXTENDED_RELEASE_TABLET | Freq: Two times a day (BID) | ORAL | Status: DC
  Administered 2023-08-22 – 2023-08-23 (×3): 10 mg via ORAL
  Filled 2023-08-22 (×4): qty 1

## 2023-08-22 MED ORDER — MAGNESIUM SULFATE 2 GM/50ML IV SOLN
2.0000 g | Freq: Once | INTRAVENOUS | Status: AC
  Administered 2023-08-22: 2 g via INTRAVENOUS
  Filled 2023-08-22: qty 50

## 2023-08-22 MED ORDER — DOXYCYCLINE HYCLATE 100 MG PO TABS
100.0000 mg | ORAL_TABLET | Freq: Two times a day (BID) | ORAL | Status: DC
  Administered 2023-08-22 – 2023-08-23 (×3): 100 mg via ORAL
  Filled 2023-08-22 (×3): qty 1

## 2023-08-22 NOTE — Evaluation (Signed)
 Physical Therapy Evaluation Patient Details Name: Joanna [name] MRN: [id number] DOB: 02-04-1961 Today's Date: 08/22/2023  History of Present Illness  Pt is a 63 y.o. female admitted 7/14 for scheduled abdominal aortogram and BLE angiography. S/p transmetatarsal amputation of R foot 7/16.  PMH: PAD, a-fib, CHF, COPD  Clinical Impression  Patient is s/p above surgery resulting in functional limitations due to the deficits listed below (see PT Problem List). Reports good family support at home and independent with mobility PTA. Able to ambulate throughout room today including threshold navigation to simulate home environment. States her RW is no longer functional and will benefit from a new one at D/c in order to safely mobilize. VSS throughout. Maintains NWB on RLE with all activities performed. Reviewed use of post-op shoe. Will follow acutely. Patient will benefit from acute skilled PT to increase their independence and safety with mobility to facilitate discharge.         If plan is discharge home, recommend the following: A little help with walking and/or transfers;A little help with bathing/dressing/bathroom;Assistance with cooking/housework;Assist for transportation;Help with stairs or ramp for entrance   Can travel by private vehicle        Equipment Recommendations Rolling walker (2 wheels)  Recommendations for Other Services       Functional Status Assessment Patient has had a recent decline in their functional status and demonstrates the ability to make significant improvements in function in a reasonable and predictable amount of time.     Precautions / Restrictions Precautions Precautions: Fall Recall of Precautions/Restrictions: Intact Required Braces or Orthoses: Other Brace Other Brace: R post-op shoe Restrictions Weight Bearing Restrictions Per Provider Order: Yes RLE Weight Bearing Per Provider Order: Non weight bearing      Mobility  Bed Mobility Overal bed  mobility: Modified Independent             General bed mobility comments: Able to get herself back in bed. Educated on RLE elevation for edema control    Transfers Overall transfer level: Needs assistance Equipment used: Rolling walker (2 wheels) Transfers: Sit to/from Stand Sit to Stand: Supervision           General transfer comment: Supervision for safety, cues for hand placement when rising and sitting. no physical assist. Stable with RW for support.    Ambulation/Gait Ambulation/Gait assistance: Supervision Gait Distance (Feet): 35 Feet Assistive device: Rolling walker (2 wheels) Gait Pattern/deviations: Trunk flexed (hop) Gait velocity: dec Gait velocity interpretation: <1.31 ft/sec, indicative of household ambulator   General Gait Details: Cues for safe AD use with RW. Adjusted for safety and efficiency. Cues for upright posture and step length into RW. No overt LOB. Slow with turns and backing up but stable. Maintains NWB throughout duration of distance without any issues.  Stairs Stairs: Yes Stairs assistance: Contact guard assist Stair Management: No rails, Step to pattern, Forwards, With walker Number of Stairs: 1 General stair comments: Educated on single threshold navigation with RW and NWB precautions. CGA for RW stability and safety. Able to step up forward, and down forward with RW for support and no loss of balance. Pt feels confident with task and states brother can guard at home.  Wheelchair Mobility     Tilt Bed    Modified Rankin (Stroke Patients Only)       Balance Overall balance assessment: Needs assistance Sitting-balance support: No upper extremity supported, Feet supported Sitting balance-Leahy Scale: Good     Standing balance support: During functional activity, Reliant on assistive  device for balance, Single extremity supported Standing balance-Leahy Scale: Poor Standing balance comment: Needs single UE for balance while NWB on  Rt.                             Pertinent Vitals/Pain Pain Assessment Pain Assessment: Faces Faces Pain Scale: Hurts even more Pain Location: RLE Pain Descriptors / Indicators: Discomfort Pain Intervention(s): Monitored during session, Repositioned, Patient requesting pain meds-RN notified, Limited activity within patient's tolerance    Home Living Family/patient expects to be discharged to:: Private residence Living Arrangements: Other relatives Available Help at Discharge: Family;Available 24 hours/day Type of Home: House Home Access: Level entry       Home Layout: One level (Reports threshold to get into room) Home Equipment: Cane - single point;BSC/3in1 (RW no longer functional per pt.) Additional Comments: Lives with brother and sister, multiple family members available to assist upon d/c    Prior Function Prior Level of Function : Independent/Modified Independent               ADLs Comments: Manages own medications, can make light meals     Extremity/Trunk Assessment   Upper Extremity Assessment Upper Extremity Assessment: Defer to OT evaluation    Lower Extremity Assessment Lower Extremity Assessment: Generalized weakness (RLE with bandaging to Rt residual foot)    Cervical / Trunk Assessment Cervical / Trunk Assessment: Normal  Communication   Communication Communication: Impaired Factors Affecting Communication: Hearing impaired    Cognition Arousal: Alert Behavior During Therapy: WFL for tasks assessed/performed   PT - Cognitive impairments: No apparent impairments                         Following commands: Intact       Cueing Cueing Techniques: Verbal cues     General Comments General comments (skin integrity, edema, etc.): VSS    Exercises General Exercises - Lower Extremity Ankle Circles/Pumps: AROM, Right, 10 reps, Supine   Assessment/Plan    PT Assessment Patient needs continued PT services  PT Problem  List Decreased strength;Decreased range of motion;Decreased activity tolerance;Decreased balance;Decreased mobility;Decreased knowledge of use of DME;Decreased knowledge of precautions;Pain       PT Treatment Interventions DME instruction;Gait training;Stair training;Functional mobility training;Therapeutic activities;Therapeutic exercise;Balance training;Neuromuscular re-education;Patient/family education;Modalities    PT Goals (Current goals can be found in the Care Plan section)  Acute Rehab PT Goals Patient Stated Goal: Get well, go home, reduce pain PT Goal Formulation: With patient Time For Goal Achievement: 09/05/23 Potential to Achieve Goals: Good    Frequency Min 2X/week     Co-evaluation               AM-PAC PT 6 Clicks Mobility  Outcome Measure Help needed turning from your back to your side while in a flat bed without using bedrails?: None Help needed moving from lying on your back to sitting on the side of a flat bed without using bedrails?: None Help needed moving to and from a bed to a chair (including a wheelchair)?: A Little Help needed standing up from a chair using your arms (e.g., wheelchair or bedside chair)?: A Little Help needed to walk in hospital room?: A Little Help needed climbing 3-5 steps with a railing? : A Little 6 Click Score: 20    End of Session Equipment Utilized During Treatment: Gait belt Activity Tolerance: Patient tolerated treatment well Patient left: in bed;with call bell/phone within  reach;with bed alarm set (RLE elevated) Nurse Communication:  [employer] desk and NT notified pt requesting pain medication.) PT Visit Diagnosis: Unsteadiness on feet (R26.81);Other abnormalities of gait and mobility (R26.89);Muscle weakness (generalized) (M62.81);Pain Pain - Right/Left: Right Pain - part of body: Ankle and joints of foot    Time: 9051-8995 PT Time Calculation (min) (ACUTE ONLY): 16 min   Charges:   PT Evaluation $PT Eval Low  Complexity: 1 Low   PT General Charges $$ ACUTE PT VISIT: 1 Visit         Leontine Roads, PT, DPT [facility]  Rehabilitation Services Physical Therapist Office: [phone] Website: [email or web address]   Leontine GORMAN Roads 08/22/2023, 10:41 AM

## 2023-08-22 NOTE — Evaluation (Signed)
 Occupational Therapy Evaluation Patient Details Name: Joanna [name] MRN: [id number] DOB: 07/04/1960 Today's Date: 08/22/2023   History of Present Illness   Pt is a 63 y.o. female admitted 7/14 for scheduled abdominal aortogram and BLE angiography. S/p transmetatarsal amputation of R foot 7/16.  PMH: PAD, a-fib, CHF, COPD     Clinical Impressions Pt admitted based on above, and was seen based on problem list below. PTA pt was independent with ADLs and IADLs. Today pt is requiring set up  to CGA for ADLs. Bed mobility was Mod I and functional transfers are  CGA with use of RW. Pt with adequate family support available upon d/c, no follow up OT or DME needs. OT will continue to follow acutely to maximize functional independence.        If plan is discharge home, recommend the following:   A little help with walking and/or transfers;A little help with bathing/dressing/bathroom;Assistance with cooking/housework     Functional Status Assessment   Patient has had a recent decline in their functional status and demonstrates the ability to make significant improvements in function in a reasonable and predictable amount of time.     Equipment Recommendations   None recommended by OT      Precautions/Restrictions   Precautions Precautions: Fall Recall of Precautions/Restrictions: Intact Required Braces or Orthoses: Other Brace Other Brace: R post-op shoe Restrictions Weight Bearing Restrictions Per Provider Order: Yes RLE Weight Bearing Per Provider Order: Non weight bearing     Mobility Bed Mobility Overal bed mobility: Modified Independent     General bed mobility comments: Increased time, HOB elevated    Transfers Overall transfer level: Needs assistance Equipment used: Rolling walker (2 wheels) Transfers: Sit to/from Stand, Bed to chair/wheelchair/BSC Sit to Stand: Contact guard assist     Step pivot transfers: Contact guard assist     General transfer  comment: Inital STS required cues for NWB, hop pivot to recliner CGA for balance      Balance Overall balance assessment: Needs assistance Sitting-balance support: No upper extremity supported, Feet supported Sitting balance-Leahy Scale: Good     Standing balance support: Bilateral upper extremity supported, During functional activity, Reliant on assistive device for balance Standing balance-Leahy Scale: Fair Standing balance comment: Able to reach outside of BOS with single UE supported       ADL either performed or assessed with clinical judgement   ADL Overall ADL's : Needs assistance/impaired Eating/Feeding: Set up;Sitting   Grooming: Set up;Sitting           Upper Body Dressing : Set up;Sitting   Lower Body Dressing: Contact guard assist;Sit to/from stand   Toilet Transfer: Contact guard assist;Rolling walker (2 wheels) Toilet Transfer Details (indicate cue type and reason): Simulated in room short distance hop pivot Toileting- Clothing Manipulation and Hygiene: Contact guard assist;Sit to/from stand       Functional mobility during ADLs: Contact guard assist;Rolling walker (2 wheels) General ADL Comments: Adequate standing balance for standing ADLs     Vision Baseline Vision/History: 0 No visual deficits Vision Assessment?: No apparent visual deficits            Pertinent Vitals/Pain Pain Assessment Pain Assessment: Faces Faces Pain Scale: Hurts little more Pain Location: RLE Pain Descriptors / Indicators: Discomfort Pain Intervention(s): Monitored during session     Extremity/Trunk Assessment Upper Extremity Assessment Upper Extremity Assessment: Overall WFL for tasks assessed   Lower Extremity Assessment Lower Extremity Assessment: Defer to PT evaluation   Cervical / Trunk Assessment Cervical /  Trunk Assessment: Normal   Communication Communication Communication: No apparent difficulties Factors Affecting Communication: Hearing impaired    Cognition Arousal: Alert Behavior During Therapy: WFL for tasks assessed/performed Cognition: No apparent impairments     OT - Cognition Comments: Poor health literacy       Following commands: Intact       Cueing  General Comments   Cueing Techniques: Verbal cues  VSS on RA           Home Living Family/patient expects to be discharged to:: Private residence Living Arrangements: Other relatives Available Help at Discharge: Family;Available 24 hours/day Type of Home: House Home Access: Level entry     Home Layout: One level (Reports threshold to get into room)     Bathroom Shower/Tub: Chief Strategy Officer: Standard Bathroom Accessibility: Yes How Accessible: Accessible via walker Home Equipment: Rolling Walker (2 wheels);Cane - single point;BSC/3in1   Additional Comments: Lives with brother and sister, multiple family members available to assist upon d/c      Prior Functioning/Environment Prior Level of Function : Independent/Modified Independent       ADLs Comments: Manages own medications, can make light meals    OT Problem List: Decreased strength;Decreased range of motion;Decreased activity tolerance;Impaired balance (sitting and/or standing);Cardiopulmonary status limiting activity   OT Treatment/Interventions: Self-care/ADL training;Therapeutic exercise;Energy conservation;DME and/or AE instruction;Therapeutic activities;Patient/family education;Balance training      OT Goals(Current goals can be found in the care plan section)   Acute Rehab OT Goals Patient Stated Goal: To go home OT Goal Formulation: With patient Time For Goal Achievement: 09/05/23 Potential to Achieve Goals: Good   OT Frequency:  Min 2X/week       AM-PAC OT 6 Clicks Daily Activity     Outcome Measure Help from another person eating meals?: None Help from another person taking care of personal grooming?: A Little Help from another person toileting,  which includes using toliet, bedpan, or urinal?: A Little Help from another person bathing (including washing, rinsing, drying)?: A Little Help from another person to put on and taking off regular upper body clothing?: A Little Help from another person to put on and taking off regular lower body clothing?: A Little 6 Click Score: 19   End of Session Equipment Utilized During Treatment: Gait belt;Rolling walker (2 wheels);Other (comment) Nurse Communication: Mobility status  Activity Tolerance: Patient tolerated treatment well Patient left: in chair;with call bell/phone within reach  OT Visit Diagnosis: Unsteadiness on feet (R26.81);Other abnormalities of gait and mobility (R26.89);Muscle weakness (generalized) (M62.81)                Time: 9175-9143 OT Time Calculation (min): 32 min Charges:  OT General Charges $OT Visit: 1 Visit OT Evaluation $OT Eval Moderate Complexity: 1 Mod OT Treatments $Self Care/Home Management : 8-22 mins  Joanna [name], OT  Acute Rehabilitation Services Office [phone] Secure chat preferred   Joanna [name] 08/22/2023, 9:39 AM

## 2023-08-22 NOTE — Progress Notes (Signed)
  Subjective:  Patient ID: Joanna [name], female    DOB: 1960/03/12,  MRN: [id number]   DOS: 08/21/23 Procedure: 1. Transmetatarsal amputation of right foot 2. Application of amniotic graft 5x5 cm, right foot  63 y.o. female seen for post op check. She reports pain started overnight on the right foot. Aware of need to be non weightbearing to right foot.   Review of Systems: Negative except as noted in the HPI. Denies N/V/F/Ch.   Objective:   Constitutional Well developed. Well nourished.  Vascular Foot warm and well perfused. Capillary refill normal to all digits.   No calf pain with palpation  Neurologic Normal speech. Oriented to person, place, and time. Epicritic sensation intact  Dermatologic Dressing C/D/I  Orthopedic: S/p R foot TMA   Radiographs: Interval amputation of the first through fifth proximal metatarsals.   Pathology: pending  Micro: None  Assessment:   1. Critical limb ischemia of right lower extremity with ulceration of lower leg (HCC)   Gangrene of right forefoot s/p Transmetatarsal amputation  Plan:  Patient was evaluated and treated and all questions answered.  POD # 1 s/p R foot TMA with graft application. -Progressing as expected post operatively. Continue pain control. PT ot today. NWB RLE -XR: expected post op changes -WB Status: NWB in post op shoe for protection when up with PT, does not need to wear in bed -Sutures: remain intact 2-3 weeks. -Medications/ABX: recommend 5 days doxycycline  post op  -Dressing: leave C/D/I for now - F/u Plan: will follow         Joanna [name], DPM [facility] / [facility]

## 2023-08-22 NOTE — Progress Notes (Signed)
 PROGRESS NOTE    Joanna [name]  FMW:[id number] DOB: 07-Mar-1960 DOA: 08/19/2023 PCP: Vicci Barnie NOVAK, MD   Brief Narrative:  HPI: Joanna [name] is a 63 y.o. female with medical history significant of atrial fibrillation, chronic HFrEF, hypertension, tobacco abuse, peripheral artery disease presenting with limb ischemia and right foot gangrene.  Patient noted to have recurring issues associate with PAD.  Was recently evaluated by vascular surgery in February of this year secondary to blue toe syndrome.  She underwent angiogram with angioplasty and stent placement January of this year prior to evaluation.  Was noted to have been seen by vascular surgery with loss of decreased flow on ABI.  Was also seen by outpatient podiatry with recommendation of transmetatarsal amputation to deal with issues per report.  Patient still smoking roughly 1/4 pack/day.  Denies any alcohol or illicit drug use.  Was admitted for July 14 for vascular evaluation including stenting of right SFA and posterior tibial artery.  Given persistence of symptoms, recommendations for transmetatarsal habitation.  Vascular surgery reached out to hospice service to take over care with Dr. Lore with vascular surgery reaching out to Dr. Malvin with podiatry to further evaluate.  At present, patient denies any chest pain, shortness of breath.  No nausea or vomiting.  Denies any abdominal pain or diarrhea.  No focal hemiparesis or confusion.  Patient denies any trauma to one side worsening right foot gangrene.  Patient unsure of exact duration of symptoms.  Patient noted to be overall poor historian. Currently on the floor afebrile, hemodynamically stable.  Satting well on room air.  White count 8.6, hemoglobin 12.7, platelets 439, creatinine 1.1.  Assessment & Plan:   Principal Problem:   PAD (peripheral artery disease) (HCC) Active Problems:   Gangrene of foot (HCC)   COPD (chronic obstructive pulmonary disease) (HCC)   Former  smoker   Persistent atrial fibrillation (HCC)   HFrEF (heart failure with reduced ejection fraction) (HCC)   Gangrene of right foot (HCC)  Gangrene of foot (HCC) / Critical Limb Ischemia / PAD, POA: Noted R foot dry gangrene and critical limb ischemia s/p Stent of right posterior tibial artery, TP trunk, Stent of right SFA with Dr. Sheree 08/19/23  Previously seen by Dr. Janit 07/20/2023 with plan for TMA.  Admitted to hospitalist service, seen by Dr. Malvin, underwent transmetatarsal amputation of the right foot 08/21/2023.  Her pain is excruciating, 10 out of 10.  I will start her on oxycodone  long-acting 10 mg twice daily, continue immediate release as needed.  Needs to be seen by PT OT.  Not ready for discharge today.  Podiatry recommends 5 days of doxycycline  100 mg p.o. twice daily starting today.  I have ordered those.   COPD (chronic obstructive pulmonary disease) (HCC) Stable, no wheezes.  Continue bronchodilators.   HFrEF (heart failure with reduced ejection fraction) (HCC) 2D ECHO 07/2023 w/ EF 40-45%, grade 1 diastolic dysfunction, mild elevated PA pressure, mild MR.  Euvolemic. Cont home regimen including coreg , spironolactone , lasix   Monitor volume status closely   Persistent atrial fibrillation (HCC) Cont home regimen including coreg , tikosyn , rates controlled. Eliquis  resumed on the evening of 08/21/2023 after clearance from podiatry.   Smoker/nicotine  dependence: I have discussed tobacco cessation with the patient.  I have counseled the patient regarding the negative impacts of continued tobacco use including but not limited to lung cancer, COPD, and cardiovascular disease.  I have discussed alternatives to tobacco and modalities that may help facilitate tobacco cessation including but not  limited to biofeedback, hypnosis, and medications.  Total time spent with tobacco counseling was 5 minutes.  DVT prophylaxis: Eliquis    Code Status: Full Code  Family Communication:  None  present at bedside.  Plan of care discussed with patient in length and he/she verbalized understanding and agreed with it.  Status is: Inpatient Remains inpatient appropriate because: Uncontrolled and excruciating pain.  Needs to be seen by PT OT.   Estimated body mass index is 20.23 kg/m (pended) as calculated from the following:   Height as of this encounter: (P) 5' 9 (1.753 m).   Weight as of this encounter: (P) 62.1 kg.    Nutritional Assessment: Body mass index is 20.23 kg/m (pended).. Seen by dietician.  I agree with the assessment and plan as outlined below: Nutrition Status:        . Skin Assessment: I have examined the patient's skin and I agree with the wound assessment as performed by the wound care RN as outlined below:    Consultants:  Orthopedics  Procedures:  As above  Antimicrobials:  Anti-infectives (From admission, onward)    Start     Dose/Rate Route Frequency Ordered Stop   08/22/23 1000  doxycycline  (VIBRA -TABS) tablet 100 mg        100 mg Oral Every 12 hours 08/22/23 0824 08/27/23 0959         Subjective: Seen and examined.  Complains of 10 out of 10 pain in the right foot.  Appears in pain as well.  No other complaint.  Objective: Vitals:   08/21/23 1946 08/21/23 2314 08/22/23 0253 08/22/23 0800  BP: 102/75 100/60 110/73 125/62  Pulse: 63 62 80 62  Resp: 20 20 11 15   Temp: 98.9 F (37.2 C) 99.2 F (37.3 C) 98.8 F (37.1 C) 98.5 F (36.9 C)  TempSrc: Oral Oral Oral Oral  SpO2: 100% 100%  100%  Weight:      Height:        Intake/Output Summary (Last 24 hours) at 08/22/2023 1057 Last data filed at 08/22/2023 0828 Gross per 24 hour  Intake 940 ml  Output 675 ml  Net 265 ml   Filed Weights   08/19/23 1017 08/21/23 1026  Weight: 62.1 kg (P) 62.1 kg    Examination:  General exam: Appears in pain. Respiratory system: Clear to auscultation. Respiratory effort normal. Cardiovascular system: S1 & S2 heard, RRR. No JVD, murmurs,  rubs, gallops or clicks. No pedal edema. Gastrointestinal system: Abdomen is nondistended, soft and nontender. No organomegaly or masses felt. Normal bowel sounds heard. Central nervous system: Alert and oriented. No focal neurological deficits. Extremities: Right foot dressing. Psychiatry: Judgement and insight appear normal. Mood & affect appropriate.     Data Reviewed: I have personally reviewed following labs and imaging studies  CBC: Recent Labs  Lab 08/19/23 1107 08/19/23 1806 08/21/23 0311 08/22/23 0308  WBC  --  8.6 7.7 11.3*  NEUTROABS  --   --   --  7.8*  HGB 15.3* 12.7 9.9* 10.1*  HCT 45.0 43.2 31.9* 32.2*  MCV  --  93.9 89.9 90.7  PLT  --  439* 400 406*   Basic Metabolic Panel: Recent Labs  Lab 08/19/23 1107 08/19/23 1806 08/20/23 1407 08/21/23 0311 08/22/23 0308  NA 143  --  140 139 140  K 3.9  --  4.1 3.7 4.6  CL 110  --  105 106 105  CO2  --   --  22 24 27   GLUCOSE 96  --  119* 124* 111*  BUN 38*  --  25* 24* 24*  CREATININE 1.10* 0.85 0.97 0.83 0.82  CALCIUM   --   --  9.0 9.1 9.1  MG  --   --   --  1.8  --    GFR: Estimated Creatinine Clearance: 67.9 mL/min (by C-G formula based on SCr of 0.82 mg/dL). Liver Function Tests: Recent Labs  Lab 08/20/23 1407 08/21/23 0311  AST 13* 11*  ALT 8 8  ALKPHOS 66 67  BILITOT 0.5 0.4  PROT 6.9 6.9  ALBUMIN 3.2* 3.1*   No results for input(s): LIPASE, AMYLASE in the last 168 hours. No results for input(s): AMMONIA in the last 168 hours. Coagulation Profile: No results for input(s): INR, PROTIME in the last 168 hours. Cardiac Enzymes: No results for input(s): CKTOTAL, CKMB, CKMBINDEX, TROPONINI in the last 168 hours. BNP (last 3 results) No results for input(s): PROBNP in the last 8760 hours. HbA1C: No results for input(s): HGBA1C in the last 72 hours. CBG: No results for input(s): GLUCAP in the last 168 hours. Lipid Profile: Recent Labs    08/20/23 0347  CHOL 71  HDL  29*  LDLCALC 27  TRIG 74  CHOLHDL 2.4   Thyroid  Function Tests: No results for input(s): TSH, T4TOTAL, FREET4, T3FREE, THYROIDAB in the last 72 hours. Anemia Panel: No results for input(s): VITAMINB12, FOLATE, FERRITIN, TIBC, IRON, RETICCTPCT in the last 72 hours. Sepsis Labs: No results for input(s): PROCALCITON, LATICACIDVEN in the last 168 hours.  No results found for this or any previous visit (from the past 240 hours).   Radiology Studies: DG Foot 2 Views Right Result Date: 08/21/2023 CLINICAL DATA:  Postoperative EXAM: RIGHT FOOT - 2 VIEW COMPARISON:  Right foot x-ray 03/03/2023 FINDINGS: There is been interval amputation of the first through fifth proximal metatarsals. There are overlying skin staples. The bones are diffusely osteopenic. There is no acute fracture or dislocation. Soft tissues are otherwise within normal limits. IMPRESSION: Interval amputation of the first through fifth proximal metatarsals. Electronically Signed   By: Greig Pique M.D.   On: 08/21/2023 17:07    Scheduled Meds:  apixaban   5 mg Oral BID   atorvastatin   40 mg Oral Daily   carvedilol   3.125 mg Oral BID WC   clopidogrel   75 mg Oral Daily   dofetilide   125 mcg Oral BID   doxycycline   100 mg Oral Q12H   furosemide   40 mg Oral Daily   methimazole   5 mg Oral Daily   nicotine   7 mg Transdermal Daily   sodium chloride  flush  3 mL Intravenous Q12H   spironolactone   12.5 mg Oral Daily   Continuous Infusions:  magnesium  sulfate bolus IVPB       LOS: 3 days   Fredia Skeeter, MD [facility]  08/22/2023, 10:57 AM   *Please note that this is a verbal dictation therefore any spelling or grammatical errors are due to the Dragon Medical One system interpretation.  Please page via Amion and do not message via secure chat for urgent patient care matters. Secure chat can be used for non urgent patient care matters.  How to contact the [facility] Attending or Consulting provider  7A - 7P or covering provider during after hours 7P -7A, for this patient?  Check the care team in [facility] and look for a) attending/consulting [facility] provider listed and b) the [facility] team listed. Page or secure chat 7A-7P. Log into [email or web address] and use Vivian's universal password to  access. If you do not have the password, please contact the hospital operator. Locate the [facility] provider you are looking for under [facility] and page to a number that you can be directly reached. If you still have difficulty reaching the provider, please page the [facility] (Director on Call) for the Hospitalists listed on amion for assistance.

## 2023-08-23 ENCOUNTER — Other Ambulatory Visit (HOSPITAL_COMMUNITY): Payer: Self-pay

## 2023-08-23 ENCOUNTER — Encounter (HOSPITAL_COMMUNITY): Payer: Self-pay | Admitting: Podiatry

## 2023-08-23 DIAGNOSIS — I739 Peripheral vascular disease, unspecified: Secondary | ICD-10-CM | POA: Diagnosis not present

## 2023-08-23 LAB — CBC WITH DIFFERENTIAL/PLATELET
Abs Immature Granulocytes: 0.04 K/uL (ref 0.00–0.07)
Basophils Absolute: 0 K/uL (ref 0.0–0.1)
Basophils Relative: 0 %
Eosinophils Absolute: 0.1 K/uL (ref 0.0–0.5)
Eosinophils Relative: 1 %
HCT: 31.6 % — ABNORMAL LOW (ref 36.0–46.0)
Hemoglobin: 9.9 g/dL — ABNORMAL LOW (ref 12.0–15.0)
Immature Granulocytes: 0 %
Lymphocytes Relative: 26 %
Lymphs Abs: 2.5 K/uL (ref 0.7–4.0)
MCH: 28.3 pg (ref 26.0–34.0)
MCHC: 31.3 g/dL (ref 30.0–36.0)
MCV: 90.3 fL (ref 80.0–100.0)
Monocytes Absolute: 0.8 K/uL (ref 0.1–1.0)
Monocytes Relative: 8 %
Neutro Abs: 6 K/uL (ref 1.7–7.7)
Neutrophils Relative %: 65 %
Platelets: 390 K/uL (ref 150–400)
RBC: 3.5 MIL/uL — ABNORMAL LOW (ref 3.87–5.11)
RDW: 13.1 % (ref 11.5–15.5)
WBC: 9.5 K/uL (ref 4.0–10.5)
nRBC: 0 % (ref 0.0–0.2)

## 2023-08-23 LAB — BASIC METABOLIC PANEL WITH GFR
Anion gap: 10 (ref 5–15)
BUN: 20 mg/dL (ref 8–23)
CO2: 24 mmol/L (ref 22–32)
Calcium: 9.2 mg/dL (ref 8.9–10.3)
Chloride: 101 mmol/L (ref 98–111)
Creatinine, Ser: 0.71 mg/dL (ref 0.44–1.00)
GFR, Estimated: >60 mL/min (ref >60)
Glucose, Bld: 108 mg/dL — ABNORMAL HIGH (ref 70–99)
Potassium: 4.1 mmol/L (ref 3.5–5.1)
Sodium: 135 mmol/L (ref 135–145)

## 2023-08-23 LAB — MAGNESIUM: Magnesium: 2 mg/dL (ref 1.7–2.4)

## 2023-08-23 LAB — SURGICAL PATHOLOGY

## 2023-08-23 MED ORDER — DOXYCYCLINE HYCLATE 100 MG PO TABS
100.0000 mg | ORAL_TABLET | Freq: Two times a day (BID) | ORAL | 0 refills | Status: AC
Start: 2023-08-23 — End: 2023-08-27
  Filled 2023-08-23: qty 7, 4d supply, fill #0

## 2023-08-23 MED ORDER — OXYCODONE HCL ER 10 MG PO T12A
10.0000 mg | EXTENDED_RELEASE_TABLET | Freq: Two times a day (BID) | ORAL | 0 refills | Status: AC
  Filled 2023-08-23: qty 14, 7d supply, fill #0

## 2023-08-23 NOTE — TOC Transition Note (Signed)
 Transition of Care (TOC) - Discharge Note Rayfield Gobble RN, BSN Inpatient Care Management Unit 4E- RN Case Manager See Treatment Team for direct phone #   Patient Details  Name: Joanna [name] MRN: [id number] Date of Birth: 04-19-1960  Transition of Care [facility]) CM/SW Contact:  Gobble Rayfield Hurst, RN Phone Number: 08/23/2023, 11:57 AM   Clinical Narrative:    Pt stable for transition home today, Orders placed for HHPT/OT.   CM in to speak with pt for transition needs. HH list provided for choice- Per CMS guidelines from [employer] website with star ratings (copy placed in shadow chart)- pt voiced she does not have a preference- is agreeable to services .  Discussed DME- pt states she has a RW at home- received it on her last hospital stay- explained that insurance would not cover another at this time (once every 61yrs) pt voiced understanding- no other DME needs expressed.   Pt states her sister will come transport her home when she is ready.   Call made to [facility] liaison for [facility] referral- HHPT/OT referral has been accepted.   No further CM needs noted.    Final next level of care: Home w Home Health Services Barriers to Discharge: No Barriers Identified   Patient Goals and CMS Choice Patient states their goals for this hospitalization and ongoing recovery are:: return home CMS Medicare.gov Compare Post Acute Care list provided to:: Patient Choice offered to / list presented to : Patient      Discharge Placement                 Home w/ [facility]      Discharge Plan and Services Additional resources added to the After Visit Summary for     Discharge Planning Services: CM Consult Post Acute Care Choice: Durable Medical Equipment, Home Health          DME Arranged: N/A DME Agency: NA       HH Arranged: PT, OT HH Agency: [facility] Date [facility] Agency Contacted: 08/23/23 Time HH Agency Contacted: 1130 Representative spoke with at [facility] Agency:  Amy  Social Drivers of Health (SDOH) Interventions SDOH Screenings   Food Insecurity: Patient Declined (08/19/2023)  Housing: Unknown (08/19/2023)  Transportation Needs: Patient Declined (08/19/2023)  Utilities: Patient Declined (08/19/2023)  Alcohol Screen: Low Risk  (07/17/2022)  Depression (PHQ2-9): Low Risk  (07/17/2022)  Financial Resource Strain: Low Risk  (07/17/2022)  Physical Activity: Insufficiently Active (07/17/2022)  Social Connections: Moderately Isolated (03/04/2023)  Stress: No Stress Concern Present (07/17/2022)  Tobacco Use: High Risk (08/21/2023)     Readmission Risk Interventions    08/23/2023   11:57 AM  Readmission Risk Prevention Plan  Post Dischage Appt Complete  Medication Screening Complete  Transportation Screening Complete

## 2023-08-23 NOTE — Discharge Summary (Signed)
 Physician Discharge Summary  Joanna [name] FMW:[id number] DOB: [birth year] DOA: 08/19/2023  PCP: Vicci Barnie NOVAK, MD  Admit date: 08/19/2023 Discharge date: 08/23/2023 30 Day Unplanned Readmission Risk Score    Flowsheet Row Admission (Current) from 08/19/2023 in [facility] 4E CV SURGICAL PROGRESSIVE CARE  30 Day Unplanned Readmission Risk Score (%) 12.54 Filed at 08/23/2023 0801    This score is the patient's risk of an unplanned readmission within 30 days of being discharged (0 -100%). The score is based on dignosis, age, lab data, medications, orders, and past utilization.   Low:  0-14.9   Medium: 15-21.9   High: 22-29.9   Extreme: 30 and above          Admitted From: Home Disposition: Home  Recommendations for Outpatient Follow-up:  Follow up with PCP in 1-2 weeks Please obtain BMP/CBC in one week Follow-up with podiatry per their scheduled time and date Please follow up with your PCP on the following pending results: Unresulted Labs (From admission, onward)    None         Home Health: Yes Equipment/Devices: None  Discharge Condition: Stable CODE STATUS: Full code Diet recommendation: Cardiac  Subjective: Seen and examined, still complains of right foot pain but improving.  No other complaint.  She is willing to go home today.  Brief/Interim Summary: Joanna [name] is a 63 y.o. female with medical history significant of atrial fibrillation, chronic HFrEF, hypertension, tobacco abuse, peripheral artery disease presenting with limb ischemia and right foot gangrene.  Patient noted to have recurring issues associate with PAD.  Was recently evaluated by vascular surgery in February of this year secondary to blue toe syndrome.  She underwent angiogram with angioplasty and stent placement January of this year prior to evaluation.  Was noted to have been seen by vascular surgery with loss of decreased flow on ABI.  Was also seen by outpatient podiatry with recommendation of  transmetatarsal amputation to deal with issues per report.  Patient still smoking roughly 1/4 pack/day.  Denies any alcohol or illicit drug use.  Was admitted for July 14 for vascular evaluation including stenting of right SFA and posterior tibial artery.  Given persistence of symptoms, recommendations for transmetatarsal amputation made.  Vascular surgery reached out to hospitalist service to take over care with Dr. Lore with vascular surgery reaching out to Dr. Malvin with podiatry to further evaluate.  Details of the hospitalization as below.   Gangrene of foot (HCC) / Critical Limb Ischemia / PAD, POA: Noted R foot dry gangrene and critical limb ischemia s/p Stent of right posterior tibial artery, TP trunk, Stent of right SFA with Dr. Sheree 08/19/23  Previously seen by Dr. Janit 07/20/2023 with plan for TMA.  Admitted to hospitalist service, seen by Dr. Malvin, underwent transmetatarsal amputation of the right foot 08/21/2023.  Due to excruciating pain, she was kept in the hospital yesterday, started on long-acting oxycodone  scheduled and as needed short-acting oxycodone , pain is improved somewhat, although still moderate but patient is agreeable with going home.  She was also started on doxycycline  twice daily for 5 days per podiatry recommendation, she has taken 3 doses so far, will be discharged on 7 more doses.  I will also discharge her on 1 week supply of long-acting oxycodone .  She has been advised to follow-up with PCP as well as podiatry within 1 week and if she requires, she can request further refills of the pain medications by them.   COPD (chronic obstructive pulmonary disease) (HCC) Stable, no  wheezes.  Continue bronchodilators.   HFrEF (heart failure with reduced ejection fraction) (HCC) 2D ECHO 07/2023 w/ EF 40-45%, grade 1 diastolic dysfunction, mild elevated PA pressure, mild MR.  Euvolemic. Cont home regimen including coreg , spironolactone , lasix   Monitor volume status  closely   Persistent atrial fibrillation (HCC) Cont home regimen including coreg , tikosyn , rates controlled. Eliquis  resumed on the evening of 08/21/2023 after clearance from podiatry.   Smoker/nicotine  dependence: I have discussed tobacco cessation with the patient.  I have counseled the patient regarding the negative impacts of continued tobacco use including but not limited to lung cancer, COPD, and cardiovascular disease.  I have discussed alternatives to tobacco and modalities that may help facilitate tobacco cessation including but not limited to biofeedback, hypnosis, and medications.  Total time spent with tobacco counseling was 5 minutes.  Discharge plan was discussed with patient and/or family member and they verbalized understanding and agreed with it.  Discharge Diagnoses:  Principal Problem:   PAD (peripheral artery disease) (HCC) Active Problems:   Gangrene of foot (HCC)   COPD (chronic obstructive pulmonary disease) (HCC)   Former smoker   Persistent atrial fibrillation (HCC)   HFrEF (heart failure with reduced ejection fraction) (HCC)   Gangrene of right foot [facility])    Discharge Instructions  Discharge Instructions     Call MD for:  redness, tenderness, or signs of infection (pain, swelling, bleeding, redness, odor or green/yellow discharge around incision site)   Complete by: As directed    Call MD for:  severe or increased pain, loss or decreased feeling  in affected limb(s)   Complete by: As directed    Call MD for:  temperature >100.5   Complete by: As directed    Driving Restrictions   Complete by: As directed    No driving for 4 weeks or while taking narcotic pain medication   Increase activity slowly   Complete by: As directed    Walk with assistance use walker or cane as needed   Lifting restrictions   Complete by: As directed    No lifting for 2 weeks   Resume previous diet   Complete by: As directed    may wash over wound with mild soap and water    Complete by: As directed       Allergies as of 08/23/2023   No Known Allergies      Medication List     TAKE these medications    atorvastatin  40 MG tablet Commonly known as: LIPITOR Take 1 tablet (40 mg total) by mouth daily.   carvedilol  6.25 MG tablet Commonly known as: COREG  TAKE 1 TABLET(6.25 MG) BY MOUTH TWICE DAILY What changed: See the new instructions.   clopidogrel  75 MG tablet Commonly known as: PLAVIX  Take 1 tablet (75 mg total) by mouth daily.   dofetilide  125 MCG capsule Commonly known as: TIKOSYN  Take 1 capsule (125 mcg total) by mouth 2 (two) times daily.   doxycycline  100 MG tablet Commonly known as: VIBRA -TABS Take 1 tablet (100 mg total) by mouth every 12 (twelve) hours for 7 doses.   Eliquis  5 MG Tabs tablet Generic drug: apixaban  TAKE 1 TABLET BY MOUTH TWICE  DAILY   Farxiga  10 MG Tabs tablet Generic drug: dapagliflozin  propanediol TAKE 1 TABLET BY MOUTH DAILY   feeding supplement Liqd Take 237 mLs by mouth 2 (two) times daily. If she has any   furosemide  40 MG tablet Commonly known as: LASIX  Take 40 mg by mouth daily.  methimazole  5 MG tablet Commonly known as: TAPAZOLE  Take 1 tablet (5 mg total) by mouth daily.   oxyCODONE  10 mg 12 hr tablet Commonly known as: OXYCONTIN  Take 1 tablet (10 mg total) by mouth every 12 (twelve) hours for 7 days.   oxyCODONE -acetaminophen  5-325 MG tablet Commonly known as: PERCOCET/ROXICET Take 1 tablet by mouth every 6 (six) hours as needed for severe pain (pain score 7-10).   potassium chloride  10 MEQ tablet Commonly known as: KLOR-CON  TAKE 1 TABLET(10 MEQ) BY MOUTH DAILY   spironolactone  25 MG tablet Commonly known as: ALDACTONE  Take 0.5 tablets (12.5 mg total) by mouth daily.        Follow-up Information     [facility] Follow up in 1 month(s).   Specialty: Vascular Surgery Why: The office will call you with your  appointment Contact information: [address] [place]  [address] [phone]        Vicci Barnie NOVAK, MD Follow up in 1 week(s).   Specialty: Internal Medicine Contact information: [address] [place] [address] [place] [address] [phone]                No Known Allergies  Consultations: Vascular surgery and podiatry   Procedures/Studies: DG Foot 2 Views Right Result Date: 08/21/2023 CLINICAL DATA:  Postoperative EXAM: RIGHT FOOT - 2 VIEW COMPARISON:  Right foot x-ray 03/03/2023 FINDINGS: There is been interval amputation of the first through fifth proximal metatarsals. There are overlying skin staples. The bones are diffusely osteopenic. There is no acute fracture or dislocation. Soft tissues are otherwise within normal limits. IMPRESSION: Interval amputation of the first through fifth proximal metatarsals. Electronically Signed   By: Greig Pique M.D.   On: 08/21/2023 17:07   PERIPHERAL VASCULAR CATHETERIZATION Result Date: 08/19/2023 Images from the original result were not included. Patient name: Joanna [name] MRN: [id number] DOB: 11/02/1960 Sex: female 08/19/2023 Pre-operative Diagnosis: Atherosclerosis native arteries right lower extremity with gangrene of multiple toes Post-operative diagnosis:  Same Surgeon:  Penne BROCKS. Sheree, MD Procedure Performed: 1.  Percutaneous ultrasound-guided cannulation left common femoral artery 2.  Catheter in aorta and aortogram with bilateral lower extremity angiography 3.  Catheter selection of right common femoral artery, right popliteal artery and right posterior tibial arteries 4.  Stent of right posterior tibial artery and TP trunk with 2.5 x 28mm Esprit btk 5.  Stent of right SFA with 6 x 40 mm Eluvia 6.  Moderate sedation with fentanyl  and Versed  for 63 minutes Indications: 63 year old female with significant gangrene of multiple toes on the right with recent drop in her ABI after previous stenting of  her right external iliac artery as well as balloon angioplasty of the tibioperoneal trunk and posterior tibial arteries.  She now has evidence of recurrent disease in the SFA as well as the tibioperoneal trunk and proximal posterior tibial artery by duplex she is indicated for angiography with possible invention. Findings: The aorta was somewhat difficult to visualize the branches but the celiac and SMA were patent.  Distally there was significant calcification of the bilateral common and external iliac arteries however both hypogastric arteries were patent and there was no flow-limiting stenosis.  There was a right external iliac artery stent which is patent.  Both common femoral arteries and their branches including the SFA and profunda are patent.  The right lower extremity which is the site of interest the SFA  has approximately 80% stenosis at the adductor and this was stented to 0% residual stenosis.  Below the knee the anterior tibial artery is very large and runs off to the distal ankle but does not give rise to any large dorsalis pedis.  The peroneal artery initially is patent and then very diminutive towards the ankle and is not visualized.  The posterior tibial artery initially is a 90% stenosis but is the dominant flow into the foot.  This was stented to 0% residual stenosis. Patient is optimized from a vascular surgery standpoint for amputation of her toes versus partial foot.  Procedure:  The patient was identified in the holding area and taken to room 8.  The patient was then placed supine on the table and prepped and draped in the usual sterile fashion.  A time out was called.  Ultrasound was used to evaluate the left common femoral artery which was noted be patent but there was significant posterior plaque.  The area was anesthetized 1% lidocaine  and cannulated with a micropuncture needle followed by wire and sheath using direct ultrasound visualization and ultrasound images saved to the permanent  record.  Concomitantly we administered fentanyl  and Versed  as moderate sedation her vital signs were monitored throughout the case.  I placed the Bentson wire under fluoroscopic guidance followed by 5 French sheath and Omni catheter was placed to the level of L1 and aortogram was performed followed by pelvic angiography.  With the above findings we crossed the bifurcation with Bentson wire and Omni catheter.  We then performed right lower extremity angiography from the right common femoral artery and with the above findings were then placed a long 6 French sheath the patient was fully heparinized.  Using a V18 wire we crossed the SFA on the right and then below the knee we used a Rubicon catheter and combination of 014 and 018 wires and were ultimately able to cross the posterior tibial artery and confirmed intraluminal access with a catheter.  We then exchanged for an 014 wire and then balloon dilated the proximal posterior tibial artery and proximal tibioperoneal trunk with a 2.5 mm coyote and then primarily stented with 2.5 mm Esprit postdilated with a 2.5 mm Jade and completion demonstrated distal spasm but ultimately after removing the wire there was patency.  We turned our attention towards the SFA angiography demonstrated the stenosis in the SFA we primarily stented this and postdilated with 5 mm balloon to 0% residual stenosis on completion.  Satisfied with this we exchanged for a short 6 French sheath in the groin.  The patient tolerated the procedure without any complication. Contrast: 85cc Brandon C. Sheree, MD [facility] Office: [phone] Pager: [phone]   VAS US  ABI WITH/WO TBI Result Date: 08/08/2023  LOWER EXTREMITY DOPPLER STUDY Patient Name:  Atalie Oros  Date of Exam:   08/08/2023 Medical Rec #: [id number]        Accession #:    [id number] Date of Birth: [birth year]        Patient Gender: F Patient Age:   1 years Exam Location:  [address] Procedure:       VAS US  ABI WITH/WO TBI Referring Phys: --------------------------------------------------------------------------------  Indications: Gangrene, and peripheral artery disease. Patient has gangrenous              toes 1-3 and awaiting amputation on 08/23/2023. She has extreme              right foot pain. High Risk  Factors: Hypertension, current smoker. Other Factors: SEE RIGHT LEG ARTERIAL DUPLEX REPORT.  Vascular Interventions: 03/07/2023 REIA angioplasty with stent                          03/27/2023 Right TPT/PTA angioplasty. Comparison Study: Prior ABI 04/17/2023 right 1.23 left 1.15 Performing Technologist: Horton Fairy RVT  Examination Guidelines: A complete evaluation includes at minimum, Doppler waveform signals and systolic blood pressure reading at the level of bilateral brachial, anterior tibial, and posterior tibial arteries, when vessel segments are accessible. Bilateral testing is considered an integral part of a complete examination. Photoelectric Plethysmograph (PPG) waveforms and toe systolic pressure readings are included as required and additional duplex testing as needed. Limited examinations for reoccurring indications may be performed as noted.  ABI Findings: +---------+------------------+-----+----------+----------+ Right    Rt Pressure (mmHg)IndexWaveform  Comment    +---------+------------------+-----+----------+----------+ Brachial 127                                         +---------+------------------+-----+----------+----------+ PTA      112               0.88 monophasic           +---------+------------------+-----+----------+----------+ PERO     119               0.94 monophasic           +---------+------------------+-----+----------+----------+ DP       120               0.94 monophasic           +---------+------------------+-----+----------+----------+ Great Toe                                 gangrenous  +---------+------------------+-----+----------+----------+ +---------+------------------+-----+--------+-------+ Left     Lt Pressure (mmHg)IndexWaveformComment +---------+------------------+-----+--------+-------+ Brachial 123                                    +---------+------------------+-----+--------+-------+ PTA      159               1.25 biphasic        +---------+------------------+-----+--------+-------+ DP       131               1.03 biphasic        +---------+------------------+-----+--------+-------+ Great Toe77                0.61 Abnormal        +---------+------------------+-----+--------+-------+ +-------+-----------+-----------+------------+------------+ ABI/TBIToday's ABIToday's TBIPrevious ABIPrevious TBI +-------+-----------+-----------+------------+------------+ Right  .94                   1.23                     +-------+-----------+-----------+------------+------------+ Left   1.25       .61        .15         .52          +-------+-----------+-----------+------------+------------+  Right ABIs appear decreased compared to prior study on 04/17/2023. Left ABIs appear essentially unchanged compared to prior study on 04/17/2023.  Summary: Right: Resting right ankle-brachial index is within normal range. The right toe-brachial index is  abnormal. Left: Resting left ankle-brachial index is within normal range. The left toe-brachial index is abnormal. *See table(s) above for measurements and observations.  Electronically signed by Fonda Rim on 08/08/2023 at 4:04:48 PM.    Final    VAS US  LOWER EXTREMITY ARTERIAL DUPLEX Result Date: 08/08/2023 LOWER EXTREMITY ARTERIAL DUPLEX STUDY Patient Name:  Joanna [name]  Date of Exam:   08/08/2023 Medical Rec #: [id number]        Accession #:    [id number] Date of Birth: [birth year]        Patient Gender: F Patient Age:   55 years Exam Location:  [address] Procedure:      VAS US  LOWER EXTREMITY ARTERIAL  DUPLEX Referring Phys: FONDA ROBINS --------------------------------------------------------------------------------  Indications: Gangrene, peripheral artery disease, and Patient has gangrenous              toes 1-3 and awaiting amputation on 08/23/2023. She has extreme              right foot pain. High Risk Factors: Hypertension, current smoker. Other Factors: SEE ABI REPORT.  Vascular Interventions: 03/07/2023 REIA angioplasty with stent                          03/27/2023 Right TPT/PTA angioplasty. Current ABI:            Right .94 Left 1.25 Comparison Study: Prior right leg arterial duplex exam on 03/06/2023 showed                   highest velocity right SFA prox 62 cm/s. Performing Technologist: Annabella Cater RVT, RDCS (AE), RDMS  Examination Guidelines: A complete evaluation includes B-mode imaging, spectral Doppler, color Doppler, and power Doppler as needed of all accessible portions of each vessel. Bilateral testing is considered an integral part of a complete examination. Limited examinations for reoccurring indications may be performed as noted.  +-----------+--------+-----+---------------+----------+------------------------+ RIGHT      PSV cm/sRatioStenosis       Waveform  Comments                 +-----------+--------+-----+---------------+----------+------------------------+ EIA Distal 111                         monophasicdistal stent struts seen +-----------+--------+-----+---------------+----------+------------------------+ CFA Prox   128                         monophasic                         +-----------+--------+-----+---------------+----------+------------------------+ CFA Distal 112                         monophasic                         +-----------+--------+-----+---------------+----------+------------------------+ DFA        138                         monophasic                          +-----------+--------+-----+---------------+----------+------------------------+ SFA Prox   91                          monophasic                         +-----------+--------+-----+---------------+----------+------------------------+  SFA Mid    402          50-74% stenosismonophasic                         +-----------+--------+-----+---------------+----------+------------------------+ SFA Distal 90                          monophasic                         +-----------+--------+-----+---------------+----------+------------------------+ POP Prox   68                          monophasic                         +-----------+--------+-----+---------------+----------+------------------------+ POP Distal 60                          monophasic                         +-----------+--------+-----+---------------+----------+------------------------+ TP Trunk   87                          monophasic                         +-----------+--------+-----+---------------+----------+------------------------+ ATA Distal 49                          monophasic                         +-----------+--------+-----+---------------+----------+------------------------+ PTA Prox   598          75-99% stenosismonophasic                         +-----------+--------+-----+---------------+----------+------------------------+ PTA Mid    36                          monophasic                         +-----------+--------+-----+---------------+----------+------------------------+ PTA Distal 28                          monophasic                         +-----------+--------+-----+---------------+----------+------------------------+ PERO Distal                                      not seen                 +-----------+--------+-----+---------------+----------+------------------------+ A focal velocity elevation of 402 cm/s was obtained at MID SFA with a VR of 3.7.  Findings are characteristic of 50-74% stenosis. A 2nd focal velocity elevation was visualized, measuring 598 cm/s at PTA PRX with a VR of 6.9. Findings are characteristic of  75-99% stenosis.  Summary: Right: 50-74% stenosis noted in the superficial femoral artery. 75-99% stenosis noted in the posterior tibial artery.  See table(s) above for measurements  and observations. Electronically signed by Fonda Rim on 08/08/2023 at 4:03:33 PM.    Final      Discharge Exam: Vitals:   08/23/23 0737 08/23/23 0954  BP: 102/62 117/65  Pulse: 66   Resp: 20 18  Temp: 99.4 F (37.4 C)   SpO2: 98%    Vitals:   08/22/23 2304 08/23/23 0305 08/23/23 0737 08/23/23 0954  BP: 125/68 (!) 90/48 102/62 117/65  Pulse: 76 78 66   Resp: 20 20 20 18   Temp: 98.3 F (36.8 C) 99.6 F (37.6 C) 99.4 F (37.4 C)   TempSrc: Oral Oral Oral   SpO2: 96% 99% 98%   Weight:      Height:        General: Pt is alert, awake, not in acute distress Cardiovascular: RRR, S1/S2 +, no rubs, no gallops Respiratory: CTA bilaterally, no wheezing, no rhonchi Abdominal: Soft, NT, ND, bowel sounds + Extremities: no edema, no cyanosis, dressing in the right foot.    The results of significant diagnostics from this hospitalization (including imaging, microbiology, ancillary and laboratory) are listed below for reference.     Microbiology: No results found for this or any previous visit (from the past 240 hours).   Labs: BNP (last 3 results) Recent Labs    01/15/23 1510 03/03/23 2002  BNP 49.7 69.2   Basic Metabolic Panel: Recent Labs  Lab 08/19/23 1107 08/19/23 1806 08/20/23 1407 08/21/23 0311 08/22/23 0308 08/23/23 0237  NA 143  --  140 139 140 135  K 3.9  --  4.1 3.7 4.6 4.1  CL 110  --  105 106 105 101  CO2  --   --  22 24 27 24   GLUCOSE 96  --  119* 124* 111* 108*  BUN 38*  --  25* 24* 24* 20  CREATININE 1.10* 0.85 0.97 0.83 0.82 0.71  CALCIUM   --   --  9.0 9.1 9.1 9.2  MG  --   --   --  1.8  --  2.0    Liver Function Tests: Recent Labs  Lab 08/20/23 1407 08/21/23 0311  AST 13* 11*  ALT 8 8  ALKPHOS 66 67  BILITOT 0.5 0.4  PROT 6.9 6.9  ALBUMIN 3.2* 3.1*   No results for input(s): LIPASE, AMYLASE in the last 168 hours. No results for input(s): AMMONIA in the last 168 hours. CBC: Recent Labs  Lab 08/19/23 1107 08/19/23 1806 08/21/23 0311 08/22/23 0308 08/23/23 0237  WBC  --  8.6 7.7 11.3* 9.5  NEUTROABS  --   --   --  7.8* 6.0  HGB 15.3* 12.7 9.9* 10.1* 9.9*  HCT 45.0 43.2 31.9* 32.2* 31.6*  MCV  --  93.9 89.9 90.7 90.3  PLT  --  439* 400 406* 390   Cardiac Enzymes: No results for input(s): CKTOTAL, CKMB, CKMBINDEX, TROPONINI in the last 168 hours. BNP: Invalid input(s): POCBNP CBG: No results for input(s): GLUCAP in the last 168 hours. D-Dimer No results for input(s): DDIMER in the last 72 hours. Hgb A1c No results for input(s): HGBA1C in the last 72 hours. Lipid Profile No results for input(s): CHOL, HDL, LDLCALC, TRIG, CHOLHDL, LDLDIRECT in the last 72 hours. Thyroid  function studies No results for input(s): TSH, T4TOTAL, T3FREE, THYROIDAB in the last 72 hours.  Invalid input(s): FREET3 Anemia work up No results for input(s): VITAMINB12, FOLATE, FERRITIN, TIBC, IRON, RETICCTPCT in the last 72 hours. Urinalysis    Component Value Date/Time   COLORURINE YELLOW 03/04/2023  1223   APPEARANCEUR HAZY (A) 03/04/2023 1223   LABSPEC 1.010 03/04/2023 1223   PHURINE 5.0 03/04/2023 1223   GLUCOSEU NEGATIVE 03/04/2023 1223   HGBUR MODERATE (A) 03/04/2023 1223   BILIRUBINUR NEGATIVE 03/04/2023 1223   KETONESUR NEGATIVE 03/04/2023 1223   PROTEINUR NEGATIVE 03/04/2023 1223   NITRITE NEGATIVE 03/04/2023 1223   LEUKOCYTESUR LARGE (A) 03/04/2023 1223   Sepsis Labs Recent Labs  Lab 08/19/23 1806 08/21/23 0311 08/22/23 0308 08/23/23 0237  WBC 8.6 7.7 11.3* 9.5   Microbiology No results found for this or  any previous visit (from the past 240 hours).  FURTHER DISCHARGE INSTRUCTIONS:   Get Medicines reviewed and adjusted: Please take all your medications with you for your next visit with your Primary MD   Laboratory/radiological data: Please request your Primary MD to go over all hospital tests and procedure/radiological results at the follow up, please ask your Primary MD to get all Hospital records sent to his/her office.   In some cases, they will be blood work, cultures and biopsy results pending at the time of your discharge. Please request that your primary care M.D. goes through all the records of your hospital data and follows up on these results.   Also Note the following: If you experience worsening of your admission symptoms, develop shortness of breath, life threatening emergency, suicidal or homicidal thoughts you must seek medical attention immediately by calling 911 or calling your MD immediately  if symptoms less severe.   You must read complete instructions/literature along with all the possible adverse reactions/side effects for all the Medicines you take and that have been prescribed to you. Take any new Medicines after you have completely understood and accpet all the possible adverse reactions/side effects.    patient was instructed, not to drive, operate heavy machinery, perform activities at heights, swimming or participation in water activities or provide baby-sitting services while on Pain, Sleep and Anxiety Medications; until their outpatient Physician has advised to do so again. Also recommended to not to take more than prescribed Pain, Sleep and Anxiety Medications.  It is not advisable to combine anxiety, sleep and pain medications without talking with your primary care provider.     Wear Seat belts while driving.   Please note: You were cared for by a hospitalist during your hospital stay. Once you are discharged, your primary care physician will handle any further  medical issues. Please note that NO REFILLS for any discharge medications will be authorized once you are discharged, as it is imperative that you return to your primary care physician (or establish a relationship with a primary care physician if you do not have one) for your post hospital discharge needs so that they can reassess your need for medications and monitor your lab values  Time coordinating discharge: Over 30 minutes  SIGNED:   Fredia Skeeter, MD  [facility] 08/23/2023, 10:35 AM *Please note that this is a verbal dictation therefore any spelling or grammatical errors are due to the Dragon Medical One system interpretation. If 7PM-7AM, please contact night-coverage [email or web address]

## 2023-08-23 NOTE — Progress Notes (Signed)
 Physical Therapy Treatment Patient Details Name: Joanna [name] MRN: [id number] DOB: 08-02-1960 Today's Date: 08/23/2023   History of Present Illness Pt is a 63 y.o. female admitted 7/14 for scheduled abdominal aortogram and BLE angiography. S/p transmetatarsal amputation of R foot 7/16.  PMH: PAD, a-fib, CHF, COPD.    PT Comments  Pt received in supine, agreeable to therapy session and with good participation in seated balance task and transfer training. Pt defers need for additional gait or stair training once up in chair and requesting to eat lunch prior to DC. RN notified pt still has x2 IV in (pt dressed while seated EOB during session) and pt does not have chair alarm; pt agreeable to use call bell if needing to get up. Pt continues to benefit from PT services to progress toward functional mobility goals.     If plan is discharge home, recommend the following: A little help with walking and/or transfers;A little help with bathing/dressing/bathroom;Assistance with cooking/housework;Assist for transportation;Help with stairs or ramp for entrance   Can travel by private vehicle        Equipment Recommendations  Rolling walker (2 wheels)    Recommendations for Other Services       Precautions / Restrictions Precautions Precautions: Fall Recall of Precautions/Restrictions: Intact Required Braces or Orthoses: Other Brace Other Brace: R post-op shoe Restrictions Weight Bearing Restrictions Per Provider Order: Yes RLE Weight Bearing Per Provider Order: Non weight bearing     Mobility  Bed Mobility Overal bed mobility: Modified Independent             General bed mobility comments: no assist needed    Transfers Overall transfer level: Needs assistance Equipment used: Rolling walker (2 wheels) Transfers: Sit to/from Stand, Bed to chair/wheelchair/BSC Sit to Stand: Contact guard assist   Step pivot transfers: Supervision       General transfer comment: CGA to prevent  accidental WB through RLE with sit>stand, use of RW; Supervision to hop pivot to chair from EOB; pt defers gait due to food arriving and wanting to eat before DC    Ambulation/Gait                   Stairs             Wheelchair Mobility     Tilt Bed    Modified Rankin (Stroke Patients Only)       Balance Overall balance assessment: Needs assistance Sitting-balance support: No upper extremity supported, Feet supported Sitting balance-Leahy Scale: Good     Standing balance support: During functional activity, Reliant on assistive device for balance, Single extremity supported Standing balance-Leahy Scale: Poor Standing balance comment: Needs single UE for balance while NWB on RLE                            Communication Communication Communication: Impaired Factors Affecting Communication: Hearing impaired  Cognition Arousal: Alert Behavior During Therapy: WFL for tasks assessed/performed   PT - Cognitive impairments: No apparent impairments                       PT - Cognition Comments: HoH Following commands: Intact (HoH)      Cueing Cueing Techniques: Verbal cues  Exercises      General Comments        Pertinent Vitals/Pain Pain Assessment Pain Assessment: Faces Faces Pain Scale: Hurts a little bit Pain Location: RLE Pain Descriptors / Indicators: Discomfort  Pain Intervention(s): Monitored during session, Repositioned, Limited activity within patient's tolerance    Home Living                          Prior Function            PT Goals (current goals can now be found in the care plan section) Acute Rehab PT Goals Patient Stated Goal: Get well, go home, reduce pain PT Goal Formulation: With patient Time For Goal Achievement: 09/05/23 Progress towards PT goals: Progressing toward goals    Frequency    Min 2X/week      PT Plan      Co-evaluation              AM-PAC PT 6 Clicks  Mobility   Outcome Measure  Help needed turning from your back to your side while in a flat bed without using bedrails?: None Help needed moving from lying on your back to sitting on the side of a flat bed without using bedrails?: None Help needed moving to and from a bed to a chair (including a wheelchair)?: A Little Help needed standing up from a chair using your arms (e.g., wheelchair or bedside chair)?: A Little Help needed to walk in hospital room?: A Little Help needed climbing 3-5 steps with a railing? : A Lot (if only 1 rail) 6 Click Score: 19    End of Session Equipment Utilized During Treatment: Gait belt Activity Tolerance: Patient tolerated treatment well Patient left: with call bell/phone within reach;in chair (RLE elevated, pt set up to eat lunch; RN notified that her chair does not have an alarm in place) Nurse Communication: Mobility status PT Visit Diagnosis: Unsteadiness on feet (R26.81);Other abnormalities of gait and mobility (R26.89);Muscle weakness (generalized) (M62.81);Pain Pain - Right/Left: Right Pain - part of body: Ankle and joints of foot     Time: 8851-8794 PT Time Calculation (min) (ACUTE ONLY): 17 min  Charges:    $Therapeutic Activity: 8-22 mins PT General Charges $$ ACUTE PT VISIT: 1 Visit                     Joanna [name] P., PTA Acute Rehabilitation Services Secure Chat Preferred 9a-5:30pm Office: [phone]    Connell HERO [facility] 08/23/2023, 2:11 PM

## 2023-08-26 ENCOUNTER — Telehealth: Payer: Self-pay | Admitting: *Deleted

## 2023-08-26 ENCOUNTER — Telehealth: Payer: Self-pay

## 2023-08-26 NOTE — Transitions of Care (Post Inpatient/ED Visit) (Signed)
 08/26/2023  Name: Joanna [name] MRN: [id number] DOB: [birth year]  Today's TOC FU Call Status: Today's TOC FU Call Status:: Successful TOC FU Call Completed TOC FU Call Complete Date: 08/26/23 Patient's Name and Date of Birth confirmed.  Transition Care Management Follow-up Telephone Call Date of Discharge: 08/23/23 Discharge Facility: [facility]) Type of Discharge: Inpatient Admission Primary Inpatient Discharge Diagnosis:: PAD (peripheral artery disease), S/p transmetatarsal amputation of R foot 7/16 How have you been since you were released from the hospital?: Better Any questions or concerns?: No  Items Reviewed: Did you receive and understand the discharge instructions provided?: Yes Medications obtained,verified, and reconciled?: Yes (Medications Reviewed) Any new allergies since your discharge?: No Dietary orders reviewed?: Yes Type of Diet Ordered:: heart healthy Do you have support at home?: Yes People in Home [RPT]: sibling(s) Name of Support/Comfort Primary Source: Brother/Anthony  Medications Reviewed Today: Medications Reviewed Today     Reviewed by Lucky Andrea LABOR, RN (Registered Nurse) on 08/26/23 at [phone]  Med List Status: <None>   Medication Order Taking? Sig Documenting Provider Last Dose Status Informant  atorvastatin  (LIPITOR) 40 MG tablet [id number] Yes Take 1 tablet (40 mg total) by mouth daily. Vicci Barnie NOVAK, MD  Active Self  carvedilol  (COREG ) 6.25 MG tablet [id number] Yes TAKE 1 TABLET(6.25 MG) BY MOUTH TWICE DAILY Sabharwal, Aditya, DO  Active Self           Med Note CHRISTIE, JEANNETTA   Thu Aug 15, 2023  2:27 PM)    clopidogrel  (PLAVIX ) 75 MG tablet [id number] Yes Take 1 tablet (75 mg total) by mouth daily. Magda Debby SAILOR, MD  Active Self  dapagliflozin  propanediol (FARXIGA ) 10 MG TABS tablet [id number]  TAKE 1 TABLET BY MOUTH DAILY  Patient not taking: Reported on 08/26/2023   Sabharwal, Aditya, DO  Active Self  dofetilide  (TIKOSYN ) 125 MCG  capsule [id number] Yes Take 1 capsule (125 mcg total) by mouth 2 (two) times daily. Terra Fairy PARAS, PA-C  Active Self  doxycycline  (VIBRA -TABS) 100 MG tablet [id number] Yes Take 1 tablet (100 mg total) by mouth every 12 (twelve) hours for 7 doses. Vernon Ranks, MD  Active   ELIQUIS  5 MG TABS tablet [id number] Yes TAKE 1 TABLET BY MOUTH TWICE  DAILY Nahser, Aleene PARAS, MD  Active Self           Med Note CHRISTIE ALYSON Schaumann Aug 15, 2023  2:29 PM) Patient confirmed taking  feeding supplement [facility] IMMUNE [facility]ALTH) LIQD [id number] Yes Take 237 mLs by mouth 2 (two) times daily. If she has any [provider]  Active Self  furosemide  (LASIX ) 40 MG tablet [id number] Yes Take 40 mg by mouth daily. [provider]  Active Self           Med Note [name] Aug 12, 2023  3:03 PM) Patient confirmed taking LF 11/24  methimazole  (TAPAZOLE ) 5 MG tablet [id number] Yes Take 1 tablet (5 mg total) by mouth daily. Terra Fairy PARAS, PA-C  Active Self  oxyCODONE  (OXYCONTIN ) 10 mg 12 hr tablet [id number] Yes Take 1 tablet (10 mg total) by mouth every 12 (twelve) hours for 7 days. Vernon Ranks, MD  Active   oxyCODONE -acetaminophen  (PERCOCET/ROXICET) 5-325 MG tablet [id number] Yes Take 1 tablet by mouth every 6 (six) hours as needed for severe pain (pain score 7-10). Janit Thresa HERO, DPM  Active Self  potassium chloride  (KLOR-CON ) 10 MEQ tablet [id number] Yes TAKE 1 TABLET(10 MEQ) BY MOUTH DAILY Terra Fairy  J, PA-C  Active Self  spironolactone  (ALDACTONE ) 25 MG tablet [id number] Yes Take 0.5 tablets (12.5 mg total) by mouth daily. Vicci Barnie NOVAK, MD  Active Self            Home Care and Equipment/Supplies: Were Home Health Services Ordered?: Yes Name of Home Health Agency:: [phone] for [facility] PT[facility] Has Agency set up a time to come to your home?: Yes First Home Health Visit Date: 08/26/23 Any new equipment or medical supplies ordered?: No  Functional Questionnaire: Do you need  assistance with bathing/showering or dressing?: No Do you need assistance with meal preparation?: Yes (family helps prepare meals) Do you need assistance with eating?: No Do you have difficulty maintaining continence: No Do you need assistance with getting out of bed/getting out of a chair/moving?: No Do you have difficulty managing or taking your medications?: No  Follow up appointments reviewed: PCP Follow-up appointment confirmed?: No (Patient will call PCP office and schedule hospital follow up appointment) MD Provider Line [phone] Given: No Specialist Hospital Follow-up appointment confirmed?: Yes Date of Specialist follow-up appointment?: 08/29/23 Follow-Up Specialty Provider:: Dr. Malvin Do you need transportation to your follow-up appointment?: No Do you understand care options if your condition(s) worsen?: Yes-patient verbalized understanding  SDOH Interventions Today    Flowsheet Row Most Recent Value  SDOH Interventions   Food Insecurity Interventions Intervention Not Indicated  Housing Interventions Intervention Not Indicated  Transportation Interventions Intervention Not Indicated  Utilities Interventions Intervention Not Indicated    Andrea Dimes RN, BSN [place]  [facility] RN Care Manager [phone]

## 2023-08-26 NOTE — Telephone Encounter (Signed)
 Almira from [facility] called to get verbal order for pt's PT  Verbal order given:  Pt to get PT 2x/2wks and 1x/3wks

## 2023-08-26 NOTE — Care Management Important Message (Signed)
 Important Message  Patient Details  Name: Joanna [name] MRN: [id number] Date of Birth: Jun 14, 1960   Important Message Given:  Yes - Medicare IM Patient left prior to IM delivery will mail to the patient home address.     Deshauna Cayson 08/26/2023, 4:49 PM

## 2023-08-27 ENCOUNTER — Encounter (HOSPITAL_COMMUNITY): Payer: Self-pay | Admitting: Podiatry

## 2023-08-27 NOTE — Anesthesia Postprocedure Evaluation (Signed)
 Anesthesia Post Note  Patient: Joanna [name]  Procedure(s) Performed: AMPUTATION, FOOT, TRANSMETATARSAL (Right: Toe)     Patient location during evaluation: PACU Anesthesia Type: Regional and MAC Level of consciousness: awake and alert Pain management: pain level controlled Vital Signs Assessment: post-procedure vital signs reviewed and stable Respiratory status: spontaneous breathing, nonlabored ventilation and respiratory function stable Cardiovascular status: stable and blood pressure returned to baseline Postop Assessment: no apparent nausea or vomiting Anesthetic complications: no   No notable events documented.             Anothony Bursch

## 2023-08-29 ENCOUNTER — Ambulatory Visit (INDEPENDENT_AMBULATORY_CARE_PROVIDER_SITE_OTHER): Admitting: Podiatry

## 2023-08-29 DIAGNOSIS — I96 Gangrene, not elsewhere classified: Secondary | ICD-10-CM

## 2023-08-29 DIAGNOSIS — Z9889 Other specified postprocedural states: Secondary | ICD-10-CM

## 2023-08-29 DIAGNOSIS — I70223 Atherosclerosis of native arteries of extremities with rest pain, bilateral legs: Secondary | ICD-10-CM

## 2023-08-29 NOTE — Progress Notes (Signed)
  Subjective:  Patient ID: Joanna [name], female    DOB: Nov 16, 1960,  MRN: [id number]   DOS: 08/21/23 Procedure: 1. Transmetatarsal amputation of right foot 2. Application of amniotic graft 5x5 cm, right foot  63 y.o. female seen for post op check.  Patient seen for first office visit status post above procedures approximately 1 week out from surgery.  She is doing well she denies pain has left the dressing clean dry and intact as instructed in a wheelchair and trying to stay off the foot has a postop shoe on as well.  Review of Systems: Negative except as noted in the HPI. Denies N/V/F/Ch.   Objective:   Constitutional Well developed. Well nourished.  Vascular Foot warm and well perfused. Capillary refill normal to all digits.   No calf pain with palpation  Neurologic Normal speech. Oriented to person, place, and time. Epicritic sensation intact  Dermatologic Amputation site is well coapted no dehiscence drainage erythema or any evidence of infection or necrosis healing very well   Orthopedic: S/p R foot TMA   Radiographs: Interval amputation of the first through fifth proximal metatarsals.   Pathology:  A. RIGHT FOOT, TRANSMETATARSAL AMPUTATION:  Dry gangrene/coagulative necrosis  Bone margin negative for osteomyelitis   Micro: None  Assessment:   1. Gangrene of toe of right foot (HCC)   2. Critical limb ischemia of both lower extremities (HCC)   3. Post-operative state    Gangrene of right forefoot s/p Transmetatarsal amputation  Plan:  Patient was evaluated and treated and all questions answered.  1 week s/p R foot TMA with graft application. -Progressing as expected post operatively.  Amputation site healing very well without necrosis or infection noted -XR: expected post op changes -WB Status: NWB in post op shoe for protection when up with PT, does not need to wear in bed -Sutures: remain intact 2-3 weeks. -Medications/ABX: Status post 5 days doxycycline   postop no further antibiotics indicated -Dressing: Dressing changed replace new Xeroform and gauze dressing - F/u Plan: Follow-up in 1.5 weeks        [name], DPM [facility]

## 2023-09-02 ENCOUNTER — Telehealth: Payer: Self-pay | Admitting: Internal Medicine

## 2023-09-02 NOTE — Telephone Encounter (Signed)
 Called patient, verified name and date of birth. Patient has been scheduled for the next available appointment date. 09/12/2023 at 3:50 pm. Patient acknowledged appointment.

## 2023-09-02 NOTE — Telephone Encounter (Signed)
 Copied from CRM [id number]. Topic: Appointments - Scheduling Inquiry for Clinic >> Sep 02, 2023  9:29 AM Tobias CROME wrote:  Reason for CRM: Patient requesting appointment this week for hospital f/u. Patient discharged from [facility] on 08/23/23. No available appointments within 14 days.   Please reach out to patient to schedule, [phone]

## 2023-09-03 ENCOUNTER — Other Ambulatory Visit: Payer: Self-pay | Admitting: *Deleted

## 2023-09-03 NOTE — Patient Instructions (Signed)
 Visit Information  Thank you for taking time to visit with me today. Please don't hesitate to contact me if I can be of assistance to you before our next scheduled telephone appointment.   Following is a copy of your care plan:   Goals Addressed             This Visit's Progress    VBCI Transitions of Care (TOC) Care Plan       Problems:  Recent Hospitalization for treatment of PAD, s/p transmetatarsal amputation of right foot No Hospital Follow Up Provider appointment Patient will call and schedule, once she checks with her sister(transportation)  Goal:  Over the next 30 days, the patient will not experience hospital readmission  Interventions:  Transitions of Care: Doctor Visits  - discussed the importance of doctor visits Post discharge activity limitations prescribed by provider reviewed Post-op wound/incision care reviewed with patient/caregiver Reviewed Signs and symptoms of infection Reviewed post op visit with Dr. Malvin and discussed   Patient Self Care Activities:  Attend all scheduled provider appointments Call provider office for new concerns or questions  Participate in Transition of Care Program/Attend [facility] scheduled calls Take medications as prescribed    Plan:  Telephone follow up appointment with care management team member scheduled for:  09/11/23 at 12:30 pm        The patient verbalized understanding of instructions, educational materials, and care plan provided today and agreed to receive a mailed copy of patient instructions, educational materials, and care plan.   Telephone follow up appointment with care management team member scheduled for:09/11/23 at 12:30pm  Please call the care guide team at [phone] if you need to cancel or reschedule your appointment.   Please call [phone]-TALK (toll free, 24 hour hotline) go to [facility] [address], [place] [phone]) call 911 if you are experiencing  a Mental Health or Behavioral Health Crisis or need someone to talk to.  Andrea Dimes RN, BSN [place]  [facility] RN Care Manager [phone]

## 2023-09-03 NOTE — Transitions of Care (Post Inpatient/ED Visit) (Signed)
 Transition of Care week 2  Visit Note  09/03/2023  Name: Joanna [name] MRN: [id number]          DOB: [birth year]  Situation: Patient enrolled in [facility] 30-day program. Visit completed with Joanna [name] by telephone.   Background:   Initial Transition Care Management Follow-up Telephone Call    Past Medical History:  Diagnosis Date   Atrial fibrillation with RVR (HCC)    Chronic combined systolic (congestive) and diastolic (congestive) heart failure (HCC)    Dyspnea    Goiter    Hypertension    NICM (nonischemic cardiomyopathy) (HCC)    a. 08/2005 Echo: EF 30-35%, mod diff HK. Mild to Mod MR. Mildly dil LA; b. 08/2005 Cath: Nl Cors. Elevated CO w/o shunt; c. 09/2007 Echo: EF 45%. Mild to mod MR; c. 03/2015 Echo: EF 45%, global HK. Gr1 DD. Mild MR. Mildly dil LA.    Assessment: Patient Reported Symptoms: Cognitive Cognitive Status: Able to follow simple commands, Alert and oriented to person, place, and time, Normal speech and language skills, Insightful and able to interpret abstract concepts      Neurological Neurological Review of Symptoms: No symptoms reported    HEENT HEENT Symptoms Reported: No symptoms reported      Cardiovascular Cardiovascular Symptoms Reported: No symptoms reported    Respiratory Respiratory Symptoms Reported: No symptoms reported    Endocrine Endocrine Symptoms Reported: Not assessed    Gastrointestinal Gastrointestinal Symptoms Reported: Constipation Additional Gastrointestinal Details: Constipation last week-resolved. LBM 09/02/23, no longer taking the pain medication.      Genitourinary Genitourinary Symptoms Reported: No symptoms reported    Integumentary Integumentary Symptoms Reported: Incision Additional Integumentary Details: dressing intact, keeping dry and clean. Follow up with Podiatry on 09/10/23 Skin Self-Management Outcome: 4 (good)  Musculoskeletal Musculoskelatal Symptoms Reviewed: Limited mobility Additional Musculoskeletal Details:  HH PT twice weekly. Patient is now comfortable with going up the steps and will work on going down the steps this week. Musculoskeletal Management Strategies: Routine screening, Coping strategies, Exercise Musculoskeletal Self-Management Outcome: 4 (good) Falls in the past year?: No    Psychosocial Psychosocial Symptoms Reported: No symptoms reported         There were no vitals filed for this visit.  Medications Reviewed Today     Reviewed by Lucky Andrea LABOR, RN (Registered Nurse) on 09/03/23 at 1050  Med List Status: <None>   Medication Order Taking? Sig Documenting Provider Last Dose Status Informant  atorvastatin  (LIPITOR) 40 MG tablet [id number]  Take 1 tablet (40 mg total) by mouth daily. Vicci Barnie NOVAK, MD  Active Self  carvedilol  (COREG ) 6.25 MG tablet [id number]  TAKE 1 TABLET(6.25 MG) BY MOUTH TWICE DAILY Sabharwal, Aditya, DO  Active Self           Med Note CHRISTIE, JEANNETTA   Thu Aug 15, 2023  2:27 PM)    clopidogrel  (PLAVIX ) 75 MG tablet [id number]  Take 1 tablet (75 mg total) by mouth daily. Magda Debby SAILOR, MD  Active Self  dofetilide  (TIKOSYN ) 125 MCG capsule [id number]  Take 1 capsule (125 mcg total) by mouth 2 (two) times daily. Terra Fairy PARAS, PA-C  Active Self  ELIQUIS  5 MG TABS tablet [id number]  TAKE 1 TABLET BY MOUTH TWICE  DAILY Nahser, Aleene PARAS, MD  Active Self           Med Note CHRISTIE ALYSON Schaumann Aug 15, 2023  2:29 PM) Patient confirmed taking  feeding supplement [facility]) LIQD [id number]  Take  237 mLs by mouth 2 (two) times daily. If she has any [provider]  Active Self  furosemide  (LASIX ) 40 MG tablet [id number]  Take 40 mg by mouth daily. [provider]  Active Self           Med Note [name] Aug 12, 2023  3:03 PM) Patient confirmed taking LF 11/24  methimazole  (TAPAZOLE ) 5 MG tablet [id number]  Take 1 tablet (5 mg total) by mouth daily. Terra Fairy PARAS, PA-C  Active Self  oxyCODONE -acetaminophen   (PERCOCET/ROXICET) 5-325 MG tablet [id number]  Take 1 tablet by mouth every 6 (six) hours as needed for severe pain (pain score 7-10).  Patient not taking: Reported on 09/03/2023   Janit Thresa HERO, DPM  Active Self  potassium chloride  (KLOR-CON ) 10 MEQ tablet [id number]  TAKE 1 TABLET(10 MEQ) BY MOUTH DAILY Terra Fairy PARAS, PA-C  Active Self  spironolactone  (ALDACTONE ) 25 MG tablet [id number]  Take 0.5 tablets (12.5 mg total) by mouth daily. Vicci Barnie NOVAK, MD  Active Self            Recommendation:   Continue Current Plan of Care  Follow Up Plan:   Telephone follow-up in 1 week  Andrea Dimes RN, BSN [place]  [facility] Institute [facility] Health RN Care Manager [phone]

## 2023-09-06 ENCOUNTER — Other Ambulatory Visit: Payer: Self-pay | Admitting: Cardiology

## 2023-09-06 ENCOUNTER — Other Ambulatory Visit: Payer: Self-pay | Admitting: Vascular Surgery

## 2023-09-06 DIAGNOSIS — I739 Peripheral vascular disease, unspecified: Secondary | ICD-10-CM

## 2023-09-06 DIAGNOSIS — I70238 Atherosclerosis of native arteries of right leg with ulceration of other part of lower right leg: Secondary | ICD-10-CM

## 2023-09-10 ENCOUNTER — Ambulatory Visit (INDEPENDENT_AMBULATORY_CARE_PROVIDER_SITE_OTHER): Admitting: Podiatry

## 2023-09-10 DIAGNOSIS — I70223 Atherosclerosis of native arteries of extremities with rest pain, bilateral legs: Secondary | ICD-10-CM

## 2023-09-10 DIAGNOSIS — I96 Gangrene, not elsewhere classified: Secondary | ICD-10-CM

## 2023-09-10 DIAGNOSIS — Z9889 Other specified postprocedural states: Secondary | ICD-10-CM

## 2023-09-10 NOTE — Progress Notes (Signed)
  Subjective:  Patient ID: Joanna [name], female    DOB: Nov 15, 1960,  MRN: [id number]   DOS: 08/21/23 Procedure: 1. Transmetatarsal amputation of right foot 2. Application of amniotic graft 5x5 cm, right foot  63 y.o. female seen for post op check.  Patient seen for first office visit status post above procedures approximately 3 week out from surgery.   Has been putting some weight down in the postop shoe but otherwise trying to stay off her foot left the dressing clean dry and intact since last appointment as instructed.  Finish course of antibiotics.  Review of Systems: Negative except as noted in the HPI. Denies N/V/F/Ch.   Objective:   Constitutional Well developed. Well nourished.  Vascular Foot warm and well perfused. Capillary refill normal to all digits.   No calf pain with palpation  Neurologic Normal speech. Oriented to person, place, and time. Epicritic sensation intact  Dermatologic Amputation site is well coapted no dehiscence drainage erythema or any evidence of infection or necrosis healing very well     Orthopedic: S/p R foot TMA   Radiographs: Interval amputation of the first through fifth proximal metatarsals.   Pathology:  A. RIGHT FOOT, TRANSMETATARSAL AMPUTATION:  Dry gangrene/coagulative necrosis  Bone margin negative for osteomyelitis   Micro: None  Assessment:   1. Gangrene of toe of right foot (HCC)   2. Critical limb ischemia of both lower extremities (HCC)   3. Post-operative state     Gangrene of right forefoot s/p Transmetatarsal amputation  Plan:  Patient was evaluated and treated and all questions answered.  3 week s/p R foot TMA with graft application. -Progressing as expected post operatively.  Amputation site healing very well without necrosis or infection noted Continue to heal well -XR: expected post op changes -WB Status: Partial weightbearing to heel only in the postop shoe for short distances -Sutures: Removed in total at  this visit -Medications/ABX: No further antibiotics indicated -Dressing: Applied Steri-Strip and dry gauze dressing.  She is now okay to wash the left foot gently with warm soapy water and then dry with Betadine and apply a new dressing as needed - F/u Plan: Follow-up in 2 weeks        Joanna [name], DPM [facility] / [facility]

## 2023-09-11 ENCOUNTER — Encounter: Payer: Self-pay | Admitting: *Deleted

## 2023-09-11 ENCOUNTER — Telehealth: Payer: Self-pay | Admitting: *Deleted

## 2023-09-12 ENCOUNTER — Encounter: Payer: Self-pay | Admitting: *Deleted

## 2023-09-12 ENCOUNTER — Inpatient Hospital Stay (INDEPENDENT_AMBULATORY_CARE_PROVIDER_SITE_OTHER): Admitting: Primary Care

## 2023-09-13 ENCOUNTER — Telehealth: Payer: Self-pay | Admitting: *Deleted

## 2023-09-13 NOTE — Transitions of Care (Post Inpatient/ED Visit) (Signed)
 Transition of Care week 3  Visit Note  09/13/2023  Name: Joanna [name] MRN: [id number]          DOB: Sep 27, 1960  Situation: Patient enrolled in [facility] 30-day program. Visit completed with Ms. Staib by telephone.   Background:   Initial Transition Care Management Follow-up Telephone Call    Past Medical History:  Diagnosis Date   Atrial fibrillation with RVR (HCC)    Chronic combined systolic (congestive) and diastolic (congestive) heart failure (HCC)    Dyspnea    Goiter    Hypertension    NICM (nonischemic cardiomyopathy) (HCC)    a. 08/2005 Echo: EF 30-35%, mod diff HK. Mild to Mod MR. Mildly dil LA; b. 08/2005 Cath: Nl Cors. Elevated CO w/o shunt; c. 09/2007 Echo: EF 45%. Mild to mod MR; c. 03/2015 Echo: EF 45%, global HK. Gr1 DD. Mild MR. Mildly dil LA.    Assessment: Patient Reported Symptoms: Cognitive Cognitive Status: No symptoms reported, Able to follow simple commands, Normal speech and language skills, Alert and oriented to person, place, and time, Insightful and able to interpret abstract concepts      Neurological Neurological Review of Symptoms: No symptoms reported    HEENT HEENT Symptoms Reported: Not assessed      Cardiovascular Cardiovascular Symptoms Reported: No symptoms reported    Respiratory Respiratory Symptoms Reported: No symptoms reported    Endocrine Endocrine Symptoms Reported: Not assessed    Gastrointestinal Gastrointestinal Symptoms Reported: No symptoms reported      Genitourinary Genitourinary Symptoms Reported: Not assessed    Integumentary Integumentary Symptoms Reported: Wound Additional Integumentary Details: Staples removed by Podiatry on 09/10/23. Bearing a little weight and doing good. Tired of hopping Skin Self-Management Outcome: 4 (good) Skin Comment: Follow up on 09/24/23 with Podiatry. Continues to work on exercises provided by PT  Musculoskeletal Musculoskelatal Symptoms Reviewed: Limited mobility Musculoskeletal Management  Strategies: Adequate rest, Routine screening, Medical device, Coping strategies Musculoskeletal Self-Management Outcome: 4 (good) Musculoskeletal Comment: HH PT twice weekly      Psychosocial Psychosocial Symptoms Reported: Not assessed         There were no vitals filed for this visit.  Medications Reviewed Today     Reviewed by Lucky Andrea LABOR, RN (Registered Nurse) on 09/13/23 at 1507  Med List Status: <None>   Medication Order Taking? Sig Documenting Provider Last Dose Status Informant  atorvastatin  (LIPITOR) 40 MG tablet [id number] Yes Take 1 tablet (40 mg total) by mouth daily. Vicci Barnie NOVAK, MD  Active Self  carvedilol  (COREG ) 6.25 MG tablet [id number] Yes TAKE 1 TABLET(6.25 MG) BY MOUTH TWICE DAILY Sabharwal, Aditya, DO  Active Self           Med Note CHRISTIE, JEANNETTA   Thu Aug 15, 2023  2:27 PM)    clopidogrel  (PLAVIX ) 75 MG tablet [id number] Yes Take 1 tablet (75 mg total) by mouth daily. Magda Debby SAILOR, MD  Active Self  dofetilide  (TIKOSYN ) 125 MCG capsule [id number] Yes Take 1 capsule (125 mcg total) by mouth 2 (two) times daily. Terra Fairy PARAS, PA-C  Active Self  ELIQUIS  5 MG TABS tablet [id number] Yes TAKE 1 TABLET BY MOUTH TWICE  DAILY Nahser, Aleene PARAS, MD  Active Self           Med Note CHRISTIE ALYSON Schaumann Aug 15, 2023  2:29 PM) Patient confirmed taking  feeding supplement [facility]) LIQD [id number] Yes Take 237 mLs by mouth 2 (two) times daily. If she has any [provider]  Active Self  furosemide  (LASIX ) 40 MG tablet [id number] Yes Take 40 mg by mouth daily. [provider]  Active Self           Med Note [name] Aug 12, 2023  3:03 PM) Patient confirmed taking LF 11/24  methimazole  (TAPAZOLE ) 5 MG tablet [id number] Yes Take 1 tablet (5 mg total) by mouth daily. Terra Fairy PARAS, PA-C  Active Self  potassium chloride  (KLOR-CON ) 10 MEQ tablet [id number] Yes TAKE 1 TABLET(10 MEQ) BY MOUTH DAILY Terra Fairy PARAS, PA-C   Active Self  spironolactone  (ALDACTONE ) 25 MG tablet [id number] Yes Take 0.5 tablets (12.5 mg total) by mouth daily. Vicci Barnie NOVAK, MD  Active Self            Recommendation:   Continue Current Plan of Care  Follow Up Plan:   Telephone follow-up in 1 week  Andrea Dimes RN, BSN [place]  Value[facility]Based Care Institute [facility] Health RN Care Manager [phone]

## 2023-09-13 NOTE — Patient Instructions (Signed)
 Visit Information  Thank you for taking time to visit with me today. Please don't hesitate to contact me if I can be of assistance to you before our next scheduled telephone appointment.   Following is a copy of your care plan:   Goals Addressed             This Visit's Progress    VBCI Transitions of Care (TOC) Care Plan       Problems:  Recent Hospitalization for treatment of PAD, s/p transmetatarsal amputation of right foot No Hospital Follow Up Provider appointment Patient will call and schedule, once she checks with her sister(transportation)  Goal:  Over the next 30 days, the patient will not experience hospital readmission  Interventions:  Transitions of Care: Doctor Visits  - discussed the importance of doctor visits Post-op wound/incision care reviewed with patient/caregiver Reviewed Signs and symptoms of infection Reviewed post op visit with Dr. Malvin reviewed and discussed  Discussed patient had to cancel hospital follow up visit due to a death in the family, has an appointment with Dr. Vicci on 09/17/23  Patient Self Care Activities:  Attend all scheduled provider appointments Call provider office for new concerns or questions  Participate in Transition of Care Program/Attend [facility] scheduled calls Take medications as prescribed    Plan:  Telephone follow up appointment with care management team member scheduled for:  09/23/23 at 1:15 pm        The patient verbalized understanding of instructions, educational materials, and care plan provided today and agreed to receive a mailed copy of patient instructions, educational materials, and care plan.   Telephone follow up appointment with care management team member scheduled for:09/23/23 at 1:15pm  Please call the care guide team at [phone] if you need to cancel or reschedule your appointment.   Please call 1-800-273-TALK (toll free, 24 hour hotline) go to [facility] Urgent [facility]  [address] [place] [phone]) call 911 if you are experiencing a Mental Health or Behavioral Health Crisis or need someone to talk to.  Andrea Dimes RN, BSN [place]  Value[facility]Based Care Institute [facility] Health RN Care Manager [phone]

## 2023-09-17 ENCOUNTER — Ambulatory Visit: Admitting: Internal Medicine

## 2023-09-23 ENCOUNTER — Other Ambulatory Visit: Payer: Self-pay | Admitting: *Deleted

## 2023-09-23 NOTE — Transitions of Care (Post Inpatient/ED Visit) (Signed)
 Transition of Care week 4  Visit Note  09/23/2023  Name: Joanna [name] MRN: [id number]          DOB: 05/22/1960  Situation: Patient enrolled in [facility] 30-day program. Visit completed with Ms. Danser by telephone.   Background:   Initial Transition Care Management Follow-up Telephone Call    Past Medical History:  Diagnosis Date   Atrial fibrillation with RVR (HCC)    Chronic combined systolic (congestive) and diastolic (congestive) heart failure (HCC)    Dyspnea    Goiter    Hypertension    NICM (nonischemic cardiomyopathy) (HCC)    a. 08/2005 Echo: EF 30-35%, mod diff HK. Mild to Mod MR. Mildly dil LA; b. 08/2005 Cath: Nl Cors. Elevated CO w/o shunt; c. 09/2007 Echo: EF 45%. Mild to mod MR; c. 03/2015 Echo: EF 45%, global HK. Gr1 DD. Mild MR. Mildly dil LA.    Assessment: Patient Reported Symptoms: Cognitive Cognitive Status: Able to follow simple commands, Normal speech and language skills, Alert and oriented to person, place, and time, Insightful and able to interpret abstract concepts      Neurological Neurological Review of Symptoms: No symptoms reported    HEENT HEENT Symptoms Reported: Not assessed      Cardiovascular Cardiovascular Symptoms Reported: No symptoms reported    Respiratory Respiratory Symptoms Reported: No symptoms reported    Endocrine Endocrine Symptoms Reported: Not assessed    Gastrointestinal Gastrointestinal Symptoms Reported: No symptoms reported      Genitourinary Genitourinary Symptoms Reported: No symptoms reported    Integumentary Integumentary Symptoms Reported: Wound Additional Integumentary Details: Patient reapplied steristrips, keeping wound clean and dry. Noted a small amt of blood 2 days ago. No bleeding since. Denies fever, swelling or pain. Skin Management Strategies: Coping strategies Skin Self-Management Outcome: 4 (good) Skin Comment: Continues with PT. Follow up with Dr. Malvin on 10/01/23.  Musculoskeletal  Musculoskelatal Symptoms Reviewed: Limited mobility Additional Musculoskeletal Details: Continues with HH PT twice weekly. Musculoskeletal Self-Management Outcome: 4 (good) Musculoskeletal Comment: ambulates with walker      Psychosocial Psychosocial Symptoms Reported: Not assessed         There were no vitals filed for this visit.  Medications Reviewed Today     Reviewed by Lucky Andrea LABOR, RN (Registered Nurse) on 09/23/23 at 1344  Med List Status: <None>   Medication Order Taking? Sig Documenting Provider Last Dose Status Informant  atorvastatin  (LIPITOR) 40 MG tablet [id number] Yes Take 1 tablet (40 mg total) by mouth daily. Vicci Barnie NOVAK, MD  Active Self  carvedilol  (COREG ) 6.25 MG tablet [id number] Yes TAKE 1 TABLET(6.25 MG) BY MOUTH TWICE DAILY Sabharwal, Aditya, DO  Active Self           Med Note CHRISTIE, JEANNETTA   Thu Aug 15, 2023  2:27 PM)    clopidogrel  (PLAVIX ) 75 MG tablet [id number] Yes Take 1 tablet (75 mg total) by mouth daily. Magda Debby SAILOR, MD  Active Self  dofetilide  (TIKOSYN ) 125 MCG capsule [id number] Yes Take 1 capsule (125 mcg total) by mouth 2 (two) times daily. Terra Fairy PARAS, PA-C  Active Self  ELIQUIS  5 MG TABS tablet [id number] Yes TAKE 1 TABLET BY MOUTH TWICE  DAILY Nahser, Aleene PARAS, MD  Active Self           Med Note CHRISTIE ALYSON Schaumann Aug 15, 2023  2:29 PM) Patient confirmed taking  feeding supplement [facility]) LIQD [id number] Yes Take 237 mLs by mouth 2 (two) times daily.  If she has any [provider]  Active Self  furosemide  (LASIX ) 40 MG tablet [id number] Yes Take 40 mg by mouth daily. [provider]  Active Self           Med Note [name] Aug 12, 2023  3:03 PM) Patient confirmed taking LF 11/24  methimazole  (TAPAZOLE ) 5 MG tablet [id number] Yes Take 1 tablet (5 mg total) by mouth daily. Terra Fairy PARAS, PA-C  Active Self  potassium chloride  (KLOR-CON ) 10 MEQ tablet [id number] Yes TAKE 1  TABLET(10 MEQ) BY MOUTH DAILY Terra Fairy PARAS, PA-C  Active Self  spironolactone  (ALDACTONE ) 25 MG tablet [id number] Yes Take 0.5 tablets (12.5 mg total) by mouth daily. Vicci Barnie NOVAK, MD  Active Self            Recommendation:   Continue Current Plan of Care  Follow Up Plan:   Closing From:  Transitions of Care Program  Andrea Dimes RN, BSN [place]  Value-Based Care Institute [facility] Health RN Care Manager [phone]

## 2023-09-23 NOTE — Patient Instructions (Signed)
 Visit Information  Thank you for taking time to visit with me today. Please don't hesitate to contact me if I can be of assistance to you before our next scheduled telephone appointment.   Following is a copy of your care plan:   Goals Addressed             This Visit's Progress    COMPLETED: VBCI Transitions of Care (TOC) Care Plan       Problems:  Recent Hospitalization for treatment of PAD, s/p transmetatarsal amputation of right foot No Hospital Follow Up Provider appointment Patient will call and schedule, once she checks with her sister(transportation)  Goal: Met Over the next 30 days, the patient will not experience hospital readmission  Interventions:  Transitions of Care: Doctor Visits  - discussed the importance of doctor visits Post-op wound/incision care reviewed with patient/caregiver Reviewed Signs and symptoms of infection Reviewed post op visit with Dr. Malvin reviewed and discussed  Discussed patient had to cancel hospital follow up visit due to a death in the family, has an appointment with Dr. Vicci on 10/24/23 Discussed referral to Longitudinal Case Management-patient declined  Patient Self Care Activities:  Attend all scheduled provider appointments Call provider office for new concerns or questions  Participate in Transition of Care Program/Attend TOC scheduled calls Take medications as prescribed    Plan:  No further follow up required:          The patient verbalized understanding of instructions, educational materials, and care plan provided today and DECLINED offer to receive copy of patient instructions, educational materials, and care plan.   No further follow up required:    Please call the care guide team at [phone] if you need to cancel or reschedule your appointment.   Please call 1-800-273-TALK (toll free, 24 hour hotline) go to [facility] Urgent [facility] [address], [place] [phone]) call  911 if you are experiencing a Mental Health or Behavioral Health Crisis or need someone to talk to.  Andrea Dimes RN, BSN   Value-Based Care Institute [facility] Health RN Care Manager [phone]

## 2023-09-24 ENCOUNTER — Encounter: Admitting: Podiatry

## 2023-10-01 ENCOUNTER — Ambulatory Visit (INDEPENDENT_AMBULATORY_CARE_PROVIDER_SITE_OTHER): Admitting: Podiatry

## 2023-10-01 ENCOUNTER — Encounter: Payer: Self-pay | Admitting: Podiatry

## 2023-10-01 DIAGNOSIS — I70223 Atherosclerosis of native arteries of extremities with rest pain, bilateral legs: Secondary | ICD-10-CM

## 2023-10-01 DIAGNOSIS — Z9889 Other specified postprocedural states: Secondary | ICD-10-CM

## 2023-10-01 DIAGNOSIS — I96 Gangrene, not elsewhere classified: Secondary | ICD-10-CM

## 2023-10-01 MED ORDER — CYCLOBENZAPRINE HCL 5 MG PO TABS: 5.0000 mg | ORAL_TABLET | Freq: Three times a day (TID) | ORAL | 1 refills | Status: DC | PRN

## 2023-10-01 MED ORDER — OXYCODONE-ACETAMINOPHEN 5-325 MG PO TABS: 1.0000 | ORAL_TABLET | ORAL | 0 refills | Status: AC | PRN

## 2023-10-01 NOTE — Progress Notes (Signed)
  Subjective:  Patient ID: Joanna [name], female    DOB: 09-16-1960,  MRN: [id number]   DOS: 08/21/23 Procedure: 1. Transmetatarsal amputation of right foot 2. Application of amniotic graft 5x5 cm, right foot  63 y.o. female seen for post op check.  Now approximately 5 weeks out from above surgery.  Has been doing well has been doing gauze dressing changes to the amputation site.  Having some pain occasionally as well as muscle spasms.  Review of Systems: Negative except as noted in the HPI. Denies N/V/F/Ch.   Objective:   Constitutional Well developed. Well nourished.  Vascular Foot warm and well perfused. Capillary refill normal to all digits.   No calf pain with palpation  Neurologic Normal speech. Oriented to person, place, and time. Epicritic sensation intact  Dermatologic Amputation site overall healing very well there is a small area of superficial dehiscence at the lateral aspect of the amputation site that is superficial with healthy granular tissue in the wound bed after debridement     Orthopedic: S/p R foot TMA   Radiographs: Interval amputation of the first through fifth proximal metatarsals.   Pathology:  A. RIGHT FOOT, TRANSMETATARSAL AMPUTATION:  Dry gangrene/coagulative necrosis  Bone margin negative for osteomyelitis   Micro: None  Assessment:   1. Gangrene of toe of right foot (HCC)   2. Critical limb ischemia of both lower extremities (HCC)   3. Post-operative state    Gangrene of right forefoot s/p Transmetatarsal amputation  Plan:  Patient was evaluated and treated and all questions answered.  5 week s/p R foot TMA with graft application. -Progressing as expected post operatively.  Amputation site continues to heal aside from the small area at the lateral aspect which is superficial and seems to be improving with wound care -Recommend we proceed with Betadine and dry gauze dressing or adhesive border foam dressing to the small area at the  lateral aspect of the amputation site.  Change daily. demonstrated this for the patient that she will be able to do herself -XR: expected post op changes -WB Status: Okay for weightbearing as tolerated flat in the postop shoe at this point in time -Sutures: Previously removed -Medications/ABX: No further antibiotics indicated -Dressing: As above Betadine and dry gauze dressing or border foam adhesive dressing - F/u Plan: Follow-up in 3 weeks        [name], DPM [facility] / [facility]

## 2023-10-02 ENCOUNTER — Ambulatory Visit (HOSPITAL_COMMUNITY): Admit: 2023-10-02

## 2023-10-02 ENCOUNTER — Ambulatory Visit

## 2023-10-02 ENCOUNTER — Ambulatory Visit (HOSPITAL_COMMUNITY)

## 2023-10-02 NOTE — Progress Notes (Deleted)
 HISTORY AND PHYSICAL     CC:  follow up. Requesting Provider:  Vicci Barnie NOVAK, MD  HPI: This is a 63 y.o. female who is here today for follow up for PAD.  Pt has hx of  angiogram with balloon angioplasty of the right TPT, PTA on 03/27/2023 by Dr. Lanis for blue toe syndrome.  She underwent angiogram with right EIA angioplasty and stent on 03/07/2023 for CLI with rest pain by Dr. Gretta.  On 08/19/2023, she underwent angiogram with stent of the right PTA and TPT and right SFA by Dr. Sheree for gangrene multiple toes.  On 08/21/2023, she underwent right TMA by Dr. Malvin.   The pt returns today for follow up.  ***  The pt is on a statin for cholesterol management.    The pt is not on an aspirin .    Other AC:  Plavix  The pt is on BB, diuretic for hypertension.  The pt is not on diabetic medication. Tobacco hx:  current***  Pt does *** have family hx of AAA.  Past Medical History:  Diagnosis Date   Atrial fibrillation with RVR (HCC)    Chronic combined systolic (congestive) and diastolic (congestive) heart failure (HCC)    Dyspnea    Goiter    Hypertension    NICM (nonischemic cardiomyopathy) (HCC)    a. 08/2005 Echo: EF 30-35%, mod diff HK. Mild to Mod MR. Mildly dil LA; b. 08/2005 Cath: Nl Cors. Elevated CO w/o shunt; c. 09/2007 Echo: EF 45%. Mild to mod MR; c. 03/2015 Echo: EF 45%, global HK. Gr1 DD. Mild MR. Mildly dil LA.    Past Surgical History:  Procedure Laterality Date   ABDOMINAL AORTOGRAM N/A 08/19/2023   Procedure: ABDOMINAL AORTOGRAM;  Surgeon: Sheree Penne Bruckner, MD;  Location: [facility] INVASIVE CV LAB;  Service: Cardiovascular;  Laterality: N/A;   ABDOMINAL AORTOGRAM W/LOWER EXTREMITY N/A 03/07/2023   Procedure: ABDOMINAL AORTOGRAM W/LOWER EXTREMITY;  Surgeon: Gretta Bruckner PARAS, MD;  Location: [facility] INVASIVE CV LAB;  Service: Cardiovascular;  Laterality: N/A;   ABDOMINAL AORTOGRAM W/LOWER EXTREMITY N/A 03/27/2023   Procedure: ABDOMINAL AORTOGRAM W/LOWER EXTREMITY;   Surgeon: Lanis Fonda BRAVO, MD;  Location: [facility] INVASIVE CV LAB;  Service: Cardiovascular;  Laterality: N/A;   APPENDECTOMY     BACK SURGERY     CARDIOVERSION N/A 03/14/2018   Procedure: CARDIOVERSION;  Surgeon: Okey Vina GAILS, MD;  Location: [facility] ENDOSCOPY;  Service: Cardiovascular;  Laterality: N/A;   LOWER EXTREMITY ANGIOGRAPHY Right 08/19/2023   Procedure: Lower Extremity Angiography;  Surgeon: Sheree Penne Bruckner, MD;  Location: [facility] INVASIVE CV LAB;  Service: Cardiovascular;  Laterality: Right;   LOWER EXTREMITY INTERVENTION Right 08/19/2023   Procedure: LOWER EXTREMITY INTERVENTION;  Surgeon: Sheree Penne Bruckner, MD;  Location: [facility] INVASIVE CV LAB;  Service: Cardiovascular;  Laterality: Right;   PERIPHERAL VASCULAR BALLOON ANGIOPLASTY  03/27/2023   Procedure: PERIPHERAL VASCULAR BALLOON ANGIOPLASTY;  Surgeon: Lanis Fonda BRAVO, MD;  Location: [facility] INVASIVE CV LAB;  Service: Cardiovascular;;  Right AT   TEE WITHOUT CARDIOVERSION N/A 03/14/2018   Procedure: TRANSESOPHAGEAL ECHOCARDIOGRAM (TEE);  Surgeon: Okey Vina GAILS, MD;  Location: [facility] ENDOSCOPY;  Service: Cardiovascular;  Laterality: N/A;   TEE WITHOUT CARDIOVERSION N/A 02/26/2022   Procedure: TRANSESOPHAGEAL ECHOCARDIOGRAM (TEE);  Surgeon: Gardenia Led, DO;  Location: [facility] ENDOSCOPY;  Service: Cardiovascular;  Laterality: N/A;   TRANSMETATARSAL AMPUTATION Right 08/21/2023   Procedure: AMPUTATION, FOOT, TRANSMETATARSAL;  Surgeon: Malvin Marsa FALCON, DPM;  Location: [facility] OR;  Service: Orthopedics/Podiatry;  Laterality: Right;  Right  foot TMA    No Known Allergies  Current Outpatient Medications  Medication Sig Dispense Refill   atorvastatin  (LIPITOR) 40 MG tablet Take 1 tablet (40 mg total) by mouth daily. 90 tablet 2   carvedilol  (COREG ) 6.25 MG tablet TAKE 1 TABLET(6.25 MG) BY MOUTH TWICE DAILY 180 tablet 0   clopidogrel  (PLAVIX ) 75 MG tablet Take 1 tablet (75 mg total) by mouth daily. 90 tablet 4   cyclobenzaprine  (FLEXERIL ) 5 MG  tablet Take 1 tablet (5 mg total) by mouth 3 (three) times daily as needed for muscle spasms. 30 tablet 1   dofetilide  (TIKOSYN ) 125 MCG capsule Take 1 capsule (125 mcg total) by mouth 2 (two) times daily. 60 capsule 3   ELIQUIS  5 MG TABS tablet TAKE 1 TABLET BY MOUTH TWICE  DAILY 200 tablet 2   feeding supplement (ENSURE IMMUNE HEALTH) LIQD Take 237 mLs by mouth 2 (two) times daily. If she has any     furosemide  (LASIX ) 40 MG tablet Take 40 mg by mouth daily.     methimazole  (TAPAZOLE ) 5 MG tablet Take 1 tablet (5 mg total) by mouth daily. 90 tablet 3   oxyCODONE -acetaminophen  (PERCOCET) 5-325 MG tablet Take 1 tablet by mouth every 4 (four) hours as needed for up to 7 days for severe pain (pain score 7-10). 30 tablet 0   potassium chloride  (KLOR-CON ) 10 MEQ tablet TAKE 1 TABLET(10 MEQ) BY MOUTH DAILY 90 tablet 3   spironolactone  (ALDACTONE ) 25 MG tablet Take 0.5 tablets (12.5 mg total) by mouth daily. 45 tablet 3   No current facility-administered medications for this visit.    Family History  Problem Relation Age of Onset   Cancer Mother    Heart disease Father     Social History   Socioeconomic History   Marital status: Single    Spouse name: Not on file   Number of children: 1   Years of education: Not on file   Highest education level: High school graduate  Occupational History   Occupation: unemployed    Comment: on disablity  Tobacco Use   Smoking status: Every Day    Current packs/day: 0.00    Average packs/day: 2.0 packs/day for 30.0 years (60.0 ttl pk-yrs)    Types: Cigarettes    Start date: 02/09/1988    Last attempt to quit: 02/08/2018    Years since quitting: 5.6   Smokeless tobacco: Never   Tobacco comments:    3 cigarettes daily 02/09/22  Vaping Use   Vaping status: Never Used  Substance and Sexual Activity   Alcohol use: No    Comment: quit 2 years ago   Drug use: No   Sexual activity: Not Currently  Other Topics Concern   Not on file  Social History  Narrative   Not on file   Social Drivers of Health   Financial Resource Strain: Low Risk  (07/17/2022)   Overall Financial Resource Strain (CARDIA)    Difficulty of Paying Living Expenses: Not hard at all  Food Insecurity: No Food Insecurity (08/26/2023)   Hunger Vital Sign    Worried About Running Out of Food in the Last Year: Never true    Ran Out of Food in the Last Year: Never true  Transportation Needs: No Transportation Needs (08/26/2023)   PRAPARE - Administrator, Civil Service (Medical): No    Lack of Transportation (Non-Medical): No  Physical Activity: Insufficiently Active (07/17/2022)   Exercise Vital Sign    Days of  Exercise per Week: 3 days    Minutes of Exercise per Session: 30 min  Stress: No Stress Concern Present (07/17/2022)   [employer] of Occupational Health - Occupational Stress Questionnaire    Feeling of Stress : Not at all  Social Connections: Moderately Isolated (03/04/2023)   Social Connection and Isolation Panel    Frequency of Communication with Friends and Family: More than three times a week    Frequency of Social Gatherings with Friends and Family: Three times a week    Attends Religious Services: More than 4 times per year    Active Member of Clubs or Organizations: No    Attends Banker Meetings: Never    Marital Status: Never married  Intimate Partner Violence: Not At Risk (08/26/2023)   Humiliation, Afraid, Rape, and Kick questionnaire    Fear of Current or Ex-Partner: No    Emotionally Abused: No    Physically Abused: No    Sexually Abused: No     REVIEW OF SYSTEMS:  *** [X]  denotes positive finding, [ ]  denotes negative finding Cardiac  Comments:  Chest pain or chest pressure:    Shortness of breath upon exertion:    Short of breath when lying flat:    Irregular heart rhythm:        Vascular    Pain in calf, thigh, or hip brought on by ambulation:    Pain in feet at night that wakes you up from your  sleep:     Blood clot in your veins:    Leg swelling:         Pulmonary    Oxygen at home:    Productive cough:     Wheezing:         Neurologic    Sudden weakness in arms or legs:     Sudden numbness in arms or legs:     Sudden onset of difficulty speaking or slurred speech:    Temporary loss of vision in one eye:     Problems with dizziness:         Gastrointestinal    Blood in stool:     Vomited blood:         Genitourinary    Burning when urinating:     Blood in urine:        Psychiatric    Major depression:         Hematologic    Bleeding problems:    Problems with blood clotting too easily:        Skin    Rashes or ulcers:        Constitutional    Fever or chills:      PHYSICAL EXAMINATION:  ***  General:  WDWN in NAD; vital signs documented above Gait: Not observed HENT: WNL, normocephalic Pulmonary: normal non-labored breathing , without wheezing Cardiac: {Desc; regular/irreg:14544} HR, {With/Without:20273} carotid bruit*** Abdomen: soft, NT; aortic pulse is *** palpable Skin: {With/Without:20273} rashes Vascular Exam/Pulses:  Right Left  Radial {Exam; arterial pulse strength 0-4:30167} {Exam; arterial pulse strength 0-4:30167}  Femoral {Exam; arterial pulse strength 0-4:30167} {Exam; arterial pulse strength 0-4:30167}  Popliteal {Exam; arterial pulse strength 0-4:30167} {Exam; arterial pulse strength 0-4:30167}  DP {Exam; arterial pulse strength 0-4:30167} {Exam; arterial pulse strength 0-4:30167}  PT {Exam; arterial pulse strength 0-4:30167} {Exam; arterial pulse strength 0-4:30167}  Peroneal *** ***   Extremities: {With/Without:20273} ischemic changes, {With/Without:20273} Gangrene , {With/Without:20273} cellulitis; {With/Without:20273} open wounds Musculoskeletal: no muscle wasting or atrophy  Neurologic: A&O  X 3 Psychiatric:  The pt has {Desc; normal/abnormal:11317::Normal} affect.   Non-Invasive Vascular Imaging:   ABI's/TBI's on  10/02/2023: Right:  *** - Great toe pressure: *** Left:  *** - Great toe pressure: ***  Arterial duplex on 10/02/2023: ***  Previous ABI's/TBI's on 08/08/2023: Right:  0.94/gangrenous - Great toe pressure: not done Left:  1.25/0.61 - Great toe pressure:  77  Previous arterial duplex on 04/17/2023: IVC/Iliac: Patent right external iliac artery stent with no visualized  stenosis.     ASSESSMENT/PLAN:: 63 y.o. female here for follow up for PAD with hx of angiogram with balloon angioplasty of the right TPT, PTA on 03/27/2023 by Dr. Lanis for blue toe syndrome.  She underwent angiogram with right EIA angioplasty and stent on 03/07/2023 for CLI with rest pain by Dr. Gretta.  On 08/19/2023, she underwent angiogram with stent of the right PTA and TPT and right SFA by Dr. Sheree for gangrene multiple toes.  On 08/21/2023, she underwent right TMA by Dr. Malvin.    -*** -continue statin/plavix  -discussed importance of increased walking daily -pt will f/u in *** with ***.   Lucie Apt, [facility] [phone]  Clinic MD:   Sheree

## 2023-10-10 ENCOUNTER — Telehealth (HOSPITAL_COMMUNITY): Payer: Self-pay | Admitting: Cardiology

## 2023-10-10 NOTE — Telephone Encounter (Signed)
 Called to confirm/remind patient of their appointment at [facility] on 10/10/23.   Appointment:   [] Confirmed  [] Left mess   [x] No answer/No voice mail  [] VM Full/unable to leave message  [] Phone not in service  Patient reminded to bring all medications and/or complete list.  Confirmed patient has transportation. Gave directions, instructed to utilize valet parking.

## 2023-10-11 ENCOUNTER — Encounter (HOSPITAL_COMMUNITY): Admitting: Cardiology

## 2023-10-22 ENCOUNTER — Ambulatory Visit (INDEPENDENT_AMBULATORY_CARE_PROVIDER_SITE_OTHER): Admitting: Podiatry

## 2023-10-22 ENCOUNTER — Encounter: Payer: Self-pay | Admitting: Podiatry

## 2023-10-22 DIAGNOSIS — Z9889 Other specified postprocedural states: Secondary | ICD-10-CM

## 2023-10-22 DIAGNOSIS — I96 Gangrene, not elsewhere classified: Secondary | ICD-10-CM

## 2023-10-22 DIAGNOSIS — I70223 Atherosclerosis of native arteries of extremities with rest pain, bilateral legs: Secondary | ICD-10-CM

## 2023-10-22 MED ORDER — MUPIROCIN 2 % EX OINT: 1.0000 | TOPICAL_OINTMENT | Freq: Every day | CUTANEOUS | 0 refills | Status: DC

## 2023-10-22 NOTE — Progress Notes (Signed)
  Subjective:  Patient ID: Joanna [name], female    DOB: July 15, 1960,  MRN: [id number]   DOS: 08/21/23 Procedure: 1. Transmetatarsal amputation of right foot 2. Application of amniotic graft 5x5 cm, right foot  63 y.o. female seen for post op check.  Now approximately 8 weeks out from surgery.  Was recommended for Betadine and bordered foam dressing to the small wound on the outside of the amputation site.  She reports that she feels the postop shoe may have rubbed an area on the outside of the foot and caused it to open up slightly.  Having some pain with the area.  Has been doing Betadine and gauze dressing changes.  Review of Systems: Negative except as noted in the HPI. Denies N/V/F/Ch.   Objective:   Constitutional Well developed. Well nourished.  Vascular Foot warm and well perfused. Capillary refill normal to all digits.   No calf pain with palpation  Neurologic Normal speech. Oriented to person, place, and time. Epicritic sensation intact  Dermatologic Amputation site continues to improve over the majority of the surgical site however there is a small superficial dehiscence at the lateral aspect that probes down to subcutaneous fat tissue.  Very healthy with no evidence of infection.  Some fibrotic tissues that were lightly debrided with tissue nipper to healthy bleeding base     Orthopedic: S/p R foot TMA   Radiographs: Interval amputation of the first through fifth proximal metatarsals.   Pathology:  A. RIGHT FOOT, TRANSMETATARSAL AMPUTATION:  Dry gangrene/coagulative necrosis  Bone margin negative for osteomyelitis   Micro: None  Assessment:   1. Gangrene of toe of right foot (HCC)   2. Critical limb ischemia of both lower extremities (HCC)   3. Post-operative state     Gangrene of right forefoot s/p Transmetatarsal amputation  Plan:  Patient was evaluated and treated and all questions answered.  8 week s/p R foot TMA with graft application. -Progressing as  expected post operatively.  Amputation site continues to heal aside from the small area at the lateral aspect which is superficial and seems to be improving with wound care - Recommend we proceed with mupirocin  ointment applied daily to the small wound on the lateral aspect of the incision and cover with adhesive bandage. -XR: expected post op changes -WB Status: Weightbearing in postop shoe -did discuss that if patient feels the postop shoe is causing rubbing or irritation in the amputation site she can switch to a soft shoe/slipper a preferred -Sutures: Previously removed -Medications/ABX: No further antibiotics indicated -Dressing: As above daily mupirocin  ointment and dry gauze dressing or border foam dressing/adhesive bandage - F/u Plan: Follow-up in 3 weeks        [name], DPM [facility]

## 2023-10-24 ENCOUNTER — Ambulatory Visit: Admitting: Internal Medicine

## 2023-10-31 ENCOUNTER — Encounter (HOSPITAL_COMMUNITY): Payer: Self-pay

## 2023-10-31 ENCOUNTER — Ambulatory Visit (HOSPITAL_COMMUNITY): Attending: Internal Medicine | Admitting: Internal Medicine

## 2023-10-31 NOTE — Progress Notes (Incomplete)
 Primary Care Physician: Vicci Barnie NOVAK, MD Primary Cardiologist: Dr Alveta Primary Electrophysiologist: Dr Inocencio Meeker [facility]: Dr Gardenia Referring Physician: Aleene Jernigan PA   Joanna [name] is a 63 y.o. female with a history of HTN, combined chronic systolic and diastolic CHF, tobacco abuse, MR, and persistent atrial fibrillation who presents for follow up in the [facility].  The patient was initially diagnosed with atrial fibrillation on 03/08/18 at the ER after presenting with symptoms of dyspnea on exertion for one week prior. ECG at ER showed afib with RVR and she was given Lopressor  and started on Cardizem  drip and heparin . She underwent successful DCCV. Echo on admission showed severely reduced EF 20-25%, possibly tachycardia mediated. Discharged on Eliquis  and Lopressor . On follow up, she was noted to be in afib again with RVR. Started on amiodarone  which did well keeping her in SR. Unfortunately, she developed amiodarone  induced hyperthyroidism and this was discontinued 10/16/21.  Patient was hospitalized 12/20/21 with acute CHF in the setting of rapid Afib. Echo showed EF back down again, 25-30% w/ global HK, LV mod-severely dilated, LA severely dilated w/ eccentric MR, posteriorly directed into LA, severe regurgitation. She was diuresed w/ IV Lasix  and transitioned to PO with additions of Farxiga  and spironolactone . Treated w/ Coreg  and digoxin  for rate control.   On follow up today, patient is s/p dofetilide  admission 12/20-12/23/23. She converted with the medication and did not require DCCV. She had a post termination pause lasting 4.2 seconds, digoxin  was discontinued and carvedilol  decreased. Patient remains in SR today and is feeling well. She walked into the clinic instead of using the wheelchair.   On follow up 07/16/22, she is currently in NSR. S/p Tikosyn  load 12/20-23/23. Seen by Charlies Arthur 04/17/22 with stable Qtc and maintaining SR. She has  not noted any episodes of Afib since OV with [name]. She complains of right leg pain ongoing which is not new; she uses a cane for ambulation. She needs a refill of Tikosyn  as she took her last dose this morning. No missed doses of anticoagulation.  On follow up 10/29/22, she is currently in NSR. She has had no episodes of Afib since last office visit. She feels well overall except she just received an injection to help with her right leg pain. No missed doses of Tikosyn . No bleeding issues on Eliquis .   On follow up 05/02/23, she is here for Tikosyn  surveillance. She is currently in NSR. She has had no episodes of Afib since last office visit. No missed doses of Tikosyn  or Eliquis . She is in a lot of pain related to her gangrene of right foot.   On follow up 10/31/23, patient is here for Tikosyn  surveillance. She has had overall *** Afib burden since last office visit. No missed doses of Tikosyn  or Eliquis .   Today, she denies symptoms of palpitations, chest pain, orthopnea, PND, lower extremity edema, dizziness, presyncope, syncope, snoring, daytime somnolence, bleeding, or neurologic sequela. The patient is tolerating medications without difficulties and is otherwise without complaint today.    Atrial Fibrillation Risk Factors:  she does not have symptoms or diagnosis of sleep apnea. she does not have a history of rheumatic fever. she does not have a history of alcohol use. The patient does not have a history of early familial atrial fibrillation or other arrhythmias.  she has a BMI of There is no height or weight on file to calculate BMI.. There were no vitals filed for this visit.  Family History  Problem Relation Age of Onset   Cancer Mother    Heart disease Father     Atrial Fibrillation Management history:  Previous antiarrhythmic drugs: amiodarone , dofetilide   Previous cardioversions: 03/14/2018 Previous ablations: none CHADS2VASC score: 3  Anticoagulation history:  Eliquis    Past Medical History:  Diagnosis Date   Atrial fibrillation with RVR (HCC)    Chronic combined systolic (congestive) and diastolic (congestive) heart failure (HCC)    Dyspnea    Goiter    Hypertension    NICM (nonischemic cardiomyopathy) (HCC)    a. 08/2005 Echo: EF 30-35%, mod diff HK. Mild to Mod MR. Mildly dil LA; b. 08/2005 Cath: Nl Cors. Elevated CO w/o shunt; c. 09/2007 Echo: EF 45%. Mild to mod MR; c. 03/2015 Echo: EF 45%, global HK. Gr1 DD. Mild MR. Mildly dil LA.   Past Surgical History:  Procedure Laterality Date   ABDOMINAL AORTOGRAM N/A 08/19/2023   Procedure: ABDOMINAL AORTOGRAM;  Surgeon: Sheree Penne Bruckner, MD;  Location: [facility] INVASIVE CV LAB;  Service: Cardiovascular;  Laterality: N/A;   ABDOMINAL AORTOGRAM W/LOWER EXTREMITY N/A 03/07/2023   Procedure: ABDOMINAL AORTOGRAM W/LOWER EXTREMITY;  Surgeon: Gretta Bruckner PARAS, MD;  Location: [facility] INVASIVE CV LAB;  Service: Cardiovascular;  Laterality: N/A;   ABDOMINAL AORTOGRAM W/LOWER EXTREMITY N/A 03/27/2023   Procedure: ABDOMINAL AORTOGRAM W/LOWER EXTREMITY;  Surgeon: Lanis Fonda BRAVO, MD;  Location: [facility] INVASIVE CV LAB;  Service: Cardiovascular;  Laterality: N/A;   APPENDECTOMY     BACK SURGERY     CARDIOVERSION N/A 03/14/2018   Procedure: CARDIOVERSION;  Surgeon: Okey Vina GAILS, MD;  Location: [facility] ENDOSCOPY;  Service: Cardiovascular;  Laterality: N/A;   LOWER EXTREMITY ANGIOGRAPHY Right 08/19/2023   Procedure: Lower Extremity Angiography;  Surgeon: Sheree Penne Bruckner, MD;  Location: [facility] INVASIVE CV LAB;  Service: Cardiovascular;  Laterality: Right;   LOWER EXTREMITY INTERVENTION Right 08/19/2023   Procedure: LOWER EXTREMITY INTERVENTION;  Surgeon: Sheree Penne Bruckner, MD;  Location: [facility] INVASIVE CV LAB;  Service: Cardiovascular;  Laterality: Right;   PERIPHERAL VASCULAR BALLOON ANGIOPLASTY  03/27/2023   Procedure: PERIPHERAL VASCULAR BALLOON ANGIOPLASTY;  Surgeon: Lanis Fonda BRAVO, MD;  Location: [facility] INVASIVE CV  LAB;  Service: Cardiovascular;;  Right AT   TEE WITHOUT CARDIOVERSION N/A 03/14/2018   Procedure: TRANSESOPHAGEAL ECHOCARDIOGRAM (TEE);  Surgeon: Okey Vina GAILS, MD;  Location: [facility] ENDOSCOPY;  Service: Cardiovascular;  Laterality: N/A;   TEE WITHOUT CARDIOVERSION N/A 02/26/2022   Procedure: TRANSESOPHAGEAL ECHOCARDIOGRAM (TEE);  Surgeon: Gardenia Led, DO;  Location: [facility] ENDOSCOPY;  Service: Cardiovascular;  Laterality: N/A;   TRANSMETATARSAL AMPUTATION Right 08/21/2023   Procedure: AMPUTATION, FOOT, TRANSMETATARSAL;  Surgeon: Malvin Marsa FALCON, DPM;  Location: [facility] OR;  Service: Orthopedics/Podiatry;  Laterality: Right;  Right foot TMA    Current Outpatient Medications  Medication Sig Dispense Refill   atorvastatin  (LIPITOR) 40 MG tablet Take 1 tablet (40 mg total) by mouth daily. 90 tablet 2   carvedilol  (COREG ) 6.25 MG tablet TAKE 1 TABLET(6.25 MG) BY MOUTH TWICE DAILY 180 tablet 0   clopidogrel  (PLAVIX ) 75 MG tablet Take 1 tablet (75 mg total) by mouth daily. 90 tablet 4   cyclobenzaprine  (FLEXERIL ) 5 MG tablet Take 1 tablet (5 mg total) by mouth 3 (three) times daily as needed for muscle spasms. 30 tablet 1   dofetilide  (TIKOSYN ) 125 MCG capsule Take 1 capsule (125 mcg total) by mouth 2 (two) times daily. 60 capsule 3   ELIQUIS  5 MG TABS tablet TAKE 1 TABLET BY MOUTH TWICE  DAILY  200 tablet 2   feeding supplement (ENSURE IMMUNE HEALTH) LIQD Take 237 mLs by mouth 2 (two) times daily. If she has any     furosemide  (LASIX ) 40 MG tablet Take 40 mg by mouth daily.     methimazole  (TAPAZOLE ) 5 MG tablet Take 1 tablet (5 mg total) by mouth daily. 90 tablet 3   mupirocin  ointment (BACTROBAN ) 2 % Apply 1 Application topically daily. 22 g 0   potassium chloride  (KLOR-CON ) 10 MEQ tablet TAKE 1 TABLET(10 MEQ) BY MOUTH DAILY 90 tablet 3   spironolactone  (ALDACTONE ) 25 MG tablet Take 0.5 tablets (12.5 mg total) by mouth daily. 45 tablet 3   No current facility-administered medications for this visit.     No Known Allergies  ROS- All systems are reviewed and negative except as per the HPI above.  Physical Exam: There were no vitals filed for this visit.  GEN- The patient is well appearing, alert and oriented x 3 today.   Neck - no JVD or carotid bruit noted Lungs- Clear to ausculation bilaterally, normal work of breathing Heart- ***Regular rate and rhythm, no murmurs, rubs or gallops, PMI not laterally displaced Extremities- no clubbing, cyanosis, or edema Skin - no rash or ecchymosis noted   Wt Readings from Last 3 Encounters:  08/21/23 (P) 62.1 kg  08/15/23 63.5 kg  07/17/23 60.7 kg    EKG today demonstrates ***  Echo 02/26/22 (TEE) demonstrated  1. Left ventricular ejection fraction, by estimation, is 35 to 40%. The  left ventricle has mild to moderately decreased function. The left  ventricle has no regional wall motion abnormalities. Left ventricular  diastolic function could not be evaluated.   2. Right ventricular systolic function is normal. The right ventricular  size is normal.   3. Left atrial size was mild to moderately dilated. No left atrial/left  atrial appendage thrombus was detected.   4. The mitral valve is normal in structure. Mild to moderate mitral valve  regurgitation. No evidence of mitral stenosis.   5. The aortic valve is normal in structure. Aortic valve regurgitation is  not visualized. No aortic stenosis is present.   6. The inferior vena cava is normal in size with greater than 50%  respiratory variability, suggesting right atrial pressure of 3 mmHg.    Epic records are reviewed at length today   CHA2DS2-VASc Score = 3  The patient's score is based upon: CHF History: 1 HTN History: 1 Diabetes History: 0 Stroke History: 0 Vascular Disease History: 0 Age Score: 0 Gender Score: 1       ASSESSMENT AND PLAN: 1. Persistent Atrial Fibrillation (ICD10:  I48.19) The patient's CHA2DS2-VASc score is 3, indicating a 3.2% annual risk of  stroke.   Patient off amiodarone  due to hyperthyroidism, on methimazole .  S/p dofetilide  admission 12/20-12/23/23.  Patient is currently in ***.   High risk medication monitoring (ICD10: J342684) Patient requires ongoing monitoring for anti-arrhythmic medication which has the potential to cause life threatening arrhythmias or AV block. Qtc stable. Continue Tikosyn  125 mcg BID. Bmet and mag from 7/18 stable.  2. Secondary Hypercoagulable State (ICD10:  D68.69) The patient is at significant risk for stroke/thromboembolism based upon her CHA2DS2-VASc Score of 3.  Continue Apixaban  (Eliquis ).  Continue Eliquis .  3. Chronic systolic CHF Suspected tachycardia mediated.  GDMT per [facility], followed by Dr Gardenia. Appears euvolemic today.    Follow up in 6 months Afib clinic for Tikosyn  surveillance.   Fairy Heinrich, PA-C [facility] [address] [place], [place] [address] [phone] 10/31/2023 10:29 AM

## 2023-11-06 ENCOUNTER — Ambulatory Visit (HOSPITAL_COMMUNITY)
Admission: RE | Admit: 2023-11-06 | Discharge: 2023-11-06 | Disposition: A | Discharge status: Home / Self Care | Source: Ambulatory Visit | Attending: Vascular Surgery | Admitting: Vascular Surgery

## 2023-11-06 ENCOUNTER — Ambulatory Visit (HOSPITAL_BASED_OUTPATIENT_CLINIC_OR_DEPARTMENT_OTHER)
Admission: RE | Admit: 2023-11-06 | Discharge: 2023-11-06 | Disposition: A | Discharge status: Home / Self Care | Source: Ambulatory Visit | Attending: Vascular Surgery | Admitting: Vascular Surgery

## 2023-11-06 ENCOUNTER — Ambulatory Visit

## 2023-11-06 VITALS — BP 100/70 | HR 56 | Temp 98.3°F | Resp 20 | Ht 69.0 in | Wt 137.0 lb

## 2023-11-06 DIAGNOSIS — I739 Peripheral vascular disease, unspecified: Secondary | ICD-10-CM | POA: Diagnosis present

## 2023-11-06 DIAGNOSIS — I70238 Atherosclerosis of native arteries of right leg with ulceration of other part of lower right leg: Secondary | ICD-10-CM | POA: Diagnosis present

## 2023-11-06 LAB — VAS US ABI WITH/WO TBI
Left ABI: 0.99
Right ABI: 0.81

## 2023-11-06 NOTE — Progress Notes (Signed)
 Office Note     CC:  follow up Requesting Provider:  Vicci Barnie NOVAK, MD  HPI: Joanna [name] is a 63 y.o. (1960/07/11) female who presents status post aortogram with stenting of the right posterior tibial artery and TP trunk as well as right SFA stent by Dr. Sheree on 08/19/2023.  2 days later she underwent transmetatarsal amputation by Dr. Malvin.  Surgical history also significant for balloon angioplasty of the right TPT, PTA on 03/27/2023 by Dr. Lanis for blue toe syndrome.  She underwent angiogram with right EIA angioplasty and stent on 03/07/2023 for CLI with rest pain by Dr. Gretta.  She believes her TMA is nearly healed.  She is cleansing the incision daily and applying Betadine.  She is on Eliquis  and Plavix  as well as a statin daily.  She is seen today in a wheelchair.  She is accompanied by her sister.   Past Medical History:  Diagnosis Date   Atrial fibrillation with RVR (HCC)    Chronic combined systolic (congestive) and diastolic (congestive) heart failure (HCC)    Dyspnea    Goiter    Hypertension    NICM (nonischemic cardiomyopathy) (HCC)    a. 08/2005 Echo: EF 30-35%, mod diff HK. Mild to Mod MR. Mildly dil LA; b. 08/2005 Cath: Nl Cors. Elevated CO w/o shunt; c. 09/2007 Echo: EF 45%. Mild to mod MR; c. 03/2015 Echo: EF 45%, global HK. Gr1 DD. Mild MR. Mildly dil LA.    Past Surgical History:  Procedure Laterality Date   ABDOMINAL AORTOGRAM N/A 08/19/2023   Procedure: ABDOMINAL AORTOGRAM;  Surgeon: Sheree Penne Bruckner, MD;  Location: [name] INVASIVE CV LAB;  Service: Cardiovascular;  Laterality: N/A;   ABDOMINAL AORTOGRAM W/LOWER EXTREMITY N/A 03/07/2023   Procedure: ABDOMINAL AORTOGRAM W/LOWER EXTREMITY;  Surgeon: Gretta Bruckner PARAS, MD;  Location: [facility] INVASIVE CV LAB;  Service: Cardiovascular;  Laterality: N/A;   ABDOMINAL AORTOGRAM W/LOWER EXTREMITY N/A 03/27/2023   Procedure: ABDOMINAL AORTOGRAM W/LOWER EXTREMITY;  Surgeon: Lanis Fonda BRAVO, MD;  Location: [facility] INVASIVE  CV LAB;  Service: Cardiovascular;  Laterality: N/A;   APPENDECTOMY     BACK SURGERY     CARDIOVERSION N/A 03/14/2018   Procedure: CARDIOVERSION;  Surgeon: Okey Vina GAILS, MD;  Location: [facility] ENDOSCOPY;  Service: Cardiovascular;  Laterality: N/A;   LOWER EXTREMITY ANGIOGRAPHY Right 08/19/2023   Procedure: Lower Extremity Angiography;  Surgeon: Sheree Penne Bruckner, MD;  Location: [facility] INVASIVE CV LAB;  Service: Cardiovascular;  Laterality: Right;   LOWER EXTREMITY INTERVENTION Right 08/19/2023   Procedure: LOWER EXTREMITY INTERVENTION;  Surgeon: Sheree Penne Bruckner, MD;  Location: [facility] INVASIVE CV LAB;  Service: Cardiovascular;  Laterality: Right;   PERIPHERAL VASCULAR BALLOON ANGIOPLASTY  03/27/2023   Procedure: PERIPHERAL VASCULAR BALLOON ANGIOPLASTY;  Surgeon: Lanis Fonda BRAVO, MD;  Location: [facility] INVASIVE CV LAB;  Service: Cardiovascular;;  Right AT   TEE WITHOUT CARDIOVERSION N/A 03/14/2018   Procedure: TRANSESOPHAGEAL ECHOCARDIOGRAM (TEE);  Surgeon: Okey Vina GAILS, MD;  Location: [facility] ENDOSCOPY;  Service: Cardiovascular;  Laterality: N/A;   TEE WITHOUT CARDIOVERSION N/A 02/26/2022   Procedure: TRANSESOPHAGEAL ECHOCARDIOGRAM (TEE);  Surgeon: Gardenia Led, DO;  Location: [facility] ENDOSCOPY;  Service: Cardiovascular;  Laterality: N/A;   TRANSMETATARSAL AMPUTATION Right 08/21/2023   Procedure: AMPUTATION, FOOT, TRANSMETATARSAL;  Surgeon: Malvin Marsa FALCON, DPM;  Location: [facility] OR;  Service: Orthopedics/Podiatry;  Laterality: Right;  Right foot TMA    Social History   Socioeconomic History   Marital status: Single    Spouse name: Not on file  Number of children: 1   Years of education: Not on file   Highest education level: High school graduate  Occupational History   Occupation: unemployed    Comment: on disablity  Tobacco Use   Smoking status: Every Day    Current packs/day: 0.00    Average packs/day: 2.0 packs/day for 30.0 years (60.0 ttl pk-yrs)    Types: Cigarettes    Start date:  02/09/1988    Last attempt to quit: 02/08/2018    Years since quitting: 5.7   Smokeless tobacco: Never   Tobacco comments:    3 cigarettes daily 02/09/22  Vaping Use   Vaping status: Never Used  Substance and Sexual Activity   Alcohol use: No    Comment: quit 2 years ago   Drug use: No   Sexual activity: Not Currently  Other Topics Concern   Not on file  Social History Narrative   Not on file   Social Drivers of Health   Financial Resource Strain: Low Risk  (07/17/2022)   Overall Financial Resource Strain (CARDIA)    Difficulty of Paying Living Expenses: Not hard at all  Food Insecurity: No Food Insecurity (08/26/2023)   Hunger Vital Sign    Worried About Running Out of Food in the Last Year: Never true    Ran Out of Food in the Last Year: Never true  Transportation Needs: No Transportation Needs (08/26/2023)   PRAPARE - Administrator, Civil Service (Medical): No    Lack of Transportation (Non-Medical): No  Physical Activity: Insufficiently Active (07/17/2022)   Exercise Vital Sign    Days of Exercise per Week: 3 days    Minutes of Exercise per Session: 30 min  Stress: No Stress Concern Present (07/17/2022)   [employer] of Occupational Health - Occupational Stress Questionnaire    Feeling of Stress : Not at all  Social Connections: Moderately Isolated (03/04/2023)   Social Connection and Isolation Panel    Frequency of Communication with Friends and Family: More than three times a week    Frequency of Social Gatherings with Friends and Family: Three times a week    Attends Religious Services: More than 4 times per year    Active Member of Clubs or Organizations: No    Attends Banker Meetings: Never    Marital Status: Never married  Intimate Partner Violence: Not At Risk (08/26/2023)   Humiliation, Afraid, Rape, and Kick questionnaire    Fear of Current or Ex-Partner: No    Emotionally Abused: No    Physically Abused: No    Sexually Abused: No     Family History  Problem Relation Age of Onset   Cancer Mother    Heart disease Father     Current Outpatient Medications  Medication Sig Dispense Refill   atorvastatin  (LIPITOR) 40 MG tablet Take 1 tablet (40 mg total) by mouth daily. 90 tablet 2   carvedilol  (COREG ) 6.25 MG tablet TAKE 1 TABLET(6.25 MG) BY MOUTH TWICE DAILY 180 tablet 0   clopidogrel  (PLAVIX ) 75 MG tablet Take 1 tablet (75 mg total) by mouth daily. 90 tablet 4   cyclobenzaprine  (FLEXERIL ) 5 MG tablet Take 1 tablet (5 mg total) by mouth 3 (three) times daily as needed for muscle spasms. 30 tablet 1   dofetilide  (TIKOSYN ) 125 MCG capsule Take 1 capsule (125 mcg total) by mouth 2 (two) times daily. 60 capsule 3   ELIQUIS  5 MG TABS tablet TAKE 1 TABLET BY MOUTH TWICE  DAILY  200 tablet 2   feeding supplement (ENSURE IMMUNE HEALTH) LIQD Take 237 mLs by mouth 2 (two) times daily. If she has any     furosemide  (LASIX ) 40 MG tablet Take 40 mg by mouth daily.     methimazole  (TAPAZOLE ) 5 MG tablet Take 1 tablet (5 mg total) by mouth daily. 90 tablet 3   mupirocin  ointment (BACTROBAN ) 2 % Apply 1 Application topically daily. 22 g 0   potassium chloride  (KLOR-CON ) 10 MEQ tablet TAKE 1 TABLET(10 MEQ) BY MOUTH DAILY 90 tablet 3   spironolactone  (ALDACTONE ) 25 MG tablet Take 0.5 tablets (12.5 mg total) by mouth daily. 45 tablet 3   No current facility-administered medications for this visit.    No Known Allergies   REVIEW OF SYSTEMS:  Negative unless noted in HPI [X]  denotes positive finding, [ ]  denotes negative finding Cardiac  Comments:  Chest pain or chest pressure:    Shortness of breath upon exertion:    Short of breath when lying flat:    Irregular heart rhythm:        Vascular    Pain in calf, thigh, or hip brought on by ambulation:    Pain in feet at night that wakes you up from your sleep:     Blood clot in your veins:    Leg swelling:         Pulmonary    Oxygen at home:    Productive cough:      Wheezing:         Neurologic    Sudden weakness in arms or legs:     Sudden numbness in arms or legs:     Sudden onset of difficulty speaking or slurred speech:    Temporary loss of vision in one eye:     Problems with dizziness:         Gastrointestinal    Blood in stool:     Vomited blood:         Genitourinary    Burning when urinating:     Blood in urine:        Psychiatric    Major depression:         Hematologic    Bleeding problems:    Problems with blood clotting too easily:        Skin    Rashes or ulcers:        Constitutional    Fever or chills:      PHYSICAL EXAMINATION:  Vitals:   11/06/23 1403  BP: 100/70  Pulse: (!) 56  Resp: 20  Temp: 98.3 F (36.8 C)  TempSrc: Temporal  SpO2: 100%  Weight: 137 lb (62.1 kg)  Height: 5' 9 (1.753 m)    General:  WDWN in NAD; vital signs documented above Gait: Not observed HENT: WNL, normocephalic Pulmonary: normal non-labored breathing Cardiac: regular HR Abdomen: soft, NT, no masses Skin: without rashes Vascular Exam/Pulses: Right peroneal and AT signal by Doppler at the ankle Extremities: TMA pictured below; lateral incision slow to heal Musculoskeletal: no muscle wasting or atrophy  Neurologic: A&O X 3 Psychiatric:  The pt has Normal affect.    Non-Invasive Vascular Imaging:   Right SFA stent patent  PTA occluded proximally  ABI/TBIToday's ABIToday's TBIPrevious ABIPrevious TBI  +-------+-----------+-----------+------------+------------+  Right 0.81       amp        0.94                      +-------+-----------+-----------+------------+------------+  Left  0.99       0          1.25        0.61          +-------+-----------+-----------+------------+------------     ASSESSMENT/PLAN:: 63 y.o. female status post SFA, TP trunk, and PT stenting.  She subsequently underwent TMA by podiatry  Ms. Boardley is well-known to [facility] with numerous endovascular interventions.  Most  recently she underwent right SFA, TP trunk, and PT stenting.  This was followed by TMA performed by podiatry.  TMA has nearly healed.  There is a small area in the lateral incision which is slow to heal.  Duplex demonstrates a widely patent SFA stent.  Unfortunately her PT and is now occluded.  Given that the TMA seems to be nearly healed we will continue current wound care and recheck the wound in several weeks.  If healing stalls or the incision worsens she will require repeat angiography.   Donnice Sender, PA-C [facility] [phone]  Clinic MD:   Sheree

## 2023-11-12 ENCOUNTER — Encounter: Payer: Self-pay | Admitting: Podiatry

## 2023-11-12 ENCOUNTER — Ambulatory Visit: Admitting: Podiatry

## 2023-11-12 ENCOUNTER — Other Ambulatory Visit (HOSPITAL_COMMUNITY): Payer: Self-pay | Admitting: Internal Medicine

## 2023-11-12 DIAGNOSIS — Z9889 Other specified postprocedural states: Secondary | ICD-10-CM

## 2023-11-12 DIAGNOSIS — I70223 Atherosclerosis of native arteries of extremities with rest pain, bilateral legs: Secondary | ICD-10-CM

## 2023-11-12 DIAGNOSIS — I96 Gangrene, not elsewhere classified: Secondary | ICD-10-CM

## 2023-11-12 NOTE — Progress Notes (Signed)
  Subjective:  Patient ID: Joanna [name], female    DOB: Apr 13, 1960,  MRN: [id number]   DOS: 08/21/23 Procedure: 1. Transmetatarsal amputation of right foot 2. Application of amniotic graft 5x5 cm, right foot  63 y.o. female seen for post op check.   Patient is approaching 3 months out from the procedure.  Has been doing local wound care for small ulceration of the lateral aspect.  Reports she has been doing well she has been walking and slipper.  Review of Systems: Negative except as noted in the HPI. Denies N/V/F/Ch.   Objective:   Constitutional Well developed. Well nourished.  Vascular Foot warm and well perfused. Capillary refill normal to all digits.   No calf pain with palpation  Neurologic Normal speech. Oriented to person, place, and time. Epicritic sensation intact  Dermatologic Amputation site nearly fully healed laterally with very small superficial ulceration probing to subcutaneous fat tissue layer.  No evidence of infection healing very nicely improved from prior   Orthopedic: S/p R foot TMA   Radiographs: Interval amputation of the first through fifth proximal metatarsals.   Pathology:  A. RIGHT FOOT, TRANSMETATARSAL AMPUTATION:  Dry gangrene/coagulative necrosis  Bone margin negative for osteomyelitis   Micro: None  Assessment:   1. Gangrene of toe of right foot (HCC)   2. Critical limb ischemia of both lower extremities (HCC)   3. Post-operative state      Gangrene of right forefoot s/p Transmetatarsal amputation  Plan:  Patient was evaluated and treated and all questions answered.  11 week s/p R foot TMA with graft application. -Progressing as expected post operatively.  Nearly fully healed at the lateral aspect of the amputation site, very superficial ulceration still remains - Recommend we continue with mupirocin  ointment applied daily to the small wound on the lateral aspect of the incision and cover with adhesive bandage. -XR: expected post op  changes -WB Status: Weightbearing in slipper -Sutures: Previously removed -Medications/ABX: No further antibiotics indicated -Dressing: As above daily mupirocin  ointment and dry gauze dressing or border foam dressing/adhesive bandage - F/u Plan: Follow-up in 1 month        Joanna [name], DPM [facility]

## 2023-11-20 ENCOUNTER — Ambulatory Visit: Attending: Internal Medicine

## 2023-11-29 ENCOUNTER — Encounter (HOSPITAL_COMMUNITY): Admitting: Cardiology

## 2023-12-03 ENCOUNTER — Other Ambulatory Visit: Payer: Self-pay

## 2023-12-03 DIAGNOSIS — I4819 Other persistent atrial fibrillation: Secondary | ICD-10-CM

## 2023-12-03 MED ORDER — APIXABAN 5 MG PO TABS: 5.0000 mg | ORAL_TABLET | Freq: Two times a day (BID) | ORAL | 2 refills | Status: AC

## 2023-12-03 NOTE — Telephone Encounter (Signed)
 Prescription refill request for Eliquis  received. Indication:afib Last office visit:6/25 Scr:0.71  7/25 Age: 63 Weight:62.1  kg  Prescription refilled

## 2023-12-09 ENCOUNTER — Telehealth (HOSPITAL_COMMUNITY): Payer: Self-pay | Admitting: Cardiology

## 2023-12-09 NOTE — Telephone Encounter (Signed)
 Called to confirm/remind patient of their appointment at the [facility] on 12/09/23.   Appointment:   [] Confirmed  [] Left mess   [x] No answer/No voice mail  [] VM Full/unable to leave message  [] Phone not in service  Patient reminded to bring all medications and/or complete list.  Confirmed patient has transportation. Gave directions, instructed to utilize valet parking.

## 2023-12-10 ENCOUNTER — Ambulatory Visit (INDEPENDENT_AMBULATORY_CARE_PROVIDER_SITE_OTHER): Admitting: Podiatry

## 2023-12-10 ENCOUNTER — Encounter: Payer: Self-pay | Admitting: Podiatry

## 2023-12-10 ENCOUNTER — Encounter (HOSPITAL_COMMUNITY): Payer: Self-pay | Admitting: Cardiology

## 2023-12-10 ENCOUNTER — Ambulatory Visit (HOSPITAL_COMMUNITY): Payer: Self-pay | Admitting: Cardiology

## 2023-12-10 ENCOUNTER — Ambulatory Visit (HOSPITAL_COMMUNITY)
Admission: RE | Admit: 2023-12-10 | Discharge: 2023-12-10 | Disposition: A | Discharge status: Home / Self Care | Source: Ambulatory Visit | Attending: Cardiology | Admitting: Cardiology

## 2023-12-10 ENCOUNTER — Other Ambulatory Visit (HOSPITAL_COMMUNITY): Payer: Self-pay

## 2023-12-10 VITALS — BP 128/80 | HR 67 | Wt 130.0 lb

## 2023-12-10 DIAGNOSIS — I110 Hypertensive heart disease with heart failure: Secondary | ICD-10-CM | POA: Diagnosis not present

## 2023-12-10 DIAGNOSIS — Z7984 Long term (current) use of oral hypoglycemic drugs: Secondary | ICD-10-CM | POA: Diagnosis not present

## 2023-12-10 DIAGNOSIS — I480 Paroxysmal atrial fibrillation: Secondary | ICD-10-CM | POA: Insufficient documentation

## 2023-12-10 DIAGNOSIS — Z9889 Other specified postprocedural states: Secondary | ICD-10-CM | POA: Diagnosis not present

## 2023-12-10 DIAGNOSIS — I340 Nonrheumatic mitral (valve) insufficiency: Secondary | ICD-10-CM | POA: Insufficient documentation

## 2023-12-10 DIAGNOSIS — I5042 Chronic combined systolic (congestive) and diastolic (congestive) heart failure: Secondary | ICD-10-CM | POA: Diagnosis present

## 2023-12-10 DIAGNOSIS — Z7901 Long term (current) use of anticoagulants: Secondary | ICD-10-CM | POA: Insufficient documentation

## 2023-12-10 DIAGNOSIS — I739 Peripheral vascular disease, unspecified: Secondary | ICD-10-CM | POA: Diagnosis not present

## 2023-12-10 DIAGNOSIS — E0590 Thyrotoxicosis, unspecified without thyrotoxic crisis or storm: Secondary | ICD-10-CM | POA: Insufficient documentation

## 2023-12-10 DIAGNOSIS — I96 Gangrene, not elsewhere classified: Secondary | ICD-10-CM | POA: Diagnosis not present

## 2023-12-10 DIAGNOSIS — Z79899 Other long term (current) drug therapy: Secondary | ICD-10-CM | POA: Diagnosis not present

## 2023-12-10 DIAGNOSIS — R0602 Shortness of breath: Secondary | ICD-10-CM | POA: Diagnosis not present

## 2023-12-10 DIAGNOSIS — I70223 Atherosclerosis of native arteries of extremities with rest pain, bilateral legs: Secondary | ICD-10-CM

## 2023-12-10 DIAGNOSIS — Z89431 Acquired absence of right foot: Secondary | ICD-10-CM | POA: Insufficient documentation

## 2023-12-10 DIAGNOSIS — Z7902 Long term (current) use of antithrombotics/antiplatelets: Secondary | ICD-10-CM | POA: Diagnosis not present

## 2023-12-10 DIAGNOSIS — F17210 Nicotine dependence, cigarettes, uncomplicated: Secondary | ICD-10-CM | POA: Insufficient documentation

## 2023-12-10 DIAGNOSIS — I428 Other cardiomyopathies: Secondary | ICD-10-CM | POA: Insufficient documentation

## 2023-12-10 DIAGNOSIS — I5022 Chronic systolic (congestive) heart failure: Secondary | ICD-10-CM | POA: Insufficient documentation

## 2023-12-10 LAB — LIPID PANEL
Cholesterol: 84 mg/dL (ref 0–200)
HDL: 36 mg/dL — ABNORMAL LOW (ref >40)
LDL Cholesterol: 38 mg/dL (ref 0–99)
Total CHOL/HDL Ratio: 2.3 ratio
Triglycerides: 49 mg/dL (ref <150)
VLDL: 10 mg/dL (ref 0–40)

## 2023-12-10 LAB — CBC
HCT: 40.6 % (ref 36.0–46.0)
Hemoglobin: 12.2 g/dL (ref 12.0–15.0)
MCH: 27.4 pg (ref 26.0–34.0)
MCHC: 30 g/dL (ref 30.0–36.0)
MCV: 91.2 fL (ref 80.0–100.0)
Platelets: 432 K/uL — ABNORMAL HIGH (ref 150–400)
RBC: 4.45 MIL/uL (ref 3.87–5.11)
RDW: 12.6 % (ref 11.5–15.5)
WBC: 8.1 K/uL (ref 4.0–10.5)
nRBC: 0 % (ref 0.0–0.2)

## 2023-12-10 LAB — BASIC METABOLIC PANEL WITH GFR
Anion gap: 9 (ref 5–15)
BUN: 25 mg/dL — ABNORMAL HIGH (ref 8–23)
CO2: 21 mmol/L — ABNORMAL LOW (ref 22–32)
Calcium: 9.4 mg/dL (ref 8.9–10.3)
Chloride: 107 mmol/L (ref 98–111)
Creatinine, Ser: 0.96 mg/dL (ref 0.44–1.00)
GFR, Estimated: >60 mL/min (ref >60)
Glucose, Bld: 88 mg/dL (ref 70–99)
Potassium: 4.9 mmol/L (ref 3.5–5.1)
Sodium: 137 mmol/L (ref 135–145)

## 2023-12-10 LAB — BRAIN NATRIURETIC PEPTIDE: B Natriuretic Peptide: 64.9 pg/mL (ref 0.0–100.0)

## 2023-12-10 MED ORDER — FARXIGA 10 MG PO TABS
10.0000 mg | ORAL_TABLET | Freq: Every day | ORAL | 3 refills | Status: AC
Start: 2023-12-10 — End: ?

## 2023-12-10 MED ORDER — ENTRESTO 24-26 MG PO TABS: 1.0000 | ORAL_TABLET | Freq: Two times a day (BID) | ORAL | 11 refills | Status: DC

## 2023-12-10 MED ORDER — CYCLOBENZAPRINE HCL 5 MG PO TABS: 5.0000 mg | ORAL_TABLET | Freq: Three times a day (TID) | ORAL | 1 refills | Status: DC | PRN

## 2023-12-10 MED ORDER — GABAPENTIN 300 MG PO CAPS: 300.0000 mg | ORAL_CAPSULE | Freq: Two times a day (BID) | ORAL | 3 refills | Status: DC

## 2023-12-10 NOTE — Patient Instructions (Signed)
 START Entresto  24/26 mg Twice daily  START Farxiga  10 mg daily.  Labs done today, your results will be available in MyChart, we will contact you for abnormal readings.  REPEAT blood work in 10 days.  Your physician has requested that you have a cardiac MRI. Cardiac MRI uses a computer to create images of your heart as its beating, producing both still and moving pictures of your heart and major blood vessels. For further information please visit [email or web address]. Please follow the instruction sheet given to you today for more information.  Please follow up with our heart failure pharmacist in 3 weeks.  Your physician recommends that you schedule a follow-up appointment in: 6 weeks.  If you have any questions or concerns before your next appointment please send us  a message through [place] or call our office at [phone].    TO LEAVE A MESSAGE FOR THE NURSE SELECT OPTION 2, PLEASE LEAVE A MESSAGE INCLUDING: YOUR NAME DATE OF BIRTH CALL BACK NUMBER REASON FOR CALL**this is important as we prioritize the call backs  YOU WILL RECEIVE A CALL BACK THE SAME DAY AS LONG AS YOU CALL BEFORE 4:00 PM  At the [facility], you and your health needs are our priority. As part of our continuing mission to provide you with exceptional heart care, we have created designated Provider Care Teams. These Care Teams include your primary Cardiologist (physician) and Advanced Practice Providers (APPs- Physician Assistants and Nurse Practitioners) who all work together to provide you with the care you need, when you need it.   You may see any of the following providers on your designated Care Team at your next follow up: Dr Toribio Fuel Dr Ezra Shuck Dr. Morene Brownie Greig Mosses, NP Caffie Shed, [place] [facility], [place] Beckey Coe, NP Jordan Lee, NP Ellouise Class, NP Tinnie Redman, PharmD Jaun Bash, PharmD   Please be sure to bring in all your  medications bottles to every appointment.    Thank you for choosing [facility]

## 2023-12-10 NOTE — Progress Notes (Signed)
  Subjective:  Patient ID: Joanna [name], female    DOB: 02/24/1960,  MRN: [id number]   DOS: 08/21/23 Procedure: 1. Transmetatarsal amputation of right foot 2. Application of amniotic graft 5x5 cm, right foot  63 y.o. female seen for post op check.   Patient is now approaching 4 months out from procedure.  She is doing well she is walking with assist of a walker using a slipper.  Has been doing wound care as directed.  Reports seeing nerve pain and spasms  Review of Systems: Negative except as noted in the HPI. Denies N/V/F/Ch.   Objective:   Constitutional Well developed. Well nourished.  Vascular Foot warm and well perfused. Capillary refill normal to all digits.   No calf pain with palpation  Neurologic Normal speech. Oriented to person, place, and time. Epicritic sensation intact  Dermatologic Amputation site well coapted still with a very superficial healing ulceration nearly fully healed in the lateral aspect of the amputation line.  No drainage no evidence of infection   Orthopedic: S/p R foot TMA   Radiographs: Interval amputation of the first through fifth proximal metatarsals.   Pathology:  A. RIGHT FOOT, TRANSMETATARSAL AMPUTATION:  Dry gangrene/coagulative necrosis  Bone margin negative for osteomyelitis   Micro: None  Assessment:   1. Gangrene of toe of right foot (HCC)   2. Critical limb ischemia of both lower extremities (HCC)   3. Post-operative state    Gangrene of right forefoot s/p Transmetatarsal amputation  Plan:  Patient was evaluated and treated and all questions answered.  Almost 4 months s/p R foot TMA with graft application. -Progressing as expected post operatively.  Wound at the lateral aspect amputation site is almost fully healed continue with current wound care which is mupirocin  ointment and dry gauze dressing adhesive bandage -Will send prescription for Flexeril  and gabapentin for muscle spasms and neuropathic pain respectively. -XR:  expected post op changes -WB Status: Weightbearing in slipper -Sutures: Previously removed -Medications/ABX: No further antibiotics indicated -Dressing: As above daily mupirocin  ointment and dry gauze dressing or border foam dressing/adhesive bandage - F/u Plan: Follow-up in 6 weeks        Joanna [name], DPM [facility] / [facility]

## 2023-12-11 NOTE — Progress Notes (Signed)
 ADVANCED HE[facility] FAILURE CLINIC NOTE  Primary Care: Vicci Barnie NOVAK, MD HF Cardiology: Dr. Rolan  Chief complaint: CHF  HPI: Joanna [name] is a 63 y.o. female with hypertension, paroxysmal atrial fibrillation and nonischemic cardiomyopathy with EF as low as 20 to 25% presenting today for follow up. [name] reports being diagnosed with heart failure for > 10 years now with cath in 2007 demonstrating normal coronaries. She has had several admissions at [facility] dating back to 2020 when she required intubation for hypertensive urgency associated with pulmonary edema and atrial fibrillation. 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 During that admission, TTE w/ LVEF 25%-30% & severely dilated LA w/ severe eccentric MR.   Echo in 6/25 showed some improvement, EF 40-45%, mild global HK, RV normal, mild MR.   In 7/25, she developed severe pain from critical limb ischemia with gangrene of the first third right foot digits. She had peripheral intervention with stents to the right TP trunk, PT, and SFA.  She later had right TMA amputation.   She currently walks with a walker, still unsteady post-right TMA amputation.  No leg pain.  Has some phantom pain of the right foot. She has some shortness of breath when she hops with her walker. No chest pain.  No lightheadedness/palpitations.  She is in NSR today. No chest pain. She is a difficult historian, much of the information today comes from her relative accompanying her.   ECG (personally reviewed): NSR, normal with QTc 461  Labs (7/25): K 4.1, creatinine 0.71, LDL 27  PMH: 1. HTN 2. Atrial fibrillation: Paroxysmal. She is on dofetilide .  3. 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  4. Chronic systolic CHF: Thought to be nonischemic cardiomyopathy. Echoes have shown fluctuating EF over the years.  - LHC (2007):  Normal coronaries.  - Echo (2017): EF 40-45% - Echo (2/20): EF 20-25% - Echo (11/23): EF 25-30%, severe MR - Echo (1/25): EF 40-45%, mild MR - Echo (6/25): EF 40-45%, mild global HK, RV normal, mild MR.  5. PAD: Critical limb ischemia in 7/25.  Stent to right TP trunk and PT, stent to right SFA. She had right TMA.  - Peripheral arterial dopplers (10/25): occluded right PT.   Social History   Socioeconomic History   Marital status: Single    Spouse name: Not on file   Number of children: 1   Years of education: Not on file   Highest education level: High school graduate  Occupational History   Occupation: unemployed    Comment: on disablity  Tobacco Use   Smoking status: Every Day    Current packs/day: 0.00    Average packs/day: 2.0 packs/day for 30.0 years (60.0 ttl pk-yrs)    Types: Cigarettes    Start date: 02/09/1988    Last attempt to quit: 02/08/2018    Years since quitting: 5.8   Smokeless tobacco: Never   Tobacco comments:    3 cigarettes daily 02/09/22  Vaping Use   Vaping status: Never Used  Substance and Sexual Activity   Alcohol use: No    Comment: quit 2 years ago   Drug use: No   Sexual activity: Not Currently  Other Topics Concern   Not on file  Social History Narrative   Not on file   Social Drivers of Health   Financial Resource Strain: Low Risk  (07/17/2022)   Overall Financial Resource Strain (CARDIA)  Difficulty of Paying Living Expenses: Not hard at all  Food Insecurity: No Food Insecurity (08/26/2023)   Hunger Vital Sign    Worried About Running Out of Food in the Last Year: Never true    Ran Out of Food in the Last Year: Never true  Transportation Needs: No Transportation Needs (08/26/2023)   PRAPARE - Administrator, Civil Service (Medical): No    Lack of Transportation (Non-Medical): No  Physical Activity: Insufficiently Active (07/17/2022)   Exercise Vital Sign    Days of Exercise per Week: 3 days    Minutes of Exercise per  Session: 30 min  Stress: No Stress Concern Present (07/17/2022)   [employer] of Occupational Health - Occupational Stress Questionnaire    Feeling of Stress : Not at all  Social Connections: Moderately Isolated (03/04/2023)   Social Connection and Isolation Panel    Frequency of Communication with Friends and Family: More than three times a week    Frequency of Social Gatherings with Friends and Family: Three times a week    Attends Religious Services: More than 4 times per year    Active Member of Clubs or Organizations: No    Attends Banker Meetings: Never    Marital Status: Never married  Intimate Partner Violence: Not At Risk (08/26/2023)   Humiliation, Afraid, Rape, and Kick questionnaire    Fear of Current or Ex-Partner: No    Emotionally Abused: No    Physically Abused: No    Sexually Abused: No   Family History  Problem Relation Age of Onset   Cancer Mother    Heart disease Father    ROS: All systems reviewed and negative except as per HPI.     Current Outpatient Medications  Medication Sig Dispense Refill   apixaban  (ELIQUIS ) 5 MG TABS tablet Take 1 tablet (5 mg total) by mouth 2 (two) times daily. 200 tablet 2   atorvastatin  (LIPITOR) 40 MG tablet Take 1 tablet (40 mg total) by mouth daily. 90 tablet 2   carvedilol  (COREG ) 3.125 MG tablet Take 3.125 mg by mouth 2 (two) times daily with a meal.     clopidogrel  (PLAVIX ) 75 MG tablet Take 1 tablet (75 mg total) by mouth daily. 90 tablet 4   cyclobenzaprine  (FLEXERIL ) 5 MG tablet Take 1 tablet (5 mg total) by mouth 3 (three) times daily as needed for muscle spasms. 30 tablet 1   dofetilide  (TIKOSYN ) 125 MCG capsule TAKE 1 CAPSULE(125 MCG) BY MOUTH TWICE DAILY 180 capsule 0   ENTRESTO  24-26 MG Take 1 tablet by mouth 2 (two) times daily. 60 tablet 11   FARXIGA  10 MG TABS tablet Take 1 tablet (10 mg total) by mouth daily before breakfast. 90 tablet 3   feeding supplement (ENSURE IMMUNE HEALTH) LIQD Take 237  mLs by mouth 2 (two) times daily. If she has any     furosemide  (LASIX ) 40 MG tablet Take 20 mg by mouth daily.     methimazole  (TAPAZOLE ) 5 MG tablet Take 1 tablet (5 mg total) by mouth daily. 90 tablet 3   mupirocin  ointment (BACTROBAN ) 2 % Apply 1 Application topically daily. 22 g 0   potassium chloride  (KLOR-CON ) 10 MEQ tablet TAKE 1 TABLET(10 MEQ) BY MOUTH DAILY 90 tablet 3   spironolactone  (ALDACTONE ) 25 MG tablet Take 0.5 tablets (12.5 mg total) by mouth daily. 45 tablet 3   cyclobenzaprine  (FLEXERIL ) 5 MG tablet Take 1 tablet (5 mg total) by mouth 3 (three) times  daily as needed for muscle spasms. 30 tablet 1   gabapentin (NEURONTIN) 300 MG capsule Take 1 capsule (300 mg total) by mouth 2 (two) times daily. 60 capsule 3   No current facility-administered medications for this encounter.    PHYSICAL EXAM: Vitals:   12/10/23 1157  BP: 128/80  Pulse: 67  SpO2: 99%   General: NAD, thin Neck: No JVD, no thyromegaly or thyroid  nodule.  Lungs: Clear to auscultation bilaterally with normal respiratory effort. CV: Nondisplaced PMI.  Heart regular S1/S2, no S3/S4, no murmur.  No peripheral edema.  No carotid bruit.  Difficult to palpate pedal pulses.   Abdomen: Soft, nontender, no hepatosplenomegaly, no distention.  Skin: Intact without lesions or rashes.  Neurologic: Alert and oriented x 3.  Psych: Normal affect. Extremities: No clubbing or cyanosis. S/p right TMA amputation.  HEENT: Normal.   ASSESSMENT & PLAN:  1. Chronic systolic CHF: This has been presumed to be a nonischemic cardiomyopathy. Cath in 2007 with no significant CAD.  EF has fluctuated over the years. Probably not a purely tachycardia-mediated cardiomyopathy as EF has not corrected to normal in NSR.  Most recent echo in 6/25 with some improvement, EF 40-45%, mild global HK, RV normal, mild MR.  NYHA class III symptoms but seems to be limited most by recent TMA amputation and difficulty getting around with walker.  She  is not volume overloaded on exam. GDMT was cut back during 7/25 admission and has never been restarted.  - I will arrange for cardiac MRI to quantify LV function and also to look for potential causes of her cardiomyopathy.  - Continue Coreg  3.125 mg bid.  - Continue Lasix  20 mg daily.  - Continue spironolactone  12.5 daily.  - Restart Entresto  24/26 bid, BMET/BNP today and BMET in 10 days.  - Restart Farxiga  10 mg daily.  2. Mitral regurgitation: Severe on 11/23 echo, however only mild on most recent echo in 6/25.  3. Atrial fibrillation: Paroxysmal.  NSR today.  - Continue dofetilide , QTc acceptable today. - Continue apixaban .   4. Hyperthyroidism: Suspect related to amiodarone  use.  - Follow with endocrinology  - Off amiodarone  since 10/16/21 - Continue methimazole .  5. PAD: CLI in 7/25 with PCI to right leg/foot and right TMA. She denies claudication symptoms.  - Plavix  75mg  daily with peripheral stents in 7/25.  - Follows with [facility].  - Continue atorvastatin , check lipids today.   Followup in 3 wks with HF pharmacist for med titration, see APP in 6 wks.   I spent 32 minutes reviewing records, interviewing/examining patient, and managing orders.   Ezra Shuck 12/11/2023

## 2023-12-20 ENCOUNTER — Ambulatory Visit (HOSPITAL_COMMUNITY)

## 2024-01-01 ENCOUNTER — Telehealth: Payer: Self-pay

## 2024-01-01 NOTE — Telephone Encounter (Signed)
 [facility] mail order pharmacy requesting clarification on medications clopidogrel  and Eliquis . Pharmacy stating that pt has been prescribed clopidogrel  75 mg, filled locally, which may interact with Eliquis  5 mg prescribed by [facility]. Taking both of these medications together can cause increased bleeding risk. Is provider aware of the warnings and wish to continue treatment? Please address    Order# [id number]    Ph# [phone]

## 2024-01-01 NOTE — Telephone Encounter (Signed)
 These medications are not managed by AHF Clinic.   Eliquis  comes from [facility] with afib clinic  Plavix  comes from Dr Magda at [facility]

## 2024-01-05 NOTE — Progress Notes (Incomplete)
 ***In Progress***    Advanced Heart Failure Clinic Note   Primary Care: Joanna Barnie NOVAK, MD HF Cardiology: Dr. Rolan  HPI:  Joanna [name] is a 63 y.o. female with hypertension, paroxysmal atrial fibrillation and nonischemic cardiomyopathy with EF as low as 20 to 25%. Joanna [name] reports being diagnosed with heart failure for > 10 years now with cath in 2007 demonstrating normal coronaries. She has had several admissions at [facility] dating back to 2020 when she required intubation for hypertensive urgency associated with pulmonary edema and atrial fibrillation. 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 During that admission, TTE w/ LVEF 25%-30% & severely dilated LA w/ severe eccentric MR.    Echo in 07/2023 showed some improvement, EF 40-45%, mild global HK, RV normal, mild MR. In 08/2023, she developed severe pain from critical limb ischemia with gangrene of the first third right foot digits. She had peripheral intervention with stents to the right TP trunk, PT, and SFA.  She later had right TMA amputation.    She wast last seen in AHF Clinic by MD on 12/10/23. She walked with a walker, was still unsteady post-right TMA amputation. Denied leg pain. Had some phantom pain of the right foot. She had some shortness of breath when she hopped with her walker. Denied chest pain.  Denied lightheadedness/palpitations.  She was in NSR. She was a difficult historian, much of the information came from her relative accompanying her.   Today she returns to HF clinic for pharmacist medication titration. At last visit with MD, patient was restarted on Entresto  24/26 mg BID and Farxiga  10 mg daily. ***   Shortness of breath/dyspnea on exertion? {YES NO:22349}  Orthopnea/PND? {YES NO:22349} Edema? {YES NO:22349} Lightheadedness/dizziness? {YES NO:22349} Daily weights at home? {YES  NO:22349} Blood pressure/heart rate monitoring at home? {YES [id number] Following low-sodium/fluid-restricted diet? {YES NO:22349}  HF Medications: Carvedilol  3.125 mg BID *** Entresto  24/26 mg BID *** Spironolactone  12.5 mg daily *** Farxiga  10 mg daily *** Furosemide  20 mg daily ***  Has the patient been experiencing any side effects to the medications prescribed?  {YES NO:22349}  Does the patient have any problems obtaining medications due to transportation or finances?   {YES NO:22349}  Understanding of regimen: {excellent/good/fair/poor:19665} Understanding of indications: {excellent/good/fair/poor:19665} Potential of compliance: {excellent/good/fair/poor:19665} Patient understands to avoid NSAIDs. Patient understands to avoid decongestants.    Pertinent Lab Values: Serum creatinine 0.96 mg/dL, BUN 25 mg/dL, Potassium 4.9 mmol/L, Sodium 137 mmol/L, BNP 64.9 pg/mL  Vital Signs: Weight: *** (last clinic weight: 130 lbs) Blood pressure: *** (last clinic BP: 128/80 mHg) *** Heart rate: *** (last clinic HR: 67 bpm) ***  Assessment/Plan: 1. Chronic systolic CHF: This has been presumed to be a nonischemic cardiomyopathy. Cath in 2007 with no significant CAD.  EF has fluctuated over the years. Probably not a purely tachycardia-mediated cardiomyopathy as EF has not corrected to normal in NSR.  Most recent echo in 07/2023 with some improvement, EF 40-45%, mild global HK, RV normal, mild MR.  NYHA class III symptoms but seems to be limited most by recent TMA amputation and difficulty getting around with walker. GDMT was cut back during 08/2023 admission. - She is not volume overloaded on exam. Continue Lasix  20 mg daily. *** - Continue carvedilol  3.125 mg bid. *** - Continue Entresto  24/26 bid, BMET/BNP today and BMET in 10 days. *** - Continue spironolactone  12.5 daily *** -  Continue Farxiga  10 mg daily. *** - Dr. Rolan to arrange for cardiac MRI to quantify LV function and also to look  for potential causes of her cardiomyopathy.   2. Mitral regurgitation: Severe on 12/2021 echo, however only mild on most recent echo in 07/2023.   3. Atrial fibrillation: Paroxysmal.  NSR on 12/10/23.  - Continue dofetilide , QTc acceptable last visit with MD on 12/10/23. - Continue apixaban .    4. Hyperthyroidism: Suspect related to amiodarone  use.  - Follow with endocrinology  - Off amiodarone  since 10/16/21 - Continue methimazole .   5. PAD: CLI in 08/2023 with PCI to right leg/foot and right TMA. She denied claudication symptoms.  - Plavix  75mg  daily with peripheral stents in 08/2023.  - Follows with [facility].  - Continue atorvastatin , LDL WNL on 12/10/23.  Follow up with APP in 1 week  Morna Breach, PharmD PGY2 Cardiology Pharmacy Resident

## 2024-01-06 ENCOUNTER — Ambulatory Visit (HOSPITAL_COMMUNITY)
Admission: RE | Admit: 2024-01-06 | Discharge: 2024-01-06 | Disposition: A | Discharge status: Home / Self Care | Source: Ambulatory Visit

## 2024-01-07 NOTE — Telephone Encounter (Addendum)
 Form completed and faxed to [facility]. OK to continue both clopidogrel  and eliquis  per [facility].

## 2024-01-10 ENCOUNTER — Telehealth (HOSPITAL_COMMUNITY): Payer: Self-pay

## 2024-01-10 ENCOUNTER — Ambulatory Visit (HOSPITAL_COMMUNITY): Admission: RE | Admit: 2024-01-10 | Source: Ambulatory Visit

## 2024-01-10 NOTE — Progress Notes (Incomplete)
 ADVANCED HEART FAILURE CLINIC NOTE  Primary Care: Vicci Barnie NOVAK, MD HF Cardiology: Dr. Rolan  Chief complaint: CHF  HPI: Joanna [name] is a 63 y.o. female with hypertension, paroxysmal atrial fibrillation and nonischemic cardiomyopathy with EF as low as 20 to 25% presenting today for follow up. Joanna [name] reports being diagnosed with heart failure for > 10 years now with cath in 2007 demonstrating normal coronaries. She has had several admissions at [facility] dating back to 2020 when she required intubation for hypertensive urgency associated with pulmonary edema and atrial fibrillation. 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 During that admission, TTE w/ LVEF 25%-30% & severely dilated LA w/ severe eccentric MR.   Echo in 6/25 showed some improvement, EF 40-45%, mild global HK, RV normal, mild MR.   In 7/25, she developed severe pain from critical limb ischemia with gangrene of the first third right foot digits. She had peripheral intervention with stents to the right TP trunk, PT, and SFA.  She later had right TMA amputation.   She currently walks with a walker, still unsteady post-right TMA amputation.  No leg pain.  Has some phantom pain of the right foot. She has some shortness of breath when she hops with her walker. No chest pain.  No lightheadedness/palpitations.  She is in NSR today. No chest pain. She is a difficult historian, much of the information today comes from her relative accompanying her.   ECG (personally reviewed): NSR, normal with QTc 461  Labs (7/25): K 4.1, creatinine 0.71, LDL 27  PMH: 1. HTN 2. Atrial fibrillation: Paroxysmal. She is on dofetilide .  3. 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  4. Chronic systolic CHF: Thought to be nonischemic cardiomyopathy. Echoes have shown fluctuating EF over the years.  - LHC (2007):  Normal coronaries.  - Echo (2017): EF 40-45% - Echo (2/20): EF 20-25% - Echo (11/23): EF 25-30%, severe MR - Echo (1/25): EF 40-45%, mild MR - Echo (6/25): EF 40-45%, mild global HK, RV normal, mild MR.  5. PAD: Critical limb ischemia in 7/25.  Stent to right TP trunk and PT, stent to right SFA. She had right TMA.  - Peripheral arterial dopplers (10/25): occluded right PT.   Social History   Socioeconomic History   Marital status: Single    Spouse name: Not on file   Number of children: 1   Years of education: Not on file   Highest education level: High school graduate  Occupational History   Occupation: unemployed    Comment: on disablity  Tobacco Use   Smoking status: Every Day    Current packs/day: 0.00    Average packs/day: 2.0 packs/day for 30.0 years (60.0 ttl pk-yrs)    Types: Cigarettes    Start date: 02/09/1988    Last attempt to quit: 02/08/2018    Years since quitting: 5.9   Smokeless tobacco: Never   Tobacco comments:    3 cigarettes daily 02/09/22  Vaping Use   Vaping status: Never Used  Substance and Sexual Activity   Alcohol use: No    Comment: quit 2 years ago   Drug use: No   Sexual activity: Not Currently  Other Topics Concern   Not on file  Social History Narrative   Not on file   Social Drivers of Health   Financial Resource Strain: Low Risk  (07/17/2022)   Overall Financial Resource Strain (CARDIA)  Difficulty of Paying Living Expenses: Not hard at all  Food Insecurity: No Food Insecurity (08/26/2023)   Hunger Vital Sign    Worried About Running Out of Food in the Last Year: Never true    Ran Out of Food in the Last Year: Never true  Transportation Needs: No Transportation Needs (08/26/2023)   PRAPARE - Administrator, Civil Service (Medical): No    Lack of Transportation (Non-Medical): No  Physical Activity: Insufficiently Active (07/17/2022)   Exercise Vital Sign    Days of Exercise per Week: 3 days    Minutes of Exercise per  Session: 30 min  Stress: No Stress Concern Present (07/17/2022)   [employer] of Occupational Health - Occupational Stress Questionnaire    Feeling of Stress : Not at all  Social Connections: Moderately Isolated (03/04/2023)   Social Connection and Isolation Panel    Frequency of Communication with Friends and Family: More than three times a week    Frequency of Social Gatherings with Friends and Family: Three times a week    Attends Religious Services: More than 4 times per year    Active Member of Clubs or Organizations: No    Attends Banker Meetings: Never    Marital Status: Never married  Intimate Partner Violence: Not At Risk (08/26/2023)   Humiliation, Afraid, Rape, and Kick questionnaire    Fear of Current or Ex-Partner: No    Emotionally Abused: No    Physically Abused: No    Sexually Abused: No   Family History  Problem Relation Age of Onset   Cancer Mother    Heart disease Father    ROS: All systems reviewed and negative except as per HPI.     Current Outpatient Medications  Medication Sig Dispense Refill   apixaban  (ELIQUIS ) 5 MG TABS tablet Take 1 tablet (5 mg total) by mouth 2 (two) times daily. 200 tablet 2   atorvastatin  (LIPITOR) 40 MG tablet Take 1 tablet (40 mg total) by mouth daily. 90 tablet 2   carvedilol  (COREG ) 3.125 MG tablet Take 3.125 mg by mouth 2 (two) times daily with a meal.     clopidogrel  (PLAVIX ) 75 MG tablet Take 1 tablet (75 mg total) by mouth daily. 90 tablet 4   cyclobenzaprine  (FLEXERIL ) 5 MG tablet Take 1 tablet (5 mg total) by mouth 3 (three) times daily as needed for muscle spasms. 30 tablet 1   cyclobenzaprine  (FLEXERIL ) 5 MG tablet Take 1 tablet (5 mg total) by mouth 3 (three) times daily as needed for muscle spasms. 30 tablet 1   dofetilide  (TIKOSYN ) 125 MCG capsule TAKE 1 CAPSULE(125 MCG) BY MOUTH TWICE DAILY 180 capsule 0   ENTRESTO  24-26 MG Take 1 tablet by mouth 2 (two) times daily. 60 tablet 11   FARXIGA  10 MG  TABS tablet Take 1 tablet (10 mg total) by mouth daily before breakfast. 90 tablet 3   feeding supplement (ENSURE IMMUNE HEALTH) LIQD Take 237 mLs by mouth 2 (two) times daily. If she has any     furosemide  (LASIX ) 40 MG tablet Take 20 mg by mouth daily.     gabapentin  (NEURONTIN ) 300 MG capsule Take 1 capsule (300 mg total) by mouth 2 (two) times daily. 60 capsule 3   methimazole  (TAPAZOLE ) 5 MG tablet Take 1 tablet (5 mg total) by mouth daily. 90 tablet 3   mupirocin  ointment (BACTROBAN ) 2 % Apply 1 Application topically daily. 22 g 0   potassium chloride  (KLOR-CON ) 10  MEQ tablet TAKE 1 TABLET(10 MEQ) BY MOUTH DAILY 90 tablet 3   spironolactone  (ALDACTONE ) 25 MG tablet Take 0.5 tablets (12.5 mg total) by mouth daily. 45 tablet 3   No current facility-administered medications for this visit.    PHYSICAL EXAM: There were no vitals filed for this visit.  General: NAD, thin Neck: No JVD, no thyromegaly or thyroid  nodule.  Lungs: Clear to auscultation bilaterally with normal respiratory effort. CV: Nondisplaced PMI.  Heart regular S1/S2, no S3/S4, no murmur.  No peripheral edema.  No carotid bruit.  Difficult to palpate pedal pulses.   Abdomen: Soft, nontender, no hepatosplenomegaly, no distention.  Skin: Intact without lesions or rashes.  Neurologic: Alert and oriented x 3.  Psych: Normal affect. Extremities: No clubbing or cyanosis. S/p right TMA amputation.  HEENT: Normal.   ASSESSMENT & PLAN:  1. Chronic systolic CHF: This has been presumed to be a nonischemic cardiomyopathy. Cath in 2007 with no significant CAD.  EF has fluctuated over the years. Probably not a purely tachycardia-mediated cardiomyopathy as EF has not corrected to normal in NSR.  Most recent echo in 6/25 with some improvement, EF 40-45%, mild global HK, RV normal, mild MR.  NYHA class III symptoms but seems to be limited most by recent TMA amputation and difficulty getting around with walker.  She is not volume  overloaded on exam. GDMT was cut back during 7/25 admission and has never been restarted.  - I will arrange for cardiac MRI to quantify LV function and also to look for potential causes of her cardiomyopathy.  - Continue Coreg  3.125 mg bid.  - Continue Lasix  20 mg daily.  - Continue spironolactone  12.5 daily.  - Restart Entresto  24/26 bid, BMET/BNP today and BMET in 10 days.  - Restart Farxiga  10 mg daily.  2. Mitral regurgitation: Severe on 11/23 echo, however only mild on most recent echo in 6/25.  3. Atrial fibrillation: Paroxysmal.  NSR today.  - Continue dofetilide , QTc acceptable today. - Continue apixaban .   4. Hyperthyroidism: Suspect related to amiodarone  use.  - Follow with endocrinology  - Off amiodarone  since 10/16/21 - Continue methimazole .  5. PAD: CLI in 7/25 with PCI to right leg/foot and right TMA. She denies claudication symptoms.  - Plavix  75mg  daily with peripheral stents in 7/25.  - Follows with [facility].  - Continue atorvastatin , check lipids today.   Followup in 3 wks with HF pharmacist for med titration, see APP in 6 wks.   I spent 32 minutes reviewing records, interviewing/examining patient, and managing orders.   Harlene HERO [facility] 01/10/2024

## 2024-01-10 NOTE — Telephone Encounter (Signed)
 Called to confirm/remind patient of their appointment at the Advanced Heart Failure Clinic on 01/13/24.   Appointment:   [] Confirmed  [x] Left mess   [] No answer/No voice mail  [] VM Full/unable to leave message  [] Phone not in service  And to bring in all medications and/or complete list.

## 2024-01-13 ENCOUNTER — Ambulatory Visit (HOSPITAL_COMMUNITY)

## 2024-01-14 ENCOUNTER — Other Ambulatory Visit: Payer: Self-pay | Admitting: Internal Medicine

## 2024-01-14 ENCOUNTER — Other Ambulatory Visit (HOSPITAL_COMMUNITY): Payer: Self-pay | Admitting: Internal Medicine

## 2024-01-14 DIAGNOSIS — I5022 Chronic systolic (congestive) heart failure: Secondary | ICD-10-CM

## 2024-01-14 DIAGNOSIS — I739 Peripheral vascular disease, unspecified: Secondary | ICD-10-CM

## 2024-01-17 ENCOUNTER — Other Ambulatory Visit (HOSPITAL_COMMUNITY): Payer: Self-pay

## 2024-01-17 MED ORDER — FUROSEMIDE 40 MG PO TABS: 20.0000 mg | ORAL_TABLET | Freq: Every day | ORAL | 2 refills | Status: DC

## 2024-01-20 ENCOUNTER — Other Ambulatory Visit (HOSPITAL_COMMUNITY): Payer: Self-pay | Admitting: *Deleted

## 2024-01-20 MED ORDER — POTASSIUM CHLORIDE ER 10 MEQ PO TBCR: 10.0000 meq | EXTENDED_RELEASE_TABLET | Freq: Every day | ORAL | 0 refills | Status: AC

## 2024-01-21 ENCOUNTER — Encounter: Payer: Self-pay | Admitting: Podiatry

## 2024-01-21 ENCOUNTER — Ambulatory Visit: Admitting: Podiatry

## 2024-01-21 DIAGNOSIS — I96 Gangrene, not elsewhere classified: Secondary | ICD-10-CM | POA: Diagnosis not present

## 2024-01-21 MED ORDER — GABAPENTIN 300 MG PO CAPS: 300.0000 mg | ORAL_CAPSULE | Freq: Three times a day (TID) | ORAL | 3 refills | Status: AC

## 2024-01-21 MED ORDER — CYCLOBENZAPRINE HCL 5 MG PO TABS: 5.0000 mg | ORAL_TABLET | Freq: Three times a day (TID) | ORAL | 1 refills | Status: AC | PRN

## 2024-01-21 NOTE — Progress Notes (Signed)
°  Subjective:  Patient ID: Joanna [name], female    DOB: 1960/02/16,  MRN: [id number]   DOS: 08/21/23 Procedure: 1. Transmetatarsal amputation of right foot 2. Application of amniotic graft 5x5 cm, right foot  63 y.o. female seen for post op check.   Patient is now approaching 5.5 months out from procedure.  She reports she is doing well she has some occasional burning and spasms in the right foot otherwise no issues fully healed no drainage  Review of Systems: Negative except as noted in the HPI. Denies N/V/F/Ch.   Objective:   Constitutional Well developed. Well nourished.  Vascular Foot warm and well perfused. Capillary refill normal to all digits.   No calf pain with palpation  Neurologic Normal speech. Oriented to person, place, and time. Epicritic sensation intact  Dermatologic Amputation site well coapted     Orthopedic: S/p R foot TMA   Radiographs: Interval amputation of the first through fifth proximal metatarsals.   Pathology:  A. RIGHT FOOT, TRANSMETATARSAL AMPUTATION:  Dry gangrene/coagulative necrosis  Bone margin negative for osteomyelitis   Micro: None  Assessment:   1. Gangrene of toe of right foot (HCC)     Gangrene of right forefoot s/p Transmetatarsal amputation  Plan:  Patient was evaluated and treated and all questions answered.  Almost 5.5 months s/p R foot TMA with graft application. - Fully healed in regards to the right foot TMA. -Will send prescription for Flexeril  and gabapentin  for muscle spasms and neuropathic pain respectively. -XR: expected post op changes -WB Status: Weightbearing in slipper, recommend referral to our pedorthist for custom insert with toe filler -Sutures: Previously removed -Medications/ABX: No further antibiotics indicated -Dressing: Required okay to wash the foot - F/u Plan: Follow-up in 6 months        [name], DPM [facility] / [facility]

## 2024-02-04 ENCOUNTER — Telehealth (HOSPITAL_COMMUNITY): Payer: Self-pay

## 2024-02-04 DIAGNOSIS — I5042 Chronic combined systolic (congestive) and diastolic (congestive) heart failure: Secondary | ICD-10-CM

## 2024-02-04 MED ORDER — FUROSEMIDE 20 MG PO TABS: 20.0000 mg | ORAL_TABLET | Freq: Every day | ORAL | 1 refills | Status: AC

## 2024-02-04 NOTE — Telephone Encounter (Signed)
 Patient's lasix  medication has been sent to mail pharmacy per pt's request.

## 2024-02-05 NOTE — Addendum Note (Signed)
 Encounter addended by: Serena Morna SAUNDERS, [facility] on: 02/05/2024 8:02 PM  Actions taken: Delete clinical note

## 2024-02-10 NOTE — Progress Notes (Incomplete)
 ***In Progress***    Advanced Heart Failure Clinic Note   HPI:  Joanna [name] is a 64 y.o. female with hypertension, paroxysmal atrial fibrillation and nonischemic cardiomyopathy with EF as low as 20 to 25% presenting today for follow up. Joanna [name] reports being diagnosed with heart failure for > 10 years now with cath in 2007 demonstrating normal coronaries. She has had several admissions at [facility] dating back to 2020 when she required intubation for hypertensive urgency associated with pulmonary edema and atrial fibrillation. 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 During that admission, TTE w/ LVEF 25%-30% & severely dilated LA w/ severe eccentric MR.    Echo in 07/2023 showed some improvement, EF 40-45%, mild global HK, RV normal, mild MR.    In 08/2023, she developed severe pain from critical limb ischemia with gangrene of the first third right foot digits. She had peripheral intervention with stents to the right TP trunk, PT, and SFA.  She later had right TMA amputation.    Presented to AHF Clinic with Dr. Rolan 12/10/23. She was walking with a walker, still unsteady post-right TMA amputation.  No leg pain.  Had some phantom pain of the right foot. She had some shortness of breath when she hops with her walker. No chest pain.  No lightheadedness/palpitations.  She was in NSR in clinic. No chest pain. It was noted she was a difficult historian, much of the information came from her relative accompanying her.   Today he returns to HF clinic for pharmacist medication titration. At last visit with MD Entresto  24/26 mg BID and Farxiga  10 mg daily were restarted.   Shortness of breath/dyspnea on exertion? {YES NO:22349}  Orthopnea/PND? {YES NO:22349} Edema? {YES NO:22349} Lightheadedness/dizziness? {YES NO:22349} Daily weights at home? {YES NO:22349} Blood pressure/heart  rate monitoring at home? {YES [id number] Following low-sodium/fluid-restricted diet? {YES NO:22349}  HF Medications: Carvedilol  3.125 mg BID Entresto  24/26 mg BID Spironolactone  12.5 mg daily Farxiga  10 mg daily Lasix  20 mg daily  Has the patient been experiencing any side effects to the medications prescribed?  {YES NO:22349}  Does the patient have any problems obtaining medications due to transportation or finances?   {YES NO:22349}  Understanding of regimen: {excellent/good/fair/poor:19665} Understanding of indications: {excellent/good/fair/poor:19665} Potential of compliance: {excellent/good/fair/poor:19665} Patient understands to avoid NSAIDs. Patient understands to avoid decongestants.    Pertinent Lab Values: Serum creatinine ***, BUN ***, Potassium ***, Sodium ***, BNP ***, Magnesium  ***, Digoxin  ***   Vital Signs: Weight: *** (last clinic weight: ***) Blood pressure: ***  Heart rate: ***   Assessment/Plan: 1. Chronic systolic CHF: This has been presumed to be a nonischemic cardiomyopathy. Cath in 2007 with no significant CAD.  EF has fluctuated over the years. Probably not a purely tachycardia-mediated cardiomyopathy as EF has not corrected to normal in NSR.  Most recent echo in 07/2023 with some improvement, EF 40-45%, mild global HK, RV normal, mild MR.  -NYHA class III symptoms but seems to be limited most by recent TMA amputation and difficulty getting around with walker.  She is not volume overloaded on exam***. GDMT was cut back during 7/25 admission and has never been restarted***.  - Cardiac MRI to quantify LV function and also to look for potential causes of her cardiomyopathy scheduled for 02/28/24.  - Continue Lasix  20 mg daily.  - Continue carvedilol  3.125 mg BID.  - Continue spironolactone  12.5 daily.  -  Continue Entresto  24/26 mg BID - Continue Farxiga  10 mg daily. History of critical limb ischemia with gangrene now s/p transmetatarsal amputation of right foot.  Monitor carefully.  2. Mitral regurgitation: Severe on 12/2021 echo, however only mild on most recent echo in 07/2023.  3. Atrial fibrillation: Paroxysmal.   - Continue dofetilide . Monitor carefully for drug interactions.  - Continue apixaban .   4. Hyperthyroidism: Suspect related to amiodarone  use.  - Follow with endocrinology  - Off amiodarone  since 10/16/21 - Continue methimazole .  5. PAD: CLI in 08/2023 with PCI to right leg/foot and right TMA. She denies claudication symptoms.  - Plavix  75mg  daily with peripheral stents in 08/2023.  - Follows with [facility].  - Continue atorvastatin     Follow up ***   Tinnie Redman, PharmD, BCPS, BCCP, CPP Heart Failure Clinic Pharmacist [phone]

## 2024-02-12 ENCOUNTER — Ambulatory Visit (HOSPITAL_COMMUNITY)

## 2024-02-14 ENCOUNTER — Ambulatory Visit: Admitting: Internal Medicine

## 2024-02-27 ENCOUNTER — Other Ambulatory Visit (HOSPITAL_COMMUNITY): Payer: Self-pay | Admitting: Internal Medicine

## 2024-02-28 ENCOUNTER — Ambulatory Visit: Admitting: Podiatrist

## 2024-02-28 ENCOUNTER — Ambulatory Visit (HOSPITAL_COMMUNITY): Admission: RE | Admit: 2024-02-28 | Source: Ambulatory Visit

## 2024-02-28 ENCOUNTER — Other Ambulatory Visit: Payer: Self-pay | Admitting: Podiatry

## 2024-02-28 DIAGNOSIS — I96 Gangrene, not elsewhere classified: Secondary | ICD-10-CM

## 2024-02-28 DIAGNOSIS — I739 Peripheral vascular disease, unspecified: Secondary | ICD-10-CM

## 2024-02-28 DIAGNOSIS — I70223 Atherosclerosis of native arteries of extremities with rest pain, bilateral legs: Secondary | ICD-10-CM

## 2024-02-28 DIAGNOSIS — Z89431 Acquired absence of right foot: Secondary | ICD-10-CM

## 2024-02-28 MED ORDER — OXYCODONE-ACETAMINOPHEN 5-325 MG PO TABS: 1.0000 | ORAL_TABLET | ORAL | 0 refills | Status: AC | PRN

## 2024-02-28 NOTE — Progress Notes (Signed)
 Patient presents today to be measured for a toe filler for the Right TMA  She also has a LLD on the right where she is several inches shorter.   Patient reports wearing a size 9.5 shoe  Patient was casted using a foam box for the right foot needing a toe filler  ABN signed for the toe filler right foot.  Prior auth for L5000 will be requested.       ICD-10-CM   1. History of transmetatarsal amputation of right foot (HCC)  Z89.431     2. Critical limb ischemia of both lower extremities (HCC)  I70.223     3. Gangrene of toe of right foot (HCC)  I96     4. PAD (peripheral artery disease)  I73.9

## 2024-03-05 ENCOUNTER — Inpatient Hospital Stay (HOSPITAL_COMMUNITY)
Admission: EM | Admit: 2024-03-05 | Discharge: 2024-03-09 | DRG: 308 | Disposition: A | Attending: Internal Medicine | Admitting: Internal Medicine

## 2024-03-05 ENCOUNTER — Emergency Department (HOSPITAL_COMMUNITY)

## 2024-03-05 ENCOUNTER — Other Ambulatory Visit: Payer: Self-pay

## 2024-03-05 DIAGNOSIS — I480 Paroxysmal atrial fibrillation: Principal | ICD-10-CM | POA: Diagnosis present

## 2024-03-05 DIAGNOSIS — Z7984 Long term (current) use of oral hypoglycemic drugs: Secondary | ICD-10-CM

## 2024-03-05 DIAGNOSIS — E064 Drug-induced thyroiditis: Secondary | ICD-10-CM | POA: Diagnosis not present

## 2024-03-05 DIAGNOSIS — E860 Dehydration: Secondary | ICD-10-CM | POA: Diagnosis present

## 2024-03-05 DIAGNOSIS — I4892 Unspecified atrial flutter: Secondary | ICD-10-CM | POA: Diagnosis not present

## 2024-03-05 DIAGNOSIS — I4891 Unspecified atrial fibrillation: Secondary | ICD-10-CM | POA: Diagnosis not present

## 2024-03-05 DIAGNOSIS — T462X5A Adverse effect of other antidysrhythmic drugs, initial encounter: Secondary | ICD-10-CM | POA: Diagnosis not present

## 2024-03-05 DIAGNOSIS — F17210 Nicotine dependence, cigarettes, uncomplicated: Secondary | ICD-10-CM | POA: Diagnosis present

## 2024-03-05 DIAGNOSIS — F32A Depression, unspecified: Secondary | ICD-10-CM | POA: Diagnosis present

## 2024-03-05 DIAGNOSIS — I3139 Other pericardial effusion (noninflammatory): Secondary | ICD-10-CM | POA: Diagnosis present

## 2024-03-05 DIAGNOSIS — I5043 Acute on chronic combined systolic (congestive) and diastolic (congestive) heart failure: Secondary | ICD-10-CM | POA: Diagnosis present

## 2024-03-05 DIAGNOSIS — I130 Hypertensive heart and chronic kidney disease with heart failure and stage 1 through stage 4 chronic kidney disease, or unspecified chronic kidney disease: Secondary | ICD-10-CM | POA: Diagnosis present

## 2024-03-05 DIAGNOSIS — E875 Hyperkalemia: Secondary | ICD-10-CM | POA: Diagnosis present

## 2024-03-05 DIAGNOSIS — N179 Acute kidney failure, unspecified: Secondary | ICD-10-CM | POA: Diagnosis present

## 2024-03-05 DIAGNOSIS — Z79890 Hormone replacement therapy (postmenopausal): Secondary | ICD-10-CM

## 2024-03-05 DIAGNOSIS — Z8249 Family history of ischemic heart disease and other diseases of the circulatory system: Secondary | ICD-10-CM

## 2024-03-05 DIAGNOSIS — I10 Essential (primary) hypertension: Secondary | ICD-10-CM | POA: Diagnosis not present

## 2024-03-05 DIAGNOSIS — I739 Peripheral vascular disease, unspecified: Secondary | ICD-10-CM | POA: Diagnosis not present

## 2024-03-05 DIAGNOSIS — Z7902 Long term (current) use of antithrombotics/antiplatelets: Secondary | ICD-10-CM

## 2024-03-05 DIAGNOSIS — I5042 Chronic combined systolic (congestive) and diastolic (congestive) heart failure: Secondary | ICD-10-CM | POA: Diagnosis present

## 2024-03-05 DIAGNOSIS — Z560 Unemployment, unspecified: Secondary | ICD-10-CM

## 2024-03-05 DIAGNOSIS — R54 Age-related physical debility: Secondary | ICD-10-CM | POA: Diagnosis present

## 2024-03-05 DIAGNOSIS — Z7901 Long term (current) use of anticoagulants: Secondary | ICD-10-CM

## 2024-03-05 DIAGNOSIS — I484 Atypical atrial flutter: Secondary | ICD-10-CM | POA: Diagnosis present

## 2024-03-05 DIAGNOSIS — I959 Hypotension, unspecified: Secondary | ICD-10-CM | POA: Diagnosis present

## 2024-03-05 DIAGNOSIS — R579 Shock, unspecified: Secondary | ICD-10-CM

## 2024-03-05 DIAGNOSIS — Z79899 Other long term (current) drug therapy: Secondary | ICD-10-CM

## 2024-03-05 DIAGNOSIS — J449 Chronic obstructive pulmonary disease, unspecified: Secondary | ICD-10-CM | POA: Diagnosis present

## 2024-03-05 DIAGNOSIS — Z89431 Acquired absence of right foot: Secondary | ICD-10-CM | POA: Diagnosis not present

## 2024-03-05 DIAGNOSIS — R570 Cardiogenic shock: Secondary | ICD-10-CM | POA: Diagnosis present

## 2024-03-05 DIAGNOSIS — I340 Nonrheumatic mitral (valve) insufficiency: Secondary | ICD-10-CM | POA: Diagnosis present

## 2024-03-05 DIAGNOSIS — I428 Other cardiomyopathies: Secondary | ICD-10-CM | POA: Diagnosis present

## 2024-03-05 DIAGNOSIS — Z809 Family history of malignant neoplasm, unspecified: Secondary | ICD-10-CM

## 2024-03-05 DIAGNOSIS — E861 Hypovolemia: Secondary | ICD-10-CM | POA: Diagnosis present

## 2024-03-05 DIAGNOSIS — Z716 Tobacco abuse counseling: Secondary | ICD-10-CM

## 2024-03-05 DIAGNOSIS — E785 Hyperlipidemia, unspecified: Secondary | ICD-10-CM | POA: Diagnosis present

## 2024-03-05 LAB — CBC
HCT: 45.9 % (ref 36.0–46.0)
Hemoglobin: 14.4 g/dL (ref 12.0–15.0)
MCH: 28.7 pg (ref 26.0–34.0)
MCHC: 31.4 g/dL (ref 30.0–36.0)
MCV: 91.6 fL (ref 80.0–100.0)
Platelets: 443 10*3/uL — ABNORMAL HIGH (ref 150–400)
RBC: 5.01 MIL/uL (ref 3.87–5.11)
RDW: 12.7 % (ref 11.5–15.5)
WBC: 10 10*3/uL (ref 4.0–10.5)
nRBC: 0 % (ref 0.0–0.2)

## 2024-03-05 LAB — POTASSIUM: Potassium: 4.8 mmol/L (ref 3.5–5.1)

## 2024-03-05 LAB — BASIC METABOLIC PANEL WITH GFR
Anion gap: 19 — ABNORMAL HIGH (ref 5–15)
BUN: 68 mg/dL — ABNORMAL HIGH (ref 8–23)
CO2: 16 mmol/L — ABNORMAL LOW (ref 22–32)
Calcium: 8.7 mg/dL — ABNORMAL LOW (ref 8.9–10.3)
Chloride: 104 mmol/L (ref 98–111)
Creatinine, Ser: 2.9 mg/dL — ABNORMAL HIGH (ref 0.44–1.00)
GFR, Estimated: 17 mL/min — ABNORMAL LOW
Glucose, Bld: 94 mg/dL (ref 70–99)
Potassium: 5 mmol/L (ref 3.5–5.1)
Sodium: 138 mmol/L (ref 135–145)

## 2024-03-05 LAB — COMPREHENSIVE METABOLIC PANEL WITH GFR
ALT: <5 U/L (ref 0–44)
AST: 15 U/L (ref 15–41)
Albumin: 4 g/dL (ref 3.5–5.0)
Alkaline Phosphatase: 88 U/L (ref 38–126)
Anion gap: 20 — ABNORMAL HIGH (ref 5–15)
BUN: 68 mg/dL — ABNORMAL HIGH (ref 8–23)
CO2: 17 mmol/L — ABNORMAL LOW (ref 22–32)
Calcium: 9.4 mg/dL (ref 8.9–10.3)
Chloride: 100 mmol/L (ref 98–111)
Creatinine, Ser: 3.42 mg/dL — ABNORMAL HIGH (ref 0.44–1.00)
GFR, Estimated: 14 mL/min — ABNORMAL LOW
Glucose, Bld: 102 mg/dL — ABNORMAL HIGH (ref 70–99)
Potassium: 5.8 mmol/L — ABNORMAL HIGH (ref 3.5–5.1)
Sodium: 136 mmol/L (ref 135–145)
Total Bilirubin: 0.5 mg/dL (ref 0.0–1.2)
Total Protein: 8.1 g/dL (ref 6.5–8.1)

## 2024-03-05 LAB — I-STAT CHEM 8, ED
BUN: 77 mg/dL — ABNORMAL HIGH (ref 8–23)
Calcium, Ion: 1.09 mmol/L — ABNORMAL LOW (ref 1.15–1.40)
Chloride: 106 mmol/L (ref 98–111)
Creatinine, Ser: 3.8 mg/dL — ABNORMAL HIGH (ref 0.44–1.00)
Glucose, Bld: 97 mg/dL (ref 70–99)
HCT: 48 % — ABNORMAL HIGH (ref 36.0–46.0)
Hemoglobin: 16.3 g/dL — ABNORMAL HIGH (ref 12.0–15.0)
Potassium: 5.9 mmol/L — ABNORMAL HIGH (ref 3.5–5.1)
Sodium: 139 mmol/L (ref 135–145)
TCO2: 21 mmol/L — ABNORMAL LOW (ref 22–32)

## 2024-03-05 LAB — MAGNESIUM: Magnesium: 1.7 mg/dL (ref 1.7–2.4)

## 2024-03-05 LAB — PRO BRAIN NATRIURETIC PEPTIDE: Pro Brain Natriuretic Peptide: 8152 pg/mL — ABNORMAL HIGH

## 2024-03-05 LAB — TSH: TSH: 2.4 u[IU]/mL (ref 0.350–4.500)

## 2024-03-05 LAB — T4, FREE: Free T4: 1.12 ng/dL (ref 0.80–2.00)

## 2024-03-05 LAB — OSMOLALITY: Osmolality: 319 mosm/kg — ABNORMAL HIGH (ref 275–295)

## 2024-03-05 LAB — LACTIC ACID, PLASMA: Lactic Acid, Venous: 1.9 mmol/L (ref 0.5–1.9)

## 2024-03-05 LAB — CK: Total CK: 70 U/L (ref 38–234)

## 2024-03-05 MED ORDER — AMIODARONE HCL IN DEXTROSE 360-4.14 MG/200ML-% IV SOLN
60.0000 mg/h | INTRAVENOUS | Status: AC
  Administered 2024-03-05: 60 mg/h via INTRAVENOUS
  Filled 2024-03-05 (×2): qty 200

## 2024-03-05 MED ORDER — SODIUM ZIRCONIUM CYCLOSILICATE 5 G PO PACK
5.0000 g | PACK | Freq: Once | ORAL | Status: AC
  Administered 2024-03-05: 5 g via ORAL
  Filled 2024-03-05: qty 1

## 2024-03-05 MED ORDER — SODIUM CHLORIDE 0.9 % IV BOLUS
500.0000 mL | Freq: Once | INTRAVENOUS | Status: AC
  Administered 2024-03-05: 500 mL via INTRAVENOUS

## 2024-03-05 MED ORDER — AMIODARONE HCL IN DEXTROSE 360-4.14 MG/200ML-% IV SOLN
30.0000 mg/h | INTRAVENOUS | Status: DC
  Administered 2024-03-06 – 2024-03-07 (×3): 30 mg/h via INTRAVENOUS
  Filled 2024-03-05 (×3): qty 200

## 2024-03-05 MED ORDER — AMIODARONE LOAD VIA INFUSION
150.0000 mg | Freq: Once | INTRAVENOUS | Status: AC
  Administered 2024-03-05: 150 mg via INTRAVENOUS
  Filled 2024-03-05: qty 83.34

## 2024-03-05 NOTE — Subjective & Objective (Signed)
 Patient comes with chest discomfort shortness of breath, lightheadedness weakness  Yesterday she had an episode of some palpitations she tried to lay down and sleep it off today she had 2 more episodes at that point she called EMS on their arrival Noted to have tachycardia heart rate 240 to 270s at times On arrival heart rate 161 vagal maneuvers attempted brought it down to 125 but heart rate went back up.  In the ER patient became hypotensive with blood pressure 83/66 Patient did endorse some nausea vomiting diarrhea, mild URI symptoms Cardiology was consulted they felt that initially patient did have an episode of a flutter in the emergency department heart rates of 140 after IV fluids heart rate went down to 120s patient appears to be significantly dehydrated She has known history of systolic heart failure with EF of 40-45% per echogram in June 2025 According to cardiology note patient has history of amiodarone  induced hyperthyroidism  Recommend patient to be n.p.o. postmidnight for possible DCCV in the morning

## 2024-03-05 NOTE — ED Notes (Signed)
 Steinl MD notified of BP 83/66 MAP 72, HR 133 Sinus Tach. No orders given at this time. Will continue to monitor.

## 2024-03-05 NOTE — Assessment & Plan Note (Addendum)
 Seen by cardiology they recommend IV fluids and start on amiodarone  drip Will check TSH Continue Eliquis  N.p.o. postmidnight for possible cardioversion

## 2024-03-05 NOTE — Consult Note (Signed)
 " Cardiology Consultation:  Patient ID: Joanna [name] MRN: [id number]; DOB: 01-05-1961 Admit date: 03/05/2024 Date of Consult: 03/05/2024  Primary Care Provider: Vicci Barnie NOVAK, MD Primary Cardiologist: Aleene Passe, MD (Inactive)  Primary Electrophysiologist:  None   Patient Profile:  Joanna [name] is a 64 y.o. female with a hx of NICM (40-45% 6/25), PAD s/p stents (7/25) and R TMA, amio-induced hyperthyroidism, Afib on Eliquis  who is being seen today for the evaluation of SVT at the request of Dr. Bernard.  History of Present Illness:  Joanna [name] presents with SVT.  She reported that yesterday while standing, she started to feel lightheaded and blurry vision.  At time, she also had some chest palpitation.  She decided to lay down and just went to sleep.  Today, she had 2 more episode of lightheadedness when she tried to stand up.  At this time she called EMS.  When EMS, per the report, she was in SVT to 250 which improved with vagal maneuver to the 120s.  Patient reported over the last couple days, she has had some poor appetite, had an episode of vomiting yesterday, and some loose stool.  She feels like her mouth is dry.  She denies any fever but has had a runny nose.  While in the ED, she was initially in atrial flutter in the 140s with systolic blood pressure in the 80s.  She received half a liter of IV fluid with improvement in her rate to the 120s to 130s.  Her labs show significant AKI.  Past Medical History:  Diagnosis Date   Atrial fibrillation with RVR (HCC)    Chronic combined systolic (congestive) and diastolic (congestive) heart failure (HCC)    Dyspnea    Goiter    Hypertension    NICM (nonischemic cardiomyopathy) (HCC)    a. 08/2005 Echo: EF 30-35%, mod diff HK. Mild to Mod MR. Mildly dil LA; b. 08/2005 Cath: Nl Cors. Elevated CO w/o shunt; c. 09/2007 Echo: EF 45%. Mild to mod MR; c. 03/2015 Echo: EF 45%, global HK. Gr1 DD. Mild MR. Mildly dil LA.    Past Surgical History:   Procedure Laterality Date   ABDOMINAL AORTOGRAM N/A 08/19/2023   Procedure: ABDOMINAL AORTOGRAM;  Surgeon: Sheree Penne Bruckner, MD;  Location: [facility] INVASIVE CV LAB;  Service: Cardiovascular;  Laterality: N/A;   ABDOMINAL AORTOGRAM W/LOWER EXTREMITY N/A 03/07/2023   Procedure: ABDOMINAL AORTOGRAM W/LOWER EXTREMITY;  Surgeon: Gretta Bruckner PARAS, MD;  Location: [facility] INVASIVE CV LAB;  Service: Cardiovascular;  Laterality: N/A;   ABDOMINAL AORTOGRAM W/LOWER EXTREMITY N/A 03/27/2023   Procedure: ABDOMINAL AORTOGRAM W/LOWER EXTREMITY;  Surgeon: Lanis Fonda BRAVO, MD;  Location: [facility] INVASIVE CV LAB;  Service: Cardiovascular;  Laterality: N/A;   APPENDECTOMY     BACK SURGERY     CARDIOVERSION N/A 03/14/2018   Procedure: CARDIOVERSION;  Surgeon: Okey Vina GAILS, MD;  Location: [facility] ENDOSCOPY;  Service: Cardiovascular;  Laterality: N/A;   LOWER EXTREMITY ANGIOGRAPHY Right 08/19/2023   Procedure: Lower Extremity Angiography;  Surgeon: Sheree Penne Bruckner, MD;  Location: [facility] INVASIVE CV LAB;  Service: Cardiovascular;  Laterality: Right;   LOWER EXTREMITY INTERVENTION Right 08/19/2023   Procedure: LOWER EXTREMITY INTERVENTION;  Surgeon: Sheree Penne Bruckner, MD;  Location: [facility] INVASIVE CV LAB;  Service: Cardiovascular;  Laterality: Right;   PERIPHERAL VASCULAR BALLOON ANGIOPLASTY  03/27/2023   Procedure: PERIPHERAL VASCULAR BALLOON ANGIOPLASTY;  Surgeon: Lanis Fonda BRAVO, MD;  Location: [facility] INVASIVE CV LAB;  Service: Cardiovascular;;  Right AT   TEE WITHOUT CARDIOVERSION  N/A 03/14/2018   Procedure: TRANSESOPHAGEAL ECHOCARDIOGRAM (TEE);  Surgeon: Okey Vina GAILS, MD;  Location: [facility] ENDOSCOPY;  Service: Cardiovascular;  Laterality: N/A;   TEE WITHOUT CARDIOVERSION N/A 02/26/2022   Procedure: TRANSESOPHAGEAL ECHOCARDIOGRAM (TEE);  Surgeon: Gardenia Led, DO;  Location: [facility] ENDOSCOPY;  Service: Cardiovascular;  Laterality: N/A;   TRANSMETATARSAL AMPUTATION Right 08/21/2023   Procedure: AMPUTATION, FOOT,  TRANSMETATARSAL;  Surgeon: Malvin Marsa FALCON, DPM;  Location: [facility] OR;  Service: Orthopedics/Podiatry;  Laterality: Right;  Right foot TMA     Allergies:    Allergies[1]  Social History   Tobacco Use   Smoking status: Every Day    Current packs/day: 0.00    Average packs/day: 2.0 packs/day for 30.0 years (60.0 ttl pk-yrs)    Types: Cigarettes    Start date: 02/09/1988    Last attempt to quit: 02/08/2018    Years since quitting: 6.0   Smokeless tobacco: Never   Tobacco comments:    3 cigarettes daily 02/09/22  Substance Use Topics   Alcohol use: No    Comment: quit 2 years ago     Family History:   Noncontributory  ROS:  All other ROS reviewed and negative. Pertinent positives noted in the HPI.     Physical Exam/Data:   Vitals:   03/05/24 1820 03/05/24 1906 03/05/24 1915 03/05/24 1945  BP:  (!) 83/66 94/79   Pulse:    (!) 110  Resp:  (!) 25 (!) 27 15  Temp: (!) 96.7 F (35.9 C)     TempSrc: Oral     SpO2:    91%  Weight:      Height:       No intake or output data in the 24 hours ending 03/05/24 2006     03/05/2024    6:18 PM 12/10/2023   11:57 AM 11/06/2023    2:03 PM  Last 3 Weights  Weight (lbs) 130 lb 130 lb 137 lb  Weight (kg) 58.968 kg 58.968 kg 62.143 kg    Body mass index is 19.2 kg/m.  General: Well nourished, well developed, in no acute distress HEENT: Atraumatic, no JVD Cardiac: Normal S1, S2; Tachycardic and regular rhythm; no murmurs, rubs, or gallops Lungs:CTAB, no wheezing, rhonchi or rales  Abd: Soft, nontender, no hepatomegaly  Ext: No edema, pulses 2+ Skin: Warm and dry, no rashes     Relevant CV Studies: Reviewed Assessment and Plan:  Joanna [name] is a 64 year old female with history of NICM (40-45% 6/25), PAD s/p stents (7/25) and R TMA, amio-induced hyperthyroidism, Afib on Eliquis  who presented with a flutter with RVR to the 140s in the setting of dehydration.  Her rates have improved with IV fluid.  #Aflutter with  RVR #Hypotension #AKI, likely pre-renal #NICM 40-45% 6/25 #PAD - Presenting with A-flutter with RVR and found to have evidence of significant dehydration.  Her rates have improved with IV fluid.  Per the ED report, IVC was collapsible.  I will recommend at least 2 L of IV fluid.  If rates remain in the 120s, I would recommend monitoring for now.  If patient remain persistently in the 130s and above despite IV fluid, can consider bolus of IV Amio with or without the drip.  Patient has a history of Amio induced hyperthyroidism, but a couple of hours should be okay. - N.p.o. at midnight for possible DCCV in the morning - Will hold GDMT and antiarrhythmics given blood pressure and AKI - Obtain 2D echo in the morning - We will continue  to follow   For questions or updates, please contact [facility] Please consult [email or web address] for contact info under    Signed, Joelle DEL. Ren Ny, MD, [facility]  [facility]  03/05/2024 8:06 PM     [1] No Known Allergies  "

## 2024-03-05 NOTE — Assessment & Plan Note (Signed)
 Cardiology is aware but feels like short-term course of amiodarone  would be beneficial in the situation Check TSH T3-T4 continue Tapazole 

## 2024-03-05 NOTE — Assessment & Plan Note (Signed)
Avoid over aggressive fluid resuscitation monitor fluid status

## 2024-03-05 NOTE — Assessment & Plan Note (Addendum)
 Lokelma  given will repeat and recheck K improved

## 2024-03-05 NOTE — Assessment & Plan Note (Addendum)
 In the setting of decreased p.o. intake check FENa check urine electrolytes strict I's and O's Renal US   Given elevated phosphate level and renal US  findings Patient likely has underlying CKD

## 2024-03-05 NOTE — Assessment & Plan Note (Signed)
 Chronic and stable.

## 2024-03-05 NOTE — ED Triage Notes (Signed)
 Pt bib [facility] from home with complaints of SVT. She has been having chest discomfort for the past week as well as SHOB and weakness. EMS reports HR of 240-270 at times. On arrival pt HR 161. EMS reports she vagels to 125 but HR goes back up.   Denies hx of SVT

## 2024-03-05 NOTE — Assessment & Plan Note (Signed)
 Allow permissive hypertension   in the setting of hypotension

## 2024-03-05 NOTE — H&P (Addendum)
 "   Joanna [name] FMW:[id number] DOB: 11/24/1960 DOA: 03/05/2024     PCP: Vicci Barnie NOVAK, MD   Outpatient Specialists:  CARDS:   Dr. Aleene Passe, MD (Inactive)  Patient arrived to ER on 03/05/24 at 1814 Referred by Attending Bernard Drivers, MD   Patient coming from:    home Lives  With family   Chief Complaint:   Chief Complaint  Patient presents with   Tachycardia    HPI: Joanna [name] is a 64 y.o. female with medical history significant of hypertension paroxysmal atrial fibrillation, nonischemic cardiomyopathy,, PAD status post right foot amputation, history of hypothyroidism,    Presented with  palpitations, light headedness Patient comes with chest discomfort shortness of breath, lightheadedness weakness  Yesterday she had an episode of some palpitations she tried to lay down and sleep it off today she had 2 more episodes at that point she called EMS on their arrival Noted to have tachycardia heart rate 240 to 270s at times On arrival heart rate 161 vagal maneuvers attempted brought it down to 125 but heart rate went back up.  In the ER patient became hypotensive with blood pressure 83/66 Patient did endorse some nausea vomiting diarrhea, mild URI symptoms Cardiology was consulted they felt that initially patient did have an episode of a flutter in the emergency department heart rates of 140 after IV fluids heart rate went down to 120s patient appears to be significantly dehydrated She has known history of systolic heart failure with EF of 40-45% per echogram in June 2025 According to cardiology note patient has history of amiodarone  induced hyperthyroidism  Recommend patient to be n.p.o. postmidnight for possible DCCV in the morning  Peer review of records in November 2023 she had decompensated heart failure thought secondary to worsening A-fib from amiodarone  induced thyrotoxicosis at that time EF was between 25 to 30% but since then echo from June 2025 showed  improvement of EF up to 40-45% mild global hypokinesis   Denies significant ETOH intake   Does smoke  but interested in quitting   Denies marijuana use     Regarding pertinent Chronic problems:    Hyperlipidemia -  on statins Lipitor (atorvastatin )  Lipid Panel     Component Value Date/Time   CHOL 84 12/10/2023 1240   CHOL 176 08/03/2020 0814   TRIG 49 12/10/2023 1240   HDL 36 (L) 12/10/2023 1240   HDL 41 08/03/2020 0814   CHOLHDL 2.3 12/10/2023 1240   VLDL 10 12/10/2023 1240   LDLCALC 38 12/10/2023 1240   LDLCALC 109 (H) 08/03/2020 0814   LABVLDL 26 08/03/2020 0814     HTN on Coreg    chronic CHF diastolic/systolic/ combined -Farxiga  Lasix  spironolactone  last echo  Recent Results (from the past 56199 hours)  ECHOCARDIOGRAM COMPLETE   Collection Time: 07/23/23  3:36 PM  Result Value   S' Lateral 3.90   AV Area VTI 1.70   AV Mean grad 4.0   AV Area mean vel 1.84   Area-P 1/2 2.18   AR max vel 2.07   AV Peak grad 7.6   Ao pk vel 1.38   Est EF 40 - 45%   Narrative      ECHOCARDIOGRAM REPORT        1. Left ventricular ejection fraction, by estimation, is 40 to 45%. The left ventricle has mildly decreased function. The left ventricle demonstrates global hypokinesis. Left ventricular diastolic parameters are consistent with Grade I diastolic  dysfunction (impaired relaxation).  2. Right ventricular  systolic function is normal. The right ventricular size is normal. There is mildly elevated pulmonary artery systolic pressure.  3. Left atrial size was mildly dilated.  4. The mitral valve is normal in structure. Mild mitral valve regurgitation. No evidence of mitral stenosis.  5. The aortic valve is tricuspid. Aortic valve regurgitation is not visualized. No aortic stenosis is present.  6. The inferior vena cava is normal in size with <50% respiratory variability, suggesting right atrial pressure of 8 mmHg.         PAD -CLI in 7/25 with PCI to right leg/foot and right TMA.  She denies claudication symptoms.   Plavix  75mg  daily with peripheral stents in 7/25.   Follows with [facility].  On statin    Hyperthyroidism:   Lab Results  Component Value Date   TSH 4.916 (H) 08/22/2022  Tapazole       A. Fib -   atrial fibrillation CHA2DS2 vas score  3    current  on anticoagulation with  Eliquis ,      -  Rate control:  Currently controlled with  Coreg       Tikosyn      While in ER: Clinical Course as of 03/05/24 2045  Thu Mar 05, 2024  1926 Hydrate. If > 130 amio, NPO at midnight per cards  [DZ]    Clinical Course User Index [DZ] Laurita Sieving, MD         Lab Orders         CBC         Comprehensive metabolic panel         Magnesium          Pro Brain natriuretic peptide         I-stat chem 8, ED (not at [facility], [facility] or [facility])     CXR - Mild left perihilar interstitial reticular densities may be chronic or represent atypical infiltrate. No focal consolidation.    Following Medications were ordered in ER: Medications  amiodarone  (NEXTERONE ) 1.8 mg/mL load via infusion 150 mg (has no administration in time range)    Followed by  amiodarone  (NEXTERONE  PREMIX) 360-4.14 MG/200ML-% (1.8 mg/mL) IV infusion (has no administration in time range)    Followed by  amiodarone  (NEXTERONE  PREMIX) 360-4.14 MG/200ML-% (1.8 mg/mL) IV infusion (has no administration in time range)  sodium chloride  0.9 % bolus 500 mL (0 mLs Intravenous Stopped 03/05/24 1956)  sodium chloride  0.9 % bolus 500 mL (500 mLs Intravenous New Bag/Given 03/05/24 1959)    _______________________________________________________ ER Provider Called:    Cardiology    Dr. Ren They Recommend admit to medicine  Will see in  ER   ED Triage Vitals  Encounter Vitals Group     BP 03/05/24 1819 126/86     Girls Systolic BP Percentile --      Girls Diastolic BP Percentile --      Boys Systolic BP Percentile --      Boys Diastolic BP Percentile --      Pulse Rate 03/05/24 1819 89     Resp 03/05/24  1819 (!) 22     Temp 03/05/24 1820 (!) 96.7 F (35.9 C)     Temp Source 03/05/24 1820 Oral     SpO2 03/05/24 1814 97 %     Weight 03/05/24 1818 130 lb (59 kg)     Height 03/05/24 1818 5' 9 (1.753 m)     Head Circumference --      Peak Flow --  Pain Score 03/05/24 1818 0     Pain Loc --      Pain Education --      Exclude from Growth Chart --   UFJK(75)@     _________________________________________ Significant initial  Findings: Abnormal Labs Reviewed  CBC - Abnormal; Notable for the following components:      Result Value   Platelets 443 (*)    All other components within normal limits  COMPREHENSIVE METABOLIC PANEL WITH GFR - Abnormal; Notable for the following components:   Potassium 5.8 (*)    CO2 17 (*)    Glucose, Bld 102 (*)    BUN 68 (*)    Creatinine, Ser 3.42 (*)    GFR, Estimated 14 (*)    Anion gap 20 (*)    All other components within normal limits  PRO BRAIN NATRIURETIC PEPTIDE - Abnormal; Notable for the following components:   Pro Brain Natriuretic Peptide 8,152.0 (*)    All other components within normal limits  I-STAT CHEM 8, ED - Abnormal; Notable for the following components:   Potassium 5.9 (*)    BUN 77 (*)    Creatinine, Ser 3.80 (*)    Calcium , Ion 1.09 (*)    TCO2 21 (*)    Hemoglobin 16.3 (*)    HCT 48.0 (*)    All other components within normal limits      _______________  Cardiac Panel (last 3 results) Recent Labs    03/05/24 2217  CKTOTAL 70     ECG: Ordered Personally reviewed and interpreted by me showing: HR : 137 Rhythm: Atrial flutter with predominant 2:1 AV block Repol abnrm suggests ischemia, diffuse leads Minimal ST elevation, lateral leads Artifact in lead(s) I II aVR aVL aVF V1 QTC 422  BNP (last 3 results) Recent Labs    12/10/23 1240  BNP 64.9     The recent clinical data is shown below. Vitals:   03/05/24 1906 03/05/24 1915 03/05/24 1945 03/05/24 2032  BP: (!) 83/66 94/79  90/77  Pulse:   (!) 110    Resp: (!) 25 (!) 27 15 19   Temp:      TempSrc:      SpO2:   91% 100%  Weight:      Height:          WBC     Component Value Date/Time   WBC 10.0 03/05/2024 1842   LYMPHSABS 2.5 08/23/2023 0237   MONOABS 0.8 08/23/2023 0237   EOSABS 0.1 08/23/2023 0237   BASOSABS 0.0 08/23/2023 0237     Lactic Acid, Venous    Component Value Date/Time   LATICACIDVEN 1.9 03/05/2024 2217         UA   ordered     VBG ordered   __________________________________________________________ Recent Labs  Lab 03/05/24 1842 03/05/24 1853  NA 136 139  K 5.8* 5.9*  CO2 17*  --   GLUCOSE 102* 97  BUN 68* 77*  CREATININE 3.42* 3.80*  CALCIUM  9.4  --   MG 1.7  --     Cr    Up from baseline see below Lab Results  Component Value Date   CREATININE 2.90 (H) 03/05/2024   CREATININE 3.80 (H) 03/05/2024   CREATININE 3.42 (H) 03/05/2024    Recent Labs  Lab 03/05/24 1842  AST 15  ALT <5  ALKPHOS 88  BILITOT 0.5  PROT 8.1  ALBUMIN 4.0   Lab Results  Component Value Date   CALCIUM  9.4 03/05/2024  PHOS 3.4 03/09/2023       Plt: Lab Results  Component Value Date   PLT 443 (H) 03/05/2024    Recent Labs  Lab 03/05/24 1842 03/05/24 1853  WBC 10.0  --   HGB 14.4 16.3*  HCT 45.9 48.0*  MCV 91.6  --   PLT 443*  --     HG/HCT   stable,     Component Value Date/Time   HGB 16.3 (H) 03/05/2024 1853   HGB 14.4 03/03/2021 0954   HCT 48.0 (H) 03/05/2024 1853   HCT 44.3 03/03/2021 0954   MCV 91.6 03/05/2024 1842   MCV 87 03/03/2021 0954    _______________________________________________ Hospitalist was called for admission for   Atrial fibrillation with RVR  AKI   The following Work up has been ordered so far:  Orders Placed This Encounter  Procedures   DG Chest Portable 1 View   CBC   Comprehensive metabolic panel   Magnesium    Pro Brain natriuretic peptide   ED Cardiac monitoring   Consult to hospitalist   I-stat chem 8, ED (not at [facility], [facility] or [facility])   EKG 12-Lead      OTHER Significant initial  Findings:  labs showing:     DM  labs:  HbA1C: No results for input(s): HGBA1C in the last 8760 hours.     CBG (last 3)  No results for input(s): GLUCAP in the last 72 hours.        Cultures:    Component Value Date/Time   SDES URINE, RANDOM 03/04/2023 1223   SPECREQUEST NONE Reflexed from [id number] 03/04/2023 1223   CULT  03/04/2023 1223    NO GROWTH Performed at [facility], [address], [place], [place] [address]    REPTSTATUS 03/05/2023 FINAL 03/04/2023 1223     Radiological Exams on Admission: DG Chest Portable 1 View Result Date: 03/05/2024 CLINICAL DATA:  Tachycardia. EXAM: PORTABLE CHEST 1 VIEW COMPARISON:  Chest radiograph dated 03/03/2023. FINDINGS: Mild left perihilar interstitial reticular densities may be chronic or represent atypical infiltrate. No focal consolidation, pleural effusion, pneumothorax. Mild cardiomegaly. No acute osseous pathology. IMPRESSION: Mild left perihilar interstitial reticular densities may be chronic or represent atypical infiltrate. No focal consolidation. Electronically Signed   By: Vanetta Chou M.D.   On: 03/05/2024 19:38   _______________________________________________________________________________________________________ Latest  Blood pressure 90/77, pulse (!) 110, temperature (!) 96.7 F (35.9 C), temperature source Oral, resp. rate 19, height 5' 9 (1.753 m), weight 59 kg, last menstrual period 06/10/2011, SpO2 100%.   Vitals  labs and radiology finding personally reviewed  Review of Systems:    Pertinent positives include:  fatigue nausea, vomiting, diarrhea,  Constitutional:  No weight loss, night sweats, Fevers, chills, , weight loss  HEENT:  No headaches, Difficulty swallowing,Tooth/dental problems,Sore throat,  No sneezing, itching, ear ache, nasal congestion, post nasal drip,  Cardio-vascular:  No chest pain, Orthopnea, PND, anasarca, dizziness, palpitations.no  Bilateral lower extremity swelling  GI:  No heartburn, indigestion, abdominal pain,  change in bowel habits, loss of appetite, melena, blood in stool, hematemesis Resp:  no shortness of breath at rest. No dyspnea on exertion, No excess mucus, no productive cough, No non-productive cough, No coughing up of blood.No change in color of mucus.No wheezing. Skin:  no rash or lesions. No jaundice GU:  no dysuria, change in color of urine, no urgency or frequency. No straining to urinate.  No flank pain.  Musculoskeletal:  No joint pain or no joint swelling.  No decreased range of motion. No back pain.  Psych:  No change in mood or affect. No depression or anxiety. No memory loss.  Neuro: no localizing neurological complaints, no tingling, no weakness, no double vision, no gait abnormality, no slurred speech, no confusion  All systems reviewed and apart from HOPI all are negative _______________________________________________________________________________________________ Past Medical History:   Past Medical History:  Diagnosis Date   Atrial fibrillation with RVR (HCC)    Chronic combined systolic (congestive) and diastolic (congestive) heart failure (HCC)    Dyspnea    Goiter    Hypertension    NICM (nonischemic cardiomyopathy) (HCC)    a. 08/2005 Echo: EF 30-35%, mod diff HK. Mild to Mod MR. Mildly dil LA; b. 08/2005 Cath: Nl Cors. Elevated CO w/o shunt; c. 09/2007 Echo: EF 45%. Mild to mod MR; c. 03/2015 Echo: EF 45%, global HK. Gr1 DD. Mild MR. Mildly dil LA.      Past Surgical History:  Procedure Laterality Date   ABDOMINAL AORTOGRAM N/A 08/19/2023   Procedure: ABDOMINAL AORTOGRAM;  Surgeon: Sheree Penne Bruckner, MD;  Location: [facility] INVASIVE CV LAB;  Service: Cardiovascular;  Laterality: N/A;   ABDOMINAL AORTOGRAM W/LOWER EXTREMITY N/A 03/07/2023   Procedure: ABDOMINAL AORTOGRAM W/LOWER EXTREMITY;  Surgeon: Gretta Bruckner PARAS, MD;  Location: [facility] INVASIVE CV LAB;  Service:  Cardiovascular;  Laterality: N/A;   ABDOMINAL AORTOGRAM W/LOWER EXTREMITY N/A 03/27/2023   Procedure: ABDOMINAL AORTOGRAM W/LOWER EXTREMITY;  Surgeon: Lanis Fonda BRAVO, MD;  Location: [facility] INVASIVE CV LAB;  Service: Cardiovascular;  Laterality: N/A;   APPENDECTOMY     BACK SURGERY     CARDIOVERSION N/A 03/14/2018   Procedure: CARDIOVERSION;  Surgeon: Okey Vina GAILS, MD;  Location: [facility] ENDOSCOPY;  Service: Cardiovascular;  Laterality: N/A;   LOWER EXTREMITY ANGIOGRAPHY Right 08/19/2023   Procedure: Lower Extremity Angiography;  Surgeon: Sheree Penne Bruckner, MD;  Location: [facility] INVASIVE CV LAB;  Service: Cardiovascular;  Laterality: Right;   LOWER EXTREMITY INTERVENTION Right 08/19/2023   Procedure: LOWER EXTREMITY INTERVENTION;  Surgeon: Sheree Penne Bruckner, MD;  Location: [facility] INVASIVE CV LAB;  Service: Cardiovascular;  Laterality: Right;   PERIPHERAL VASCULAR BALLOON ANGIOPLASTY  03/27/2023   Procedure: PERIPHERAL VASCULAR BALLOON ANGIOPLASTY;  Surgeon: Lanis Fonda BRAVO, MD;  Location: [facility] INVASIVE CV LAB;  Service: Cardiovascular;;  Right AT   TEE WITHOUT CARDIOVERSION N/A 03/14/2018   Procedure: TRANSESOPHAGEAL ECHOCARDIOGRAM (TEE);  Surgeon: Okey Vina GAILS, MD;  Location: [facility] ENDOSCOPY;  Service: Cardiovascular;  Laterality: N/A;   TEE WITHOUT CARDIOVERSION N/A 02/26/2022   Procedure: TRANSESOPHAGEAL ECHOCARDIOGRAM (TEE);  Surgeon: Gardenia Led, DO;  Location: [facility] ENDOSCOPY;  Service: Cardiovascular;  Laterality: N/A;   TRANSMETATARSAL AMPUTATION Right 08/21/2023   Procedure: AMPUTATION, FOOT, TRANSMETATARSAL;  Surgeon: Malvin Marsa FALCON, DPM;  Location: [facility] OR;  Service: Orthopedics/Podiatry;  Laterality: Right;  Right foot TMA    Social History:  Ambulatory   independently      reports that she has been smoking cigarettes. She started smoking about 36 years ago. She has a 60 pack-year smoking history. She has never used smokeless tobacco. She reports that she does not drink alcohol and  does not use drugs.     Family History:   Family History  Problem Relation Age of Onset   Cancer Mother    Heart disease Father    ______________________________________________________________________________________________ Allergies: Allergies[1]   Prior to Admission medications  Medication Sig Start Date End Date Taking? Authorizing Provider  apixaban  (ELIQUIS ) 5 MG TABS tablet Take 1 tablet (5  mg total) by mouth 2 (two) times daily. 12/03/23   Terra Fairy PARAS, PA-C  atorvastatin  (LIPITOR) 40 MG tablet TAKE 1 TABLET BY MOUTH DAILY 01/15/24   Vicci Barnie NOVAK, MD  carvedilol  (COREG ) 3.125 MG tablet Take 3.125 mg by mouth 2 (two) times daily with a meal.    [provider]  clopidogrel  (PLAVIX ) 75 MG tablet Take 1 tablet (75 mg total) by mouth daily. 04/08/23 07/01/24  Magda Debby SAILOR, MD  cyclobenzaprine  (FLEXERIL ) 5 MG tablet Take 1 tablet (5 mg total) by mouth 3 (three) times daily as needed for muscle spasms. 10/01/23   Standiford, Marsa FALCON, DPM  cyclobenzaprine  (FLEXERIL ) 5 MG tablet Take 1 tablet (5 mg total) by mouth 3 (three) times daily as needed for muscle spasms. 12/10/23   Standiford, Marsa FALCON, DPM  cyclobenzaprine  (FLEXERIL ) 5 MG tablet Take 1 tablet (5 mg total) by mouth 3 (three) times daily as needed for muscle spasms. 01/21/24   Standiford, Marsa FALCON, DPM  dofetilide  (TIKOSYN ) 125 MCG capsule TAKE 1 CAPSULE(125 MCG) BY MOUTH TWICE DAILY 11/12/23   Terra Fairy PARAS, PA-C  ENTRESTO  24-26 MG Take 1 tablet by mouth 2 (two) times daily. 12/10/23   Rolan Ezra RAMAN, MD  FARXIGA  10 MG TABS tablet Take 1 tablet (10 mg total) by mouth daily before breakfast. 12/10/23   Rolan Ezra RAMAN, MD  feeding supplement [facility] IMMUNE HEALTH) LIQD Take 237 mLs by mouth 2 (two) times daily. If she has any    [provider]  furosemide  (LASIX ) 20 MG tablet Take 1 tablet (20 mg total) by mouth daily. 02/04/24 05/04/24  Rolan Ezra RAMAN, MD  gabapentin  (NEURONTIN ) 300  MG capsule Take 1 capsule (300 mg total) by mouth 2 (two) times daily. 12/10/23   Standiford, Marsa FALCON, DPM  gabapentin  (NEURONTIN ) 300 MG capsule Take 1 capsule (300 mg total) by mouth 3 (three) times daily. 01/21/24   Standiford, Marsa FALCON, DPM  methimazole  (TAPAZOLE ) 5 MG tablet TAKE 1 TABLET BY MOUTH DAILY 03/01/24   Vicci Barnie NOVAK, MD  mupirocin  ointment (BACTROBAN ) 2 % Apply 1 Application topically daily. 10/22/23   Standiford, Marsa FALCON, DPM  potassium chloride  (KLOR-CON ) 10 MEQ tablet Take 1 tablet (10 mEq total) by mouth daily. Appt req for refill [id number] 01/20/24   Terra Fairy PARAS, PA-C  spironolactone  (ALDACTONE ) 25 MG tablet TAKE ONE-HALF TABLET BY MOUTH  DAILY 01/15/24   Vicci Barnie NOVAK, MD    ___________________________________________________________________________________________________ Physical Exam:    03/05/2024    8:32 PM 03/05/2024    7:45 PM 03/05/2024    7:15 PM  Vitals with BMI  Systolic 90  94  Diastolic 77  79  Pulse  110     1. General:  in No  Acute distress  * Chronically ill   -appearing  2. Psychological: Alert and   Oriented 3. Head/ENT:  Dry Mucous Membranes                          Head Non traumatic, neck supple                            Poor Dentition 4. SKIN:  decreased Skin turgor,  Skin clean Dry and intact no rash    5. Heart: Regular rate and rhythm no  Murmur, no Rub or gallop 6. Lungs:   no wheezes or crackles   7. Abdomen: Soft,  non-tender, Non distended bowel sounds present 8. Lower extremities: no clubbing, cyanosis, no  edema 9. Neurologically Grossly intact, moving all 4 extremities equally   10. MSK: Normal range of motion    Chart has been reviewed  ______________________________________________________________________________________________  Assessment/Plan  64 y.o. female with medical history significant of hypertension paroxysmal atrial fibrillation, nonischemic cardiomyopathy,, PAD status post right foot  amputation, history of hypothyroidism,   Admitted for   Atrial fibrillation with RVR   AKI  , dehydration   Present on Admission:  Atrial fibrillation with RVR (HCC)  Amiodarone -induced thyroiditis  COPD (chronic obstructive pulmonary disease) (HCC)  Essential hypertension  AKI (acute kidney injury)  Chronic combined systolic and diastolic CHF (congestive heart failure) (HCC)  Dehydration  PAD (peripheral artery disease)  Hyperkalemia     Atrial fibrillation with RVR (HCC) Seen by cardiology they recommend IV fluids and start on amiodarone  drip Will check TSH Continue Eliquis  N.p.o. postmidnight for possible cardioversion  Amiodarone -induced thyroiditis Cardiology is aware but feels like short-term course of amiodarone  would be beneficial in the situation Check TSH T3-T4 continue Tapazole    COPD (chronic obstructive pulmonary disease) (HCC) Chronic and stable  Essential hypertension Allow permissive hypertension   in the setting of hypotension  AKI (acute kidney injury) In the setting of decreased p.o. intake check FENa check urine electrolytes strict I's and O's Renal US   Given elevated phosphate level and renal US  findings Patient likely has underlying CKD    Chronic combined systolic and diastolic CHF (congestive heart failure) (HCC) Avoid overaggressive fluid resuscitation monitor fluid status  Dehydration Will rehydrate and follow fluid status  PAD (peripheral artery disease) Continue Plavix , continue Lipitor 40 mg a day  Hyperkalemia Lokelma  given will repeat and recheck K improved  S/P amputation of foot, right (HCC) Chronic due to PAD   Other plan as per orders.  DVT prophylaxis:  eliquis    Code Status:    Code Status: Prior FULL CODE  as per patient   I had personally discussed CODE STATUS with patient   ACP   none  Family Communication:   Family not at  Bedside   Diet  - npo post midnight   Disposition Plan:     To home once workup is  complete and patient is stable   Following barriers for discharge:                            A.fib w rvr work up is complete                            Electrolytes corrected                                                         Will need consultants to evaluate patient prior to discharge                         Consult Orders  (From admission, onward)           Start     Ordered   03/05/24 2020  Consult to hospitalist  Pg by Elspeth  Once       Provider:  (Not yet assigned)  Question Answer  Comment  Place call to: [facility]   Reason for Consult Admit      03/05/24 2019                               Consults called: cardiology    Admission status:  ED Disposition     ED Disposition  Admit   Condition  --   Comment  Hospital Area: [facility] [[id number]]  Level of Care: Progressive [102]  Admit to Progressive based on following criteria: CARDIOVASCULAR & THORACIC of moderate stability with acute coronary syndrome symptoms/low risk myocardial infarction/hypertensive urgency/arrhythmias/heart failure potentially compromising stability and stable post cardiovascular intervention patients.  May place patient in observation at [facility] or [facility] if equivalent level of care is available:: No  Diagnosis: Atrial fibrillation with RVR [facility]) [[id number]]  Admitting Physician: Ronny Ruddell [[id number]]  Attending Physician: Treyton Slimp [[id number]]           Obs      Level of care          progressive        Blease Quiver 03/06/2024, 12:34 AM    [facility]     after 2 AM please page floor coverage   If 7AM-7PM, please contact the day team taking care of the patient using [email or web address]          [1] No Known Allergies  "

## 2024-03-05 NOTE — Assessment & Plan Note (Signed)
 Continue Plavix , continue Lipitor 40 mg a day

## 2024-03-05 NOTE — Assessment & Plan Note (Signed)
Will rehydrate and follow fluid status °

## 2024-03-05 NOTE — ED Provider Notes (Signed)
 " [place] [facility] Provider Note   CSN: [id number] Arrival date & time: 03/05/24  1814     Patient presents with: Tachycardia   Joanna [name] is a 64 y.o. female with a past medical history notable for paroxysmal atrial fibrillation, HFrEF, hypertension who presents today for evaluation of palpitations.  Patient reports that she has not felt well over the past 2 to 3 days with ongoing nausea and vomiting.  Associated poor p.o. intake.  Has also had intermittent episodes of palpitations and associated lightheadedness.  Her initial episode was approximately 2 days ago however she had felt better in the interim.  Again today had a recurrent episode when trying to get up and use the restroom and ultimately called EMS.  Was initially noted to be tachycardic as well as hypotensive with EMS.  No medications given and route.  HPI     Prior to Admission medications  Medication Sig Start Date End Date Taking? Authorizing Provider  apixaban  (ELIQUIS ) 5 MG TABS tablet Take 1 tablet (5 mg total) by mouth 2 (two) times daily. 12/03/23   Terra Fairy PARAS, PA-C  atorvastatin  (LIPITOR) 40 MG tablet TAKE 1 TABLET BY MOUTH DAILY 01/15/24   Vicci Barnie NOVAK, MD  carvedilol  (COREG ) 3.125 MG tablet Take 3.125 mg by mouth 2 (two) times daily with a meal.    [provider]  clopidogrel  (PLAVIX ) 75 MG tablet Take 1 tablet (75 mg total) by mouth daily. 04/08/23 07/01/24  Magda Debby SAILOR, MD  cyclobenzaprine  (FLEXERIL ) 5 MG tablet Take 1 tablet (5 mg total) by mouth 3 (three) times daily as needed for muscle spasms. 10/01/23   Standiford, Marsa FALCON, DPM  cyclobenzaprine  (FLEXERIL ) 5 MG tablet Take 1 tablet (5 mg total) by mouth 3 (three) times daily as needed for muscle spasms. 12/10/23   Standiford, Marsa FALCON, DPM  cyclobenzaprine  (FLEXERIL ) 5 MG tablet Take 1 tablet (5 mg total) by mouth 3 (three) times daily as needed for muscle spasms. 01/21/24   Standiford,  Marsa FALCON, DPM  dofetilide  (TIKOSYN ) 125 MCG capsule TAKE 1 CAPSULE(125 MCG) BY MOUTH TWICE DAILY 11/12/23   Terra Fairy PARAS, PA-C  ENTRESTO  24-26 MG Take 1 tablet by mouth 2 (two) times daily. 12/10/23   Rolan Ezra RAMAN, MD  FARXIGA  10 MG TABS tablet Take 1 tablet (10 mg total) by mouth daily before breakfast. 12/10/23   Rolan Ezra RAMAN, MD  feeding supplement [facility]MM[facility]) LIQD Take 237 mLs by mouth 2 (two) times daily. If she has any    [provider]  furosemide  (LASIX ) 20 MG tablet Take 1 tablet (20 mg total) by mouth daily. 02/04/24 05/04/24  Rolan Ezra RAMAN, MD  gabapentin  (NEURONTIN ) 300 MG capsule Take 1 capsule (300 mg total) by mouth 2 (two) times daily. 12/10/23   Standiford, Marsa FALCON, DPM  gabapentin  (NEURONTIN ) 300 MG capsule Take 1 capsule (300 mg total) by mouth 3 (three) times daily. 01/21/24   Standiford, Marsa FALCON, DPM  methimazole  (TAPAZOLE ) 5 MG tablet TAKE 1 TABLET BY MOUTH DAILY 03/01/24   Vicci Barnie NOVAK, MD  mupirocin  ointment (BACTROBAN ) 2 % Apply 1 Application topically daily. 10/22/23   Standiford, Marsa FALCON, DPM  potassium chloride  (KLOR-CON ) 10 MEQ tablet Take 1 tablet (10 mEq total) by mouth daily. Appt req for refill [id number] 01/20/24   Terra Fairy PARAS, PA-C  spironolactone  (ALDACTONE ) 25 MG tablet TAKE ONE-HALF TABLET BY MOUTH  DAILY 01/15/24   Vicci Barnie NOVAK,  MD    Allergies: Patient has no known allergies.    Review of Systems  Updated Vital Signs BP 126/86   Pulse 89   Temp (!) 96.7 F (35.9 C) (Oral)   Resp (!) 22   Ht 5' 9 (1.753 m)   Wt 59 kg   LMP 06/10/2011   SpO2 95%   BMI 19.20 kg/m   Physical Exam Constitutional:      Appearance: Normal appearance.  HENT:     Head: Normocephalic.     Right Ear: External ear normal.     Left Ear: External ear normal.     Nose: Nose normal.     Mouth/Throat:     Mouth: Mucous membranes are moist.  Eyes:     Pupils: Pupils are equal, round, and reactive to light.   Cardiovascular:     Rate and Rhythm: Tachycardia present. Rhythm irregular.     Pulses: Normal pulses.  Pulmonary:     Effort: Pulmonary effort is normal.  Abdominal:     General: Abdomen is flat.  Musculoskeletal:        General: Normal range of motion.     Cervical back: Normal range of motion.  Skin:    General: Skin is warm.     Capillary Refill: Capillary refill takes less than 2 seconds.  Neurological:     General: No focal deficit present.     Mental Status: She is alert.  Psychiatric:        Mood and Affect: Mood normal.     (all labs ordered are listed, but only abnormal results are displayed) Labs Reviewed  CBC - Abnormal; Notable for the following components:      Result Value   Platelets 443 (*)    All other components within normal limits  COMPREHENSIVE METABOLIC PANEL WITH GFR - Abnormal; Notable for the following components:   Potassium 5.8 (*)    CO2 17 (*)    Glucose, Bld 102 (*)    BUN 68 (*)    Creatinine, Ser 3.42 (*)    GFR, Estimated 14 (*)    Anion gap 20 (*)    All other components within normal limits  PRO BRAIN NATRIURETIC PEPTIDE - Abnormal; Notable for the following components:   Pro Brain Natriuretic Peptide 8,152.0 (*)    All other components within normal limits  I-STAT CHEM 8, ED - Abnormal; Notable for the following components:   Potassium 5.9 (*)    BUN 77 (*)    Creatinine, Ser 3.80 (*)    Calcium , Ion 1.09 (*)    TCO2 21 (*)    Hemoglobin 16.3 (*)    HCT 48.0 (*)    All other components within normal limits  MAGNESIUM   POTASSIUM  CK  CREATININE, URINE, RANDOM  OSMOLALITY, URINE  OSMOLALITY  SODIUM, URINE, RANDOM  TSH  T4, FREE  T3  LACTIC ACID, PLASMA  LACTIC ACID, PLASMA  URINALYSIS, COMPLETE (UACMP) WITH MICROSCOPIC  BASIC METABOLIC PANEL WITH GFR    EKG: None  Radiology: DG Chest Portable 1 View Result Date: 03/05/2024 CLINICAL DATA:  Tachycardia. EXAM: PORTABLE CHEST 1 VIEW COMPARISON:  Chest radiograph  dated 03/03/2023. FINDINGS: Mild left perihilar interstitial reticular densities may be chronic or represent atypical infiltrate. No focal consolidation, pleural effusion, pneumothorax. Mild cardiomegaly. No acute osseous pathology. IMPRESSION: Mild left perihilar interstitial reticular densities may be chronic or represent atypical infiltrate. No focal consolidation. Electronically Signed   By: Vanetta Shelia HERO.D.  On: 03/05/2024 19:38   Procedures   Medications Ordered in the ED - No data to display  Clinical Course as of 03/05/24 2240  Thu Mar 05, 2024  1926 Hydrate. If > 130 amio, NPO at midnight per cards  [DZ]    Clinical Course User Index [DZ] Laurita Sieving, MD                               Medical Decision Making Amount and/or Complexity of Data Reviewed Labs: ordered. Radiology: ordered.  Risk Prescription drug management. Decision regarding hospitalization.   Patient is a 64 year old female who presents today for evaluation of palpitations in the setting of ongoing nausea and vomiting.  On initial assessment patient was noted to be tachycardic with heart rates in the 140s to 150s.  EKG with evidence of A-fib RVR.  Blood pressure also hypotensive with systolic values in the 80s.  Patient is mentating appropriately at this point in time and does not appear toxic on my bedside assessment.  Physical examination otherwise without any acute abnormalities.  Lungs clear to auscultation bilaterally.  Lower extremities without any significant edema.  I did perform a bedside ultrasound that showed relatively preserved ejection fraction and nonpleuritic IVC.  Given that patient has had ongoing volume losses, defer initial management of her RVR and will fluid resuscitate initially.  Patient was abided 500 cc of fluid for initial volume resuscitation.  In review of her laboratory evaluation, she had an unremarkable CBC however did have a significant AKI with a creatinine of 3.42 as well  as a BUN of 68 compared to a baseline of normal.  BNP also elevated to 8000.  On reassessment patient remained tachycardic in A-fib RVR.  I did consult cardiology for this patient and they kindly evaluate the patient at bedside.  Also felt that her symptoms may be related to AKI and hypovolemia.  Recommended ongoing volume touch station however she did not respond recommended amiodarone  bolus followed by infusion.  While in the emergency department patient did slight improvement of heart rate however maintained consistently in the 130s.  Amiodarone  was subsequent started.  Patient was also admitted to the hospital service for further management of her A-fib as well as AKI.   Final diagnoses:  Atrial fibrillation with RVR (HCC)  AKI (acute kidney injury)    ED Discharge Orders     None          Laurita Sieving, MD 03/05/24 2244    Bernard Drivers, MD 03/07/24 1402  "

## 2024-03-06 ENCOUNTER — Inpatient Hospital Stay (HOSPITAL_COMMUNITY)

## 2024-03-06 ENCOUNTER — Encounter (HOSPITAL_COMMUNITY): Admission: EM | Disposition: A | Payer: Self-pay | Source: Home / Self Care | Attending: Internal Medicine

## 2024-03-06 ENCOUNTER — Observation Stay (HOSPITAL_COMMUNITY)

## 2024-03-06 DIAGNOSIS — I4891 Unspecified atrial fibrillation: Secondary | ICD-10-CM

## 2024-03-06 DIAGNOSIS — R112 Nausea with vomiting, unspecified: Secondary | ICD-10-CM

## 2024-03-06 DIAGNOSIS — Z89431 Acquired absence of right foot: Secondary | ICD-10-CM | POA: Diagnosis not present

## 2024-03-06 DIAGNOSIS — Z720 Tobacco use: Secondary | ICD-10-CM | POA: Diagnosis not present

## 2024-03-06 DIAGNOSIS — Z7901 Long term (current) use of anticoagulants: Secondary | ICD-10-CM | POA: Diagnosis not present

## 2024-03-06 DIAGNOSIS — I959 Hypotension, unspecified: Secondary | ICD-10-CM | POA: Diagnosis present

## 2024-03-06 DIAGNOSIS — E860 Dehydration: Secondary | ICD-10-CM | POA: Diagnosis present

## 2024-03-06 DIAGNOSIS — F32A Depression, unspecified: Secondary | ICD-10-CM | POA: Diagnosis present

## 2024-03-06 DIAGNOSIS — R570 Cardiogenic shock: Secondary | ICD-10-CM | POA: Diagnosis present

## 2024-03-06 DIAGNOSIS — I5023 Acute on chronic systolic (congestive) heart failure: Secondary | ICD-10-CM | POA: Diagnosis not present

## 2024-03-06 DIAGNOSIS — R54 Age-related physical debility: Secondary | ICD-10-CM | POA: Diagnosis present

## 2024-03-06 DIAGNOSIS — I484 Atypical atrial flutter: Secondary | ICD-10-CM | POA: Diagnosis present

## 2024-03-06 DIAGNOSIS — J449 Chronic obstructive pulmonary disease, unspecified: Secondary | ICD-10-CM | POA: Diagnosis present

## 2024-03-06 DIAGNOSIS — I480 Paroxysmal atrial fibrillation: Secondary | ICD-10-CM | POA: Diagnosis present

## 2024-03-06 DIAGNOSIS — I5043 Acute on chronic combined systolic (congestive) and diastolic (congestive) heart failure: Secondary | ICD-10-CM | POA: Diagnosis present

## 2024-03-06 DIAGNOSIS — E875 Hyperkalemia: Secondary | ICD-10-CM | POA: Diagnosis present

## 2024-03-06 DIAGNOSIS — Z79899 Other long term (current) drug therapy: Secondary | ICD-10-CM | POA: Diagnosis not present

## 2024-03-06 DIAGNOSIS — R579 Shock, unspecified: Secondary | ICD-10-CM | POA: Diagnosis not present

## 2024-03-06 DIAGNOSIS — Z560 Unemployment, unspecified: Secondary | ICD-10-CM | POA: Diagnosis not present

## 2024-03-06 DIAGNOSIS — T462X5A Adverse effect of other antidysrhythmic drugs, initial encounter: Secondary | ICD-10-CM | POA: Diagnosis present

## 2024-03-06 DIAGNOSIS — I428 Other cardiomyopathies: Secondary | ICD-10-CM | POA: Diagnosis present

## 2024-03-06 DIAGNOSIS — I130 Hypertensive heart and chronic kidney disease with heart failure and stage 1 through stage 4 chronic kidney disease, or unspecified chronic kidney disease: Secondary | ICD-10-CM | POA: Diagnosis present

## 2024-03-06 DIAGNOSIS — I739 Peripheral vascular disease, unspecified: Secondary | ICD-10-CM | POA: Diagnosis present

## 2024-03-06 DIAGNOSIS — R197 Diarrhea, unspecified: Secondary | ICD-10-CM

## 2024-03-06 DIAGNOSIS — I3139 Other pericardial effusion (noninflammatory): Secondary | ICD-10-CM | POA: Diagnosis present

## 2024-03-06 DIAGNOSIS — F17210 Nicotine dependence, cigarettes, uncomplicated: Secondary | ICD-10-CM | POA: Diagnosis present

## 2024-03-06 DIAGNOSIS — I340 Nonrheumatic mitral (valve) insufficiency: Secondary | ICD-10-CM | POA: Diagnosis present

## 2024-03-06 DIAGNOSIS — Z7902 Long term (current) use of antithrombotics/antiplatelets: Secondary | ICD-10-CM | POA: Diagnosis not present

## 2024-03-06 DIAGNOSIS — E785 Hyperlipidemia, unspecified: Secondary | ICD-10-CM | POA: Diagnosis present

## 2024-03-06 DIAGNOSIS — N179 Acute kidney failure, unspecified: Secondary | ICD-10-CM | POA: Diagnosis present

## 2024-03-06 LAB — COMPREHENSIVE METABOLIC PANEL WITH GFR
ALT: <5 U/L (ref 0–44)
AST: 17 U/L (ref 15–41)
Albumin: 3.8 g/dL (ref 3.5–5.0)
Alkaline Phosphatase: 83 U/L (ref 38–126)
Anion gap: 17 — ABNORMAL HIGH (ref 5–15)
BUN: 71 mg/dL — ABNORMAL HIGH (ref 8–23)
CO2: 19 mmol/L — ABNORMAL LOW (ref 22–32)
Calcium: 9 mg/dL (ref 8.9–10.3)
Chloride: 102 mmol/L (ref 98–111)
Creatinine, Ser: 2.69 mg/dL — ABNORMAL HIGH (ref 0.44–1.00)
GFR, Estimated: 19 mL/min — ABNORMAL LOW
Glucose, Bld: 89 mg/dL (ref 70–99)
Potassium: 4.3 mmol/L (ref 3.5–5.1)
Sodium: 138 mmol/L (ref 135–145)
Total Bilirubin: 0.2 mg/dL (ref 0.0–1.2)
Total Protein: 7.6 g/dL (ref 6.5–8.1)

## 2024-03-06 LAB — ECHOCARDIOGRAM COMPLETE
Est EF: 30
Height: 69 in
MV M vel: 2.74 m/s
MV Peak grad: 30.1 mmHg
S' Lateral: 4 cm
Single Plane A2C EF: 41.6 %
Weight: 2080 [oz_av]

## 2024-03-06 LAB — SODIUM, URINE, RANDOM: Sodium, Ur: 62 mmol/L

## 2024-03-06 LAB — COOXEMETRY PANEL
Carboxyhemoglobin: 0.7 % (ref 0.5–1.5)
Methemoglobin: <0.7 % (ref 0.0–1.5)
O2 Saturation: 55.9 %
Total hemoglobin: 13.3 g/dL (ref 12.0–16.0)

## 2024-03-06 LAB — CREATININE, URINE, RANDOM: Creatinine, Urine: 79 mg/dL

## 2024-03-06 LAB — MRSA NEXT GEN BY PCR, NASAL: MRSA by PCR Next Gen: NOT DETECTED

## 2024-03-06 LAB — CBG MONITORING, ED
Glucose-Capillary: 86 mg/dL (ref 70–99)
Glucose-Capillary: 78 mg/dL (ref 70–99)

## 2024-03-06 LAB — CBC
HCT: 43.4 % (ref 36.0–46.0)
Hemoglobin: 13.4 g/dL (ref 12.0–15.0)
MCH: 28 pg (ref 26.0–34.0)
MCHC: 30.9 g/dL (ref 30.0–36.0)
MCV: 90.8 fL (ref 80.0–100.0)
Platelets: 395 10*3/uL (ref 150–400)
RBC: 4.78 MIL/uL (ref 3.87–5.11)
RDW: 12.5 % (ref 11.5–15.5)
WBC: 7.5 10*3/uL (ref 4.0–10.5)
nRBC: 0 % (ref 0.0–0.2)

## 2024-03-06 LAB — LACTIC ACID, PLASMA
Lactic Acid, Venous: 1.2 mmol/L (ref 0.5–1.9)
Lactic Acid, Venous: 2.1 mmol/L (ref 0.5–1.9)
Lactic Acid, Venous: 1 mmol/L (ref 0.5–1.9)

## 2024-03-06 LAB — URINE DRUG SCREEN
Amphetamines: NEGATIVE
Barbiturates: NEGATIVE
Benzodiazepines: NEGATIVE
Cocaine: NEGATIVE
Fentanyl: NEGATIVE
Methadone Scn, Ur: NEGATIVE
Opiates: NEGATIVE
Tetrahydrocannabinol: NEGATIVE

## 2024-03-06 LAB — BLOOD GAS, VENOUS
Acid-base deficit: 7.6 mmol/L — ABNORMAL HIGH (ref 0.0–2.0)
Bicarbonate: 16 mmol/L — ABNORMAL LOW (ref 20.0–28.0)
O2 Saturation: 100 %
Patient temperature: 36.4
pCO2, Ven: 26 mmHg — ABNORMAL LOW (ref 44–60)
pH, Ven: 7.39 (ref 7.25–7.43)
pO2, Ven: 166 mmHg — ABNORMAL HIGH (ref 32–45)

## 2024-03-06 LAB — MAGNESIUM: Magnesium: 1.6 mg/dL — ABNORMAL LOW (ref 1.7–2.4)

## 2024-03-06 LAB — GLUCOSE, CAPILLARY: Glucose-Capillary: 76 mg/dL (ref 70–99)

## 2024-03-06 LAB — PHOSPHORUS
Phosphorus: 6 mg/dL — ABNORMAL HIGH (ref 2.5–4.6)
Phosphorus: 5.5 mg/dL — ABNORMAL HIGH (ref 2.5–4.6)

## 2024-03-06 MED ORDER — SODIUM CHLORIDE 0.9 % IV SOLN: INTRAVENOUS | Status: AC | PRN

## 2024-03-06 MED ORDER — ATORVASTATIN CALCIUM 40 MG PO TABS
40.0000 mg | ORAL_TABLET | Freq: Every day | ORAL | Status: DC
  Administered 2024-03-06 – 2024-03-09 (×4): 40 mg via ORAL
  Filled 2024-03-06 (×4): qty 1

## 2024-03-06 MED ORDER — CHLORHEXIDINE GLUCONATE CLOTH 2 % EX PADS
6.0000 | MEDICATED_PAD | Freq: Every day | CUTANEOUS | Status: DC
  Administered 2024-03-06 – 2024-03-09 (×4): 6 via TOPICAL

## 2024-03-06 MED ORDER — HYDROCODONE-ACETAMINOPHEN 5-325 MG PO TABS
1.0000 | ORAL_TABLET | ORAL | Status: DC | PRN
  Administered 2024-03-07: 1 via ORAL
  Filled 2024-03-06: qty 1

## 2024-03-06 MED ORDER — SODIUM CHLORIDE 0.9 % IV SOLN: INTRAVENOUS | Status: AC

## 2024-03-06 MED ORDER — NOREPINEPHRINE 4 MG/250ML-% IV SOLN
INTRAVENOUS | Status: AC
  Filled 2024-03-06: qty 250

## 2024-03-06 MED ORDER — METHIMAZOLE 5 MG PO TABS
5.0000 mg | ORAL_TABLET | Freq: Every day | ORAL | Status: DC
  Administered 2024-03-06 – 2024-03-09 (×4): 5 mg via ORAL
  Filled 2024-03-06 (×4): qty 1

## 2024-03-06 MED ORDER — LACTATED RINGERS IV SOLN: INTRAVENOUS | Status: DC

## 2024-03-06 MED ORDER — NOREPINEPHRINE 4 MG/250ML-% IV SOLN
0.0000 ug/min | INTRAVENOUS | Status: DC
  Administered 2024-03-06 (×2): 2 ug/min via INTRAVENOUS
  Administered 2024-03-07: 4 ug/min via INTRAVENOUS
  Filled 2024-03-06: qty 250

## 2024-03-06 MED ORDER — ACETAMINOPHEN 325 MG PO TABS: 650.0000 mg | ORAL_TABLET | Freq: Four times a day (QID) | ORAL | Status: DC | PRN

## 2024-03-06 MED ORDER — ONDANSETRON HCL 4 MG/2ML IJ SOLN: 4.0000 mg | Freq: Four times a day (QID) | INTRAMUSCULAR | Status: DC | PRN

## 2024-03-06 MED ORDER — SODIUM CHLORIDE 0.9 % IV SOLN: 250.0000 mL | INTRAVENOUS | Status: AC

## 2024-03-06 MED ORDER — CLOPIDOGREL BISULFATE 75 MG PO TABS
75.0000 mg | ORAL_TABLET | Freq: Every day | ORAL | Status: DC
  Administered 2024-03-06 – 2024-03-09 (×4): 75 mg via ORAL
  Filled 2024-03-06 (×4): qty 1

## 2024-03-06 MED ORDER — SENNA 8.6 MG PO TABS: 1.0000 | ORAL_TABLET | Freq: Two times a day (BID) | ORAL | Status: DC | PRN

## 2024-03-06 MED ORDER — LORAZEPAM 2 MG/ML IJ SOLN
1.0000 mg | Freq: Once | INTRAMUSCULAR | Status: AC
  Administered 2024-03-06: 1 mg via INTRAVENOUS
  Filled 2024-03-06: qty 1

## 2024-03-06 MED ORDER — MAGNESIUM SULFATE 2 GM/50ML IV SOLN
2.0000 g | Freq: Once | INTRAVENOUS | Status: AC
  Administered 2024-03-06: 2 g via INTRAVENOUS
  Filled 2024-03-06: qty 50

## 2024-03-06 MED ORDER — NICOTINE 7 MG/24HR TD PT24
7.0000 mg | MEDICATED_PATCH | Freq: Every day | TRANSDERMAL | Status: DC
  Filled 2024-03-06 (×4): qty 1

## 2024-03-06 MED ORDER — SODIUM CHLORIDE 0.9 % IV BOLUS
1000.0000 mL | INTRAVENOUS | Status: AC
  Administered 2024-03-06: 1000 mL via INTRAVENOUS

## 2024-03-06 MED ORDER — SODIUM CHLORIDE 0.9 % IV SOLN: INTRAVENOUS | Status: DC

## 2024-03-06 MED ORDER — APIXABAN 5 MG PO TABS
5.0000 mg | ORAL_TABLET | Freq: Two times a day (BID) | ORAL | Status: DC
  Administered 2024-03-06 – 2024-03-09 (×8): 5 mg via ORAL
  Filled 2024-03-06 (×8): qty 1

## 2024-03-06 MED ORDER — POLYETHYLENE GLYCOL 3350 17 G PO PACK: 17.0000 g | PACK | Freq: Every day | ORAL | Status: DC | PRN

## 2024-03-06 MED ORDER — ACETAMINOPHEN 650 MG RE SUPP: 650.0000 mg | Freq: Four times a day (QID) | RECTAL | Status: DC | PRN

## 2024-03-06 MED ORDER — ONDANSETRON HCL 4 MG PO TABS: 4.0000 mg | ORAL_TABLET | Freq: Four times a day (QID) | ORAL | Status: DC | PRN

## 2024-03-06 MED ORDER — MAGNESIUM SULFATE 2 GM/50ML IV SOLN
INTRAVENOUS | Status: AC
  Filled 2024-03-06: qty 50

## 2024-03-06 MED ORDER — GABAPENTIN 300 MG PO CAPS
300.0000 mg | ORAL_CAPSULE | Freq: Every day | ORAL | Status: DC | PRN
  Administered 2024-03-07: 300 mg via ORAL
  Filled 2024-03-06: qty 1

## 2024-03-06 MED ORDER — NOREPINEPHRINE 4 MG/250ML-% IV SOLN: 0.0000 ug/min | INTRAVENOUS | Status: DC

## 2024-03-06 MED ORDER — SODIUM CHLORIDE 0.9 % IV BOLUS
250.0000 mL | Freq: Once | INTRAVENOUS | Status: AC
  Administered 2024-03-06: 250 mL via INTRAVENOUS

## 2024-03-06 MED ORDER — FENTANYL CITRATE (PF) 50 MCG/ML IJ SOSY: 12.5000 ug | PREFILLED_SYRINGE | INTRAMUSCULAR | Status: DC | PRN

## 2024-03-06 NOTE — TOC Initial Note (Signed)
 Transition of Care [facility]) - Initial/Assessment Note    Patient Details  Name: Joanna [name] MRN: [id number] Date of Birth: [birth year]  [facility] CM/SW Contact:    Arlana JINNY Nicholaus ISRAEL Phone Number: [phone] 03/06/2024, 7:11 PM  Clinical Narrative:       7:10 &7:11 PM- HF CSW attempted to reach patients sister by phone twice. Could not leave a VM due to the call being disconnected. Will continue to make attempts.   HF CSW/CM will continue to follow and monitor for dc readiness.                    Patient Goals and CMS Choice            Expected Discharge Plan and Services                                              Prior Living Arrangements/Services                       Activities of Daily Living   ADL Screening (condition at time of admission) Independently performs ADLs?: Yes (appropriate for developmental age) Is the patient deaf or have difficulty hearing?: No Does the patient have difficulty seeing, even when wearing glasses/contacts?: No Does the patient have difficulty concentrating, remembering, or making decisions?: No  Permission Sought/Granted                  Emotional Assessment              Admission diagnosis:  Cardiogenic shock (HCC) [R57.0] AKI (acute kidney injury) [N17.9] Atrial fibrillation with RVR (HCC) [I48.91] Patient Active Problem List   Diagnosis Date Noted   S/P amputation of foot, right (HCC) 03/06/2024   Shock circulatory (HCC) 03/06/2024   Cardiogenic shock (HCC) 03/06/2024   Hyperkalemia 03/05/2024   Gangrene of foot (HCC) 08/20/2023   HFrEF (heart failure with reduced ejection fraction) (HCC) 08/20/2023   Gangrene of right foot (HCC) 08/20/2023   PAD (peripheral artery disease) 08/19/2023   Sepsis (HCC) 03/03/2023   Nerve damage 08/22/2022   Encounter for monitoring dofetilide  therapy 07/16/2022   Persistent atrial fibrillation (HCC) 01/16/2022   Hypercoagulable state  due to persistent atrial fibrillation (HCC) 01/16/2022   Severe mitral regurgitation 01/15/2022   Chronic systolic heart failure (HCC) 01/15/2022   Amiodarone -induced thyroiditis 12/22/2021   Chronic combined systolic and diastolic CHF (congestive heart failure) (HCC) 09/07/2019   Former smoker 03/17/2018   Essential hypertension 03/17/2018   Atrial fibrillation with RVR (HCC) 03/08/2018   Accelerated hypertension 03/08/2018   Hyperglycemia 03/08/2018   Acute systolic (congestive) heart failure (HCC)    Hypertensive urgency 04/11/2015   Dehydration 04/01/2015   AKI (acute kidney injury) 04/01/2015   COPD (chronic obstructive pulmonary disease) (HCC) 04/01/2015   PCP:  Vicci Barnie NOVAK, MD Pharmacy:   [id number] [place], [place] - [address] AT [facility] OF [address] [place] [address] Phone: [phone] Fax: [phone]  [facility]) - [place], [place] - [address] [facility] [address] [place] [address] [place] [address] Phone: [phone] Fax: [phone]  [facility] - [place], [place] - [address] [place] [address] Phone: [phone]  Fax: [phone]  [facility] [address] [place] [address] Phone: [phone] Fax: [phone]  [id number] [place], [place] - [address] AT [facility] [address] [place] [address] Phone: [phone] Fax: [phone]     Social Drivers of Health (SDOH) Social History: SDOH Screenings   Food Insecurity: No Food Insecurity (08/26/2023)  Housing: Unknown (08/26/2023)  Transportation Needs: No Transportation Needs (08/26/2023)  Utilities: Not At Risk (08/26/2023)  Alcohol Screen: Low Risk (07/17/2022)  Depression (PHQ2-9): Low Risk (08/26/2023)  Financial Resource Strain: Low Risk  (07/17/2022)  Physical Activity: Insufficiently Active (07/17/2022)  Social Connections: Moderately Isolated (03/04/2023)  Stress: No Stress Concern Present (07/17/2022)  Tobacco Use: High Risk (01/21/2024)   SDOH Interventions:     Readmission Risk Interventions    08/23/2023   11:57 AM  Readmission Risk Prevention Plan  Post Dischage Appt Complete  Medication Screening Complete  Transportation Screening Complete

## 2024-03-06 NOTE — Assessment & Plan Note (Signed)
 Chronic due to PAD

## 2024-03-06 NOTE — Progress Notes (Signed)
 OT Cancellation Note  Patient Details Name: Joanna [name] MRN: [id number] DOB: 11/16/1960   Cancelled Treatment:    Reason Eval/Treat Not Completed: Medical issues which prohibited therapy;Patient not medically ready (notable hypotension and tachycardia. Per chart, plan for potential DCCV this AM. Will follow up for OT Evaluation as appropriate.)  Aundra Pung D Causey 03/06/2024, 8:03 AM

## 2024-03-06 NOTE — Progress Notes (Signed)
 PT Cancellation Note  Patient Details Name: Joanna [name] MRN: [id number] DOB: 1960-04-13   Cancelled Treatment:    Reason Eval/Treat Not Completed: Patient not medically ready; notable hypotension and tachycardia. Per chart, plan for potential DCCV this AM. Will follow up for PT Evaluation as appropriate.  Tambra Muller, PT, DPT Acute Rehabilitation Services  Personal: Secure Chat Rehab Office: [phone]  Maygen Sirico L Emmery Seiler 03/06/2024, 7:51 AM

## 2024-03-06 NOTE — Progress Notes (Addendum)
"  °  Progress Note  Patient Name: Joanna [name] Date of Encounter: 03/06/2024 [facility] Cardiologist: Aleene Passe, MD (Inactive)   Interval Summary    No complaints this morning other than being hungry.   Vital Signs Vitals:   03/06/24 0530 03/06/24 0655 03/06/24 0700 03/06/24 0800  BP: (!) 76/60  (!) 79/63 (!) 75/54  Pulse: (!) 127  (!) 128 (!) 128  Resp: 14  16 11   Temp:  97.8 F (36.6 C)    TempSrc:  Temporal    SpO2: 100%  100% 100%  Weight:      Height:       No intake or output data in the 24 hours ending 03/06/24 0809    03/05/2024    6:18 PM 12/10/2023   11:57 AM 11/06/2023    2:03 PM  Last 3 Weights  Weight (lbs) 130 lb 130 lb 137 lb  Weight (kg) 58.968 kg 58.968 kg 62.143 kg      Telemetry/ECG   Atrial flutter, rates 120s - Personally Reviewed  Physical Exam  GEN: No acute distress.   Neck: No JVD Cardiac: Tachycardic, no murmurs, rubs, or gallops.  Respiratory: Clear to auscultation bilaterally. GI: Soft, nontender, non-distended  MS: No edema, R TMA  Assessment & Plan    64 y.o. female with a hx of NICM (40-45% 6/25), PAD s/p stents (7/25) and R TMA, amio-induced hyperthyroidism, Afib on Eliquis  who was seen for the evaluation of SVT at the request of Dr. Bernard.   Atrial flutter with RVR -- Presented with atrial flutter with RVR, rates in the 140s.  She was started on IV amiodarone  last evening, though does have a history of Amio induced hyperthyroidism. On methimazole .  Notes indicate that she was previously on dofetilide , she is a poor historian related to her medications but pharmacy notes state that she ran out about a month ago. -- Evaluated last evening by Dr. Ren Ny with plans for DCCV today -- Currently on Eliquis  5 mg twice daily -- Has received 1 L of IV fluids, despite this remains hypotensive with pressures in the 70s systolic.  -- At this time, with her hypotension would defer DCCV as her rates are in the 120s. Avoid  dig with renal function   AKI -- Baseline creatinine less than 1, presents with creatinine of 3.42>> Down to 2.6 after IV fluids. Suspect cardiorenal   Chronic systolic heart failure/NICM Hypotension -- Echo 07/2023 with LVEF of 40 to 45%, mild global hypokinesis, normal RV, mild MR -- GDMT: Has been on carvedilol  3.125 mg twice daily, spironolactone  12.5 mg daily, Entresto  24-26 twice daily, Farxiga  10 mg daily PTA -- Blood pressure noted at 126/86, though has been hypotensive with pressures in the 70s systolic overnight despite IV fluids. Needs IV pressor support, PCCM consult. Have reached out to primary team this morning  -- LA 1.9>>1.2 -- with her hypotension and AKI concerning for worsening LV function concerning for low output  -- repeat echo pending (have asked echo to do next)  PAD -- CLI in 7/25 with PCI to right leg/foot and right TMA  -- on plavix  and statin  For questions or updates, please contact [facility] Please consult [email or web address] for contact info under         Signed, Manuelita Rummer, NP   "

## 2024-03-06 NOTE — Progress Notes (Addendum)
 " Progress Note   Patient: Joanna [name] FMW:[id number] DOB: 1960-11-12 DOA: 03/05/2024     0 DOS: the patient was seen and examined on 03/06/2024   Brief hospital course:  64 y.o. female with medical history significant of hypertension paroxysmal atrial fibrillation, nonischemic cardiomyopathy,, PAD status post right foot amputation, history of hypothyroidism, Presented with palpitations, light headedness, vomiting and diarrhea  Admitted for the management of Afib with RVR, Hypotension, AKI and severe dehydration.   Cardiology on board  Assessment and Plan:  Afib with RVR,POA: On Amiodarone  drip and heart rate is in 120s. -BP on lower side -Initial plan was to do DCCV but that is on hold as per cardiology -f/u TTE -Cardiology on board. Spoke to Dr Jordan and plan is to hold off on DCCV at time and will await TTE results. Also, discussed about upgrading her to ICU status as she has been hypotensive overnight despite IVF. -Continue with IV Hydration as patient is severely dehydrated -On eliquis  5 mg BID   AKI,POA: like pre-renal in the setting of vomiting and diarrhea -Continue with IVF -US  renal didn't show evidence of obstructive nephro/uropathy. -Creatinine was 3.8 on admission, improved to 2.6 today -f/u urine osmolality and sodium  Hyperkalemia,POA: in the setting of AKI, resolved now Continue tele monitoring  Hypomagnesemia: Ordered IV Magnesium  sulfate 2 g.  COPD: No acute issues  Essential hypertension: Avoid any scheduled antihypertensives as she is hypotensive in the setting of dehydration and afib with RVR.  Non-ischemic cardiomyopathy/HFmrEF,POA: EF 40-45% back in 6/25. Repeat TTE is pending HF team will be consulted. Holding aldactone , farxiga  and entresto  in the setting of AKI and hypotension.  PAD s/p stenting (07/25): On statin and plavix   Disposition: Lives at home.  Total number of critical care minutes spent taking care of this patient is 42. This  includes discussions with Cardiologist, Dr Jordan as well as PCCM team.     Subjective: She has been feeling sick for a couple of days. Admits having near passing out episodes, vomiting and diarrhea. Spoke to Cardiologist, Dr Jordan at the bedside as well.  Physical Exam: Vitals:   03/06/24 0530 03/06/24 0655 03/06/24 0700 03/06/24 0800  BP: (!) 76/60  (!) 79/63 (!) 75/54  Pulse: (!) 127  (!) 128 (!) 128  Resp: 14  16 11   Temp:  97.8 F (36.6 C)    TempSrc:  Temporal    SpO2: 100%  100% 100%  Weight:      Height:       Constitutional: NAD, calm, comfortable Eyes: PERRL, lids and conjunctivae normal ENMT: Mucous membranes are dry. Posterior pharynx clear of any exudate or lesions.Normal dentition.  Neck: normal, supple, no masses, no thyromegaly Respiratory: clear to auscultation bilaterally, no wheezing, no crackles. Normal respiratory effort. No accessory muscle use.  Cardiovascular: Irregularly irregular rhythm, tachycardia  Abdomen: no tenderness, no masses palpated. No hepatosplenomegaly. Bowel sounds positive.  Musculoskeletal: no clubbing / cyanosis. No joint deformity upper and lower extremities. Good ROM, no contractures. Normal muscle tone.  Skin: no rashes, lesions, ulcers. No induration Neurologic: CN 2-12 grossly intact. Sensation intact, DTR normal. Strength 5/5 x all 4 extremities.  Psychiatric: Normal judgment and insight. Alert and oriented x 3. Normal mood.   Data Reviewed:  There are no new results to review at this time.  Family Communication: None at the bedside  Disposition: Status is: Inpatient Remains inpatient appropriate because: Aib with RVR, Hypotension  Planned Discharge Destination: Home    Time spent: 13  minutes  Author: Deliliah Room, MD 03/06/2024 10:23 AM  For on call review [email or web address].  "

## 2024-03-06 NOTE — Procedures (Signed)
 Central Venous Catheter Insertion Procedure Note  Joanna [name]  [id number]  05-Nov-1960  Date:03/06/24  Time:2:48 PM   Provider Performing:Kerin Kren Ruthine   Procedure: Insertion of Non-tunneled Central Venous Catheter(36556) with US  guidance (23062)   Indication(s) Medication administration, hemodynamic monitoring, labs  Consent Risks of the procedure as well as the alternatives and risks of each were explained to the patient and/or caregiver.  Consent for the procedure was obtained and is signed in the bedside chart  Anesthesia Topical only with 1% lidocaine    Timeout Verified patient identification, verified procedure, site/side was marked, verified correct patient position, special equipment/implants available, medications/allergies/relevant history reviewed, required imaging and test results available.  Sterile Technique Maximal sterile technique including full sterile barrier drape, hand hygiene, sterile gown, sterile gloves, mask, hair covering, sterile ultrasound probe cover (if used).  Procedure Description Area of catheter insertion was cleaned with chlorhexidine  and draped in sterile fashion.  With real-time ultrasound guidance a central venous catheter was placed into the right internal jugular vein. Nonpulsatile blood flow and easy flushing noted in all ports.  The catheter was sutured in place and sterile dressing applied.   Complications/Tolerance None; patient tolerated the procedure well. Chest X-ray is ordered to verify placement for internal jugular or subclavian cannulation.   Chest x-ray is not ordered for femoral cannulation.  EBL Minimal  Specimen(s) None   Keven Ruthine, PA-C [facility] For pager details, please see AMION or use Epic chat  After 1900, please call [facility] for cross coverage needs 03/06/2024, 2:50 PM

## 2024-03-06 NOTE — ED Notes (Addendum)
 Cardiologist at bedside giving verbal order to DC saline infusion.

## 2024-03-06 NOTE — Procedures (Signed)
 Arterial Catheter Insertion Procedure Note  Joanna [name]  [id number]  [birth year]  Date:03/06/24  Time:2:48 PM    Provider Performing: Ronnald FORBES Gave    Procedure: Insertion of Arterial Line (63379) with US  guidance (23062)   Indication(s) Blood pressure monitoring and/or need for frequent ABGs  Consent Risks of the procedure as well as the alternatives and risks of each were explained to the patient and/or caregiver.  Consent for the procedure was obtained and is signed in the bedside chart  Anesthesia None   Time Out Verified patient identification, verified procedure, site/side was marked, verified correct patient position, special equipment/implants available, medications/allergies/relevant history reviewed, required imaging and test results available.   Sterile Technique Maximal sterile technique including full sterile barrier drape, hand hygiene, sterile gown, sterile gloves, mask, hair covering, sterile ultrasound probe cover (if used).   Procedure Description Area of catheter insertion was cleaned with chlorhexidine  and draped in sterile fashion. With real-time ultrasound guidance an arterial catheter was attempted to the left and right radial arteries without success-- attempted by 2 providers. Small, poorly pulsatile vessels.   Time permitting will re-attempt femoral vs axillary. D/w Advanced heart failure.    Complications/Tolerance Unable to successfully place    EBL Minimal   Specimen(s) None    Ronnald Gave MSN, AGACNP-BC [facility] Pulmonary/Critical Care Medicine 03/06/2024, 2:50 PM

## 2024-03-06 NOTE — Progress Notes (Signed)
" °  Echocardiogram 2D Echocardiogram has been performed.  Koleen KANDICE Popper, RDCS 03/06/2024, 11:13 AM "

## 2024-03-06 NOTE — Procedures (Signed)
 Arterial Catheter Insertion Procedure Note  Kortny Lirette  [id number]  1960/02/16  Date:03/06/24  Time:5:48 PM    Provider Performing: Keven Fila    Procedure: Insertion of Arterial Line (63379) with US  guidance (23062)   Indication(s) Blood pressure monitoring and/or need for frequent ABGs  Consent Risks of the procedure as well as the alternatives and risks of each were explained to the patient and/or caregiver.  Consent for the procedure was obtained and is signed in the bedside chart  Anesthesia Lidocaine     Time Out Verified patient identification, verified procedure, site/side was marked, verified correct patient position, special equipment/implants available, medications/allergies/relevant history reviewed, required imaging and test results available.   Sterile Technique Maximal sterile technique including full sterile barrier drape, hand hygiene, sterile gown, sterile gloves, mask, hair covering, sterile ultrasound probe cover (if used).   Procedure Description Area of catheter insertion was cleaned with chlorhexidine  and draped in sterile fashion. With real-time ultrasound guidance an arterial catheter was placed into the left femoral artery.  Appropriate arterial tracings confirmed on monitor.     Complications/Tolerance None; patient tolerated the procedure well.   EBL Minimal   Specimen(s) None   Keven Fila, PA-C [place] [facility] For pager details, please see AMION or use Epic chat  After 1900, please call [facility] for cross coverage needs 03/06/2024, 5:49 PM

## 2024-03-07 ENCOUNTER — Encounter (HOSPITAL_COMMUNITY): Payer: Self-pay

## 2024-03-07 DIAGNOSIS — I4891 Unspecified atrial fibrillation: Secondary | ICD-10-CM | POA: Diagnosis not present

## 2024-03-07 DIAGNOSIS — N179 Acute kidney failure, unspecified: Secondary | ICD-10-CM | POA: Diagnosis not present

## 2024-03-07 DIAGNOSIS — R197 Diarrhea, unspecified: Secondary | ICD-10-CM | POA: Diagnosis not present

## 2024-03-07 DIAGNOSIS — R112 Nausea with vomiting, unspecified: Secondary | ICD-10-CM | POA: Diagnosis not present

## 2024-03-07 DIAGNOSIS — I5023 Acute on chronic systolic (congestive) heart failure: Secondary | ICD-10-CM | POA: Diagnosis not present

## 2024-03-07 DIAGNOSIS — Z720 Tobacco use: Secondary | ICD-10-CM | POA: Diagnosis not present

## 2024-03-07 LAB — COOXEMETRY PANEL
Carboxyhemoglobin: 2.3 % — ABNORMAL HIGH (ref 0.5–1.5)
Carboxyhemoglobin: 2 % — ABNORMAL HIGH (ref 0.5–1.5)
Methemoglobin: <0.7 % (ref 0.0–1.5)
Methemoglobin: <0.7 % (ref 0.0–1.5)
O2 Saturation: 100 %
O2 Saturation: 83.6 %
Total hemoglobin: 13 g/dL (ref 12.0–16.0)
Total hemoglobin: 12.4 g/dL (ref 12.0–16.0)

## 2024-03-07 LAB — GASTROINTESTINAL PANEL BY PCR, STOOL (REPLACES STOOL CULTURE)

## 2024-03-07 LAB — CBC
HCT: 37.8 % (ref 36.0–46.0)
Hemoglobin: 12 g/dL (ref 12.0–15.0)
MCH: 28.4 pg (ref 26.0–34.0)
MCHC: 31.7 g/dL (ref 30.0–36.0)
MCV: 89.6 fL (ref 80.0–100.0)
Platelets: 321 10*3/uL (ref 150–400)
RBC: 4.22 MIL/uL (ref 3.87–5.11)
RDW: 12.6 % (ref 11.5–15.5)
WBC: 8.3 10*3/uL (ref 4.0–10.5)
nRBC: 0 % (ref 0.0–0.2)

## 2024-03-07 LAB — T3: T3, Total: 55 ng/dL — ABNORMAL LOW (ref 71–180)

## 2024-03-07 LAB — BASIC METABOLIC PANEL WITH GFR
Anion gap: 11 (ref 5–15)
BUN: 54 mg/dL — ABNORMAL HIGH (ref 8–23)
CO2: 21 mmol/L — ABNORMAL LOW (ref 22–32)
Calcium: 8.8 mg/dL — ABNORMAL LOW (ref 8.9–10.3)
Chloride: 109 mmol/L (ref 98–111)
Creatinine, Ser: 1.23 mg/dL — ABNORMAL HIGH (ref 0.44–1.00)
GFR, Estimated: 49 mL/min — ABNORMAL LOW
Glucose, Bld: 111 mg/dL — ABNORMAL HIGH (ref 70–99)
Potassium: 3.9 mmol/L (ref 3.5–5.1)
Sodium: 141 mmol/L (ref 135–145)

## 2024-03-07 MED ORDER — AMIODARONE HCL 200 MG PO TABS
200.0000 mg | ORAL_TABLET | Freq: Two times a day (BID) | ORAL | Status: DC
  Administered 2024-03-07 – 2024-03-09 (×5): 200 mg via ORAL
  Filled 2024-03-07 (×5): qty 1

## 2024-03-07 NOTE — Progress Notes (Signed)
 Patient ID: Joanna [name], female   DOB: March 29, 1960, 64 y.o.   MRN: [id number]     Advanced Heart Failure Rounding Note  Cardiologist: Aleene Passe, MD (Inactive)  [facility] Cardiologist: Dr. Rolan Chief Complaint: Hypotension/shock     CVP 4 today, co-ox not accurate.  SBP in 90s, she is off norepinephrine .  She remains on amiodarone  gtt, in atrial flutter rate around 120 bpm.  Creatinine down to 1.23.   She says that she feels better.   Echo: EF 30%, mild LV dilation, moderate RV dysfunction, mild MR, moderate pericardial effusion without tamponade.    Objective:   Weight Range: 62.1 kg Body mass index is 20.22 kg/m.   Vital Signs:   Temp:  [96.9 F (36.1 C)-98.6 F (37 C)] 98 F (36.7 C) (01/31 0713) Pulse Rate:  [59-148] 125 (01/31 0800) Resp:  [11-23] 18 (01/31 0800) BP: (74-102)/(59-90) 95/72 (01/31 0800) SpO2:  [77 %-100 %] 91 % (01/31 0802) Arterial Line BP: (93-119)/(63-75) 99/70 (01/31 0800) Weight:  [62.1 kg] 62.1 kg (01/31 0500) Last BM Date : 03/07/24  Weight change: Filed Weights   03/05/24 1818 03/07/24 0500  Weight: 59 kg 62.1 kg    Intake/Output:   Intake/Output Summary (Last 24 hours) at 03/07/2024 1038 Last data filed at 03/07/2024 0800 Gross per 24 hour  Intake 2682.56 ml  Output 200 ml  Net 2482.56 ml     Physical Exam   General: Thin, NAD Neck: No JVD, no thyromegaly or thyroid  nodule.  Lungs: Clear to auscultation bilaterally with normal respiratory effort. CV: Nondisplaced PMI.  Heart regular S1/S2, no S3/S4, no murmur.  No peripheral edema.   Abdomen: Soft, nontender, no hepatosplenomegaly, no distention.  Skin: Intact without lesions or rashes.  Neurologic: Alert and oriented x 3.  Psych: Normal affect. Extremities: No clubbing or cyanosis.  HEENT: Normal.   Telemetry   Atypical atrial flutter rate around 120 bpm.   Labs   CBC Recent Labs    03/05/24 1842 03/05/24 1853 03/06/24 0200  WBC 10.0  --  7.5  HGB 14.4 16.3*  13.4  HCT 45.9 48.0* 43.4  MCV 91.6  --  90.8  PLT 443*  --  395   Basic Metabolic Panel Recent Labs    98/70/73 1842 03/05/24 1853 03/05/24 2217 03/06/24 0200 03/07/24 0619  NA 136   < > 138 138 141  K 5.8*   < > 4.8  5.0 4.3 3.9  CL 100   < > 104 102 109  CO2 17*  --  16* 19* 21*  GLUCOSE 102*   < > 94 89 111*  BUN 68*   < > 68* 71* 54*  CREATININE 3.42*   < > 2.90* 2.69* 1.23*  CALCIUM  9.4  --  8.7* 9.0 8.8*  MG 1.7  --   --  1.6*  --   PHOS  --   --  6.0* 5.5*  --    < > = values in this interval not displayed.   Liver Function Tests Recent Labs    03/05/24 1842 03/06/24 0200  AST 15 17  ALT <5 <5  ALKPHOS 88 83  BILITOT 0.5 0.2  PROT 8.1 7.6  ALBUMIN 4.0 3.8   No results for input(s): LIPASE, AMYLASE in the last 72 hours. Cardiac Enzymes Recent Labs    03/05/24 2217  CKTOTAL 70    BNP: BNP (last 3 results) Recent Labs    12/10/23 1240  BNP 64.9    ProBNP (last  3 results) Recent Labs    03/05/24 1842  PROBNP 8,152.0*     D-Dimer No results for input(s): DDIMER in the last 72 hours. Hemoglobin A1C No results for input(s): HGBA1C in the last 72 hours. Fasting Lipid Panel No results for input(s): CHOL, HDL, LDLCALC, TRIG, CHOLHDL, LDLDIRECT in the last 72 hours. Medications:   Scheduled Medications:  apixaban   5 mg Oral BID   atorvastatin   40 mg Oral Daily   Chlorhexidine  Gluconate Cloth  6 each Topical Daily   clopidogrel   75 mg Oral Daily   methimazole   5 mg Oral Daily   nicotine   7 mg Transdermal Daily    Infusions:  sodium chloride      sodium chloride      amiodarone  30 mg/hr (03/07/24 0912)    PRN Medications: Place/Maintain arterial line **AND** sodium chloride , acetaminophen  **OR** acetaminophen , fentaNYL  (SUBLIMAZE ) injection, gabapentin , HYDROcodone -acetaminophen , ondansetron  **OR** ondansetron  (ZOFRAN ) IV, polyethylene glycol, senna  Assessment/Plan   1. Hypotension:  Shock/hypotension noted on  arrival to ER with atypical atrial flutter/RVR. Echo this admission showed EF 30% (lower than prior), mild LV dilation, moderate RV dysfunction, mild MR, moderate pericardial effusion without tamponade.  She has a history of NICM, lower EF could be related to atrial flutter/RVR for an uncertain period of time (does not feel the AFL). She was initially thought to be hypovolemic in setting of nausea/diarrhea, received IVF. SBP in 90s now and off NE. Lactate 1.0 today. CVP < 4. Co-ox not accurate today.  - Repeat co-ox.  - Will need to get her back in NSR, see below.   2. Acute on chronic systolic heart failure: This has been presumed to be a nonischemic cardiomyopathy. Cath in 2007 with no significant CAD.  EF has fluctuated over the years. Probably not a purely tachycardia-mediated cardiomyopathy as EF has not corrected to normal in NSR.  Echo in 6/25 with some improvement, EF 40-45%, mild global HK, RV normal, mild MR.  However, echo this admission in setting of AFL/RVR showed lower EF 30%, mild LV dilation, moderate RV dysfunction, mild MR, moderate pericardial effusion without tamponade. CVP < 4, no co-ox yet. She is off NE.  - She does not need diuresis, would allow po hydration but hold off on further IVF.  - GDMT limited by for now by AKI and hypotension  3. Atrial fibrillation/flutter: Paroxysmal.  Last ECG confirming sinus 11/25. She was not taking Tikosyn  at home, back in AFL with RVR this admission. Started amiodarone  gtt for rate control, HR around 110s today in atypical AFL/RVR. She is getting Eliquis , uncertain how compliant she was at home. Taken off amiodarone  in the past due to concern for amiodarone -induced hyperthyroidism. TSH and free T4 were normal this admission, uncertain if she is taking methimazole  or not at home.  - Tikosyn  may not be a good med for her given poor compliance.  - Will use amiodarone  for now.  Probably send home on amiodarone  but will need to make sure she takes  methimazole  and follows closely with endocrinology  - Need to get her back into NSR due to concern for tachy-mediated worsening of LV function.  Plan eTEE-guided DCCV on Monday when she is stabilized.  4. AKI: Scr baseline 1, up to 3.8 on admit.  Creatinine back down to 1.23. Suspect hypovolemia with nausea/vomiting, also possible low output HF with atypical atrial flutter with RVR.  5. Mitral regurgitation: Severe on 11/23 echo, however only mild on most recent echo in 6/25. Mild on echo  this admission.  6. Hyperthyroidism: Suspect related to amiodarone  use.  Follows with endocrinology.  As above, TSH and free T4 normal.  Not clear if she was taking methimazole  at home.  - Continue amiodarone  gtt for now as above.  - Continue methimazole .  7. PAD: CLI in 7/25 with PCI to right leg/foot and right TMA. She denies claudication symptoms.  - Plavix  75mg  daily with peripheral stents in 7/25.  - Follows with [facility].  - Continue atorvastatin   CRITICAL CARE Performed by: Ezra Shuck  Total critical care time: 40 minutes  Critical care time was exclusive of separately billable procedures and treating other patients.  Critical care was necessary to treat or prevent imminent or life-threatening deterioration.  Critical care was time spent personally by me on the following activities: development of treatment plan with patient and/or surrogate as well as nursing, discussions with consultants, evaluation of patient's response to treatment, examination of patient, obtaining history from patient or surrogate, ordering and performing treatments and interventions, ordering and review of laboratory studies, ordering and review of radiographic studies, pulse oximetry and re-evaluation of patient's condition.   Length of Stay: 1  Ezra Shuck, MD  03/07/2024, 10:38 AM  Advanced Heart Failure Team Pager [phone] (M-F; 7a - 5p)   Please visit [email or web address]: For overnight coverage please call cardiology fellow  first. If fellow not available call Shock/ECMO MD on call.  For ECMO / Mechanical Support (Impella, IABP, LVAD) issues call Shock / ECMO MD on call.

## 2024-03-07 NOTE — Progress Notes (Signed)
 "  NAME:  Joanna [name], MRN:  [id number], DOB:  [birth year], LOS: 1 ADMISSION DATE:  03/05/2024, CONSULTATION DATE:  03/06/2024 REFERRING MD:  Dr.McLean, CHIEF COMPLAINT:  Atrial Fibrillation with RVR   History of Present Illness:  This 64 year old woman has a background of heart failure with improved ejection fraction.  Her EF in June 2025 is 40 to 45%.  She also has a history of paroxysmal atrial fibrillation.  Patient apparently has been feeling unwell and dizzy for the last couple of days.  She had persistent nausea and vomiting along with ongoing diarrhea.  With the symptoms she presented to the ER.  She was found to be in A-fib with RVR in the ED.  Her systolic blood pressure has been low in 80s however she was maintaining a MAP of above 65 persistently.  Given this a bedside echo was done which was concerning for low EF.  Patient was given 2.25 L of IV fluids in the ER.  And 100 cc of LR is ongoing.  She feels slightly better after fluids.  She was also in AKI with creatinine Up to 3.8 on admission.  This has improved to 2.69 with IV fluids administration.  Urine output is not documented.  Patient was originally admitted to hospitalist service however given persistent tachycardia and relative hypotension it was recommended that the patient is admitted to ICU for concern for cardiogenic shock.  Cardiology is already consulted.  Pertinent  Medical History   Past Medical History:  Diagnosis Date   Atrial fibrillation with RVR (HCC)    Chronic combined systolic (congestive) and diastolic (congestive) heart failure (HCC)    Dyspnea    Goiter    Hypertension    NICM (nonischemic cardiomyopathy) (HCC)    a. 08/2005 Echo: EF 30-35%, mod diff HK. Mild to Mod MR. Mildly dil LA; b. 08/2005 Cath: Nl Cors. Elevated CO w/o shunt; c. 09/2007 Echo: EF 45%. Mild to mod MR; c. 03/2015 Echo: EF 45%, global HK. Gr1 DD. Mild MR. Mildly dil LA.     Significant Hospital Events: Including procedures, antibiotic  start and stop dates in addition to other pertinent events   1/29 - Admitted to hospitalist 1/30 - Transferred to ICU from ER as there is concerns for cardiogenic shock. CVL and A-line placed, started on levophed   Interim History / Subjective:   Patient feeling better this AM No nausea, vomiting, or diarrhea. No abdominal pain.  Objective    Blood pressure 97/82, pulse (!) 125, temperature 98 F (36.7 C), temperature source Oral, resp. rate 16, height 5' 9 (1.753 m), weight 62.1 kg, last menstrual period 06/10/2011, SpO2 99%. CVP:  [2 mmHg-17 mmHg] 14 mmHg      Intake/Output Summary (Last 24 hours) at 03/07/2024 0734 Last data filed at 03/07/2024 0600 Gross per 24 hour  Intake 2289.16 ml  Output 200 ml  Net 2089.16 ml   Filed Weights   03/05/24 1818 03/07/24 0500  Weight: 59 kg 62.1 kg    Examination: Physical exam: General: Alert and oriented no acute distress. HEENT: Becker/AT, eyes anicteric.  moist mucus membranes no JVD. Neuro: Alert, awake following commands Chest: Clear to auscultation bilaterally, no wheezes or rhonchi Heart: Tachycardic, no murmurs or gallops Abdomen: Soft, nontender, nondistended, bowel sounds present  Ext: no pedal edema  Resolved problem list   Assessment and Plan  Atrial fibrillation with rapid ventricular rate Concerns for cardiogenic shock History heart failure with improved ejection fraction -echo showing EF 30%, previously in June  2025 EF of 40 to 45% H/o NICM - Continue amiodarone  - Hold home Tikosyn , patient apparently has not been taking for a month - Continue home dose Eliquis  - Hold home dose GDMT - Entresto , Farxiga , Aldactone  - Consider resuming home coreg  vs changing to metoprolol  - continue plavix  - Persistent tachycardia, repeat EKG this AM. Possible cardioversion - Levophed  weaned off  Nausea vomiting diarrhea - GI pathogen panel pending - Clears for now - no further symptoms at this time  Acute Kidney Injury  - Likely  pre-renal -Status post 2.25 L of IV fluids in the ER - Cr improved this AM - Avoid nephrotoxic medications including Entresto   H/O Hyperthyroidism(? 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) - Continue home dose methimazole  - Obtain blood sugars between 140 and 180 - Cardiology okay to continue amiodarone   H/O Tobacco use - Smoker H/O COPD - Counseled to quit smoking  H/o PAD s/p foot amputation - On Plavix   Full Code On Eliquis  Will advance diet as tolerated  Labs   CBC: Recent Labs  Lab 03/05/24 1842 03/05/24 1853 03/06/24 0200  WBC 10.0  --  7.5  HGB 14.4 16.3* 13.4  HCT 45.9 48.0* 43.4  MCV 91.6  --  90.8  PLT 443*  --  395    Basic Metabolic Panel: Recent Labs  Lab 03/05/24 1842 03/05/24 1853 03/05/24 2217 03/06/24 0200 03/07/24 0619  NA 136 139 138 138 141  K 5.8* 5.9* 4.8  5.0 4.3 3.9  CL 100 106 104 102 109  CO2 17*  --  16* 19* 21*  GLUCOSE 102* 97 94 89 111*  BUN 68* 77* 68* 71* 54*  CREATININE 3.42* 3.80* 2.90* 2.69* 1.23*  CALCIUM  9.4  --  8.7* 9.0 8.8*  MG 1.7  --   --  1.6*  --   PHOS  --   --  6.0* 5.5*  --    GFR: Estimated Creatinine Clearance: 45.3 mL/min (A) (by C-G formula based on SCr of 1.23 mg/dL (H)). Recent Labs  Lab 03/05/24 1842 03/05/24 2217 03/06/24 0200 03/06/24 1252 03/06/24 1541  WBC 10.0  --  7.5  --   --   LATICACIDVEN  --  1.9 1.2 2.1* 1.0    Liver Function Tests: Recent Labs  Lab 03/05/24 1842 03/06/24 0200  AST 15 17  ALT <5 <5  ALKPHOS 88 83  BILITOT 0.5 0.2  PROT 8.1 7.6  ALBUMIN 4.0 3.8   No results for input(s): LIPASE, AMYLASE in the last 168 hours. No results for input(s): AMMONIA in the last 168 hours.  ABG    Component Value Date/Time   PHART 7.187 (LL) 02/08/2018 0555   PCO2ART 66.0 (HH) 02/08/2018 0555   PO2ART 185.0 (H) 02/08/2018 0555   HCO3 16.0 (L) 03/06/2024 0112   TCO2 21 (L) 03/05/2024 1853   ACIDBASEDEF 7.6 (H) 03/06/2024 0112   O2SAT 100 03/07/2024 0555      Coagulation Profile: No results for input(s): INR, PROTIME in the last 168 hours.  Cardiac Enzymes: Recent Labs  Lab 03/05/24 2217  CKTOTAL 70    HbA1C: Hemoglobin A1C  Date/Time Value Ref Range Status  04/11/2015 11:03 AM 5.2  Final   Hgb A1c MFr Bld  Date/Time Value Ref Range Status  03/08/2018 12:56 PM 5.4 4.8 - 5.6 % Final    Comment:    (NOTE) Pre diabetes:          5.7%-6.4% Diabetes:              >  6.4% Glycemic control for   <7.0% adults with diabetes   09/15/2007 11:47 AM 5.9 4.6 - 6.0 % Final    Comment:    See lab report for associated comment(s)    CBG: Recent Labs  Lab 03/06/24 0204 03/06/24 0711 03/06/24 1238  GLUCAP 86 78 76    Critical care time: 32 minutes    CRITICAL CARE Performed by: Dorn KATHEE Chill   Total critical care time: 32 minutes  Critical care time was exclusive of separately billable procedures and treating other patients.  Critical care was necessary to treat or prevent imminent or life-threatening deterioration.  Critical care was time spent personally by me on the following activities: development of treatment plan with patient and/or surrogate as well as nursing, discussions with consultants, evaluation of patient's response to treatment, examination of patient, obtaining history from patient or surrogate, ordering and performing treatments and interventions, ordering and review of laboratory studies, ordering and review of radiographic studies, pulse oximetry, re-evaluation of patient's condition and participation in multidisciplinary rounds.  Dorn Chill, MD [place] [facility] Office: [phone]   See Amion for personal pager PCCM on call pager [phone] until 7pm. Please call Elink 7p-7a. [phone]         "

## 2024-03-07 NOTE — Progress Notes (Signed)
 03/07/2024 Elink: okay to give amio PO even with borderline BP and HR

## 2024-03-07 NOTE — Evaluation (Signed)
 Physical Therapy Evaluation Patient Details Name: Joanna [name] MRN: [id number] DOB: 08-22-1960 Today's Date: 03/07/2024  History of Present Illness  Pt is 64 year old presented to [facility] on 03/05/24 for Afib with RVR with concerns for cardiogenic shock. Joanna [name] PMH - rt transmet, PAD, a-fib, CHF, COPD, htn  Clinical Impression  Pt admitted with above diagnosis and presents to PT with functional limitations due to deficits listed below (See PT problem list). Pt needs skilled PT to maximize independence and safety. Pt typically independent at home with her walker. Lives with brother. Expect she will make steady progress as medical status improves and hopefully will be able to return home.          If plan is discharge home, recommend the following:  (If pt reaches close to her prior level and family available)   Can travel by private vehicle        Equipment Recommendations None recommended by PT  Recommendations for Other Services       Functional Status Assessment Patient has had a recent decline in their functional status and demonstrates the ability to make significant improvements in function in a reasonable and predictable amount of time.     Precautions / Restrictions Precautions Precautions: Fall;Other (comment) Precaution/Restrictions Comments: watch HR Restrictions Weight Bearing Restrictions Per Provider Order: No      Mobility  Bed Mobility Overal bed mobility: Needs Assistance Bed Mobility: Supine to Sit     Supine to sit: Min assist     General bed mobility comments: Assist to elevate trunk into sitting    Transfers Overall transfer level: Needs assistance Equipment used: Rolling walker (2 wheels) Transfers: Sit to/from Stand, Bed to chair/wheelchair/BSC Sit to Stand: Min assist   Step pivot transfers: Min assist       General transfer comment: Assist to power up and for balance. Bed to chair with walker with min assist and pt with flexed posture.     Ambulation/Gait               General Gait Details: Did not attempt  Stairs            Wheelchair Mobility     Tilt Bed    Modified Rankin (Stroke Patients Only)       Balance Overall balance assessment: Needs assistance Sitting-balance support: No upper extremity supported, Feet supported Sitting balance-Leahy Scale: Fair     Standing balance support: Bilateral upper extremity supported, During functional activity, Reliant on assistive device for balance Standing balance-Leahy Scale: Poor Standing balance comment: walker and min assist for static standing                             Pertinent Vitals/Pain Pain Assessment Pain Assessment: No/denies pain    Home Living Family/patient expects to be discharged to:: Private residence Living Arrangements: Other relatives (brother) Available Help at Discharge: Family;Available 24 hours/day Type of Home: House Home Access: Level entry       Home Layout: One level Home Equipment: Cane - single Librarian, Academic (2 wheels);BSC/3in1;Shower seat      Prior Function Prior Level of Function : Independent/Modified Independent             Mobility Comments: Uses rolling walker ADLs Comments: Mod I with ADL's     Extremity/Trunk Assessment   Upper Extremity Assessment Upper Extremity Assessment: Defer to OT evaluation    Lower Extremity Assessment Lower Extremity Assessment: Generalized weakness;RLE deficits/detail  RLE Deficits / Details: Shorter leg length vs flexion contracture, transmet amp       Communication   Communication Communication: Impaired Factors Affecting Communication: Hearing impaired    Cognition Arousal: Alert Behavior During Therapy: WFL for tasks assessed/performed   PT - Cognitive impairments: No apparent impairments                         Following commands: Intact       Cueing Cueing Techniques: Verbal cues, Tactile cues     General  Comments General comments (skin integrity, edema, etc.): BP 90's/60-70's. HR 120's at rest and with transfer to chair    Exercises     Assessment/Plan    PT Assessment Patient needs continued PT services  PT Problem List Decreased strength;Decreased balance;Decreased activity tolerance;Decreased mobility       PT Treatment Interventions DME instruction;Gait training;Functional mobility training;Therapeutic activities;Therapeutic exercise;Balance training;Patient/family education    PT Goals (Current goals can be found in the Care Plan section)  Acute Rehab PT Goals Patient Stated Goal: return home PT Goal Formulation: With patient Time For Goal Achievement: 03/21/24 Potential to Achieve Goals: Good    Frequency Min 2X/week     Co-evaluation               AM-PAC PT 6 Clicks Mobility  Outcome Measure Help needed turning from your back to your side while in a flat bed without using bedrails?: A Little Help needed moving from lying on your back to sitting on the side of a flat bed without using bedrails?: A Little Help needed moving to and from a bed to a chair (including a wheelchair)?: A Little Help needed standing up from a chair using your arms (e.g., wheelchair or bedside chair)?: A Little Help needed to walk in hospital room?: Total Help needed climbing 3-5 steps with a railing? : Total 6 Click Score: 14    End of Session   Activity Tolerance: Patient limited by fatigue Patient left: in chair;with call bell/phone within reach;with chair alarm set Nurse Communication: Mobility status;Other (comment) (Purewick out and bed needs to be changed) PT Visit Diagnosis: Unsteadiness on feet (R26.81);Other abnormalities of gait and mobility (R26.89);Muscle weakness (generalized) (M62.81)    Time: 8893-8867 PT Time Calculation (min) (ACUTE ONLY): 26 min   Charges:   PT Evaluation $PT Eval Moderate Complexity: 1 Mod PT Treatments $Therapeutic Activity: 8-22 mins PT  General Charges $$ ACUTE PT VISIT: 1 Visit         [facility] PT Acute Rehabilitation Services Office [phone]   Rodgers ORN [facility] 03/07/2024, 11:48 AM

## 2024-03-07 NOTE — Plan of Care (Signed)

## 2024-03-08 DIAGNOSIS — I5023 Acute on chronic systolic (congestive) heart failure: Secondary | ICD-10-CM | POA: Diagnosis not present

## 2024-03-08 DIAGNOSIS — I4891 Unspecified atrial fibrillation: Secondary | ICD-10-CM | POA: Diagnosis not present

## 2024-03-08 LAB — CBC
HCT: 32 % — ABNORMAL LOW (ref 36.0–46.0)
Hemoglobin: 10.3 g/dL — ABNORMAL LOW (ref 12.0–15.0)
MCH: 28.9 pg (ref 26.0–34.0)
MCHC: 32.2 g/dL (ref 30.0–36.0)
MCV: 89.6 fL (ref 80.0–100.0)
Platelets: 283 10*3/uL (ref 150–400)
RBC: 3.57 MIL/uL — ABNORMAL LOW (ref 3.87–5.11)
RDW: 12.6 % (ref 11.5–15.5)
WBC: 7.3 10*3/uL (ref 4.0–10.5)
nRBC: 0 % (ref 0.0–0.2)

## 2024-03-08 LAB — RENAL FUNCTION PANEL
Albumin: 3.3 g/dL — ABNORMAL LOW (ref 3.5–5.0)
Anion gap: 9 (ref 5–15)
BUN: 43 mg/dL — ABNORMAL HIGH (ref 8–23)
CO2: 22 mmol/L (ref 22–32)
Calcium: 8.7 mg/dL — ABNORMAL LOW (ref 8.9–10.3)
Chloride: 107 mmol/L (ref 98–111)
Creatinine, Ser: 1.03 mg/dL — ABNORMAL HIGH (ref 0.44–1.00)
GFR, Estimated: >60 mL/min
Glucose, Bld: 97 mg/dL (ref 70–99)
Phosphorus: 2.3 mg/dL — ABNORMAL LOW (ref 2.5–4.6)
Potassium: 4.3 mmol/L (ref 3.5–5.1)
Sodium: 139 mmol/L (ref 135–145)

## 2024-03-08 LAB — COOXEMETRY PANEL
Carboxyhemoglobin: 1.7 % — ABNORMAL HIGH (ref 0.5–1.5)
Methemoglobin: <0.7 % (ref 0.0–1.5)
O2 Saturation: 71.5 %
Total hemoglobin: 10.5 g/dL — ABNORMAL LOW (ref 12.0–16.0)

## 2024-03-08 MED ORDER — SPIRONOLACTONE 12.5 MG HALF TABLET
12.5000 mg | ORAL_TABLET | Freq: Every day | ORAL | Status: DC
  Administered 2024-03-08 – 2024-03-09 (×2): 12.5 mg via ORAL
  Filled 2024-03-08 (×2): qty 1

## 2024-03-08 NOTE — Progress Notes (Signed)
CVP reading of 5

## 2024-03-08 NOTE — Plan of Care (Signed)
   Problem: Health Behavior/Discharge Planning: Goal: Ability to manage health-related needs will improve Outcome: Progressing

## 2024-03-08 NOTE — Progress Notes (Signed)
 " PROGRESS NOTE    Joanna [name]  FMW:[id number] DOB: 25-Jun-1960 DOA: 03/05/2024 PCP: Vicci Barnie NOVAK, MD    Brief Narrative:   64 year old with history of CHF with improved EF 45%, paroxysmal A-fib, PAD, hypothyroidism came to the hospital for nausea, vomiting, diarrhea and generally feeling ill.  Also noted to have acute kidney injury.  Patient was admitted to the ICU due to concerns of cardiogenic shock.  A CHF team were consulted and the new echocardiogram showed ejection fraction 30%.  Assessment & Plan:  Acute congestive heart failure with reduced ejection fraction, 30% - It has been presumed this could be secondary to nonischemic cardiomyopathy but potential cause also includes currently tachycardia mediated CM.  GDMT per cardiology team depending on renal function.  Atrial fibrillation, paroxysmal - TFTs were normal during this admission therefore restarted amiodarone  - Cardiology considering TEE cardioversion - Eliquis   Nausea vomiting and diarrhea Acute kidney injury from dehydration - Baseline creatinine 1.0, admission creatinine 3.8 which is improved with fluids - GI symptoms appears to be improving  History of essential hypertension - On Coreg   Peripheral artery disease - Continue Eliquis , Plavix   Hyperthyroidism - Likely secondary to amiodarone .  Continue methimazole    DVT prophylaxis: apixaban  (ELIQUIS ) tablet 5 mg     Code Status: Full Code Family Communication:   Status is: Inpatient Remains inpatient appropriate because: Continue hospital stay   PT Follow up Recs: Home Health Pt1/31/2026 1100  Subjective:  Seen at bedside resting comfortably.  No complaints  Examination:  General exam: Appears calm and comfortable  Respiratory system: Clear to auscultation. Respiratory effort normal. Cardiovascular system: S1 & S2 heard, RRR. No JVD, murmurs, rubs, gallops or clicks. No pedal edema. Gastrointestinal system: Abdomen is nondistended, soft and  nontender. No organomegaly or masses felt. Normal bowel sounds heard. Central nervous system: Alert and oriented. No focal neurological deficits. Extremities: Symmetric 5 x 5 power. Skin: No rashes, lesions or ulcers Psychiatry: Judgement and insight appear normal. Mood & affect appropriate.                Diet Orders (From admission, onward)     Start     Ordered   03/06/24 1503  Diet Heart Room service appropriate? Yes; Fluid consistency: Thin  Diet effective now       Question Answer Comment  Room service appropriate? Yes   Fluid consistency: Thin      03/06/24 1502            Objective: Vitals:   03/07/24 2308 03/08/24 0439 03/08/24 0718 03/08/24 0753  BP: 108/70 103/69 (!) 74/41 96/65  Pulse: 61 75 (!) 55   Resp: 17 (!) 25 10   Temp: 97.6 F (36.4 C)  97.6 F (36.4 C)   TempSrc: Oral Oral Axillary   SpO2: 100% 100%    Weight:  61.1 kg    Height:        Intake/Output Summary (Last 24 hours) at 03/08/2024 1048 Last data filed at 03/08/2024 1000 Gross per 24 hour  Intake 1027.8 ml  Output 950 ml  Net 77.8 ml   Filed Weights   03/05/24 1818 03/07/24 0500 03/08/24 0439  Weight: 59 kg 62.1 kg 61.1 kg    Scheduled Meds:  amiodarone   200 mg Oral BID   apixaban   5 mg Oral BID   atorvastatin   40 mg Oral Daily   Chlorhexidine  Gluconate Cloth  6 each Topical Daily   clopidogrel   75 mg Oral Daily   methimazole   5 mg Oral Daily   nicotine   7 mg Transdermal Daily   Continuous Infusions:  Nutritional status     Body mass index is 19.89 kg/m.  Data Reviewed:   CBC: Recent Labs  Lab 03/05/24 1842 03/05/24 1853 03/06/24 0200 03/07/24 1021 03/08/24 0451  WBC 10.0  --  7.5 8.3 7.3  HGB 14.4 16.3* 13.4 12.0 10.3*  HCT 45.9 48.0* 43.4 37.8 32.0*  MCV 91.6  --  90.8 89.6 89.6  PLT 443*  --  395 321 283   Basic Metabolic Panel: Recent Labs  Lab 03/05/24 1842 03/05/24 1853 03/05/24 2217 03/06/24 0200 03/07/24 0619 03/08/24 0450  NA 136 139  138 138 141 139  K 5.8* 5.9* 4.8  5.0 4.3 3.9 4.3  CL 100 106 104 102 109 107  CO2 17*  --  16* 19* 21* 22  GLUCOSE 102* 97 94 89 111* 97  BUN 68* 77* 68* 71* 54* 43*  CREATININE 3.42* 3.80* 2.90* 2.69* 1.23* 1.03*  CALCIUM  9.4  --  8.7* 9.0 8.8* 8.7*  MG 1.7  --   --  1.6*  --   --   PHOS  --   --  6.0* 5.5*  --  2.3*   GFR: Estimated Creatinine Clearance: 53.2 mL/min (A) (by C-G formula based on SCr of 1.03 mg/dL (H)). Liver Function Tests: Recent Labs  Lab 03/05/24 1842 03/06/24 0200 03/08/24 0450  AST 15 17  --   ALT <5 <5  --   ALKPHOS 88 83  --   BILITOT 0.5 0.2  --   PROT 8.1 7.6  --   ALBUMIN 4.0 3.8 3.3*   No results for input(s): LIPASE, AMYLASE in the last 168 hours. No results for input(s): AMMONIA in the last 168 hours. Coagulation Profile: No results for input(s): INR, PROTIME in the last 168 hours. Cardiac Enzymes: Recent Labs  Lab 03/05/24 2217  CKTOTAL 70   BNP (last 3 results) Recent Labs    03/05/24 1842  PROBNP 8,152.0*   HbA1C: No results for input(s): HGBA1C in the last 72 hours. CBG: Recent Labs  Lab 03/06/24 0204 03/06/24 0711 03/06/24 1238  GLUCAP 86 78 76   Lipid Profile: No results for input(s): CHOL, HDL, LDLCALC, TRIG, CHOLHDL, LDLDIRECT in the last 72 hours. Thyroid  Function Tests: Recent Labs    03/05/24 2217  TSH 2.400  FREET4 1.12   Anemia Panel: No results for input(s): VITAMINB12, FOLATE, FERRITIN, TIBC, IRON, RETICCTPCT in the last 72 hours. Sepsis Labs: Recent Labs  Lab 03/05/24 2217 03/06/24 0200 03/06/24 1252 03/06/24 1541  LATICACIDVEN 1.9 1.2 2.1* 1.0    Recent Results (from the past 240 hours)  MRSA Next Gen by PCR, Nasal     Status: None   Collection Time: 03/06/24 11:34 AM   Specimen: Nasal Mucosa; Nasal Swab  Result Value Ref Range Status   MRSA by PCR Next Gen NOT DETECTED NOT DETECTED Final    Comment: (NOTE) The GeneXpert MRSA Assay (FDA approved for  NASAL specimens only), is one component of a comprehensive MRSA colonization surveillance program. It is not intended to diagnose MRSA infection nor to guide or monitor treatment for MRSA infections. Test performance is not FDA approved in patients less than 6 years old. Performed at [facility], [address], [place], [place] [address]   Gastrointestinal Panel by PCR , Stool     Status: None   Collection Time: 03/06/24 11:48 AM   Specimen: Stool  Result Value Ref Range Status   Campylobacter species NOT DETECTED NOT DETECTED Final   Plesimonas shigelloides NOT DETECTED NOT DETECTED Final   Salmonella species NOT DETECTED NOT DETECTED Final   Yersinia enterocolitica NOT DETECTED NOT DETECTED Final   Vibrio species NOT DETECTED NOT DETECTED Final   Vibrio cholerae NOT DETECTED NOT DETECTED Final   Enteroaggregative E coli (EAEC) NOT DETECTED NOT DETECTED Final   Enteropathogenic E coli (EPEC) NOT DETECTED NOT DETECTED Final   Enterotoxigenic E coli (ETEC) NOT DETECTED NOT DETECTED Final   Shiga like toxin producing E coli (STEC) NOT DETECTED NOT DETECTED Final   Shigella/Enteroinvasive E coli (EIEC) NOT DETECTED NOT DETECTED Final   Cryptosporidium NOT DETECTED NOT DETECTED Final   Cyclospora cayetanensis NOT DETECTED NOT DETECTED Final   Entamoeba histolytica NOT DETECTED NOT DETECTED Final   Giardia lamblia NOT DETECTED NOT DETECTED Final   Adenovirus F40/41 NOT DETECTED NOT DETECTED Final   Astrovirus NOT DETECTED NOT DETECTED Final   Norovirus GI/GII NOT DETECTED NOT DETECTED Final   Rotavirus A NOT DETECTED NOT DETECTED Final   Sapovirus (I, II, IV, and V) NOT DETECTED NOT DETECTED Final    Comment: Performed at [facility], [address], [place], [place] [address]         Radiology Studies: ECHOCARDIOGRAM COMPLETE Result Date: 03/06/2024    ECHOCARDIOGRAM REPORT   Patient Name:   Joanna [name] Date of Exam: 03/06/2024 Medical Rec #:  [id number]        Height:       69.0 in Accession #:    [id number]      Weight:       130.0 lb Date of Birth:  03-12-1960       BSA:          1.720 m Patient Age:    63 years        BP:           75/61 mmHg Patient Gender: F               HR:           123 bpm. Exam Location:  Inpatient Procedure: 2D Echo, Cardiac Doppler and Color Doppler (Both Spectral and Color            Flow Doppler were utilized during procedure). Indications:     Atrial Fibrillation I48.91  History:         Patient has prior history of Echocardiogram examinations.                  HFrEF, COPD and PAD, Arrythmias:Atrial Fibrillation and                  Tachycardia, Signs/Symptoms:Shortness of Breath, Dyspnea and                  Syncope; Risk Factors:Hypertension, Dyslipidemia and Former                  Smoker.  Sonographer:     Koleen Popper RDCS Referring Phys:  VERGIE ALES DOUTOVA Diagnosing Phys: Georganna Archer  Sonographer Comments: Image acquisition challenging due to respiratory motion and Image acquisition challenging due to COPD. IMPRESSIONS  1. Left ventricular ejection fraction, by estimation, is 30%. The left ventricle has severely decreased function. The left ventricle demonstrates global hypokinesis. The left ventricular internal cavity size was mildly dilated. Diastology is indeterminate due to atrial fibrillation.  2. Right ventricular systolic function is moderately reduced.  The right ventricular size is normal. There is normal pulmonary artery systolic pressure.  3. Left atrial size was mildly dilated.  4. Moderate pericardial effusion. The pericardial effusion is circumferential. There is no evidence of cardiac tamponade.  5. The mitral valve is normal in structure. Mild mitral valve regurgitation. No evidence of mitral stenosis.  6. The aortic valve is tricuspid. Aortic valve regurgitation is not visualized. No aortic stenosis is present.  7. The inferior vena cava is normal in size with <50% respiratory variability, suggesting  right atrial pressure of 8 mmHg. Comparison(s): Changes from prior study are noted. Severe LV systolic dysfunction. FINDINGS  Left Ventricle: Left ventricular ejection fraction, by estimation, is 30%. The left ventricle has severely decreased function. The left ventricle demonstrates global hypokinesis. The left ventricular internal cavity size was mildly dilated. There is no left ventricular hypertrophy. Diastology is indeterminate due to atrial fibrillation. Right Ventricle: The right ventricular size is normal. No increase in right ventricular wall thickness. Right ventricular systolic function is moderately reduced. There is normal pulmonary artery systolic pressure. The tricuspid regurgitant velocity is 2.05 m/s, and with an assumed right atrial pressure of 8 mmHg, the estimated right ventricular systolic pressure is 24.8 mmHg. Left Atrium: Left atrial size was mildly dilated. Right Atrium: Right atrial size was normal in size. Pericardium: A moderately sized pericardial effusion is present. The pericardial effusion is circumferential. There is no evidence of cardiac tamponade. Mitral Valve: The mitral valve is normal in structure. Mild mitral valve regurgitation. No evidence of mitral valve stenosis. Tricuspid Valve: The tricuspid valve is normal in structure. Tricuspid valve regurgitation is mild . No evidence of tricuspid stenosis. Aortic Valve: The aortic valve is tricuspid. Aortic valve regurgitation is not visualized. No aortic stenosis is present. Pulmonic Valve: The pulmonic valve was normal in structure. Pulmonic valve regurgitation is not visualized. No evidence of pulmonic stenosis. Aorta: The aortic root is normal in size and structure. Venous: The inferior vena cava is normal in size with less than 50% respiratory variability, suggesting right atrial pressure of 8 mmHg. IAS/Shunts: No atrial level shunt detected by color flow Doppler.  LEFT VENTRICLE PLAX 2D LVIDd:         4.50 cm LVIDs:          4.00 cm LV PW:         0.80 cm LV IVS:        1.00 cm LVOT diam:     1.90 cm LV SV:         26 LV SV Index:   15 LVOT Area:     2.84 cm  LV Volumes (MOD) LV vol d, MOD A2C: 92.1 ml LV vol s, MOD A2C: 53.8 ml LV SV MOD A2C:     38.3 ml RIGHT VENTRICLE            IVC RV Basal diam:  3.10 cm    IVC diam: 1.90 cm RV Mid diam:    2.20 cm RV S prime:     6.60 cm/s TAPSE (M-mode): 1.0 cm LEFT ATRIUM             Index        RIGHT ATRIUM          Index LA diam:        3.50 cm 2.03 cm/m   RA Area:     9.45 cm LA Vol (A2C):   48.9 ml 28.42 ml/m  RA Volume:   17.20 ml 10.00 ml/m  LA Vol (A4C):   62.6 ml 36.39 ml/m LA Biplane Vol: 61.7 ml 35.86 ml/m  AORTIC VALVE LVOT Vmax:   71.80 cm/s LVOT Vmean:  50.900 cm/s LVOT VTI:    0.093 m  AORTA Ao Root diam: 3.20 cm MR Peak grad: 30.1 mmHg   TRICUSPID VALVE MR Vmax:      274.33 cm/s TR Peak grad:   16.8 mmHg                           TR Vmax:        205.00 cm/s                            SHUNTS                           Systemic VTI:  0.09 m                           Systemic Diam: 1.90 cm Georganna Archer Electronically signed by Georganna Archer Signature Date/Time: 03/06/2024/4:55:59 PM    Final (Updated)    DG CHEST PORT 1 VIEW Result Date: 03/06/2024 CLINICAL DATA:  Central line placement EXAM: PORTABLE CHEST 1 VIEW COMPARISON:  03/05/2024 FINDINGS: Single frontal view of the chest demonstrates interval placement of a right internal jugular central venous catheter, tip overlying superior vena cava. Cardiac silhouette is unremarkable. No acute airspace disease, effusion, or pneumothorax. No acute bony abnormalities. IMPRESSION: 1. No complication after right internal jugular catheter placement. 2. No acute intrathoracic process. Electronically Signed   By: Ozell Daring M.D.   On: 03/06/2024 16:13           LOS: 2 days   Time spent= 35 mins    Burgess JAYSON Dare, MD [facility]  If 7PM-7AM, please contact night-coverage  03/08/2024, 10:48 AM  "

## 2024-03-08 NOTE — Progress Notes (Signed)
 SDOH resources on AVS

## 2024-03-08 NOTE — Hospital Course (Addendum)
 Brief Narrative:   64 year old with history of CHF with improved EF 45%, paroxysmal A-fib, PAD, hypothyroidism came to the hospital for nausea, vomiting, diarrhea and generally feeling ill.  Also noted to have acute kidney injury.  Patient was admitted to the ICU due to concerns of cardiogenic shock.  A CHF team were consulted and the new echocardiogram showed ejection fraction 30%.  Assessment & Plan:  Acute congestive heart failure with reduced ejection fraction, 30% - It has been presumed this could be secondary to nonischemic cardiomyopathy but potential cause also includes currently tachycardia mediated CM.  GDMT per cardiology team depending on renal function.  Atrial fibrillation, paroxysmal - TFTs were normal during this admission therefore restarted amiodarone  - Cardiology considering TEE cardioversion - Eliquis   Nausea vomiting and diarrhea Acute kidney injury from dehydration - Baseline creatinine 1.0, admission creatinine 3.8 which is improved with fluids - GI symptoms appears to be improving  History of essential hypertension - On Coreg   Peripheral artery disease - Continue Eliquis , Plavix   Hyperthyroidism - Likely secondary to amiodarone .  Continue methimazole    DVT prophylaxis: apixaban  (ELIQUIS ) tablet 5 mg     Code Status: Full Code Family Communication:   Status is: Inpatient Remains inpatient appropriate because: Continue hospital stay   PT Follow up Recs: Home Health Pt1/31/2026 1100  Subjective:  Seen at bedside resting comfortably.  No complaints  Examination:  General exam: Appears calm and comfortable  Respiratory system: Clear to auscultation. Respiratory effort normal. Cardiovascular system: S1 & S2 heard, RRR. No JVD, murmurs, rubs, gallops or clicks. No pedal edema. Gastrointestinal system: Abdomen is nondistended, soft and nontender. No organomegaly or masses felt. Normal bowel sounds heard. Central nervous system: Alert and oriented. No  focal neurological deficits. Extremities: Symmetric 5 x 5 power. Skin: No rashes, lesions or ulcers Psychiatry: Judgement and insight appear normal. Mood & affect appropriate.

## 2024-03-08 NOTE — Progress Notes (Signed)
 Patient ID: Joanna [name], female   DOB: Nov 17, 1960, 64 y.o.   MRN: [id number]     Advanced Heart Failure Rounding Note  Cardiologist: Aleene Passe, MD (Inactive)  [facility] Cardiologist: Dr. Rolan Chief Complaint: Hypotension/shock     CVP not hooked up today.  SBP in 90s-100s.  She converted back to NSR on amiodarone , HR in 60s currently.  Co-ox 71.5%.   No complaints today.  Wants to go home.   Echo: EF 30%, mild LV dilation, moderate RV dysfunction, mild MR, moderate pericardial effusion without tamponade.    Objective:   Weight Range: 61.1 kg Body mass index is 19.89 kg/m.   Vital Signs:   Temp:  [97.6 F (36.4 C)-98.2 F (36.8 C)] 97.6 F (36.4 C) (02/01 0718) Pulse Rate:  [55-116] 66 (02/01 1134) Resp:  [10-25] 14 (02/01 1134) BP: (74-108)/(41-74) 102/61 (02/01 1134) SpO2:  [97 %-100 %] 98 % (02/01 0718) Arterial Line BP: (98-106)/(68-69) 106/69 (01/31 1400) Weight:  [61.1 kg] 61.1 kg (02/01 0439) Last BM Date : 03/07/24 (icu)  Weight change: Filed Weights   03/05/24 1818 03/07/24 0500 03/08/24 0439  Weight: 59 kg 62.1 kg 61.1 kg    Intake/Output:   Intake/Output Summary (Last 24 hours) at 03/08/2024 1207 Last data filed at 03/08/2024 1000 Gross per 24 hour  Intake 994.5 ml  Output 950 ml  Net 44.5 ml     Physical Exam   General: NAD, thin Neck: No JVD, no thyromegaly or thyroid  nodule.  Lungs: Clear to auscultation bilaterally with normal respiratory effort. CV: Nondisplaced PMI.  Heart regular S1/S2, no S3/S4, no murmur.  No peripheral edema.  Abdomen: Soft, nontender, no hepatosplenomegaly, no distention.  Skin: Intact without lesions or rashes.  Neurologic: Alert and oriented x 3.  Psych: Normal affect. Extremities: S/p right TMA  HEENT: Normal.   Telemetry   NSR 60s, personally reviewed.   Labs   CBC Recent Labs    03/07/24 1021 03/08/24 0451  WBC 8.3 7.3  HGB 12.0 10.3*  HCT 37.8 32.0*  MCV 89.6 89.6  PLT 321 283   Basic Metabolic  Panel Recent Labs    03/05/24 1842 03/05/24 1853 03/06/24 0200 03/07/24 0619 03/08/24 0450  NA 136   < > 138 141 139  K 5.8*   < > 4.3 3.9 4.3  CL 100   < > 102 109 107  CO2 17*   < > 19* 21* 22  GLUCOSE 102*   < > 89 111* 97  BUN 68*   < > 71* 54* 43*  CREATININE 3.42*   < > 2.69* 1.23* 1.03*  CALCIUM  9.4   < > 9.0 8.8* 8.7*  MG 1.7  --  1.6*  --   --   PHOS  --    < > 5.5*  --  2.3*   < > = values in this interval not displayed.   Liver Function Tests Recent Labs    03/05/24 1842 03/06/24 0200 03/08/24 0450  AST 15 17  --   ALT <5 <5  --   ALKPHOS 88 83  --   BILITOT 0.5 0.2  --   PROT 8.1 7.6  --   ALBUMIN 4.0 3.8 3.3*   No results for input(s): LIPASE, AMYLASE in the last 72 hours. Cardiac Enzymes Recent Labs    03/05/24 2217  CKTOTAL 70    BNP: BNP (last 3 results) Recent Labs    12/10/23 1240  BNP 64.9    ProBNP (last  3 results) Recent Labs    03/05/24 1842  PROBNP 8,152.0*     D-Dimer No results for input(s): DDIMER in the last 72 hours. Hemoglobin A1C No results for input(s): HGBA1C in the last 72 hours. Fasting Lipid Panel No results for input(s): CHOL, HDL, LDLCALC, TRIG, CHOLHDL, LDLDIRECT in the last 72 hours. Medications:   Scheduled Medications:  amiodarone   200 mg Oral BID   apixaban   5 mg Oral BID   atorvastatin   40 mg Oral Daily   Chlorhexidine  Gluconate Cloth  6 each Topical Daily   clopidogrel   75 mg Oral Daily   methimazole   5 mg Oral Daily   nicotine   7 mg Transdermal Daily   spironolactone   12.5 mg Oral Daily    Infusions:    PRN Medications: acetaminophen  **OR** acetaminophen , fentaNYL  (SUBLIMAZE ) injection, gabapentin , HYDROcodone -acetaminophen , ondansetron  **OR** ondansetron  (ZOFRAN ) IV, polyethylene glycol, senna  Assessment/Plan   1. Hypotension:  Shock/hypotension noted on arrival to ER with atypical atrial flutter/RVR. Echo this admission showed EF 30% (lower than prior), mild LV  dilation, moderate RV dysfunction, mild MR, moderate pericardial effusion without tamponade.  She has a history of NICM, lower EF could be related to atrial flutter/RVR for an uncertain period of time (does not feel the AFL). She was initially thought to be hypovolemic in setting of nausea/diarrhea, received IVF. SBP in 90s-100s now and off NE. Co-ox 71.5%.  2. Acute on chronic systolic heart failure: This has been presumed to be a nonischemic cardiomyopathy. Cath in 2007 with no significant CAD.  EF has fluctuated over the years. Probably not a purely tachycardia-mediated cardiomyopathy as EF has not corrected to normal in NSR.  Echo in 6/25 with some improvement, EF 40-45%, mild global HK, RV normal, mild MR.  However, echo this admission in setting of AFL/RVR showed lower EF 30%, mild LV dilation, moderate RV dysfunction, mild MR, moderate pericardial effusion without tamponade. CVP not hooked up but she does not look volume overloaded.  Co-ox 71.5%.  GDMT limited for now by AKI and hypotension. - She does not need diuresis.  Set up CVP.  - Will start spironolactone  12.5 daily.  3. Atrial fibrillation/flutter: Paroxysmal.  Last ECG confirming sinus 11/25. She was not taking Tikosyn  at home, back in AFL with RVR this admission. Started amiodarone  gtt for rate control, HR around 110s today in atypical AFL/RVR. She is getting Eliquis , uncertain how compliant she was at home. Taken off amiodarone  in the past due to concern for amiodarone -induced hyperthyroidism. TSH and free T4 were normal this admission, uncertain if she is taking methimazole  or not at home. She converted to NSR on amiodarone  yesterday.  - Tikosyn  may not be a good med for her given poor compliance.  - Will use amiodarone  for now.  Probably send home on amiodarone  but will need to make sure she takes methimazole  and follows closely with endocrinology  4. AKI: Scr baseline 1, up to 3.8 on admit.  Creatinine back down to 1.03. Suspect  hypovolemia with nausea/vomiting, also possible low output HF with atypical atrial flutter with RVR.  5. Mitral regurgitation: Severe on 11/23 echo, however only mild on most recent echo in 6/25. Mild on echo this admission.  6. Hyperthyroidism: Suspect related to amiodarone  use.  Follows with endocrinology.  As above, TSH and free T4 normal.  Not clear if she was taking methimazole  at home.  - Continue amiodarone  for now as above.  - Continue methimazole .  7. PAD: CLI in 7/25 with PCI  to right leg/foot and right TMA. She denies claudication symptoms.  - Plavix  75mg  daily with peripheral stents in 7/25.  - Follows with [facility].  - Continue atorvastatin   Work with PT, hopefully home soon.   Length of Stay: 2  Ezra Shuck, MD  03/08/2024, 12:07 PM  Advanced Heart Failure Team Pager [phone] (M-F; 7a - 5p)   Please visit [email or web address]: For overnight coverage please call cardiology fellow first. If fellow not available call Shock/ECMO MD on call.  For ECMO / Mechanical Support (Impella, IABP, LVAD) issues call Shock / ECMO MD on call.

## 2024-03-09 ENCOUNTER — Other Ambulatory Visit (HOSPITAL_COMMUNITY): Payer: Self-pay

## 2024-03-09 DIAGNOSIS — I4891 Unspecified atrial fibrillation: Secondary | ICD-10-CM | POA: Diagnosis not present

## 2024-03-09 LAB — RENAL FUNCTION PANEL
Albumin: 3.3 g/dL — ABNORMAL LOW (ref 3.5–5.0)
Anion gap: 7 (ref 5–15)
BUN: 30 mg/dL — ABNORMAL HIGH (ref 8–23)
CO2: 23 mmol/L (ref 22–32)
Calcium: 8.9 mg/dL (ref 8.9–10.3)
Chloride: 110 mmol/L (ref 98–111)
Creatinine, Ser: 0.88 mg/dL (ref 0.44–1.00)
GFR, Estimated: >60 mL/min
Glucose, Bld: 93 mg/dL (ref 70–99)
Phosphorus: 2.7 mg/dL (ref 2.5–4.6)
Potassium: 4.4 mmol/L (ref 3.5–5.1)
Sodium: 140 mmol/L (ref 135–145)

## 2024-03-09 LAB — CBC
HCT: 33.6 % — ABNORMAL LOW (ref 36.0–46.0)
Hemoglobin: 10.3 g/dL — ABNORMAL LOW (ref 12.0–15.0)
MCH: 28.1 pg (ref 26.0–34.0)
MCHC: 30.7 g/dL (ref 30.0–36.0)
MCV: 91.8 fL (ref 80.0–100.0)
Platelets: 282 10*3/uL (ref 150–400)
RBC: 3.66 MIL/uL — ABNORMAL LOW (ref 3.87–5.11)
RDW: 12.5 % (ref 11.5–15.5)
WBC: 7 10*3/uL (ref 4.0–10.5)
nRBC: 0 % (ref 0.0–0.2)

## 2024-03-09 LAB — COOXEMETRY PANEL
Carboxyhemoglobin: 1.9 % — ABNORMAL HIGH (ref 0.5–1.5)
Methemoglobin: 0.7 % (ref 0.0–1.5)
O2 Saturation: 75.5 %
Total hemoglobin: 10.7 g/dL — ABNORMAL LOW (ref 12.0–16.0)

## 2024-03-09 MED ORDER — AMIODARONE HCL 200 MG PO TABS
200.0000 mg | ORAL_TABLET | Freq: Every day | ORAL | 0 refills | Status: AC
  Filled 2024-03-09: qty 30, 30d supply, fill #0

## 2024-03-09 MED ORDER — FUROSEMIDE 20 MG PO TABS: 20.0000 mg | ORAL_TABLET | Freq: Every day | ORAL | Status: DC

## 2024-03-09 MED ORDER — CLOPIDOGREL BISULFATE 75 MG PO TABS
75.0000 mg | ORAL_TABLET | Freq: Every day | ORAL | 0 refills | Status: AC
  Filled 2024-03-09: qty 90, 90d supply, fill #0

## 2024-03-09 MED ORDER — DAPAGLIFLOZIN PROPANEDIOL 10 MG PO TABS
10.0000 mg | ORAL_TABLET | Freq: Every day | ORAL | Status: DC
  Administered 2024-03-09: 10 mg via ORAL
  Filled 2024-03-09: qty 1

## 2024-03-09 MED ORDER — AMIODARONE HCL 200 MG PO TABS: 200.0000 mg | ORAL_TABLET | Freq: Every day | ORAL | Status: DC

## 2024-03-09 NOTE — Care Management Important Message (Signed)
 Important Message  Patient Details  Name: Joanna [name] MRN: [id number] Date of Birth: 05/04/1960   Important Message Given:  Yes - Medicare IM     Claretta Deed 03/09/2024, 3:59 PM

## 2024-03-10 ENCOUNTER — Telehealth: Payer: Self-pay

## 2024-03-11 ENCOUNTER — Telehealth: Payer: Self-pay | Admitting: *Deleted

## 2024-03-11 ENCOUNTER — Telehealth: Payer: Self-pay

## 2024-03-11 NOTE — Transitions of Care (Post Inpatient/ED Visit) (Signed)
" ° °  03/11/2024  Name: Joanna [name] MRN: [id number] DOB: Jan 30, 1961  Today's TOC FU Call Status: Today's TOC FU Call Status:: Unsuccessful Call (1st Attempt) Unsuccessful Call (1st Attempt) Date: 03/11/24  Attempted to reach the patient regarding the most recent Inpatient/ED visit.  Follow Up Plan: Additional outreach attempts will be made to reach the patient to complete the Transitions of Care (Post Inpatient/ED visit) call.   Andrea Dimes RN, BSN [place]  [facility] RN Care Manager [phone]  "

## 2024-03-11 NOTE — Transitions of Care (Post Inpatient/ED Visit) (Signed)
 "  03/11/2024  Name: Joanna [name] MRN: [id number] DOB: 1960/08/09  Today's TOC FU Call Status: Today's TOC FU Call Status:: Successful TOC FU Call Completed Unsuccessful Call (1st Attempt) Date: 03/10/24 [facility] FU Call Complete Date: 03/11/24  Patient's Name and Date of Birth confirmed. DOB, Name  Transition Care Management Follow-up Telephone Call Date of Discharge: 03/09/24 Discharge Facility: [facility]) Type of Discharge: Inpatient Admission Primary Inpatient Discharge Diagnosis:: atrial fibrillation with RVR How have you been since you were released from the hospital?: Better (She stated she is feeling a lot better.) Any questions or concerns?: No  Items Reviewed: Did you receive and understand the discharge instructions provided?: Yes Medications obtained,verified, and reconciled?: Yes (Medications Reviewed) (She has all medications and did not have any questions about the med regime.  She also correctly stated the medications that she is to stop taking.) Any new allergies since your discharge?: No Dietary orders reviewed?: Yes Type of Diet Ordered:: heart healthy, low sodium.  Feeding supplement is listed on her AVS and she said she usually orders Ensure Plus  through her insurance and [employer] delivers it; but this month she is not sure she can afford it. Do you have support at home?: Yes People in Home [RPT]: sibling(s) Name of Support/Comfort Primary Source: She lives with her brother and she said her sister will also come by to help her as needed  Medications Reviewed Today: Medications Reviewed Today     Reviewed by Marvis Bradley, RN (Case Manager) on 03/11/24 at 1010  Med List Status: <None>   Medication Order Taking? Sig Documenting Provider Last Dose Status Informant  amiodarone  (PACERONE ) 200 MG tablet [id number]  Take 1 tablet (200 mg total) by mouth daily. Amin, Ankit C, MD  Active   apixaban  (ELIQUIS ) 5 MG TABS tablet [id number] No Take 1 tablet (5 mg total) by  mouth 2 (two) times daily. Terra Fairy PARAS, PA-C 03/04/2024  9:00 PM Active Self, Pharmacy Records  atorvastatin  (LIPITOR) 40 MG tablet [id number] No TAKE 1 TABLET BY MOUTH DAILY Vicci Barnie NOVAK, MD 03/04/2024 Morning Active Self, Pharmacy Records  clopidogrel  (PLAVIX ) 75 MG tablet [id number]  Take 1 tablet (75 mg total) by mouth daily. Caleen Burgess BROCKS, MD  Active   cyclobenzaprine  (FLEXERIL ) 5 MG tablet [id number] No Take 1 tablet (5 mg total) by mouth 3 (three) times daily as needed for muscle spasms. Malvin Marsa FALCON, [place] 03/04/2024 Bedtime Active Self, Pharmacy Records  FARXIGA  10 MG TABS tablet [id number] No Take 1 tablet (10 mg total) by mouth daily before breakfast. Rolan Ezra RAMAN, MD 03/04/2024 Morning Active Self, Pharmacy Records  feeding supplement [facility]) LIQD [id number] No Take 237 mLs by mouth 2 (two) times daily. If she has any  Patient not taking: Reported on 03/05/2024   [provider] Not Taking Active Self, Pharmacy Records  furosemide  (LASIX ) 20 MG tablet [id number] No Take 1 tablet (20 mg total) by mouth daily. Rolan Ezra RAMAN, MD 03/04/2024 Morning Active Self, Pharmacy Records  gabapentin  (NEURONTIN ) 300 MG capsule [id number] No Take 1 capsule (300 mg total) by mouth 3 (three) times daily.  Patient taking differently: Take 300 mg by mouth daily as needed (for foot pain).   Malvin Marsa FALCON, [place] 03/04/2024 Morning Active Self, Pharmacy Records  methimazole  (TAPAZOLE ) 5 MG tablet [id number] No TAKE 1 TABLET BY MOUTH DAILY Vicci Barnie NOVAK, MD 03/04/2024 Morning Active Self, Pharmacy Records  potassium chloride  (KLOR-CON ) 10 MEQ tablet [id number] No Take 1 tablet (10  mEq total) by mouth daily. Appt req for refill [id number] Terra Fairy PARAS, PA-C 03/04/2024 Morning Active Self, Pharmacy Records  spironolactone  (ALDACTONE ) 25 MG tablet [id number] No TAKE ONE-HALF TABLET BY MOUTH  DAILY Vicci Barnie NOVAK, MD 03/04/2024 Morning Active Self, Pharmacy Records             Home Care and Equipment/Supplies: Were Home Health Services Ordered?: Yes Name of Home Health Agency:: [facility] Has Agency set up a time to come to your home?: No EMR reviewed for Home Health Orders: Orders present/patient has not received call (refer to CM for follow-up) (She doesn't need think she needs that help I told her I will call [facility] to check on referral.I spoke to Jenna/ [facility] who confirmed receipt of referral and said they called patient yesterday and left message and they will continue to try to reach her) Any new equipment or medical supplies ordered?: No  Functional Questionnaire: Do you need assistance with bathing/showering or dressing?: No (She said her sister can help if needed) Do you need assistance with meal preparation?: No (her brother assists if needed) Do you need assistance with eating?: No Do you have difficulty maintaining continence: No Do you need assistance with getting out of bed/getting out of a chair/moving?: Yes (She has a RW but said she does not always use it) Do you have difficulty managing or taking your medications?: No  Follow up appointments reviewed: PCP Follow-up appointment confirmed?: Yes Date of PCP follow-up appointment?: 03/19/24 Follow-up Provider: [facility] Follow-up appointment confirmed?: Yes Date of Specialist follow-up appointment?: 03/17/24 Follow-Up Specialty Provider:: [facility] Do you need transportation to your follow-up appointment?: No Do you understand care options if your condition(s) worsen?: Yes-patient verbalized understanding    SIGNATURE Slater Diesel, RN   "

## 2024-03-12 ENCOUNTER — Telehealth: Payer: Self-pay

## 2024-03-12 NOTE — Transitions of Care (Post Inpatient/ED Visit) (Signed)
" ° °  03/12/2024  Name: Joanna [name] MRN: [id number] DOB: [birth year]  Today's TOC FU Call Status: Today's TOC FU Call Status:: Unsuccessful Call (2nd Attempt) Unsuccessful Call (2nd Attempt) Date: 03/12/24  Attempted to reach the patient regarding the most recent Inpatient/ED visit.  Follow Up Plan: Additional outreach attempts will be made to reach the patient to complete the Transitions of Care (Post Inpatient/ED visit) call.   Shona Prow RN, CCM [place]  [facility] RN Care Manager [phone]  "

## 2024-03-17 ENCOUNTER — Ambulatory Visit (HOSPITAL_COMMUNITY)

## 2024-03-19 ENCOUNTER — Ambulatory Visit: Admitting: Internal Medicine

## 2024-07-21 ENCOUNTER — Ambulatory Visit: Admitting: Podiatry
